# Patient Record
Sex: Male | Born: 1960 | Race: White | Hispanic: No | Marital: Single | Smoking: Light tobacco smoker
Health system: Southern US, Community
[De-identification: ages and names within clinical notes are randomized; demographics above are authoritative.]

## PROBLEM LIST (undated history)

## (undated) DIAGNOSIS — N183 Chronic kidney disease, stage 3 unspecified: Secondary | ICD-10-CM

## (undated) DIAGNOSIS — I639 Cerebral infarction, unspecified: Secondary | ICD-10-CM

## (undated) DIAGNOSIS — E119 Type 2 diabetes mellitus without complications: Secondary | ICD-10-CM

## (undated) DIAGNOSIS — Z720 Tobacco use: Secondary | ICD-10-CM

## (undated) DIAGNOSIS — Z86718 Personal history of other venous thrombosis and embolism: Secondary | ICD-10-CM

## (undated) DIAGNOSIS — I739 Peripheral vascular disease, unspecified: Secondary | ICD-10-CM

## (undated) DIAGNOSIS — Z87442 Personal history of urinary calculi: Secondary | ICD-10-CM

## (undated) DIAGNOSIS — R569 Unspecified convulsions: Secondary | ICD-10-CM

## (undated) DIAGNOSIS — I2510 Atherosclerotic heart disease of native coronary artery without angina pectoris: Secondary | ICD-10-CM

## (undated) DIAGNOSIS — E785 Hyperlipidemia, unspecified: Secondary | ICD-10-CM

## (undated) DIAGNOSIS — I219 Acute myocardial infarction, unspecified: Secondary | ICD-10-CM

## (undated) HISTORY — PX: STENT PLACEMENT VASCULAR (ARMC HX): HXRAD1737

## (undated) HISTORY — PX: FOOT SURGERY: SHX648

## (undated) HISTORY — DX: Peripheral vascular disease, unspecified: I73.9

## (undated) HISTORY — DX: Personal history of other venous thrombosis and embolism: Z86.718

## (undated) HISTORY — PX: TONSILLECTOMY: SUR1361

---

## 2005-08-13 ENCOUNTER — Encounter: Admission: RE | Admit: 2005-08-13 | Discharge: 2005-11-11 | Payer: Self-pay | Admitting: Internal Medicine

## 2007-06-26 DIAGNOSIS — I219 Acute myocardial infarction, unspecified: Secondary | ICD-10-CM

## 2007-06-26 HISTORY — DX: Acute myocardial infarction, unspecified: I21.9

## 2008-03-05 HISTORY — PX: CORONARY ANGIOPLASTY: SHX604

## 2012-06-25 HISTORY — PX: PERIPHERAL ARTERIAL STENT GRAFT: SHX2220

## 2017-02-10 ENCOUNTER — Encounter (HOSPITAL_COMMUNITY): Payer: Self-pay | Admitting: Emergency Medicine

## 2017-02-10 ENCOUNTER — Emergency Department (HOSPITAL_COMMUNITY)
Admission: EM | Admit: 2017-02-10 | Discharge: 2017-02-11 | Disposition: A | Discharge status: Home / Self Care | Payer: Managed Care, Other (non HMO) | Attending: Emergency Medicine | Admitting: Emergency Medicine

## 2017-02-10 ENCOUNTER — Emergency Department (HOSPITAL_COMMUNITY): Payer: Managed Care, Other (non HMO)

## 2017-02-10 DIAGNOSIS — M79671 Pain in right foot: Secondary | ICD-10-CM | POA: Diagnosis present

## 2017-02-10 DIAGNOSIS — F17210 Nicotine dependence, cigarettes, uncomplicated: Secondary | ICD-10-CM | POA: Diagnosis not present

## 2017-02-10 DIAGNOSIS — L97519 Non-pressure chronic ulcer of other part of right foot with unspecified severity: Secondary | ICD-10-CM | POA: Diagnosis not present

## 2017-02-10 DIAGNOSIS — G8929 Other chronic pain: Secondary | ICD-10-CM

## 2017-02-10 DIAGNOSIS — E08621 Diabetes mellitus due to underlying condition with foot ulcer: Secondary | ICD-10-CM | POA: Insufficient documentation

## 2017-02-10 HISTORY — DX: Type 2 diabetes mellitus without complications: E11.9

## 2017-02-10 LAB — CBC WITH DIFFERENTIAL/PLATELET
BASOS ABS: 0.1 10*3/uL (ref 0.0–0.1)
BASOS PCT: 1 %
EOS ABS: 0.5 10*3/uL (ref 0.0–0.7)
Eosinophils Relative: 5 %
HCT: 40.8 % (ref 39.0–52.0)
HEMOGLOBIN: 14.5 g/dL (ref 13.0–17.0)
Lymphocytes Relative: 26 %
Lymphs Abs: 2.8 10*3/uL (ref 0.7–4.0)
MCH: 31.3 pg (ref 26.0–34.0)
MCHC: 35.5 g/dL (ref 30.0–36.0)
MCV: 87.9 fL (ref 78.0–100.0)
Monocytes Absolute: 0.9 10*3/uL (ref 0.1–1.0)
Monocytes Relative: 8 %
NEUTROS ABS: 6.3 10*3/uL (ref 1.7–7.7)
NEUTROS PCT: 60 %
Platelets: 308 10*3/uL (ref 150–400)
RBC: 4.64 MIL/uL (ref 4.22–5.81)
RDW: 14.6 % (ref 11.5–15.5)
WBC: 10.6 10*3/uL — AB (ref 4.0–10.5)

## 2017-02-10 LAB — BASIC METABOLIC PANEL
ANION GAP: 8 (ref 5–15)
BUN: 23 mg/dL — ABNORMAL HIGH (ref 6–20)
CALCIUM: 9.1 mg/dL (ref 8.9–10.3)
CO2: 27 mmol/L (ref 22–32)
CREATININE: 1.45 mg/dL — AB (ref 0.61–1.24)
Chloride: 103 mmol/L (ref 101–111)
GFR, EST NON AFRICAN AMERICAN: 53 mL/min — AB (ref >60)
Glucose, Bld: 143 mg/dL — ABNORMAL HIGH (ref 65–99)
Potassium: 3.8 mmol/L (ref 3.5–5.1)
SODIUM: 138 mmol/L (ref 135–145)

## 2017-02-10 NOTE — ED Provider Notes (Signed)
[facility]-EMER[facility]ENCY DEPT Provider Note   CSN: [id number] Arrival date & time: 02/10/17  2023     History   Chief Complaint Chief Complaint  Patient presents with  . Foot Pain    HPI Curtis Bradford is a 56 y.o. male.  The history is provided by the patient and medical records. No language interpreter was used.   Curtis Bradford is a 56 y.o. male  with a PMH of DM who presents to the Emergency Department complaining of wound to his right foot. Patient has an extensive history with this right foot. He moved to [place] from [place] two days ago. He was followed by ID, wound care and orthopedics for the foot in [place]. He has an appointment with ortho on 8/23 here in [place] scheduled. He had his first surgery back in February and was seeing wound care once a week. On August 1st, he had another orthopedic surgery to the foot where he states a piece of bone was removed. The area was healing well. He would see wound care 3x per week and ID once a week to receive ABX through PICC line (piperacillin/tazobactam and daptomycin). Blood cx reports have all been negative. He states ABX were prophylactic to ensure wounds did not get infected. His last ABX through PICC was last week. Unfortunately, patient had to have PICC removed and has not found ID doctor in the area yet. Foot has been persistently swollen for the last 3 weeks. No new redness. Bleeding, but no purulent discharge. No fever or chills.   Past Medical History:  Diagnosis Date  . 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 DVT (deep vein thrombosis)     There are no active problems to display for this patient.   Past Surgical History:  Procedure Laterality Date  . FOOT SURGERY    . STENT PLACEMENT VASCULAR ([facility] HX)     Several in right leg       Home Medications    Prior to Admission medications   Not on File    Family History No family history on file.  Social History Social History  Substance Use  Topics  . Smoking status: Current Some Day Smoker  . Smokeless tobacco: Never Used     Comment: pack or less a week  . Alcohol use Yes     Comment: seldom     Allergies   Patient has no known allergies.   Review of Systems Review of Systems  Musculoskeletal: Positive for arthralgias.  Skin: Positive for wound. Negative for color change.  All other systems reviewed and are negative.    Physical Exam Updated Vital Signs BP (!) 141/77 (BP Location: Left Arm)   Pulse 87   Temp 97.8 F (36.6 C) (Oral)   Resp 20   Ht 6\' 3"  (1.905 m)   Wt 86.2 kg (190 lb)   SpO2 98%   BMI 23.75 kg/m   Physical Exam  Constitutional: He is oriented to person, place, and time. He appears well-developed and well-nourished. No distress.  Nontoxic appearing.  HENT:  Head: Normocephalic and atraumatic.  Neck: Neck supple.  Cardiovascular: Normal rate, regular rhythm and normal heart sounds.   No murmur heard. Pulmonary/Chest: Effort normal and breath sounds normal. No respiratory distress.  Musculoskeletal:  Right foot with full range of motion. He has an ulceration-like sore to the lateral plantar aspect of the foot as well as linear wound to the dorsal aspect of the foot. Mild amount  of associated swelling. No erythema or warmth. No active drainage/bleeding.  Neurological: He is alert and oriented to person, place, and time.  Skin: Skin is warm and dry.  Nursing note and vitals reviewed.    ED Treatments / Results  Labs (all labs ordered are listed, but only abnormal results are displayed) Labs Reviewed  BASIC METABOLIC PANEL - Abnormal; Notable for the following:       Result Value   Glucose, Bld 143 (*)    BUN 23 (*)    Creatinine, Ser 1.45 (*)    GFR calc non Af Amer 53 (*)    All other components within normal limits  CBC WITH DIFFERENTIAL/PLATELET - Abnormal; Notable for the following:    WBC 10.6 (*)    All other components within normal limits    EKG  EKG  Interpretation None       Radiology Dg Foot Complete Right  Result Date: 02/10/2017 CLINICAL DATA:  Persistent open wound. History of multiple foot surgeries, diabetes. EXAM: RIGHT FOOT COMPLETE - 3+ VIEW COMPARISON:  None. FINDINGS: Status post second through fifth metatarsal osteotomies, acute appearing second and third, chronic fourth and fifth. Chronic deformity base of fourth proximal phalanx. No acute fracture deformity. No dislocation. Bipartite first metatarsal tibial sesamoid. No destructive bony lesions. Soft tissue swelling. Tiny ulcer suspected plantar forefoot best seen on oblique view. No subcutaneous gas or radiopaque foreign bodies. Soft tissue swelling. Mild vascular calcifications. IMPRESSION: Status post second through fifth metatarsal osteotomies. Soft tissue swelling with suspected plantar forefoot ulcer. No radiographic findings of osteomyelitis though MRI is more sensitive. Electronically Signed   By: Awilda Metro M.D.   On: 02/10/2017 23:39    Procedures Procedures (including critical care time)  Medications Ordered in ED Medications - No data to display   Initial Impression / Assessment and Plan / ED Course  I have reviewed the triage vital signs and the nursing notes.  Pertinent labs & imaging results that were available during my care of the patient were reviewed by me and considered in my medical decision making (see chart for details).    Curtis Bradford is a 56 y.o. male who presents to ED for chronic right foot pain / wounds. Patient just relocated to [place] from [place] where he was receiving wound care and followed by infectious disease and orthopedics. He has an appointment with orthopedics in 3 days. While he definitely needs to keep appointment with orthopedics and continue wound care, patient is afebrile with no signs of active infection on exam. X-ray with no signs of osteomyelitis. Discussed reasons to return to the emergency department, the  patient is safe for discharge home with close orthopedic follow-up at this time. All questions answered.  Patient discussed with Dr. Lynelle Doctor who agrees with treatment plan.    Final Clinical Impressions(s) / ED Diagnoses   Final diagnoses:  Ulcer of right foot, unspecified ulcer stage (HCC)  Chronic pain in right foot    New Prescriptions New Prescriptions   No medications on file     Kharisma Glasner, Chase Picket, PA-C 02/11/17 4098    Linwood Dibbles, MD 02/11/17 2316

## 2017-02-10 NOTE — ED Notes (Signed)
Pt observed ambulating in hallway with a steady gait.  

## 2017-02-10 NOTE — ED Triage Notes (Signed)
Pt comes in with complaints of a wound on the side/bottom of his right foot. Hx of previous wound that needed operation.  Ortho doctor (during that operation) had noticed a bone protruding into his right foot.  Removed a piece of that bone.  States now the wound just won't quit bleeding.   Relocating here from [place]. Had a PICC line for antibiotic treatment but it was removed due to the move. The surgeon said from his side of things that the cultures were good and did not show any infection.

## 2017-02-11 NOTE — Discharge Instructions (Signed)
It was my pleasure taking care of you today!   Keep your appointment with Dr. Victorino Dike this week.  Return to the emergency department for fevers, new or worsening symptoms, any additional concerns.

## 2017-02-11 NOTE — ED Notes (Signed)
Foot cleansed and Xerofoam/dressing applied to right foot. Pt tolerated well. No active bleeding or drainage noted.

## 2017-02-14 ENCOUNTER — Encounter (HOSPITAL_COMMUNITY): Payer: Self-pay

## 2017-02-14 ENCOUNTER — Inpatient Hospital Stay (HOSPITAL_COMMUNITY)
Admission: EM | Admit: 2017-02-14 | Discharge: 2017-02-26 | DRG: 854 | Disposition: A | Discharge status: Home / Self Care | Payer: Managed Care, Other (non HMO) | Attending: Family Medicine | Admitting: Family Medicine

## 2017-02-14 ENCOUNTER — Emergency Department (HOSPITAL_COMMUNITY): Payer: Managed Care, Other (non HMO)

## 2017-02-14 DIAGNOSIS — I2510 Atherosclerotic heart disease of native coronary artery without angina pectoris: Secondary | ICD-10-CM | POA: Diagnosis present

## 2017-02-14 DIAGNOSIS — E11628 Type 2 diabetes mellitus with other skin complications: Secondary | ICD-10-CM | POA: Diagnosis present

## 2017-02-14 DIAGNOSIS — E1151 Type 2 diabetes mellitus with diabetic peripheral angiopathy without gangrene: Secondary | ICD-10-CM | POA: Diagnosis present

## 2017-02-14 DIAGNOSIS — L089 Local infection of the skin and subcutaneous tissue, unspecified: Secondary | ICD-10-CM | POA: Diagnosis present

## 2017-02-14 DIAGNOSIS — Z794 Long term (current) use of insulin: Secondary | ICD-10-CM

## 2017-02-14 DIAGNOSIS — M86271 Subacute osteomyelitis, right ankle and foot: Secondary | ICD-10-CM | POA: Diagnosis present

## 2017-02-14 DIAGNOSIS — Z79899 Other long term (current) drug therapy: Secondary | ICD-10-CM

## 2017-02-14 DIAGNOSIS — E1122 Type 2 diabetes mellitus with diabetic chronic kidney disease: Secondary | ICD-10-CM | POA: Diagnosis present

## 2017-02-14 DIAGNOSIS — Z792 Long term (current) use of antibiotics: Secondary | ICD-10-CM

## 2017-02-14 DIAGNOSIS — A419 Sepsis, unspecified organism: Principal | ICD-10-CM | POA: Diagnosis present

## 2017-02-14 DIAGNOSIS — Z7982 Long term (current) use of aspirin: Secondary | ICD-10-CM

## 2017-02-14 DIAGNOSIS — E11622 Type 2 diabetes mellitus with other skin ulcer: Secondary | ICD-10-CM | POA: Diagnosis present

## 2017-02-14 DIAGNOSIS — E785 Hyperlipidemia, unspecified: Secondary | ICD-10-CM | POA: Diagnosis present

## 2017-02-14 DIAGNOSIS — Z955 Presence of coronary angioplasty implant and graft: Secondary | ICD-10-CM

## 2017-02-14 DIAGNOSIS — E11621 Type 2 diabetes mellitus with foot ulcer: Secondary | ICD-10-CM | POA: Diagnosis present

## 2017-02-14 DIAGNOSIS — Z95820 Peripheral vascular angioplasty status with implants and grafts: Secondary | ICD-10-CM

## 2017-02-14 DIAGNOSIS — L97509 Non-pressure chronic ulcer of other part of unspecified foot with unspecified severity: Secondary | ICD-10-CM

## 2017-02-14 DIAGNOSIS — Z7902 Long term (current) use of antithrombotics/antiplatelets: Secondary | ICD-10-CM

## 2017-02-14 DIAGNOSIS — N179 Acute kidney failure, unspecified: Secondary | ICD-10-CM | POA: Diagnosis present

## 2017-02-14 DIAGNOSIS — E1169 Type 2 diabetes mellitus with other specified complication: Secondary | ICD-10-CM | POA: Diagnosis present

## 2017-02-14 DIAGNOSIS — G629 Polyneuropathy, unspecified: Secondary | ICD-10-CM

## 2017-02-14 DIAGNOSIS — Z86718 Personal history of other venous thrombosis and embolism: Secondary | ICD-10-CM | POA: Diagnosis not present

## 2017-02-14 DIAGNOSIS — I739 Peripheral vascular disease, unspecified: Secondary | ICD-10-CM | POA: Diagnosis not present

## 2017-02-14 DIAGNOSIS — E1142 Type 2 diabetes mellitus with diabetic polyneuropathy: Secondary | ICD-10-CM | POA: Diagnosis present

## 2017-02-14 DIAGNOSIS — G40909 Epilepsy, unspecified, not intractable, without status epilepticus: Secondary | ICD-10-CM

## 2017-02-14 DIAGNOSIS — R569 Unspecified convulsions: Secondary | ICD-10-CM

## 2017-02-14 DIAGNOSIS — N183 Chronic kidney disease, stage 3 unspecified: Secondary | ICD-10-CM

## 2017-02-14 DIAGNOSIS — G6289 Other specified polyneuropathies: Secondary | ICD-10-CM | POA: Diagnosis not present

## 2017-02-14 DIAGNOSIS — Z720 Tobacco use: Secondary | ICD-10-CM | POA: Diagnosis present

## 2017-02-14 DIAGNOSIS — Z79891 Long term (current) use of opiate analgesic: Secondary | ICD-10-CM | POA: Diagnosis not present

## 2017-02-14 DIAGNOSIS — E119 Type 2 diabetes mellitus without complications: Secondary | ICD-10-CM

## 2017-02-14 DIAGNOSIS — N182 Chronic kidney disease, stage 2 (mild): Secondary | ICD-10-CM | POA: Diagnosis present

## 2017-02-14 DIAGNOSIS — E1129 Type 2 diabetes mellitus with other diabetic kidney complication: Secondary | ICD-10-CM | POA: Diagnosis present

## 2017-02-14 DIAGNOSIS — L97519 Non-pressure chronic ulcer of other part of right foot with unspecified severity: Secondary | ICD-10-CM | POA: Diagnosis present

## 2017-02-14 DIAGNOSIS — F17210 Nicotine dependence, cigarettes, uncomplicated: Secondary | ICD-10-CM | POA: Diagnosis present

## 2017-02-14 DIAGNOSIS — M7989 Other specified soft tissue disorders: Secondary | ICD-10-CM

## 2017-02-14 DIAGNOSIS — M86279 Subacute osteomyelitis, unspecified ankle and foot: Secondary | ICD-10-CM | POA: Diagnosis not present

## 2017-02-14 DIAGNOSIS — Z89429 Acquired absence of other toe(s), unspecified side: Secondary | ICD-10-CM | POA: Diagnosis not present

## 2017-02-14 HISTORY — DX: Atherosclerotic heart disease of native coronary artery without angina pectoris: I25.10

## 2017-02-14 HISTORY — DX: Chronic kidney disease, stage 3 (moderate): N18.3

## 2017-02-14 HISTORY — DX: Unspecified convulsions: R56.9

## 2017-02-14 HISTORY — DX: Tobacco use: Z72.0

## 2017-02-14 HISTORY — DX: Hyperlipidemia, unspecified: E78.5

## 2017-02-14 HISTORY — DX: Chronic kidney disease, stage 3 unspecified: N18.30

## 2017-02-14 LAB — CBC WITH DIFFERENTIAL/PLATELET
BASOS PCT: 0 %
Basophils Absolute: 0.1 10*3/uL (ref 0.0–0.1)
EOS ABS: 0.2 10*3/uL (ref 0.0–0.7)
Eosinophils Relative: 1 %
HEMATOCRIT: 40 % (ref 39.0–52.0)
HEMOGLOBIN: 13.8 g/dL (ref 13.0–17.0)
Lymphocytes Relative: 4 %
Lymphs Abs: 0.5 10*3/uL — ABNORMAL LOW (ref 0.7–4.0)
MCH: 30.2 pg (ref 26.0–34.0)
MCHC: 34.5 g/dL (ref 30.0–36.0)
MCV: 87.5 fL (ref 78.0–100.0)
MONOS PCT: 6 %
Monocytes Absolute: 0.8 10*3/uL (ref 0.1–1.0)
NEUTROS ABS: 12.3 10*3/uL — AB (ref 1.7–7.7)
NEUTROS PCT: 89 %
Platelets: 276 10*3/uL (ref 150–400)
RBC: 4.57 MIL/uL (ref 4.22–5.81)
RDW: 14.6 % (ref 11.5–15.5)
WBC: 13.9 10*3/uL — AB (ref 4.0–10.5)

## 2017-02-14 LAB — URINALYSIS, ROUTINE W REFLEX MICROSCOPIC
BACTERIA UA: NONE SEEN
BILIRUBIN URINE: NEGATIVE
GLUCOSE, UA: 150 mg/dL — AB
KETONES UR: NEGATIVE mg/dL
LEUKOCYTES UA: NEGATIVE
NITRITE: NEGATIVE
PH: 5 (ref 5.0–8.0)
PROTEIN: 100 mg/dL — AB
Specific Gravity, Urine: 1.024 (ref 1.005–1.030)

## 2017-02-14 LAB — COMPREHENSIVE METABOLIC PANEL
ALK PHOS: 72 U/L (ref 38–126)
ALT: 16 U/L — ABNORMAL LOW (ref 17–63)
ANION GAP: 6 (ref 5–15)
AST: 14 U/L — ABNORMAL LOW (ref 15–41)
Albumin: 3.8 g/dL (ref 3.5–5.0)
BUN: 22 mg/dL — ABNORMAL HIGH (ref 6–20)
CALCIUM: 9 mg/dL (ref 8.9–10.3)
CHLORIDE: 103 mmol/L (ref 101–111)
CO2: 25 mmol/L (ref 22–32)
Creatinine, Ser: 1.44 mg/dL — ABNORMAL HIGH (ref 0.61–1.24)
GFR, EST NON AFRICAN AMERICAN: 54 mL/min — AB (ref >60)
Glucose, Bld: 213 mg/dL — ABNORMAL HIGH (ref 65–99)
Potassium: 4.2 mmol/L (ref 3.5–5.1)
SODIUM: 134 mmol/L — AB (ref 135–145)
Total Bilirubin: 0.8 mg/dL (ref 0.3–1.2)
Total Protein: 7.3 g/dL (ref 6.5–8.1)

## 2017-02-14 LAB — SEDIMENTATION RATE: Sed Rate: 31 mm/hr — ABNORMAL HIGH (ref 0–16)

## 2017-02-14 LAB — POCT I-STAT TROPONIN I: Troponin i, poc: 0.01 ng/mL (ref 0.00–0.08)

## 2017-02-14 LAB — GLUCOSE, CAPILLARY: GLUCOSE-CAPILLARY: 134 mg/dL — AB (ref 65–99)

## 2017-02-14 LAB — CG4 I-STAT (LACTIC ACID)
Lactic Acid, Venous: 1.21 mmol/L (ref 0.5–1.9)
Lactic Acid, Venous: 1.38 mmol/L (ref 0.5–1.9)

## 2017-02-14 LAB — C-REACTIVE PROTEIN: CRP: 12.3 mg/dL — AB (ref <1.0)

## 2017-02-14 MED ORDER — ASPIRIN EC 81 MG PO TBEC
81.0000 mg | DELAYED_RELEASE_TABLET | Freq: Every day | ORAL | Status: DC
  Administered 2017-02-15 – 2017-02-25 (×12): 81 mg via ORAL
  Filled 2017-02-14 (×12): qty 1

## 2017-02-14 MED ORDER — METRONIDAZOLE 500 MG PO TABS
500.0000 mg | ORAL_TABLET | Freq: Three times a day (TID) | ORAL | Status: DC
  Administered 2017-02-14: 500 mg via ORAL
  Filled 2017-02-14: qty 1

## 2017-02-14 MED ORDER — ATORVASTATIN CALCIUM 10 MG PO TABS
20.0000 mg | ORAL_TABLET | Freq: Every day | ORAL | Status: DC
  Administered 2017-02-15 – 2017-02-25 (×11): 20 mg via ORAL
  Filled 2017-02-14 (×11): qty 2

## 2017-02-14 MED ORDER — INSULIN GLARGINE 100 UNIT/ML ~~LOC~~ SOLN
25.0000 [IU] | Freq: Every day | SUBCUTANEOUS | Status: DC
  Administered 2017-02-15 – 2017-02-19 (×5): 25 [IU] via SUBCUTANEOUS
  Filled 2017-02-14 (×6): qty 0.25

## 2017-02-14 MED ORDER — HYDROCODONE-ACETAMINOPHEN 5-325 MG PO TABS
1.0000 | ORAL_TABLET | Freq: Three times a day (TID) | ORAL | Status: DC | PRN
  Administered 2017-02-15 – 2017-02-25 (×7): 1 via ORAL
  Filled 2017-02-14 (×8): qty 1

## 2017-02-14 MED ORDER — SILVER SULFADIAZINE 1 % EX CREA
1.0000 "application " | TOPICAL_CREAM | Freq: Every day | CUTANEOUS | Status: DC
  Administered 2017-02-15 – 2017-02-23 (×10): 1 via TOPICAL
  Filled 2017-02-14: qty 50
  Filled 2017-02-14: qty 85

## 2017-02-14 MED ORDER — CLOPIDOGREL BISULFATE 75 MG PO TABS
75.0000 mg | ORAL_TABLET | Freq: Every day | ORAL | Status: DC
  Administered 2017-02-15 – 2017-02-17 (×3): 75 mg via ORAL
  Filled 2017-02-14 (×3): qty 1

## 2017-02-14 MED ORDER — ACETAMINOPHEN 500 MG PO TABS
1000.0000 mg | ORAL_TABLET | Freq: Once | ORAL | Status: AC
  Administered 2017-02-14: 1000 mg via ORAL
  Filled 2017-02-14: qty 2

## 2017-02-14 MED ORDER — SODIUM CHLORIDE 0.9 % IV BOLUS (SEPSIS)
2000.0000 mL | Freq: Once | INTRAVENOUS | Status: AC
  Administered 2017-02-14: 2000 mL via INTRAVENOUS

## 2017-02-14 MED ORDER — ZOLPIDEM TARTRATE 5 MG PO TABS
5.0000 mg | ORAL_TABLET | Freq: Every evening | ORAL | Status: DC | PRN
  Administered 2017-02-16: 5 mg via ORAL
  Filled 2017-02-14: qty 1

## 2017-02-14 MED ORDER — VANCOMYCIN HCL IN DEXTROSE 1-5 GM/200ML-% IV SOLN
1000.0000 mg | Freq: Once | INTRAVENOUS | Status: AC
  Administered 2017-02-14: 1000 mg via INTRAVENOUS
  Filled 2017-02-14: qty 200

## 2017-02-14 MED ORDER — ACETAMINOPHEN 650 MG RE SUPP: 650.0000 mg | Freq: Four times a day (QID) | RECTAL | Status: DC | PRN

## 2017-02-14 MED ORDER — ACETAMINOPHEN 325 MG PO TABS
650.0000 mg | ORAL_TABLET | Freq: Four times a day (QID) | ORAL | Status: DC | PRN
  Administered 2017-02-15 – 2017-02-16 (×2): 650 mg via ORAL
  Filled 2017-02-14 (×2): qty 2

## 2017-02-14 MED ORDER — DEXTROSE 5 % IV SOLN
2.0000 g | INTRAVENOUS | Status: DC
  Administered 2017-02-14: 2 g via INTRAVENOUS
  Filled 2017-02-14: qty 2

## 2017-02-14 MED ORDER — ONDANSETRON HCL 4 MG/2ML IJ SOLN: 4.0000 mg | Freq: Three times a day (TID) | INTRAMUSCULAR | Status: DC | PRN

## 2017-02-14 MED ORDER — NICOTINE 21 MG/24HR TD PT24
21.0000 mg | MEDICATED_PATCH | Freq: Every day | TRANSDERMAL | Status: DC
  Filled 2017-02-14 (×8): qty 1

## 2017-02-14 MED ORDER — FLUCONAZOLE 100 MG PO TABS
100.0000 mg | ORAL_TABLET | Freq: Every day | ORAL | Status: DC
  Administered 2017-02-15 – 2017-02-18 (×4): 100 mg via ORAL
  Filled 2017-02-14 (×4): qty 1

## 2017-02-14 MED ORDER — INSULIN ASPART 100 UNIT/ML ~~LOC~~ SOLN
0.0000 [IU] | Freq: Three times a day (TID) | SUBCUTANEOUS | Status: DC
  Administered 2017-02-15: 2 [IU] via SUBCUTANEOUS
  Administered 2017-02-15: 1 [IU] via SUBCUTANEOUS
  Administered 2017-02-15 – 2017-02-17 (×4): 2 [IU] via SUBCUTANEOUS
  Administered 2017-02-17: 1 [IU] via SUBCUTANEOUS
  Administered 2017-02-18: 2 [IU] via SUBCUTANEOUS

## 2017-02-14 MED ORDER — LEVETIRACETAM 500 MG PO TABS
1000.0000 mg | ORAL_TABLET | Freq: Every day | ORAL | Status: DC
  Administered 2017-02-15 – 2017-02-25 (×12): 1000 mg via ORAL
  Filled 2017-02-14 (×12): qty 2

## 2017-02-14 MED ORDER — HEPARIN SODIUM (PORCINE) 5000 UNIT/ML IJ SOLN
5000.0000 [IU] | Freq: Three times a day (TID) | INTRAMUSCULAR | Status: DC
  Filled 2017-02-14 (×5): qty 1

## 2017-02-14 MED ORDER — SODIUM CHLORIDE 0.9 % IV SOLN
INTRAVENOUS | Status: DC
  Administered 2017-02-15 – 2017-02-16 (×3): via INTRAVENOUS

## 2017-02-14 NOTE — Progress Notes (Signed)
Pharmacy Antibiotic Note  Curtis Bradford is a 56 y.o. male with right foot infection admitted on 02/14/2017 with diabetic foot ulcer with infection.  Pharmacy has been consulted for zosyn and vancomycin dosing.  Plan: Zosyn 3.375g IV q8h (4 hour infusion).  Vancomycin 1 Gm IV q12h  VT=15-20 mg/L Daily Scr F/u cultures and levels  Height: 6\' 3"  (190.5 cm) Weight: 190 lb (86.2 kg) IBW/kg (Calculated) : 84.5  Temp (24hrs), Avg:100.4 F (38 C), Min:99.7 F (37.6 C), Max:101.7 F (38.7 C)   Recent Labs Lab 02/10/17 2325 02/14/17 1627 02/14/17 1643 02/14/17 2206  WBC 10.6* 13.9*  --   --   CREATININE 1.45* 1.44*  --   --   LATICACIDVEN  --   --  1.21 1.38    Estimated Creatinine Clearance: 70.1 mL/min (A) (by C-G formula based on SCr of 1.44 mg/dL (H)).    No Known Allergies  Antimicrobials this admission: 8/23 rocephin and flagyl >> x1 ED 8/23 vancomycin >>  8/24 zosyn >>  Dose adjustments this admission:   Microbiology results:  BCx:   UCx:    Sputum:    MRSA PCR:   Thank you for allowing pharmacy to be a part of this patient's care.  Lorenza Evangelist 02/14/2017 11:59 PM

## 2017-02-14 NOTE — ED Provider Notes (Signed)
[facility] Provider Note   CSN: [id number] Arrival date & time: 02/14/17  1544     History   Chief Complaint Chief Complaint  Patient presents with  . Foot Pain  . Wound Infection    HPI Curtis Bradford is a 56 y.o. male who presents with c/o fR foot infection. He has a Complicated PMH and was Followed by both infectious disease and orthopedics with multiple surgeries and debridements to the right foot for the past 4 months. The patient states he has been out of work and that is why he had to move to [place]. The patient was receiving daptomycin and Zosyn in a PICC line prior to arrival. The patient moved here one week ago. He states that he had to sign an AMA prior to her prior to coming to [place], but that he had to leave work. The patient was seen in the ER 4 days ago because of his foot ulceration. Patient states that in the past few days. His foot has become significantly more red, swollen and tender. He has some pain, however, has minimal feeling in the foot secondary to diabetic neuropathy. Eyes, fevers or chills.  HPI  Past Medical History:  Diagnosis Date  . 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 DVT (deep vein thrombosis) in pregnancy (HCC)     There are no active problems to display for this patient.   Past Surgical History:  Procedure Laterality Date  . FOOT SURGERY    . STENT PLACEMENT VASCULAR ([facility] HX)     Several in right leg       Home Medications    Prior to Admission medications   Medication Sig Start Date End Date Taking? Authorizing Provider  aspirin EC 81 MG tablet Take 81 mg by mouth at bedtime.   Yes [provider]  atorvastatin (LIPITOR) 20 MG tablet Take 20 mg by mouth at bedtime.   Yes [provider]  clopidogrel (PLAVIX) 75 MG tablet Take 75 mg by mouth at bedtime.   Yes [provider]  HYDROcodone-acetaminophen (NORCO/VICODIN) 5-325 MG tablet Take 1 tablet by mouth 3 (three)  times daily as needed for moderate pain.   Yes [provider]  insulin glargine (LANTUS) 100 unit/mL SOPN Inject 37 Units into the skin at bedtime.   Yes [provider]  Insulin Lispro (HUMALOG KWIKPEN [place]) Inject 0-15 Units into the skin 2 (two) times daily. Per sliding scale   Yes [provider]  levETIRAcetam (KEPPRA) 1000 MG tablet Take 1,000 mg by mouth at bedtime.   Yes [provider]  linagliptin (TRADJENTA) 5 MG TABS tablet Take 5 mg by mouth daily.   Yes [provider]  silver sulfADIAZINE (SILVADENE) 1 % cream Apply 1 application topically at bedtime.   Yes [provider]    Family History History reviewed. No pertinent family history.  Social History Social History  Substance Use Topics  . Smoking status: Current Some Day Smoker  . Smokeless tobacco: Never Used     Comment: pack or less a week  . Alcohol use Yes     Comment: seldom     Allergies   Patient has no known allergies.   Review of Systems Review of Systems Ten systems reviewed and are negative for acute change, except as noted in the HPI.    Physical Exam Updated Vital Signs BP (!) 152/93 (BP Location: Left Arm)   Pulse (!) 106   Temp  99.9 F (37.7 C) (Oral)   Resp 18   Ht 6\' 3"  (1.905 m)   Wt 86.2 kg (190 lb)   SpO2 97%   BMI 23.75 kg/m   Physical Exam  Constitutional: He appears well-developed and well-nourished. No distress.  HENT:  Head: Normocephalic and atraumatic.  Eyes: Conjunctivae are normal. No scleral icterus.  Neck: Normal range of motion. Neck supple.  Cardiovascular: Normal rate, regular rhythm and normal heart sounds.   Pulmonary/Chest: Effort normal and breath sounds normal. No respiratory distress.  Abdominal: Soft. There is no tenderness.  Musculoskeletal: He exhibits no edema.  Patient with Swelling, heat, erythema. He has drainage from the Ulcer on his plantar surface and surface with crepitus Macerated fungal  infection is present in between the toes. 2nd toenail missing  Feet:  Right Foot:  Skin Integrity: Positive for ulcer, skin breakdown, erythema and warmth.  Neurological: He is alert.  Skin: Skin is warm and dry. He is not diaphoretic.  Psychiatric: His behavior is normal.  Nursing note and vitals reviewed.            ED Treatments / Results  Labs (all labs ordered are listed, but only abnormal results are displayed) Labs Reviewed  COMPREHENSIVE METABOLIC PANEL - Abnormal; Notable for the following:       Result Value   Sodium 134 (*)    Glucose, Bld 213 (*)    BUN 22 (*)    Creatinine, Ser 1.44 (*)    AST 14 (*)    ALT 16 (*)    GFR calc non Af Amer 54 (*)    All other components within normal limits  CBC WITH DIFFERENTIAL/PLATELET - Abnormal; Notable for the following:    WBC 13.9 (*)    Neutro Abs 12.3 (*)    Lymphs Abs 0.5 (*)    All other components within normal limits  URINALYSIS, ROUTINE W REFLEX MICROSCOPIC - Abnormal; Notable for the following:    Glucose, UA 150 (*)    Hgb urine dipstick MODERATE (*)    Protein, ur 100 (*)    Squamous Epithelial / LPF 0-5 (*)    All other components within normal limits  I-STAT CG4 LACTIC ACID, ED  CG4 I-STAT (LACTIC ACID)  I-STAT CG4 LACTIC ACID, ED    EKG  EKG Interpretation None       Radiology Dg Chest 2 View  Result Date: 02/14/2017 CLINICAL DATA:  Right foot wound. EXAM: CHEST  2 VIEW COMPARISON:  None. FINDINGS: The heart size and mediastinal contours are within normal limits. Both lungs are clear. The visualized skeletal structures are unremarkable. IMPRESSION: No active cardiopulmonary disease. Electronically Signed   By: Gerome Sam III M.D   On: 02/14/2017 17:17    Procedures Procedures (including critical care time)  Medications Ordered in ED Medications - No data to display   Initial Impression / Assessment and Plan / ED Course  I have reviewed the triage vital signs and the nursing  notes.  Pertinent labs & imaging results that were available during my care of the patient were reviewed by me and considered in my medical decision making (see chart for details).     Pt with diabetic foot infection.  ? Osteomyelitis. He will need MRI tomorrow. I have consulted with the Pharmacist, guarding, coverage for his foot wound. Patient will be admitted. He will need a rule out for osteomyelitis with MRIs available in the morning. He is stable throughout his ED course. He did  develop fever. Fever treated with Tylenol, fluids.  Final Clinical Impressions(s) / ED Diagnoses   Final diagnoses:  Diabetes mellitus without complication (HCC)  Hyperlipidemia, unspecified hyperlipidemia type  Tobacco abuse  CKD (chronic kidney disease), stage III    New Prescriptions New Prescriptions   No medications on file     Arthor Captain, PA-C 02/14/17 2325    Mancel Bale, MD 02/15/17 [phone]

## 2017-02-14 NOTE — ED Notes (Signed)
Pt had drawn in triage for labs: Gold Blue Lavender Lt green  Dark green 

## 2017-02-14 NOTE — ED Triage Notes (Signed)
Patient comes in with complaints of pain to right foot wound. Patient has history of diabetes and states "it just wont heal, and now its more swollen than before." Patient reports chills, low grade fever at home, weakness, and pain. Patient concerned for infection. Patient was seen at [facility] earlier this week for the wound.

## 2017-02-14 NOTE — H&P (Addendum)
History and Physical    Curtis Bradford RFF:[id number] DOB: [birth year] DOA: 02/14/2017  Referring MD/NP/PA:   PCP: Patient, No Pcp Per   Patient coming from:  The patient is coming from home.  At baseline, pt is independent for most of ADL.   Chief Complaint: Right foot ulcer with infection, fever, chills  HPI: Curtis Bradford is a 56 y.o. male with medical history significant of diabetes mellitus, hyperlipidemia, tobacco abuse, CK-3, seizure, CAD, stent placement, possible right leg PVD (s/p of 5 stent placement per pt), who presents with right foot also with infection, fever and chills.  Pt states that he has right foot ulcers with infection in the past 4 months. He was ollowed by both infectious disease and orthopedics with multiple surgeries and debridements to the right foot in [facility] [place]. Last surgery was on last Monday. Pt was on IV daptomycin and Zosyn via PICC line. The patient moved here one week ago. He states that he had to sign an AMA prior to coming to [facility] for work. His PICC line was removed. That time, he did not have fever or chills. Patient states that in the past few days. His foot has become significantly more red, swollen and tender. He also developed fever and chills. His pain is constant, 7 out of 10 in severity, sharp, nonradiating.  Patient does not have chest pain, shortness breath, cough. He has nausea, no vomiting, diarrhea, abdominal pain, symptoms of UTI or unilateral weakness. He has generalized weakness. His left lower leg is swollen chronically.  ED Course: pt was found to have WBC 16.9, lactic acid 1.21, 1.38, negative urinalysis, stable renal function, temperature 101.7, tachycardia, oxygen saturation 96% on room air, negative chest x-ray. X-ray of the right foot did not show clear evidence of osteomyelitis. [name] admitted to telemetry bed as inpatient.  Review of Systems:   General: has fevers, chills, no body weight gain, has  fatigue HEENT: no blurry vision, hearing changes or sore throat Respiratory: no dyspnea, coughing, wheezing CV: no chest pain, no palpitations GI: no nausea, vomiting, abdominal pain, diarrhea, constipation GU: no dysuria, burning on urination, increased urinary frequency, hematuria  Ext: right leg edema Neuro: no unilateral weakness, numbness, or tingling, no vision change or hearing loss Skin: has right foot ulcer MSK: No muscle spasm, no deformity, no limitation of range of movement in spin Heme: No easy bruising.  Travel history: No recent long distant travel.  Allergy: No Known Allergies  Past Medical History:  Diagnosis Date  . CAD (coronary artery disease)   . CKD (chronic kidney disease), stage III   . 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 DVT (deep vein thrombosis) in pregnancy ([place])   . HLD (hyperlipidemia)   . Seizure ([place])   . Tobacco abuse     Past Surgical History:  Procedure Laterality Date  . FOOT SURGERY    . STENT PLACEMENT VASCULAR ([place] HX)     Several in right leg    Social History:  reports that he has been smoking.  He has never used smokeless tobacco. He reports that he drinks alcohol. He reports that he does not use drugs.  Family History:  Family History  Problem Relation Age of Onset  . Diabetes Mellitus II Mother   . Diabetes Mellitus II Father      Prior to Admission medications   Medication Sig Start Date End Date Taking? Authorizing Provider  aspirin EC 81 MG  tablet Take 81 mg by mouth at bedtime.   Yes [provider]  atorvastatin (LIPITOR) 20 MG tablet Take 20 mg by mouth at bedtime.   Yes [provider]  clopidogrel (PLAVIX) 75 MG tablet Take 75 mg by mouth at bedtime.   Yes [provider]  HYDROcodone-acetaminophen (NORCO/VICODIN) 5-325 MG tablet Take 1 tablet by mouth 3 (three) times daily as needed for moderate pain.   Yes [provider]  insulin glargine (LANTUS) 100  unit/mL SOPN Inject 37 Units into the skin at bedtime.   Yes [provider]  Insulin Lispro (HUMALOG KWIKPEN [place]) Inject 0-15 Units into the skin 2 (two) times daily. Per sliding scale   Yes [provider]  levETIRAcetam (KEPPRA) 1000 MG tablet Take 1,000 mg by mouth at bedtime.   Yes [provider]  linagliptin (TRADJENTA) 5 MG TABS tablet Take 5 mg by mouth daily.   Yes [provider]  silver sulfADIAZINE (SILVADENE) 1 % cream Apply 1 application topically at bedtime.   Yes [provider]    Physical Exam: Vitals:   02/14/17 1942 02/14/17 2143 02/14/17 2208 02/14/17 2330  BP: (!) 152/93  (!) 148/99 (!) 110/57  Pulse: (!) 106 (!) 110 (!) 122 (!) 103  Resp: 18  16 (!) 23  Temp:  (!) 101.7 F (38.7 C) 100.2 F (37.9 C) 99.7 F (37.6 C)  TempSrc:  Oral Oral Oral  SpO2: 97%  96% 95%  Weight:      Height:       General: Not in acute distress HEENT:       Eyes: PERRL, EOMI, no scleral icterus.       ENT: No discharge from the ears and nose, no pharynx injection, no tonsillar enlargement.        Neck: No JVD, no bruit, no mass felt. Heme: No neck lymph node enlargement. Cardiac: S1/S2, RRR, No murmurs, No gallops or rubs. Respiratory: No rales, wheezing, rhonchi or rubs. GI: Soft, nondistended, nontender, no rebound pain, no organomegaly, BS present. GU: No hematuria Ext: No pitting leg edema bilaterally. 1+DP/PT pulse bilaterally. Musculoskeletal: No joint deformities, No joint redness or warmth, no limitation of ROM in spin. Skin: Has selling, warmth, erythema in right foot, has drainage from ulcer on his right lateral plantar surface. Possible macerated fungal infection is present in between the toes. 2nd toenail missing  Neuro: Alert, oriented X3, cranial nerves II-XII grossly intact, moves all extremities normally. Psych: [name] not psychotic, no suicidal or hemocidal ideation.  Labs on Admission: I have personally reviewed  following labs and imaging studies  CBC:  Recent Labs Lab 02/10/17 2325 02/14/17 1627  WBC 10.6* 13.9*  NEUTROABS 6.3 12.3*  HGB 14.5 13.8  HCT 40.8 40.0  MCV 87.9 87.5  PLT 308 182   Basic Metabolic Panel:  Recent Labs Lab 02/10/17 2325 02/14/17 1627  NA 138 134*  K 3.8 4.2  CL 103 103  CO2 27 25  GLUCOSE 143* 213*  BUN 23* 22*  CREATININE 1.45* 1.44*  CALCIUM 9.1 9.0   GFR: Estimated Creatinine Clearance: 70.1 mL/min (A) (by C-G formula based on SCr of 1.44 mg/dL (H)). Liver Function Tests:  Recent Labs Lab 02/14/17 1627  AST 14*  ALT 16*  ALKPHOS 72  BILITOT 0.8  PROT 7.3  ALBUMIN 3.8   No results for input(s): LIPASE, AMYLASE in the last 168 hours. No results for input(s): AMMONIA in the last 168 hours. Coagulation Profile: No results  for input(s): INR, PROTIME in the last 168 hours. Cardiac Enzymes: No results for input(s): CKTOTAL, CKMB, CKMBINDEX, TROPONINI in the last 168 hours. BNP (last 3 results) No results for input(s): PROBNP in the last 8760 hours. HbA1C: No results for input(s): HGBA1C in the last 72 hours. CBG:  Recent Labs Lab 02/14/17 2151  GLUCAP 134*   Lipid Profile: No results for input(s): CHOL, HDL, LDLCALC, TRIG, CHOLHDL, LDLDIRECT in the last 72 hours. Thyroid Function Tests: No results for input(s): TSH, T4TOTAL, FREET4, T3FREE, THYROIDAB in the last 72 hours. Anemia Panel: No results for input(s): VITAMINB12, FOLATE, FERRITIN, TIBC, IRON, RETICCTPCT in the last 72 hours. Urine analysis:    Component Value Date/Time   COLORURINE YELLOW 02/14/2017 1942   APPEARANCEUR CLEAR 02/14/2017 1942   LABSPEC 1.024 02/14/2017 1942   PHURINE 5.0 02/14/2017 1942   GLUCOSEU 150 (A) 02/14/2017 1942   HGBUR MODERATE (A) 02/14/2017 1942   BILIRUBINUR NEGATIVE 02/14/2017 1942   KETONESUR NEGATIVE 02/14/2017 1942   PROTEINUR 100 (A) 02/14/2017 1942   NITRITE NEGATIVE 02/14/2017 1942   LEUKOCYTESUR NEGATIVE 02/14/2017 1942    Sepsis Labs: '@LABRCNTIP'$ (procalcitonin:4,lacticidven:4) )No results found for this or any previous visit (from the past 240 hour(s)).   Radiological Exams on Admission: Dg Chest 2 View  Result Date: 02/14/2017 CLINICAL DATA:  Right foot wound. EXAM: CHEST  2 VIEW COMPARISON:  None. FINDINGS: The heart size and mediastinal contours are within normal limits. Both lungs are clear. The visualized skeletal structures are unremarkable. IMPRESSION: No active cardiopulmonary disease. Electronically Signed   By: Dorise Bullion III M.D   On: 02/14/2017 17:17   Dg Foot Complete Right  Result Date: 02/14/2017 CLINICAL DATA:  Patient with nonhealing wound to the right foot. Fever. EXAM: RIGHT FOOT COMPLETE - 3+ VIEW COMPARISON:  Foot radiograph 02/10/2017 FINDINGS: Patient status post second through fifth metatarsal ostomies. There is soft tissue swelling and focus of soft tissue gas at the lateral foot near the mid aspect of the fifth metatarsal. There appears be overlying soft tissue ulceration at this location. No evidence for acute fracture or dislocation. No acute appearing osseous abnormality. IMPRESSION: Suggestion of soft tissue ulceration about the lateral aspect of the foot with underlying soft tissue gas, progressed from prior, concerning for worsening infectious process. No frank evidence for osteomyelitis although MRI is more sensitive. Electronically Signed   By: Lovey Newcomer M.D.   On: 02/14/2017 21:19     EKG:  Not done in ED, will get one.   Assessment/Plan Principal Problem:   Diabetic foot infection ([place]) Active Problems:   Type II diabetes mellitus with renal manifestations (HCC)   HLD (hyperlipidemia)   Tobacco abuse   CKD (chronic kidney disease), stage III   Sepsis (HCC)   Type 2 diabetes mellitus with diabetic foot ulcer (HCC)   CAD (coronary artery disease)   Seizure ([place])   Diabetic foot ulcer with infection and sepsis: Patient meets criteria for sepsis with  tachycardia, leukocytosis, fever. Lactic acid is normal. Hemodynamically stable. Patient was on IV daptomycin and Zosyn before, not clear why patient was on daptomycin instead of vancomycin-->is this because of CKD? Patient states that the culture done in previous health center was negative. Now his GFR is 54, should be okay to use vancomycin.  - will admit to tele bed as inpt - Empiric antimicrobial treatment with vancomycin and Zosyn per pharmacy (patient received one dose of Flagyl and Rocephin in ED) - PRN Zofran for nausea, Norco for pain -  Blood cultures x 2  - ESR and CRP - wound care consult - MRI-right foot - will get Procalcitonin and trend lactic acid levels per sepsis protocol. 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 [name] taking Humalog, Lantus and Tradjenta at home -will decrease Lantus dose from 37 to 25 U daily -SSI -Check A1c  HLD: -lipitor  CKD-III: Creatinine was 1.45 on 02/10/17, his creatinine is 1.44, BUN 22 today. Stable. -Follow-up renal function by BMP  Hx of CAD: s/p of stent. No CP -continue aspirin, Plavix, Lipitor  Tobacco abuse: -Did counseling about importance of quitting smoking -Nicotine patch  Seizure ([place]): last seizure was 2 years ago -continue Keppra -Seizure precaution  DVT ppx: SQ Heparin     Code Status: Full code Family Communication: None at bed side.     Disposition Plan:  Anticipate discharge back to previous home environment Consults called:  none Admission status:  Inpatient/tele          Date of Service 02/14/2017    Ivor Costa [facility] Pager [phone]  If 7PM-7AM, please contact night-coverage [email or web address] Password [facility] 02/14/2017, 11:56 PM

## 2017-02-14 NOTE — ED Notes (Signed)
Pt states that he had surgery on RT foot on 01/23/17 and orthopedic had removed some bones on his foot. Pt is a diabetic. Rt foot appears red and warm, pain 8/10 dull, dressing removed to assess and mild brown foul smelling discharge is noted. Pt ankle is swollen. Pt does wound care dressing 2 x daily

## 2017-02-14 NOTE — Progress Notes (Signed)
A consult was received from an ED physician for vancomycin per pharmacy dosing.  The patient's profile has been reviewed for ht/wt/allergies/indication/available labs.   A one time order has been placed for Vancomycin 1 Gm.  Further antibiotics/pharmacy consults should be ordered by admitting physician if indicated.                       Thank you, Lorenza Evangelist 02/14/2017 10:18 PM

## 2017-02-14 NOTE — ED Provider Notes (Signed)
  Face-to-face evaluation   History: He presents for evaluation of right foot pain, with concern for infection.  He recently transitioned to [place], from [place] where he was receiving wound care and IV antibiotics for foot infection.  He states that he originally had removal of distal right metatarsals 3, 4 and 5 because of a poorly healing plantar wound.  He subsequently had a debridement of the area near the fifth metacarpal, about 2 weeks ago.  Since moving to [facility] his been able to work, but has to stand and walk more than usual so the right foot is hurting.  He also reports having fever, last few days.  Physical exam: Alert, calm, cooperative.  Right foot, with postoperative wounds which appear to be healing.  There are also 2 plantar aspect lacerations, all wounds are without active drainage or bleeding.  There is mild erythema of the right dorsal forefoot, without fluctuance.  There is no proximal streaking  Medical screening examination/treatment/procedure(s) were conducted as a shared visit with non-physician practitioner(s) and myself.  I personally evaluated the patient during the encounter   Mancel Bale, MD 02/15/17 [phone]

## 2017-02-15 ENCOUNTER — Inpatient Hospital Stay (HOSPITAL_COMMUNITY): Payer: Managed Care, Other (non HMO)

## 2017-02-15 ENCOUNTER — Encounter (HOSPITAL_COMMUNITY): Payer: Managed Care, Other (non HMO)

## 2017-02-15 DIAGNOSIS — I739 Peripheral vascular disease, unspecified: Secondary | ICD-10-CM

## 2017-02-15 DIAGNOSIS — M7989 Other specified soft tissue disorders: Secondary | ICD-10-CM

## 2017-02-15 LAB — HEMOGLOBIN A1C
Hgb A1c MFr Bld: 7 % — ABNORMAL HIGH (ref 4.8–5.6)
MEAN PLASMA GLUCOSE: 154.2 mg/dL

## 2017-02-15 LAB — COMPREHENSIVE METABOLIC PANEL
ALK PHOS: 59 U/L (ref 38–126)
ALT: 19 U/L (ref 17–63)
AST: 15 U/L (ref 15–41)
Albumin: 3 g/dL — ABNORMAL LOW (ref 3.5–5.0)
Anion gap: 8 (ref 5–15)
BILIRUBIN TOTAL: 0.7 mg/dL (ref 0.3–1.2)
BUN: 18 mg/dL (ref 6–20)
CALCIUM: 7.9 mg/dL — AB (ref 8.9–10.3)
CHLORIDE: 106 mmol/L (ref 101–111)
CO2: 20 mmol/L — ABNORMAL LOW (ref 22–32)
CREATININE: 1.38 mg/dL — AB (ref 0.61–1.24)
GFR calc Af Amer: >60 mL/min (ref >60)
GFR, EST NON AFRICAN AMERICAN: 57 mL/min — AB (ref >60)
Glucose, Bld: 147 mg/dL — ABNORMAL HIGH (ref 65–99)
Potassium: 3.6 mmol/L (ref 3.5–5.1)
Sodium: 134 mmol/L — ABNORMAL LOW (ref 135–145)
TOTAL PROTEIN: 6 g/dL — AB (ref 6.5–8.1)

## 2017-02-15 LAB — RAPID URINE DRUG SCREEN, HOSP PERFORMED
Amphetamines: NOT DETECTED
Barbiturates: NOT DETECTED
Benzodiazepines: NOT DETECTED
Cocaine: NOT DETECTED
OPIATES: POSITIVE — AB
Tetrahydrocannabinol: NOT DETECTED

## 2017-02-15 LAB — GLUCOSE, CAPILLARY
GLUCOSE-CAPILLARY: 145 mg/dL — AB (ref 65–99)
GLUCOSE-CAPILLARY: 162 mg/dL — AB (ref 65–99)
Glucose-Capillary: 162 mg/dL — ABNORMAL HIGH (ref 65–99)
Glucose-Capillary: 163 mg/dL — ABNORMAL HIGH (ref 65–99)

## 2017-02-15 LAB — CBC
HCT: 35.1 % — ABNORMAL LOW (ref 39.0–52.0)
Hemoglobin: 11.9 g/dL — ABNORMAL LOW (ref 13.0–17.0)
MCH: 29.6 pg (ref 26.0–34.0)
MCHC: 33.9 g/dL (ref 30.0–36.0)
MCV: 87.3 fL (ref 78.0–100.0)
PLATELETS: 226 10*3/uL (ref 150–400)
RBC: 4.02 MIL/uL — ABNORMAL LOW (ref 4.22–5.81)
RDW: 14.4 % (ref 11.5–15.5)
WBC: 11.6 10*3/uL — AB (ref 4.0–10.5)

## 2017-02-15 LAB — LACTIC ACID, PLASMA: LACTIC ACID, VENOUS: 0.8 mmol/L (ref 0.5–1.9)

## 2017-02-15 LAB — APTT: aPTT: 38 seconds — ABNORMAL HIGH (ref 24–36)

## 2017-02-15 LAB — PREALBUMIN: PREALBUMIN: 13.8 mg/dL — AB (ref 18–38)

## 2017-02-15 LAB — HIV ANTIBODY (ROUTINE TESTING W REFLEX): HIV SCREEN 4TH GENERATION: NONREACTIVE

## 2017-02-15 LAB — PROTIME-INR
INR: 1.19
Prothrombin Time: 15.2 seconds (ref 11.4–15.2)

## 2017-02-15 LAB — PROCALCITONIN: Procalcitonin: 0.42 ng/mL

## 2017-02-15 MED ORDER — VANCOMYCIN HCL IN DEXTROSE 1-5 GM/200ML-% IV SOLN
1000.0000 mg | Freq: Two times a day (BID) | INTRAVENOUS | Status: DC
  Administered 2017-02-15 – 2017-02-20 (×12): 1000 mg via INTRAVENOUS
  Filled 2017-02-15 (×13): qty 200

## 2017-02-15 MED ORDER — PIPERACILLIN-TAZOBACTAM 3.375 G IVPB
3.3750 g | Freq: Three times a day (TID) | INTRAVENOUS | Status: DC
  Administered 2017-02-15 – 2017-02-24 (×27): 3.375 g via INTRAVENOUS
  Filled 2017-02-15 (×34): qty 50

## 2017-02-15 MED ORDER — SODIUM CHLORIDE 0.9 % IV BOLUS (SEPSIS)
500.0000 mL | Freq: Once | INTRAVENOUS | Status: AC
  Administered 2017-02-15: 500 mL via INTRAVENOUS

## 2017-02-15 NOTE — Consult Note (Addendum)
WOC Nurse wound consult note Reason for Consult: diabetic wound plantar surface of foot Wound type: full thickness Pressure Injury POA: NA Measurement:small 1cm x1cm x 0.1 on ball of foot under 2nd metatarsal, black wound bed with calloused dry peeling perimeter. Infected wound under 5th metatarsal is 1cm x 2cm x 0.1cm with 10% white slough center and 90% dark brown dried exudate. Skin surrounding wound is more edematous than rest of foot, with a yellow area 3cm surrounding wound with a larger erythematous area surrounding that. The skin is intact, just discolored. Patient also has two incisions on dorsal surface of foot, 2cm at base of 2nd metarsal and 1.5cm lateral to 5th metatarsal. Both are unremarkable.  Wound bed:see above Drainage (amount, consistency, odor) see above Periwound:see above Dressing procedure/placement/frequency: Pt new in town but has been seen at a Wound Clinic in [place] where he was recently ordered Silvadene daily.  It has already been ordered by MD. Will place wound orders to go with the medication administration order. Pt to have ABI and MRI today.  If MRI is positive for osteomyelitis would recommend Ortho consult, osteomyelitis is not in the [facility] nurse scope of practice. Patient already has a nutritional consult. We will not follow, but will remain available to this patient, to nursing, and the medical and/or surgical teams.  Please re-consult if we need to assist further.   Barnett Hatter, RN-C, WTA-C, [facility] Wound Treatment Associate

## 2017-02-15 NOTE — ED Notes (Signed)
Called floor to give report. They state that they will call me back.

## 2017-02-15 NOTE — Progress Notes (Addendum)
Initial Nutrition Assessment  DOCUMENTATION CODES:   Not applicable  INTERVENTION:   Glucerna Shake po TID, each supplement provides 220 kcal and 10 grams of protein  Provide "Carbohydrate Counting for People with Diabetes" handout from the Academy of Nutrition and Dietetics.  NUTRITION DIAGNOSIS:   Increased nutrient needs related to wound healing as evidenced by estimated needs.  GOAL:   Patient will meet greater than or equal to 90% of their needs  MONITOR:   PO intake, Supplement acceptance, Labs, Skin  REASON FOR ASSESSMENT:   Consult Wound healing  ASSESSMENT:   Pt with PMH of HLD, tobacco abuse, uncontrolled DM, CKD III DVT and CAD. Pt reports having right foot ulcers for 4 months PTA with multiple debridements in the past. Presents this admission with infected diabetic foot ulcer with sepsis.   Pt awaiting MRI results for possible osteomyelitis. Ortho to be consulted if positive. Pt reports having great appetite prior to admission and eager to eat this admission. Pt currently NPO for MRI.  Weight noted to be stable with no reported unintentional wt loss. Will provide supplementation for increased protein needs. Discussed the importance of protein for wound healing. MD to order A1c. Will monitor for results. Pt has prior education on eating for diabetes. RD provided "Carbohydrate Counting for People with Diabetes" handout from the Academy of Nutrition and Dietetics.  Nutrition-Focused physical exam completed. Findings are no fat depletion, no muscle depletion, and no edema.   Medications reviewed and include: SSI, IV abx, NS @ 125 ml/hr Labs reviewed: Na 134 (L) CO2 20 (L)  CBG 147 Creatinine 1.38 (H) Albumin 3.0 (L) PAB 13.8 (L)  Diet Order:  Diet Carb Modified Fluid consistency: Thin; Room service appropriate? Yes  Skin:   (Open wound right foot)  Last BM:  02/14/17  Height:   Ht Readings from Last 1 Encounters:  02/15/17 6\' 3"  (1.905 m)    Weight:   Wt  Readings from Last 1 Encounters:  02/15/17 205 lb 14.6 oz (93.4 kg)    Ideal Body Weight:  89.1 kg  BMI:  Body mass index is 25.74 kg/m.  Estimated Nutritional Needs:   Kcal:  2225-2425 (25-27 kcal/kg IBW)  Protein:  115-125 grams (1.3-1.4 g/kg IBW)  Fluid:  >2.2 L/day  EDUCATION NEEDS:   No education needs identified at this time  [name] RD, LDN Clinical Nutrition Pager # - [phone]

## 2017-02-15 NOTE — Progress Notes (Signed)
Medical record requests sent to two facilities in [place] as requested by MD.

## 2017-02-15 NOTE — Progress Notes (Signed)
Received social work consult to help patient access medications, csw unable to help with meds. Please refer to [facility] CSW signing off.   Vivi Barrack, Theresia Majors, MSW Clinical Social Worker 5E and Psychiatric Service Line [phone] 02/15/2017  8:15 AM

## 2017-02-15 NOTE — Progress Notes (Signed)
VASCULAR LAB PRELIMINARY  ARTERIAL  ABI completed:    RIGHT    LEFT    PRESSURE WAVEFORM  PRESSURE WAVEFORM  BRACHIAL 156 Tripahsic BRACHIAL 143 Triphasic  DP 136 Triphasic DP 105 Biphasic  PT 143 Triphasic PT 130 Biphasic  GREAT TOE 95 NA GREAT TOE 106 NA    RIGHT LEFT  ABI / TBI 0.92 / 0.61 0.83 / 0.69   ABIs and Doppler waveforms indicate a mild reduction in arterial flow bilaterally at rest. TBIs are abnormal bilaterally  Yasha Tibbett, RVS 02/15/2017, 4:09 PM

## 2017-02-15 NOTE — Progress Notes (Addendum)
PROGRESS NOTE    Curtis Bradford  ZOX:[id number] DOB: 08-02-1960 DOA: 02/14/2017 PCP: Patient, No Pcp Per  Brief Narrative: Curtis Bradford is a 56 year old male with history of diabetes on insulin, ongoing tobacco abuse, CAD status post PCI/stent, right leg peripheral arterial disease status post PCI and stenting, chronic right foot wound for over 4-6 months now, has been having ongoing care for this at the [facility] in [place], presented to our emergency room 8/23 night with increased pain, fevers and chills. Patient has had a complicated medical history with regards to his right foot, diagnosed with peripheral arterial disease and got a stent to an artery in his right leg in 2016. In February 2018 is when he started having issues with wounds in his right foot during which an angiogram that revealed stenosis of prior stent, subsequently underwent PCI and 3 new stents were placed in his right leg in February 2018 at [facility] in [place]. After this was also treated with a few weeks of antibiotics via a PICC line under the care of an infectious disease doctor. From May 2018 onwards he was being followed weekly at a wound care center in [facility] in [place], [place] and was getting local wound care, however was noted to have exposed bone subsequently saw an orthopedic surgeon and he underwent incision and debridement as well as some bone resection on January 23 2017. After this had debridement again on Aug 13 and during this time was on few weeks of IV Zosyn and Daptomycin via PICC line, then decided to move to [facility] and PICC was removed by his treating MDs on 8/14.  Assessment & Plan:   Complicated Diabetic foot wound with infection -previously treated for osteomyelitis with long courses of IV antibiotics, and status post debridement 2 -I have requested records from [facility] in [place], and information regarding revascularization and stenting  [facility]. -Continue broad-spectrum IV antibiotics vancomycin/Zosyn and IV fluids -Follow-up blood cultures  -MRI of the right foot  -ABI of right foot  -Orthopedics consult requested per Dr.Xu    Type II diabetes mellitus with renal manifestations (HCC) -Continue Lantus, sliding scale, follow-up hemoglobin A1c     Ongoing tobacco abuse -Counseled   PAD right leg -Status post PCI and stenting 3 in 07/2016 and previously in 2016 -Continue aspirin, Plavix, statin -Follow-up ABI Have requested records   History of CAD -Remote PCI and stent -Continue aspirin, Plavix, statin  History of seizures -Last more than 2 years ago -Continue Keppra    CKD3 -Creatinine at 1.38 today which is probably close to his baseline, monitor with vancomycin use -Continue IV fluids today  DVT prophylaxis: subcutaneous heparin Code Status:  full code  Family Communication: currently staying in a hotel  Disposition Plan:  pending workup/improvement   Consultants:   Orthopedics Dr. Roda Shutters    Procedures:  Antimicrobials:   8/23: Uvaldo Bristle    Subjective: -Continues to have pain, photophobia or after starting systemic antibiotics   Objective: Vitals:   02/15/17 0140 02/15/17 0630 02/15/17 0842 02/15/17 0843  BP: (!) 143/75 (!) 142/75  119/69  Pulse: (!) 107 (!) 102  87  Resp: (!) 24 16  (!) 21  Temp: 99.5 F (37.5 C) (!) 102.2 F (39 C) (!) 100.4 F (38 C)   TempSrc: Oral Oral    SpO2: 100% 96%  97%  Weight: 93.4 kg (205 lb 14.6 oz)     Height: 6\' 3"  (1.905 m)  Intake/Output Summary (Last 24 hours) at 02/15/17 1241 Last data filed at 02/15/17 0600  Gross per 24 hour  Intake           758.33 ml  Output                0 ml  Net           758.33 ml   Filed Weights   02/14/17 1623 02/15/17 0140  Weight: 86.2 kg (190 lb) 93.4 kg (205 lb 14.6 oz)    Examination:  General exam: Appears calm and comfortable  Respiratory system: Clear to auscultation.  Respiratory effort normal. Cardiovascular system: S1 & S2 heard, RRR. No JVD, murmurs Gastrointestinal system: Abdomen is nondistended, soft and nontender. Normal bowel sounds heard. Central nervous system: Alert and oriented. No focal neurological deficits. Extremities: Swollen and this formed right foot, with 2 ulcers, one ulcer under second metatarsal bone and one at the base of the fifth metatarsal with some slough in the center and dried exudate around it. Dorsal surface of the foot with 2 incisions which appear unremarkable Skinas above Psychiatry: Judgement and insight appear normal. Mood & affect appropriate.     Data Reviewed:   CBC:  Recent Labs Lab 02/10/17 2325 02/14/17 1627 02/15/17 0610  WBC 10.6* 13.9* 11.6*  NEUTROABS 6.3 12.3*  --   HGB 14.5 13.8 11.9*  HCT 40.8 40.0 35.1*  MCV 87.9 87.5 87.3  PLT 308 276 226   Basic Metabolic Panel:  Recent Labs Lab 02/10/17 2325 02/14/17 1627 02/15/17 0610  NA 138 134* 134*  K 3.8 4.2 3.6  CL 103 103 106  CO2 27 25 20*  GLUCOSE 143* 213* 147*  BUN 23* 22* 18  CREATININE 1.45* 1.44* 1.38*  CALCIUM 9.1 9.0 7.9*   GFR: Estimated Creatinine Clearance: 73.1 mL/min (A) (by C-G formula based on SCr of 1.38 mg/dL (H)). Liver Function Tests:  Recent Labs Lab 02/14/17 1627 02/15/17 0610  AST 14* 15  ALT 16* 19  ALKPHOS 72 59  BILITOT 0.8 0.7  PROT 7.3 6.0*  ALBUMIN 3.8 3.0*   No results for input(s): LIPASE, AMYLASE in the last 168 hours. No results for input(s): AMMONIA in the last 168 hours. Coagulation Profile:  Recent Labs Lab 02/15/17 0043  INR 1.19   Cardiac Enzymes: No results for input(s): CKTOTAL, CKMB, CKMBINDEX, TROPONINI in the last 168 hours. BNP (last 3 results) No results for input(s): PROBNP in the last 8760 hours. HbA1C: No results for input(s): HGBA1C in the last 72 hours. CBG:  Recent Labs Lab 02/14/17 2151 02/15/17 0736 02/15/17 1153  GLUCAP 134* 145* 162*   Lipid  Profile: No results for input(s): CHOL, HDL, LDLCALC, TRIG, CHOLHDL, LDLDIRECT in the last 72 hours. Thyroid Function Tests: No results for input(s): TSH, T4TOTAL, FREET4, T3FREE, THYROIDAB in the last 72 hours. Anemia Panel: No results for input(s): VITAMINB12, FOLATE, FERRITIN, TIBC, IRON, RETICCTPCT in the last 72 hours. Urine analysis:    Component Value Date/Time   COLORURINE YELLOW 02/14/2017 1942   APPEARANCEUR CLEAR 02/14/2017 1942   LABSPEC 1.024 02/14/2017 1942   PHURINE 5.0 02/14/2017 1942   GLUCOSEU 150 (A) 02/14/2017 1942   HGBUR MODERATE (A) 02/14/2017 1942   BILIRUBINUR NEGATIVE 02/14/2017 1942   KETONESUR NEGATIVE 02/14/2017 1942   PROTEINUR 100 (A) 02/14/2017 1942   NITRITE NEGATIVE 02/14/2017 1942   LEUKOCYTESUR NEGATIVE 02/14/2017 1942   Sepsis Labs: @LABRCNTIP (procalcitonin:4,lacticidven:4)  )No results found for this or any previous visit (from the  past 240 hour(s)).       Radiology Studies: Dg Chest 2 View  Result Date: 02/14/2017 CLINICAL DATA:  Right foot wound. EXAM: CHEST  2 VIEW COMPARISON:  None. FINDINGS: The heart size and mediastinal contours are within normal limits. Both lungs are clear. The visualized skeletal structures are unremarkable. IMPRESSION: No active cardiopulmonary disease. Electronically Signed   By: Gerome Sam III M.D   On: 02/14/2017 17:17   Dg Foot Complete Right  Result Date: 02/14/2017 CLINICAL DATA:  Patient with nonhealing wound to the right foot. Fever. EXAM: RIGHT FOOT COMPLETE - 3+ VIEW COMPARISON:  Foot radiograph 02/10/2017 FINDINGS: Patient status post second through fifth metatarsal ostomies. There is soft tissue swelling and focus of soft tissue gas at the lateral foot near the mid aspect of the fifth metatarsal. There appears be overlying soft tissue ulceration at this location. No evidence for acute fracture or dislocation. No acute appearing osseous abnormality. IMPRESSION: Suggestion of soft tissue ulceration  about the lateral aspect of the foot with underlying soft tissue gas, progressed from prior, concerning for worsening infectious process. No frank evidence for osteomyelitis although MRI is more sensitive. Electronically Signed   By: Annia Belt M.D.   On: 02/14/2017 21:19        Scheduled Meds: . aspirin EC  81 mg Oral QHS  . atorvastatin  20 mg Oral QHS  . clopidogrel  75 mg Oral QHS  . fluconazole  100 mg Oral Daily  . heparin  5,000 Units Subcutaneous Q8H  . insulin aspart  0-9 Units Subcutaneous TID WC  . insulin glargine  25 Units Subcutaneous QHS  . levETIRAcetam  1,000 mg Oral QHS  . nicotine  21 mg Transdermal Daily  . silver sulfADIAZINE  1 application Topical QHS   Continuous Infusions: . sodium chloride 75 mL/hr at 02/15/17 1154  . piperacillin-tazobactam (ZOSYN)  IV Stopped (02/15/17 0949)  . vancomycin Stopped (02/15/17 1228)     LOS: 1 day    Time spent:    Zannie Cove, MD [facility] Pager [phone]  If 7PM-7AM, please contact night-coverage [email or web address] Password [facility] 02/15/2017, 12:41 PM

## 2017-02-15 NOTE — Progress Notes (Signed)
Inpatient Diabetes Program Recommendations  AACE/ADA: New Consensus Statement on Inpatient Glycemic Control (2015)  Target Ranges:  Prepandial:   less than 140 mg/dL      Peak postprandial:   less than 180 mg/dL (1-2 hours)      Critically ill patients:  140 - 180 mg/dL   Lab Results  Component Value Date   GLUCAP 162 (H) 02/15/2017    Review of Glycemic Control  Diabetes history: DM2 Outpatient Diabetes medications: Lantus 37 units QHS, Humalog 0-15 units bid, tradjenta 5 mg QD Current orders for Inpatient glycemic control: Novolog 0-9 units tidwc, Lantus 25 units QHS,  No HgbA1C  Blood sugars trending well today.  Inpatient Diabetes Program Recommendations:   HgbA1C to assess glycemic control prior to admission.  Imperative to maintain tight glycemic control for wound healing.  Will follow.  Thank you. Ailene Ards, RD, LDN, CDE Inpatient Diabetes Coordinator [phone]

## 2017-02-15 NOTE — Care Management Note (Signed)
Case Management Note  Patient Details  Name: Curtis Bradford MRN: [id number] Date of Birth: 1960-12-07  Subjective/Objective:                   56 y.o. male with medical history significant of diabetes mellitus, hyperlipidemia, tobacco abuse, CK-3, seizure, CAD, stent placement, possible right leg PVD (s/p of 5 stent placement per pt), who presents with right foot also with infection, fever and chills.  Action/Plan: Date:  February 15, 2017 Chart reviewed for concurrent status and case management needs. Will continue to follow patient progress. Discharge Planning: following for needs Expected discharge date: [id number] Marcelle Smiling, BSN, [place], [place]   [phone]  Expected Discharge Date:   (unknown)               Expected Discharge Plan:  Home/Self Care  In-House Referral:     Discharge planning Services  CM Consult  Post Acute Care Choice:    Choice offered to:     DME Arranged:    DME Agency:     HH Arranged:    HH Agency:     Status of Service:  In process, will continue to follow  If discussed at Long Length of Stay Meetings, dates discussed:    Additional Comments:  Golda Acre, RN 02/15/2017, 9:07 AM

## 2017-02-16 DIAGNOSIS — E11628 Type 2 diabetes mellitus with other skin complications: Secondary | ICD-10-CM

## 2017-02-16 DIAGNOSIS — I739 Peripheral vascular disease, unspecified: Secondary | ICD-10-CM | POA: Diagnosis present

## 2017-02-16 DIAGNOSIS — G629 Polyneuropathy, unspecified: Secondary | ICD-10-CM

## 2017-02-16 DIAGNOSIS — F17210 Nicotine dependence, cigarettes, uncomplicated: Secondary | ICD-10-CM

## 2017-02-16 DIAGNOSIS — E1142 Type 2 diabetes mellitus with diabetic polyneuropathy: Secondary | ICD-10-CM

## 2017-02-16 LAB — GLUCOSE, CAPILLARY
GLUCOSE-CAPILLARY: 100 mg/dL — AB (ref 65–99)
GLUCOSE-CAPILLARY: 173 mg/dL — AB (ref 65–99)
GLUCOSE-CAPILLARY: 166 mg/dL — AB (ref 65–99)
Glucose-Capillary: 168 mg/dL — ABNORMAL HIGH (ref 65–99)

## 2017-02-16 LAB — CBC
HCT: 32.7 % — ABNORMAL LOW (ref 39.0–52.0)
HEMOGLOBIN: 11.1 g/dL — AB (ref 13.0–17.0)
MCH: 30.2 pg (ref 26.0–34.0)
MCHC: 33.9 g/dL (ref 30.0–36.0)
MCV: 89.1 fL (ref 78.0–100.0)
Platelets: 208 10*3/uL (ref 150–400)
RBC: 3.67 MIL/uL — AB (ref 4.22–5.81)
RDW: 14.6 % (ref 11.5–15.5)
WBC: 9.9 10*3/uL (ref 4.0–10.5)

## 2017-02-16 LAB — CREATININE, SERUM
Creatinine, Ser: 1.56 mg/dL — ABNORMAL HIGH (ref 0.61–1.24)
GFR calc Af Amer: 56 mL/min — ABNORMAL LOW (ref >60)
GFR calc non Af Amer: 49 mL/min — ABNORMAL LOW (ref >60)

## 2017-02-16 LAB — VANCOMYCIN, TROUGH: Vancomycin Tr: 17 ug/mL (ref 15–20)

## 2017-02-16 NOTE — Consult Note (Signed)
[facility]    Date of Admission:  02/14/2017           Day 3 vancomycin        Day 3 piperacillin tazobactam       Reason for Consult: Persistent to diabetic foot infection    Referring Provider: Dr. Zannie Cove  Assessment: He has osteomyelitis and soft tissue infection that has flared up within 1 week of stopping IV antibiotics following his recent metatarsal resections. Obviously, it would be helpful to have all of his medical records, especially his recent operative report, microbiology report and pathology report. I will continue empiric vancomycin and piperacillin tazobactam for now. I will have my partner, Dr. Judyann Munson, follow-up on Monday, 02/18/2017.   Plan: 1. Continue current antibiotics   Principal Problem:   Diabetic foot infection (HCC) Active Problems:   Sepsis (HCC)   Type II diabetes mellitus with renal manifestations (HCC)   HLD (hyperlipidemia)   Tobacco abuse   CKD (chronic kidney disease), stage III   Type 2 diabetes mellitus with diabetic foot ulcer (HCC)   CAD (coronary artery disease)   Seizure (HCC)   Peripheral neuropathy   . aspirin EC  81 mg Oral QHS  . atorvastatin  20 mg Oral QHS  . clopidogrel  75 mg Oral QHS  . fluconazole  100 mg Oral Daily  . heparin  5,000 Units Subcutaneous Q8H  . insulin aspart  0-9 Units Subcutaneous TID WC  . insulin glargine  25 Units Subcutaneous QHS  . levETIRAcetam  1,000 mg Oral QHS  . nicotine  21 mg Transdermal Daily  . 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 Several years ago he developed osteomyelitis of his right fourth toe. Had a long course of IV antibiotics and underwent resection of the fourth metatarsal head. This past February he began to develop a plantar ulcer on his right  foot under the second metatarsal head. He started seeing Dr. Adine Madura in [place] who was a wound and infectious disease specialist. He has been on multiple courses of antibiotics. He was undergoing serial debridements of the wound each week followed by casting of his foot. He then developed diffuse swelling and redness of his right second toe in late July. It appears that he was on doxycycline and amoxicillin clavulanate for a period of time. He was then referred to orthopedic surgery and on 01/23/2017 underwent resection of the right second, third and fifth metatarsal heads. Postoperatively he received 2 weeks of IV daptomycin and piperacillin tazobactam. He was told that there was no evidence of infection on cultures. Last week he moved here for a new position with [employer]. He began having fever and chills followed by diffuse swelling of his right foot leading to admission 2 days ago. An MRI showed cortical irregularity throughout the second toe and bone marrow edema and severe soft tissue swelling around the metatarsal resection sites but it was unclear if this is postoperative change or soft tissue infection and osteomyelitis. He was started on IV vancomycin and piperacillin tazobactam. He states that his foot is more swollen and red today. He has been trying to cut down on his cigarettes.   Review of Systems: Review of Systems  Constitutional: Positive for chills, fever and malaise/fatigue. Negative for  diaphoresis and weight loss.  Respiratory: Negative for cough, sputum production and shortness of breath.   Cardiovascular: Negative for chest pain.  Gastrointestinal: Negative for abdominal pain, diarrhea, nausea and vomiting.  Musculoskeletal: Positive for joint pain.  Skin: Negative for rash.  Neurological: Positive for sensory change. Negative for dizziness.    Past Medical History:  Diagnosis Date  . CAD (coronary artery disease)   . CKD (chronic kidney disease), stage III   . Diabetes  mellitus without complication (HCC)    Type II  . DVT (deep vein thrombosis) in pregnancy (HCC)   . HLD (hyperlipidemia)   . Seizure (HCC)   . Tobacco abuse     Social History  Substance Use Topics  . Smoking status: Current Some Day Smoker  . Smokeless tobacco: Never Used     Comment: pack or less a week  . Alcohol use Yes     Comment: seldom    Family History  Problem Relation Age of Onset  . Diabetes Mellitus II Mother   . Diabetes Mellitus II Father    No Known Allergies  OBJECTIVE: Blood pressure 139/74, pulse 71, temperature 98.6 F (37 C), temperature source Oral, resp. rate 20, height 6\' 3"  (1.905 m), weight 205 lb 14.6 oz (93.4 kg), SpO2 97 %.  Physical Exam  Constitutional: He is oriented to person, place, and time.  He is very pleasant and talkative.  HENT:  Mouth/Throat: No oropharyngeal exudate.  Cardiovascular: Normal rate and regular rhythm.   No murmur heard. Pulmonary/Chest: Effort normal and breath sounds normal.  Abdominal: Soft. There is no tenderness.  Musculoskeletal:  He has diffuse swelling and edema of his right foot and lower leg. The swelling in his toes is most noticeable in the right second toe. He has plantar ulcers under his right second metatarsal and fifth metatarsal. There is bright red erythema laterally on the right foot.  Neurological: He is alert and oriented to person, place, and time.  Skin: No rash noted.  Psychiatric: Mood and affect normal.    Lab Results Lab Results  Component Value Date   WBC 9.9 02/16/2017   HGB 11.1 (L) 02/16/2017   HCT 32.7 (L) 02/16/2017   MCV 89.1 02/16/2017   PLT 208 02/16/2017    Lab Results  Component Value Date   CREATININE 1.56 (H) 02/16/2017   BUN 18 02/15/2017   NA 134 (L) 02/15/2017   K 3.6 02/15/2017   CL 106 02/15/2017   CO2 20 (L) 02/15/2017    Lab Results  Component Value Date   ALT 19 02/15/2017   AST 15 02/15/2017   ALKPHOS 59 02/15/2017   BILITOT 0.7 02/15/2017   Sed  Rate (mm/hr)  Date Value  02/14/2017 31 (H)   CRP (mg/dL)  Date Value  03/50/0938 12.3 (H)     Microbiology: Recent Results (from the past 240 hour(s))  Blood Culture (routine x 2)     Status: None (Preliminary result)   Collection Time: 02/14/17  9:57 PM  Result Value Ref Range Status   Specimen Description BLOOD BLOOD RIGHT FOREARM  Final   Special Requests   Final    BOTTLES DRAWN AEROBIC AND ANAEROBIC Blood Culture adequate volume   Culture NO GROWTH 1 DAY  Final   Report Status PENDING  Incomplete  Blood Culture (routine x 2)     Status: None (Preliminary result)   Collection Time: 02/14/17  9:57 PM  Result Value Ref Range Status   Specimen Description BLOOD  LEFT ANTECUBITAL  Final   Special Requests   Final    BOTTLES DRAWN AEROBIC AND ANAEROBIC Blood Culture adequate volume   Culture NO GROWTH 1 DAY  Final   Report Status PENDING  Incomplete    Cliffton Asters, MD [facility] for Infectious Disease [facility] [phone] pager   [phone] cell 02/16/2017, 12:48 PM

## 2017-02-16 NOTE — Consult Note (Signed)
ORTHOPAEDIC CONSULTATION  REQUESTING PHYSICIAN: Zannie Cove, MD  Chief Complaint: right DFU, cellulitis  HPI: Curtis Bradford is a 56 y.o. male who presents with right DFU and cellulitis.  He's had multiple surgeries by orthopedist in [place].  He's had 2nd-5th MT head resection earlier this month.  He's now living in [place].  He presented to [facility] long ER with increasing pain, fevers, chills.  His wounds are 4-6 months old.  He's had PVD and stenting.  He's a diabetic on insulin and smoker.  Past Medical History:  Diagnosis Date  . CAD (coronary artery disease)   . CKD (chronic kidney disease), stage III   . 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 DVT (deep vein thrombosis) in pregnancy (HCC)   . HLD (hyperlipidemia)   . Seizure (HCC)   . Tobacco abuse    Past Surgical History:  Procedure Laterality Date  . FOOT SURGERY    . STENT PLACEMENT VASCULAR ([facility] HX)     Several in right leg   Social History   Social History  . Marital status: Unknown    Spouse name: N/A  . Number of children: N/A  . Years of education: N/A   Social History Main Topics  . Smoking status: Current Some Day Smoker  . Smokeless tobacco: Never Used     Comment: pack or less a week  . Alcohol use Yes     Comment: seldom  . Drug use: No  . Sexual activity: Not Asked   Other Topics Concern  . None   Social History Narrative  . None   Family History  Problem Relation Age of Onset  . Diabetes Mellitus II Mother   . Diabetes Mellitus II Father    - negative except otherwise stated in the family history section No Known Allergies Prior to Admission medications   Medication Sig Start Date End Date Taking? Authorizing Provider  aspirin EC 81 MG tablet Take 81 mg by mouth at bedtime.   Yes [provider]  atorvastatin (LIPITOR) 20 MG tablet Take 20 mg by mouth at bedtime.   Yes [provider]  clopidogrel (PLAVIX) 75 MG tablet Take 75 mg by mouth at  bedtime.   Yes [provider]  HYDROcodone-acetaminophen (NORCO/VICODIN) 5-325 MG tablet Take 1 tablet by mouth 3 (three) times daily as needed for moderate pain.   Yes [provider]  insulin glargine (LANTUS) 100 unit/mL SOPN Inject 37 Units into the skin at bedtime.   Yes [provider]  Insulin Lispro (HUMALOG KWIKPEN ) Inject 0-15 Units into the skin 2 (two) times daily. Per sliding scale   Yes [provider]  levETIRAcetam (KEPPRA) 1000 MG tablet Take 1,000 mg by mouth at bedtime.   Yes [provider]  linagliptin (TRADJENTA) 5 MG TABS tablet Take 5 mg by mouth daily.   Yes [provider]  silver sulfADIAZINE (SILVADENE) 1 % cream Apply 1 application topically at bedtime.   Yes [provider]   Dg Chest 2 View  Result Date: 02/14/2017 CLINICAL DATA:  Right foot wound. EXAM: CHEST  2 VIEW COMPARISON:  None. FINDINGS: The heart size and mediastinal contours are within normal limits. Both lungs are clear. The visualized skeletal structures are unremarkable. IMPRESSION: No active cardiopulmonary disease. Electronically Signed   By: Gerome Sam III M.D   On: 02/14/2017 17:17   Mr Foot Right Wo Contrast  Result Date: 02/15/2017 CLINICAL DATA:  Diabetic foot  ulcer. EXAM: MRI OF THE RIGHT FOREFOOT WITHOUT CONTRAST TECHNIQUE: Multiplanar, multisequence MR imaging of the right forefoot was performed. No intravenous contrast was administered. COMPARISON:  None. FINDINGS: Bones/Joint/Cartilage Prior transmetatarsal amputation of the second, third, fourth and fifth metatarsal heads. Severe soft tissue swelling at the metatarsal resection site with multiple low signal foci within the resection site concerning for air. Bone marrow edema within the second, third and fifth metatarsals distally. Cortical irregularity an abnormal bone marrow throughout the second proximal phalanx with surrounding soft tissue prominence most concerning for  osteomyelitis. Mild osteoarthritis of the first MTP joint. Bipartite medial hallux sesamoid. Normal alignment. No joint effusion. Ligaments Collateral ligaments are intact. Muscles and Tendons Mild T2 hyperintense signal throughout the plantar musculature likely neurogenic. Soft tissue No soft tissue mass.  No drainable fluid collection or hematoma. IMPRESSION: 1. Prior transmetatarsal amputation of the second, third, fourth and fifth metatarsal heads. Severe soft tissue swelling at the metatarsal resection site with multiple low signal foci within the resection site concerning for air and bone marrow edema within the second, third and fifth metatarsals distally. These may reflect postsurgical changes versus persistent cellulitis and osteomyelitis. 2. Cortical irregularity an abnormal bone marrow throughout the second proximal phalanx with surrounding soft tissue prominence most concerning for osteomyelitis. Electronically Signed   By: Elige Ko   On: 02/15/2017 14:24   Dg Foot Complete Right  Result Date: 02/14/2017 CLINICAL DATA:  Patient with nonhealing wound to the right foot. Fever. EXAM: RIGHT FOOT COMPLETE - 3+ VIEW COMPARISON:  Foot radiograph 02/10/2017 FINDINGS: Patient status post second through fifth metatarsal ostomies. There is soft tissue swelling and focus of soft tissue gas at the lateral foot near the mid aspect of the fifth metatarsal. There appears be overlying soft tissue ulceration at this location. No evidence for acute fracture or dislocation. No acute appearing osseous abnormality. IMPRESSION: Suggestion of soft tissue ulceration about the lateral aspect of the foot with underlying soft tissue gas, progressed from prior, concerning for worsening infectious process. No frank evidence for osteomyelitis although MRI is more sensitive. Electronically Signed   By: Annia Belt M.D.   On: 02/14/2017 21:19   - pertinent xrays, CT, MRI studies were reviewed and independently  interpreted  Positive ROS: All other systems have been reviewed and were otherwise negative with the exception of those mentioned in the HPI and as above.  Physical Exam: General: Alert, no acute distress Cardiovascular: No pedal edema Respiratory: No cyanosis, no use of accessory musculature GI: No organomegaly, abdomen is soft and non-tender Skin: No lesions in the area of chief complaint Neurologic: Sensation intact distally Psychiatric: [name] competent for consent with normal mood and affect Lymphatic: No axillary or cervical lymphadenopathy  MUSCULOSKELETAL:  - weak DP and PT pulse - sensation is decreased - cellulitis on dorsal forefoot - dry plantar ulcers  Assessment: Right DFU  Plan: - MRI shows osteo of 2nd toe and gas in soft tissues - xrays show prior 2nd-5th MT head resection - continue IV abx - NWB, elevation - will have Dr. Lajoyce Corners evaluate on Monday  Thank you for the consult and the opportunity to see Curtis Bradford. Glee Arvin, MD [facility] [phone] 12:11 PM

## 2017-02-16 NOTE — Progress Notes (Signed)
Rx Brief note:  Vancomycin  See 8/25 note by Azzie Glatter for details.  Assesement: 2047 VT=17 mg/L on 1 Gm IV q12h at goal. Scr slight increase  Plan: Continue Vanomcyin 1 Gm IV q12h F/u scr/cultures/additional levels   Thanks Lorenza Evangelist 02/16/2017 9:51 PM

## 2017-02-16 NOTE — Progress Notes (Addendum)
PROGRESS NOTE    Curtis Bradford  YQI:[id number] DOB: 06-09-1961 DOA: 02/14/2017 PCP: Patient, No Pcp Per  Brief Narrative: Curtis Bradford is a 56 year old male with history of diabetes on insulin, ongoing tobacco abuse, CAD status post PCI/stent, right leg peripheral arterial disease status post PCI and stenting, chronic right foot wound for over 4-6 months now, has been having ongoing care for this at the [facility] in [place], presented to our emergency room 8/23 night with increased pain, fevers and chills. Patient has had a complicated medical history with regards to his right foot, diagnosed with peripheral arterial disease and got a stent to an artery in his right leg in 2016. In February 2018 is when he started having issues with wounds in his right foot during which an angiogram that revealed stenosis of prior stent, subsequently underwent PCI and 3 new stents were placed in his right leg in February 2018 at [facility] in [place]. After this was also treated with a few weeks of antibiotics via a PICC line under the care of an infectious disease doctor. From May 2018 onwards he was being followed weekly at a wound care center in [facility] in [place], [place] and was getting local wound care, however was noted to have exposed bone subsequently saw an orthopedic surgeon and he underwent excision of the second,  third and fifth metatarsal heads and extensor tenotomies to the second third and fifth toes as well as excisional debridement of plantar ulcer under the second metatarsal head on 01/23/17 by Dr. Kirk Ruths. After this had debridement again on Aug 13 and during this time was on few weeks of IV Zosyn and Daptomycin via PICC line, then decided to move to [facility] and PICC was removed by his treating MDs on 8/14.  Assessment & Plan:   Complex Diabetic foot wound with Osteomyelitis -previously treated for osteomyelitis with long courses of IV antibiotics,    -status post excision of the second second third and fifth metatarsal heads and extensor tenotomies to the second third and fifth toes as well as excisional debridement of plantar ulcer on 01/23/17 -I have requested records from [facility] in [place], and information regarding revascularization and stenting [facility], still awaiting some records, we called the health center again this am, Op note from 8/1 obtained this am -according to patient Bone culture in August was negtaive -Continue broad-spectrum IV antibiotics vancomycin/Zosyn -Blood cultures -NGTD -MRI of the right foot concerning for abnormal signal at site of previous surgical site, and osteomyelitis -ABI of right foot with mild decreased flow, s/p Angioplasty and 3 stents to artery in R leg in 07/2016 -Orthopedics consulted-d/w  Dr.Xu -ID consulted per Dr.Campbell    Type II diabetes mellitus with renal manifestations (HCC) -Continue Lantus, sliding scale, follow-up hemoglobin A1c  -stable    Ongoing tobacco abuse -Counseled   PAD right leg -Status post PCI and stenting 3 in 07/2016 and previously in 2016 -Continue aspirin, Plavix, statin -ABIs and Doppler waveforms indicate a mild reduction in arterial flow bilaterally Have requested records   History of CAD -Remote PCI and stent -Continue aspirin, Plavix, statin  History of seizures -Last more than 2 years ago -Continue Keppra    CKD3 -Creatinine at 1.38 today which is probably close to his baseline, monitor with vancomycin use -Continue IV fluids today  DVT prophylaxis: subcutaneous heparin Code Status:  full code  Family Communication: currently staying in a hotel  Disposition Plan:  pending workup/improvement   Consultants:   Orthopedics Dr. Roda Shutters    Procedures:  Antimicrobials:   8/23: Vanc/Zosyn    Subjective: -feels ok, some pain in R foot  Objective: Vitals:   02/15/17 0843 02/15/17 1717 02/15/17 1957 02/16/17  0531  BP: 119/69  127/66 139/74  Pulse: 87  87 71  Resp: (!) 21  (!) 21 20  Temp:  99.4 F (37.4 C) 99.8 F (37.7 C) 98.6 F (37 C)  TempSrc:  Oral Oral Oral  SpO2: 97%  97% 97%  Weight:      Height:        Intake/Output Summary (Last 24 hours) at 02/16/17 1028 Last data filed at 02/16/17 0957  Gross per 24 hour  Intake             2535 ml  Output                0 ml  Net             2535 ml   Filed Weights   02/14/17 1623 02/15/17 0140  Weight: 86.2 kg (190 lb) 93.4 kg (205 lb 14.6 oz)    Examination:  General exam: AAOx3, no distress Respiratory system: CTAB Cardiovascular system: S1 & S2 heard, RRR. No JVD, murmurs Gastrointestinal system: Abdomen is nondistended, soft and nontender. Normal bowel sounds heard. Central nervous system: Alert and oriented. No focal neurological deficits. Extremities: Swollen and this formed right foot, with 2 ulcers, one ulcer under second metatarsal bone and one at the base of the fifth metatarsal with some slough in the center and dried exudate around it. Dorsal surface of the foot with 2 incisions which appear unremarkable Skin as above Psychiatry: appropriate    Data Reviewed:   CBC:  Recent Labs Lab 02/10/17 2325 02/14/17 1627 02/15/17 0610 02/16/17 0534  WBC 10.6* 13.9* 11.6* 9.9  NEUTROABS 6.3 12.3*  --   --   HGB 14.5 13.8 11.9* 11.1*  HCT 40.8 40.0 35.1* 32.7*  MCV 87.9 87.5 87.3 89.1  PLT 308 276 226 208   Basic Metabolic Panel:  Recent Labs Lab 02/10/17 2325 02/14/17 1627 02/15/17 0610 02/16/17 0535  NA 138 134* 134*  --   K 3.8 4.2 3.6  --   CL 103 103 106  --   CO2 27 25 20*  --   GLUCOSE 143* 213* 147*  --   BUN 23* 22* 18  --   CREATININE 1.45* 1.44* 1.38* 1.56*  CALCIUM 9.1 9.0 7.9*  --    GFR: Estimated Creatinine Clearance: 64.7 mL/min (A) (by C-G formula based on SCr of 1.56 mg/dL (H)). Liver Function Tests:  Recent Labs Lab 02/14/17 1627 02/15/17 0610  AST 14* 15  ALT 16* 19    ALKPHOS 72 59  BILITOT 0.8 0.7  PROT 7.3 6.0*  ALBUMIN 3.8 3.0*   No results for input(s): LIPASE, AMYLASE in the last 168 hours. No results for input(s): AMMONIA in the last 168 hours. Coagulation Profile:  Recent Labs Lab 02/15/17 0043  INR 1.19   Cardiac Enzymes: No results for input(s): CKTOTAL, CKMB, CKMBINDEX, TROPONINI in the last 168 hours. BNP (last 3 results) No results for input(s): PROBNP in the last 8760 hours. HbA1C:  Recent Labs  02/15/17 0606  HGBA1C 7.0*   CBG:  Recent Labs Lab 02/15/17 0736 02/15/17 1153 02/15/17 1714 02/15/17 2200 02/16/17 0728  GLUCAP 145* 162* 162* 163* 100*   Lipid Profile: No results for input(s): CHOL, HDL,  LDLCALC, TRIG, CHOLHDL, LDLDIRECT in the last 72 hours. Thyroid Function Tests: No results for input(s): TSH, T4TOTAL, FREET4, T3FREE, THYROIDAB in the last 72 hours. Anemia Panel: No results for input(s): VITAMINB12, FOLATE, FERRITIN, TIBC, IRON, RETICCTPCT in the last 72 hours. Urine analysis:    Component Value Date/Time   COLORURINE YELLOW 02/14/2017 1942   APPEARANCEUR CLEAR 02/14/2017 1942   LABSPEC 1.024 02/14/2017 1942   PHURINE 5.0 02/14/2017 1942   GLUCOSEU 150 (A) 02/14/2017 1942   HGBUR MODERATE (A) 02/14/2017 1942   BILIRUBINUR NEGATIVE 02/14/2017 1942   KETONESUR NEGATIVE 02/14/2017 1942   PROTEINUR 100 (A) 02/14/2017 1942   NITRITE NEGATIVE 02/14/2017 1942   LEUKOCYTESUR NEGATIVE 02/14/2017 1942   Sepsis Labs: @LABRCNTIP (procalcitonin:4,lacticidven:4)  ) Recent Results (from the past 240 hour(s))  Blood Culture (routine x 2)     Status: None (Preliminary result)   Collection Time: 02/14/17  9:57 PM  Result Value Ref Range Status   Specimen Description BLOOD BLOOD RIGHT FOREARM  Final   Special Requests   Final    BOTTLES DRAWN AEROBIC AND ANAEROBIC Blood Culture adequate volume   Culture NO GROWTH 1 DAY  Final   Report Status PENDING  Incomplete  Blood Culture (routine x 2)     Status:  None (Preliminary result)   Collection Time: 02/14/17  9:57 PM  Result Value Ref Range Status   Specimen Description BLOOD LEFT ANTECUBITAL  Final   Special Requests   Final    BOTTLES DRAWN AEROBIC AND ANAEROBIC Blood Culture adequate volume   Culture NO GROWTH 1 DAY  Final   Report Status PENDING  Incomplete         Radiology Studies: Dg Chest 2 View  Result Date: 02/14/2017 CLINICAL DATA:  Right foot wound. EXAM: CHEST  2 VIEW COMPARISON:  None. FINDINGS: The heart size and mediastinal contours are within normal limits. Both lungs are clear. The visualized skeletal structures are unremarkable. IMPRESSION: No active cardiopulmonary disease. Electronically Signed   By: Gerome Sam III M.D   On: 02/14/2017 17:17   Mr Foot Right Wo Contrast  Result Date: 02/15/2017 CLINICAL DATA:  Diabetic foot ulcer. EXAM: MRI OF THE RIGHT FOREFOOT WITHOUT CONTRAST TECHNIQUE: Multiplanar, multisequence MR imaging of the right forefoot was performed. No intravenous contrast was administered. COMPARISON:  None. FINDINGS: Bones/Joint/Cartilage Prior transmetatarsal amputation of the second, third, fourth and fifth metatarsal heads. Severe soft tissue swelling at the metatarsal resection site with multiple low signal foci within the resection site concerning for air. Bone marrow edema within the second, third and fifth metatarsals distally. Cortical irregularity an abnormal bone marrow throughout the second proximal phalanx with surrounding soft tissue prominence most concerning for osteomyelitis. Mild osteoarthritis of the first MTP joint. Bipartite medial hallux sesamoid. Normal alignment. No joint effusion. Ligaments Collateral ligaments are intact. Muscles and Tendons Mild T2 hyperintense signal throughout the plantar musculature likely neurogenic. Soft tissue No soft tissue mass.  No drainable fluid collection or hematoma. IMPRESSION: 1. Prior transmetatarsal amputation of the second, third, fourth and  fifth metatarsal heads. Severe soft tissue swelling at the metatarsal resection site with multiple low signal foci within the resection site concerning for air and bone marrow edema within the second, third and fifth metatarsals distally. These may reflect postsurgical changes versus persistent cellulitis and osteomyelitis. 2. Cortical irregularity an abnormal bone marrow throughout the second proximal phalanx with surrounding soft tissue prominence most concerning for osteomyelitis. Electronically Signed   By: Elige Ko   On:  02/15/2017 14:24   Dg Foot Complete Right  Result Date: 02/14/2017 CLINICAL DATA:  Patient with nonhealing wound to the right foot. Fever. EXAM: RIGHT FOOT COMPLETE - 3+ VIEW COMPARISON:  Foot radiograph 02/10/2017 FINDINGS: Patient status post second through fifth metatarsal ostomies. There is soft tissue swelling and focus of soft tissue gas at the lateral foot near the mid aspect of the fifth metatarsal. There appears be overlying soft tissue ulceration at this location. No evidence for acute fracture or dislocation. No acute appearing osseous abnormality. IMPRESSION: Suggestion of soft tissue ulceration about the lateral aspect of the foot with underlying soft tissue gas, progressed from prior, concerning for worsening infectious process. No frank evidence for osteomyelitis although MRI is more sensitive. Electronically Signed   By: Annia Belt M.D.   On: 02/14/2017 21:19        Scheduled Meds: . aspirin EC  81 mg Oral QHS  . atorvastatin  20 mg Oral QHS  . clopidogrel  75 mg Oral QHS  . fluconazole  100 mg Oral Daily  . heparin  5,000 Units Subcutaneous Q8H  . insulin aspart  0-9 Units Subcutaneous TID WC  . insulin glargine  25 Units Subcutaneous QHS  . levETIRAcetam  1,000 mg Oral QHS  . nicotine  21 mg Transdermal Daily  . silver sulfADIAZINE  1 application Topical QHS   Continuous Infusions: . sodium chloride 75 mL/hr at 02/16/17 0400  .  piperacillin-tazobactam (ZOSYN)  IV Stopped (02/16/17 0942)  . vancomycin 1,000 mg (02/16/17 1016)     LOS: 2 days    Time spent:    Zannie Cove, MD [facility] Pager [phone]  If 7PM-7AM, please contact night-coverage [email or web address] Password [facility] 02/16/2017, 10:28 AM

## 2017-02-16 NOTE — Progress Notes (Signed)
Pharmacy Antibiotic Note  Curtis Bradford is a 56 y.o. male with right foot infection admitted on 02/14/2017 with diabetic foot ulcer with infection.  Pharmacy has been consulted for zosyn and vancomycin dosing.  Plan: 1) Continue vancomycin 1g q12 2) Continue current Zosyn dosing 3) Rise in SCr this AM - check vanc trough prior to 10pm dose tonight which will be prior to 5th total dose  Height: 6\' 3"  (190.5 cm) Weight: 205 lb 14.6 oz (93.4 kg) IBW/kg (Calculated) : 84.5  Temp (24hrs), Avg:99.3 F (37.4 C), Min:98.6 F (37 C), Max:99.8 F (37.7 C)   Recent Labs Lab 02/10/17 2325 02/14/17 1627 02/14/17 1643 02/14/17 2206 02/15/17 0043 02/15/17 0610 02/16/17 0535  WBC 10.6* 13.9*  --   --   --  11.6*  --   CREATININE 1.45* 1.44*  --   --   --  1.38* 1.56*  LATICACIDVEN  --   --  1.21 1.38 0.8  --   --     Estimated Creatinine Clearance: 64.7 mL/min (A) (by C-G formula based on SCr of 1.56 mg/dL (H)).    No Known Allergies   Thank you for allowing pharmacy to be a part of this patient's care.   Hessie Knows, PharmD, BCPS Pager [phone] 02/16/2017 9:33 AM l

## 2017-02-17 DIAGNOSIS — E11628 Type 2 diabetes mellitus with other skin complications: Secondary | ICD-10-CM

## 2017-02-17 DIAGNOSIS — L089 Local infection of the skin and subcutaneous tissue, unspecified: Secondary | ICD-10-CM

## 2017-02-17 DIAGNOSIS — M86271 Subacute osteomyelitis, right ankle and foot: Secondary | ICD-10-CM

## 2017-02-17 LAB — BASIC METABOLIC PANEL
ANION GAP: 6 (ref 5–15)
BUN: 19 mg/dL (ref 6–20)
CHLORIDE: 105 mmol/L (ref 101–111)
CO2: 25 mmol/L (ref 22–32)
Calcium: 8.5 mg/dL — ABNORMAL LOW (ref 8.9–10.3)
Creatinine, Ser: 1.45 mg/dL — ABNORMAL HIGH (ref 0.61–1.24)
GFR calc non Af Amer: 53 mL/min — ABNORMAL LOW (ref >60)
Glucose, Bld: 128 mg/dL — ABNORMAL HIGH (ref 65–99)
Potassium: 3.7 mmol/L (ref 3.5–5.1)
Sodium: 136 mmol/L (ref 135–145)

## 2017-02-17 LAB — CBC
HEMATOCRIT: 33.4 % — AB (ref 39.0–52.0)
HEMOGLOBIN: 11.5 g/dL — AB (ref 13.0–17.0)
MCH: 29.9 pg (ref 26.0–34.0)
MCHC: 34.4 g/dL (ref 30.0–36.0)
MCV: 87 fL (ref 78.0–100.0)
Platelets: 227 10*3/uL (ref 150–400)
RBC: 3.84 MIL/uL — ABNORMAL LOW (ref 4.22–5.81)
RDW: 14 % (ref 11.5–15.5)
WBC: 7.8 10*3/uL (ref 4.0–10.5)

## 2017-02-17 LAB — GLUCOSE, CAPILLARY
GLUCOSE-CAPILLARY: 118 mg/dL — AB (ref 65–99)
GLUCOSE-CAPILLARY: 219 mg/dL — AB (ref 65–99)
Glucose-Capillary: 183 mg/dL — ABNORMAL HIGH (ref 65–99)
Glucose-Capillary: 142 mg/dL — ABNORMAL HIGH (ref 65–99)

## 2017-02-17 NOTE — Consult Note (Signed)
ORTHOPAEDIC CONSULTATION  REQUESTING PHYSICIAN: Zannie Cove, MD  Chief Complaint: Chronic osteomyelitis and infection right foot  HPI: Curtis Bradford is a 56 y.o. male who presents with right foot infection. Patient states he was initially treated in [place]. He states he underwent revision of stent placement 2 open circulation to the right lower extremity. Patient states that subsequent to this he has had several surgeries with resection of metatarsal heads 2,3,4 and 5. Patient states he was then placed in a total contact cast and the wounds and ulcers seem to worsen. Patient states that he had a persistent ulcer beneath the fifth metatarsal and he states that in the office further resection of the fifth metatarsal was performed. Patient states he could not get in to see a doctor in the office and he came to the emergency room for medical treatment.  Past Medical History:  Diagnosis Date  . CAD (coronary artery disease)   . CKD (chronic kidney disease), stage III   . 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 DVT (deep vein thrombosis) in pregnancy (HCC)   . HLD (hyperlipidemia)   . Seizure (HCC)   . Tobacco abuse    Past Surgical History:  Procedure Laterality Date  . FOOT SURGERY    . STENT PLACEMENT VASCULAR ([facility] HX)     Several in right leg   Social History   Social History  . Marital status: Unknown    Spouse name: N/A  . Number of children: N/A  . Years of education: N/A   Social History Main Topics  . Smoking status: Current Some Day Smoker  . Smokeless tobacco: Never Used     Comment: pack or less a week  . Alcohol use Yes     Comment: seldom  . Drug use: No  . Sexual activity: Not Asked   Other Topics Concern  . None   Social History Narrative  . None   Family History  Problem Relation Age of Onset  . Diabetes Mellitus II Mother   . Diabetes Mellitus II Father    - negative except otherwise stated in the family history  section No Known Allergies Prior to Admission medications   Medication Sig Start Date End Date Taking? Authorizing Provider  aspirin EC 81 MG tablet Take 81 mg by mouth at bedtime.   Yes [provider]  atorvastatin (LIPITOR) 20 MG tablet Take 20 mg by mouth at bedtime.   Yes [provider]  clopidogrel (PLAVIX) 75 MG tablet Take 75 mg by mouth at bedtime.   Yes [provider]  HYDROcodone-acetaminophen (NORCO/VICODIN) 5-325 MG tablet Take 1 tablet by mouth 3 (three) times daily as needed for moderate pain.   Yes [provider]  insulin glargine (LANTUS) 100 unit/mL SOPN Inject 37 Units into the skin at bedtime.   Yes [provider]  Insulin Lispro (HUMALOG KWIKPEN [place]) Inject 0-15 Units into the skin 2 (two) times daily. Per sliding scale   Yes [provider]  levETIRAcetam (KEPPRA) 1000 MG tablet Take 1,000 mg by mouth at bedtime.   Yes [provider]  linagliptin (TRADJENTA) 5 MG TABS tablet Take 5 mg by mouth daily.   Yes [provider]  silver sulfADIAZINE (SILVADENE) 1 % cream Apply 1 application topically at bedtime.   Yes [provider]   No results found. - pertinent xrays, CT, MRI studies were reviewed and independently interpreted  Positive ROS: All other systems have  been reviewed and were otherwise negative with the exception of those mentioned in the HPI and as above.  Physical Exam: General: Alert, no acute distress Psychiatric: [name] competent for consent with normal mood and affect Lymphatic: No axillary or cervical lymphadenopathy Cardiovascular: No pedal edema Respiratory: No cyanosis, no use of accessory musculature GI: No organomegaly, abdomen is soft and non-tender  Skin: Examination patient has cellulitis over the forefoot with swelling of the second toe and a Wagner grade 3 ulcer beneath the fifth metatarsal.   Neurologic: Patient does not have protective sensation  bilateral lower extremities.   MUSCULOSKELETAL:  Examination patient has a good dorsalis pedis pulse. The ulcer beneath the fifth metatarsal probes to bone. He has sausage digit swelling of the second toe consistent with chronic osteomyelitis. There is cellulitis across the forefoot and the 2 dorsal surgical incisions are indurated. Review of the MRI scan shows gas in the soft tissue of the forefoot shows the destructive changes of the fifth metatarsal and shows chronic osteomyelitis of the base of the second toe.  Assessment: Assessment: Diabetic insensate neuropathy with history of tobacco use with peripheral vascular disease status post stent placement with previous resections of metatarsal heads 2 , 3,  4 and 5 with persistent osteomyelitis of the second toe and fifth metatarsal.  Plan: [name] reluctant to proceed with a transmetatarsal amputation which in my opinion is his only surgical option for foot salvage. [name] concerned that this will affect his golf swing. Discussed that we could continue with his IV antibiotics over the next 4 days and if he is not showing any improvement that his only option for foot salvage would be a transmetatarsal amputation. Plan to reevaluate on Thursday. Discussed that if we did proceed with surgery this would be on Friday at [facility].  Thank you for the consult and the opportunity to see [name], MD [facility] [phone] 8:19 PM

## 2017-02-17 NOTE — Progress Notes (Signed)
PROGRESS NOTE    Curtis Bradford  ZOX:[id number] DOB: 02-16-1961 DOA: 02/14/2017 PCP: Patient, No Pcp Per  Brief Narrative: Curtis Bradford is a 56 year old male with history of diabetes on insulin, ongoing tobacco abuse, CAD status post PCI/stent, right leg peripheral arterial disease status post PCI and stenting, chronic right foot wound for over 4-6 months now, has been having ongoing care for this at the [facility] in [place], presented to our emergency room 8/23 night with increased pain, fevers and chills. Patient has had a complicated medical history with regards to his right foot, diagnosed with peripheral arterial disease and got a stent to an artery in his right leg in 2016. In February 2018 is when he started having issues with wounds in his right foot during which an angiogram that revealed stenosis of prior stent, subsequently underwent PCI and 3 new stents were placed in his right leg in February 2018 at [facility] in [place]. After this was also treated with a few weeks of antibiotics via a PICC line under the care of an infectious disease doctor. From May 2018 onwards he was being followed weekly at a wound care center in [facility] in [place], [place] and was getting local wound care, however was noted to have exposed bone subsequently saw an orthopedic surgeon and he underwent excision of the second,  third and fifth metatarsal heads and extensor tenotomies to the second third and fifth toes as well as excisional debridement of plantar ulcer under the second metatarsal head on 01/23/17 by Dr. Kirk Ruths. After this had debridement again on Aug 13 and during this time was on few weeks of IV Zosyn and Daptomycin via PICC line, then decided to move to [facility] and PICC was removed by his treating MDs on 8/14.  Assessment & Plan:   Complex Diabetic foot wound with Osteomyelitis -previously treated for osteomyelitis with long courses of IV antibiotics,    -status post excision of the second second third and fifth metatarsal heads and extensor tenotomies to the second third and fifth toes as well as excisional debridement of plantar ulcer on 01/23/17 -I have requested records from [facility] in [place], and information regarding revascularization and stenting [facility], received some recs, awaiting rest -one note indicates Surgical Cx 8/1 with prelim + for Staph aureus and previous tissue and Bone Cx from 7/24 with prevotelia bivia and staph caprae -I will call wound center tomorrow and attempt to contact ID/Wound MD Dr.Luu -Continue broad-spectrum IV antibiotics vancomycin/Zosyn -Blood cultures -NGTD -MRI of the right foot concerning for abnormal signal at site of previous surgical site, and osteomyelitis -ABI of right foot with mild decreased flow, s/p Angioplasty and 3 stents to artery in R leg in 07/2016 -Orthopedics consult per Dr.Xu appreciated, he thinks he may need TMT, await input from Dr.Duda on Monday -ID consult per Dr.Campbell greatly appreciated    Type II diabetes mellitus with renal manifestations (HCC) -Continue Lantus, sliding scale, hemoglobin A1c is 7.0 -stable    Ongoing tobacco abuse -Counseled   PAD right leg -Status post PCI and stenting 3 in 07/2016 and previously in 2016 -Continue aspirin, Plavix, statin -ABIs and Doppler waveforms indicate a mild reduction in arterial flow bilaterally Have requested records   History of CAD -Remote PCI and stent -Continue aspirin, Plavix, statin  History of seizures -Last more than 2 years ago -Continue Keppra    CKD3 -Creatinine at 1.3-1.4 which is probably close to his  baseline, monitor with vancomycin use -stable, stop IVF  DVT prophylaxis: subcutaneous heparin Code Status:  full code  Family Communication: currently staying in a hotel  Disposition Plan:  pending workup/improvement   Consultants:   Orthopedics Dr.  Roda Shutters    Procedures:  Antimicrobials:   8/23: Vanc/Zosyn    Subjective: -feels better, ambulating some, no dyspnea, pain improving  Objective: Vitals:   02/16/17 0531 02/16/17 1340 02/16/17 2101 02/17/17 0514  BP: 139/74 (!) 151/77 (!) 153/76 118/64  Pulse: 71 79 76 70  Resp: 20 18 18 18   Temp: 98.6 F (37 C) 98.4 F (36.9 C) 98.7 F (37.1 C) 98.2 F (36.8 C)  TempSrc: Oral Oral Oral Oral  SpO2: 97% 100% 99% 98%  Weight:  88.1 kg (194 lb 3.6 oz)    Height:        Intake/Output Summary (Last 24 hours) at 02/17/17 1201 Last data filed at 02/17/17 1153  Gross per 24 hour  Intake          2691.25 ml  Output                0 ml  Net          2691.25 ml   Filed Weights   02/14/17 1623 02/15/17 0140 02/16/17 1340  Weight: 86.2 kg (190 lb) 93.4 kg (205 lb 14.6 oz) 88.1 kg (194 lb 3.6 oz)    Examination:  Gen: Awake, Alert, Oriented X 3, no distress HEENT: PERRLA, Neck supple, no JVD Lungs: Good air movement bilaterally, CTAB CVS: RRR,No Gallops,Rubs or new Murmurs Abd: soft, Non tender, non distended, BS present Extremities: : Swollen and deformed right foot, redness and swelling on the dorsum with 2 ulcers, one ulcer under second metatarsal bone and one at the base of the fifth metatarsal with some slough in the center and dried exudate around it. Dorsal surface of the foot with 2 incisions which appear unremarkable Skin as above    Data Reviewed:   CBC:  Recent Labs Lab 02/10/17 2325 02/14/17 1627 02/15/17 0610 02/16/17 0534 02/17/17 0554  WBC 10.6* 13.9* 11.6* 9.9 7.8  NEUTROABS 6.3 12.3*  --   --   --   HGB 14.5 13.8 11.9* 11.1* 11.5*  HCT 40.8 40.0 35.1* 32.7* 33.4*  MCV 87.9 87.5 87.3 89.1 87.0  PLT 308 276 226 208 227   Basic Metabolic Panel:  Recent Labs Lab 02/10/17 2325 02/14/17 1627 02/15/17 0610 02/16/17 0535 02/17/17 0554  NA 138 134* 134*  --  136  K 3.8 4.2 3.6  --  3.7  CL 103 103 106  --  105  CO2 27 25 20*  --  25  GLUCOSE  143* 213* 147*  --  128*  BUN 23* 22* 18  --  19  CREATININE 1.45* 1.44* 1.38* 1.56* 1.45*  CALCIUM 9.1 9.0 7.9*  --  8.5*   GFR: Estimated Creatinine Clearance: 69.6 mL/min (A) (by C-G formula based on SCr of 1.45 mg/dL (H)). Liver Function Tests:  Recent Labs Lab 02/14/17 1627 02/15/17 0610  AST 14* 15  ALT 16* 19  ALKPHOS 72 59  BILITOT 0.8 0.7  PROT 7.3 6.0*  ALBUMIN 3.8 3.0*   No results for input(s): LIPASE, AMYLASE in the last 168 hours. No results for input(s): AMMONIA in the last 168 hours. Coagulation Profile:  Recent Labs Lab 02/15/17 0043  INR 1.19   Cardiac Enzymes: No results for input(s): CKTOTAL, CKMB, CKMBINDEX, TROPONINI in the last 168 hours.  BNP (last 3 results) No results for input(s): PROBNP in the last 8760 hours. HbA1C:  Recent Labs  02/15/17 0606  HGBA1C 7.0*   CBG:  Recent Labs Lab 02/16/17 1121 02/16/17 1704 02/16/17 2059 02/17/17 0727 02/17/17 1123  GLUCAP 173* 166* 168* 118* 183*   Lipid Profile: No results for input(s): CHOL, HDL, LDLCALC, TRIG, CHOLHDL, LDLDIRECT in the last 72 hours. Thyroid Function Tests: No results for input(s): TSH, T4TOTAL, FREET4, T3FREE, THYROIDAB in the last 72 hours. Anemia Panel: No results for input(s): VITAMINB12, FOLATE, FERRITIN, TIBC, IRON, RETICCTPCT in the last 72 hours. Urine analysis:    Component Value Date/Time   COLORURINE YELLOW 02/14/2017 1942   APPEARANCEUR CLEAR 02/14/2017 1942   LABSPEC 1.024 02/14/2017 1942   PHURINE 5.0 02/14/2017 1942   GLUCOSEU 150 (A) 02/14/2017 1942   HGBUR MODERATE (A) 02/14/2017 1942   BILIRUBINUR NEGATIVE 02/14/2017 1942   KETONESUR NEGATIVE 02/14/2017 1942   PROTEINUR 100 (A) 02/14/2017 1942   NITRITE NEGATIVE 02/14/2017 1942   LEUKOCYTESUR NEGATIVE 02/14/2017 1942   Sepsis Labs: @LABRCNTIP (procalcitonin:4,lacticidven:4)  ) Recent Results (from the past 240 hour(s))  Blood Culture (routine x 2)     Status: None (Preliminary result)    Collection Time: 02/14/17  9:57 PM  Result Value Ref Range Status   Specimen Description BLOOD BLOOD RIGHT FOREARM  Final   Special Requests   Final    BOTTLES DRAWN AEROBIC AND ANAEROBIC Blood Culture adequate volume   Culture   Final    NO GROWTH 2 DAYS Performed at [facility], [address], [place], [place] [address]    Report Status PENDING  Incomplete  Blood Culture (routine x 2)     Status: None (Preliminary result)   Collection Time: 02/14/17  9:57 PM  Result Value Ref Range Status   Specimen Description BLOOD LEFT ANTECUBITAL  Final   Special Requests   Final    BOTTLES DRAWN AEROBIC AND ANAEROBIC Blood Culture adequate volume   Culture   Final    NO GROWTH 2 DAYS Performed at [facility], [address] [place], [place] [address]    Report Status PENDING  Incomplete         Radiology Studies: Mr Foot Right Wo Contrast  Result Date: 02/15/2017 CLINICAL DATA:  Diabetic foot ulcer. EXAM: MRI OF THE RIGHT FOREFOOT WITHOUT CONTRAST TECHNIQUE: Multiplanar, multisequence MR imaging of the right forefoot was performed. No intravenous contrast was administered. COMPARISON:  None. FINDINGS: Bones/Joint/Cartilage Prior transmetatarsal amputation of the second, third, fourth and fifth metatarsal heads. Severe soft tissue swelling at the metatarsal resection site with multiple low signal foci within the resection site concerning for air. Bone marrow edema within the second, third and fifth metatarsals distally. Cortical irregularity an abnormal bone marrow throughout the second proximal phalanx with surrounding soft tissue prominence most concerning for osteomyelitis. Mild osteoarthritis of the first MTP joint. Bipartite medial hallux sesamoid. Normal alignment. No joint effusion. Ligaments Collateral ligaments are intact. Muscles and Tendons Mild T2 hyperintense signal throughout the plantar musculature likely neurogenic. Soft tissue No soft tissue mass.  No drainable  fluid collection or hematoma. IMPRESSION: 1. Prior transmetatarsal amputation of the second, third, fourth and fifth metatarsal heads. Severe soft tissue swelling at the metatarsal resection site with multiple low signal foci within the resection site concerning for air and bone marrow edema within the second, third and fifth metatarsals distally. These may reflect postsurgical changes versus persistent cellulitis and osteomyelitis. 2. Cortical irregularity  an abnormal bone marrow throughout the second proximal phalanx with surrounding soft tissue prominence most concerning for osteomyelitis. Electronically Signed   By: Elige Ko   On: 02/15/2017 14:24        Scheduled Meds: . aspirin EC  81 mg Oral QHS  . atorvastatin  20 mg Oral QHS  . clopidogrel  75 mg Oral QHS  . fluconazole  100 mg Oral Daily  . heparin  5,000 Units Subcutaneous Q8H  . insulin aspart  0-9 Units Subcutaneous TID WC  . insulin glargine  25 Units Subcutaneous QHS  . levETIRAcetam  1,000 mg Oral QHS  . nicotine  21 mg Transdermal Daily  . silver sulfADIAZINE  1 application Topical QHS   Continuous Infusions: . sodium chloride 75 mL/hr at 02/16/17 0400  . piperacillin-tazobactam (ZOSYN)  IV Stopped (02/17/17 1017)  . vancomycin 1,000 mg (02/17/17 1006)     LOS: 3 days    Time spent:    Zannie Cove, MD [facility] Pager [phone]  If 7PM-7AM, please contact night-coverage [email or web address] Password [email or web address] 02/17/2017, 12:01 PM

## 2017-02-18 LAB — CREATININE, SERUM
Creatinine, Ser: 1.37 mg/dL — ABNORMAL HIGH (ref 0.61–1.24)
GFR calc non Af Amer: 57 mL/min — ABNORMAL LOW (ref >60)

## 2017-02-18 LAB — GLUCOSE, CAPILLARY
GLUCOSE-CAPILLARY: 170 mg/dL — AB (ref 65–99)
GLUCOSE-CAPILLARY: 192 mg/dL — AB (ref 65–99)
GLUCOSE-CAPILLARY: 245 mg/dL — AB (ref 65–99)
Glucose-Capillary: 189 mg/dL — ABNORMAL HIGH (ref 65–99)

## 2017-02-18 MED ORDER — INSULIN ASPART 100 UNIT/ML ~~LOC~~ SOLN
0.0000 [IU] | Freq: Three times a day (TID) | SUBCUTANEOUS | Status: DC
  Administered 2017-02-18 – 2017-02-19 (×4): 3 [IU] via SUBCUTANEOUS
  Administered 2017-02-19: 5 [IU] via SUBCUTANEOUS
  Administered 2017-02-20: 3 [IU] via SUBCUTANEOUS
  Administered 2017-02-20: 5 [IU] via SUBCUTANEOUS
  Administered 2017-02-20: 2 [IU] via SUBCUTANEOUS
  Administered 2017-02-21: 5 [IU] via SUBCUTANEOUS
  Administered 2017-02-21: 3 [IU] via SUBCUTANEOUS
  Administered 2017-02-21 – 2017-02-22 (×3): 2 [IU] via SUBCUTANEOUS
  Administered 2017-02-22: 3 [IU] via SUBCUTANEOUS
  Administered 2017-02-23 – 2017-02-26 (×8): 2 [IU] via SUBCUTANEOUS

## 2017-02-18 NOTE — Progress Notes (Signed)
[facility]    Date of Admission:  02/14/2017   Total days of antibiotics 5        Day 5 vanco/piptazo/fluc   ID: Curtis Bradford is a 56 y.o. male with hx of DM, PAD, DFU with multiple debridement to right foot s/p I X D on 8/1 and 8/13 and treated with 2 wk of dapto plus piptazo - he has persistent osteo of 2nd toe and 5th MTH Principal Problem:   Diabetic foot infection (HCC) Active Problems:   Type II diabetes mellitus with renal manifestations (HCC)   HLD (hyperlipidemia)   Tobacco abuse   CKD (chronic kidney disease), stage III   Sepsis (HCC)   Type 2 diabetes mellitus with diabetic foot ulcer (HCC)   CAD (coronary artery disease)   Seizure (HCC)   Peripheral neuropathy   Peripheral artery disease (HCC)   Subacute osteomyelitis, right ankle and foot (HCC)    Subjective: afebrile  Medications:  . aspirin EC  81 mg Oral QHS  . atorvastatin  20 mg Oral QHS  . heparin  5,000 Units Subcutaneous Q8H  . insulin aspart  0-15 Units Subcutaneous TID WC  . insulin glargine  25 Units Subcutaneous QHS  . levETIRAcetam  1,000 mg Oral QHS  . nicotine  21 mg Transdermal Daily  . silver sulfADIAZINE  1 application Topical QHS    Objective: Vital signs in last 24 hours: Temp:  [98.1 F (36.7 C)-98.3 F (36.8 C)] 98.1 F (36.7 C) (08/27 0603) Pulse Rate:  [64-74] 74 (08/27 0603) Resp:  [18] 18 (08/27 0603) BP: (128-150)/(64-67) 128/64 (08/27 0603) SpO2:  [95 %-99 %] 95 % (08/27 0603) Physical Exam  Constitutional: He is oriented to person, place, and time. He appears well-developed and well-nourished. No distress.  HENT:  Mouth/Throat: Oropharynx is clear and moist. No oropharyngeal exudate.  Cardiovascular: Normal rate, regular rhythm and normal heart sounds. Exam reveals no gallop and no friction rub.  No murmur heard.  Pulmonary/Chest: Effort normal and breath sounds normal. No respiratory distress. He has no wheezes.  Abdominal: Soft. Bowel  sounds are normal. He exhibits no distension. There is no tenderness.  Ext: left foot, cooler than right foot - right foot swelling and warmth to touch Skin: 2 ulcers to dorsum of right foot Psychiatric: He has a normal mood and affect. His behavior is normal.    Lab Results  Recent Labs  02/16/17 0534  02/17/17 0554 02/18/17 0514  WBC 9.9  --  7.8  --   HGB 11.1*  --  11.5*  --   HCT 32.7*  --  33.4*  --   NA  --   --  136  --   K  --   --  3.7  --   CL  --   --  105  --   CO2  --   --  25  --   BUN  --   --  19  --   CREATININE  --   < > 1.45* 1.37*  < > = values in this interval not displayed. Lab Results  Component Value Date   ESRSEDRATE 31 (H) 02/14/2017   Lab Results  Component Value Date   CRP 12.3 (H) 02/14/2017    Microbiology: 8/23 blood cx ngtd Studies/Results: No results found.   Assessment/Plan: Right foot dfu/osteo = agree with dr duda's assessment that Curtis Bradford amputation would be curative since he has failed to heal after multiple  debridements with IV abtx courses. For now bridge with current abtx  Drue Second [facility] Cell: [phone] Pager: [phone]  02/18/2017, 1:45 PM

## 2017-02-18 NOTE — Progress Notes (Signed)
PROGRESS NOTE    Curtis Bradford  ZOX:[id number] DOB: 19-Jul-1960 DOA: 02/14/2017 PCP: Patient, No Pcp Per  Brief Narrative: Curtis Bradford is a 56 year old male with history of diabetes on insulin, ongoing tobacco abuse, CAD status post PCI/stent, right leg peripheral arterial disease status post PCI and stenting, chronic right foot wound for over 4-6 months now, has been having ongoing care for this at the [facility] in [place], presented to our emergency room 8/23 night with increased pain, fevers and chills. Patient has had a complicated medical history with regards to his right foot, diagnosed with peripheral arterial disease and got a stent to an artery in his right leg in 2016. In February 2018 is when he started having issues with wounds in his right foot during which an angiogram revealed stenosis of prior stent, subsequently underwent PCI -3 new stents were placed in his right leg in February 2018 at [facility] in [place]. After this was also treated with a few weeks of antibiotics via a PICC line under the care of an infectious disease doctor. From May 2018 onwards he was being followed weekly at a wound care center in [facility] in [place], [place] and was getting local wound care, however was noted to have exposed bone subsequently saw an orthopedic surgeon and he underwent excision of the second,  third and fifth metatarsal heads and extensor tenotomies to the second third and fifth toes as well as excisional debridement of plantar ulcer under the second metatarsal head on 01/23/17 by Dr. Kirk Ruths. After this had debridement again on Aug 13 and from Aug first was started on IV Zosyn and Daptomycin via PICC line, and got this for 2 weeks then decided to move to [facility] and PICC was removed by his treating MDs on 8/14.  Assessment & Plan:   Complex Diabetic foot wound with Osteomyelitis -previously treated for osteomyelitis with long courses of IV  antibiotics,  -status post excision of the second second third and fifth metatarsal heads and extensor tenotomies to the second third and fifth toes as well as excisional debridement of plantar ulcer on 01/23/17 -I have requested records from [facility] in [place], and information regarding revascularization and stenting [facility], received some recs, awaiting CUlture data -one note indicates Surgical Cx 8/1 with prelim + for Staph aureus and previous tissue and Bone Cx from 7/24 with prevotelia bivia and staph caprae -I called Wound Center Dr.Luu's -ID office this am and requested culture data to be faxed over -Continue broad-spectrum IV antibiotics vancomycin/Zosyn -Blood cultures -NGTD -MRI of the right foot concerning for abnormal signal at site of previous surgical site, and osteomyelitis -ABI of right foot with mild decreased flow, s/p Angioplasty and 3 stents to artery in R leg in 07/2016 -Orthopedics consult per Dr.Xu appreciated, seen by Dr.DUda last night, felt that may need TMT-continue Abx in the interim to eval for response -ID consult greatly appreciated    Type II diabetes mellitus with renal manifestations (HCC) -Continue Lantus, sliding scale, hemoglobin A1c is 7.0 -stable, change to mod scale    Ongoing tobacco abuse -Counseled   PAD right leg -Status post PCI and stenting 3 in 07/2016 and previously in 2016 -Continue aspirin, Plavix, statin -ABIs and Doppler waveforms indicate a mild reduction in arterial flow bilaterally Have requested records   History of CAD -Remote PCI and stent -Continue aspirin, Plavix, statin  History of seizures -Last more than 2 years ago -Continue  Keppra    CKD3 -Creatinine at 1.3-1.4 which is probably close to his baseline, monitor with vancomycin use -stable, stop IVF  DVT prophylaxis: subcutaneous heparin Code Status:  full code  Family Communication: currently staying in a hotel  Disposition Plan:   pending workup/improvement   Consultants:   Orthopedics Dr. Roda Shutters    Procedures:  Antimicrobials:   8/23: Vanc/Zosyn    Subjective: -feels ok, wondering about amputation  Objective: Vitals:   02/17/17 0514 02/17/17 1334 02/17/17 2113 02/18/17 0603  BP: 118/64 132/63 (!) 150/67 128/64  Pulse: 70 64 64 74  Resp: 18 18 18 18   Temp: 98.2 F (36.8 C) 98.3 F (36.8 C) 98.3 F (36.8 C) 98.1 F (36.7 C)  TempSrc: Oral Oral Oral Oral  SpO2: 98% 99% 99% 95%  Weight:      Height:        Intake/Output Summary (Last 24 hours) at 02/18/17 1213 Last data filed at 02/18/17 0700  Gross per 24 hour  Intake             1030 ml  Output                0 ml  Net             1030 ml   Filed Weights   02/14/17 1623 02/15/17 0140 02/16/17 1340  Weight: 86.2 kg (190 lb) 93.4 kg (205 lb 14.6 oz) 88.1 kg (194 lb 3.6 oz)    Examination:  Gen: Awake, Alert, Oriented X 3,  HEENT: PERRLA, Neck supple, no JVD Lungs: Good air movement bilaterally, CTAB CVS: RRR,No Gallops,Rubs or new Murmurs Abd: soft, Non tender, non distended, BS present Extremities: : Swollen and deformed right foot, redness and swelling on the dorsum with 2 ulcers, one ulcer under second metatarsal bone and one at the base of the fifth metatarsal with some slough in the center and dried exudate around it. Dorsal surface of the foot with 2 incisions which appear unremarkable Skin: as above    Data Reviewed:   CBC:  Recent Labs Lab 02/14/17 1627 02/15/17 0610 02/16/17 0534 02/17/17 0554  WBC 13.9* 11.6* 9.9 7.8  NEUTROABS 12.3*  --   --   --   HGB 13.8 11.9* 11.1* 11.5*  HCT 40.0 35.1* 32.7* 33.4*  MCV 87.5 87.3 89.1 87.0  PLT 276 226 208 227   Basic Metabolic Panel:  Recent Labs Lab 02/14/17 1627 02/15/17 0610 02/16/17 0535 02/17/17 0554 02/18/17 0514  NA 134* 134*  --  136  --   K 4.2 3.6  --  3.7  --   CL 103 106  --  105  --   CO2 25 20*  --  25  --   GLUCOSE 213* 147*  --  128*  --   BUN  22* 18  --  19  --   CREATININE 1.44* 1.38* 1.56* 1.45* 1.37*  CALCIUM 9.0 7.9*  --  8.5*  --    GFR: Estimated Creatinine Clearance: 73.7 mL/min (A) (by C-G formula based on SCr of 1.37 mg/dL (H)). Liver Function Tests:  Recent Labs Lab 02/14/17 1627 02/15/17 0610  AST 14* 15  ALT 16* 19  ALKPHOS 72 59  BILITOT 0.8 0.7  PROT 7.3 6.0*  ALBUMIN 3.8 3.0*   No results for input(s): LIPASE, AMYLASE in the last 168 hours. No results for input(s): AMMONIA in the last 168 hours. Coagulation Profile:  Recent Labs Lab 02/15/17 0043  INR 1.19  Cardiac Enzymes: No results for input(s): CKTOTAL, CKMB, CKMBINDEX, TROPONINI in the last 168 hours. BNP (last 3 results) No results for input(s): PROBNP in the last 8760 hours. HbA1C: No results for input(s): HGBA1C in the last 72 hours. CBG:  Recent Labs Lab 02/17/17 1123 02/17/17 1656 02/17/17 2136 02/18/17 0730 02/18/17 1147  GLUCAP 183* 142* 219* 170* 189*   Lipid Profile: No results for input(s): CHOL, HDL, LDLCALC, TRIG, CHOLHDL, LDLDIRECT in the last 72 hours. Thyroid Function Tests: No results for input(s): TSH, T4TOTAL, FREET4, T3FREE, THYROIDAB in the last 72 hours. Anemia Panel: No results for input(s): VITAMINB12, FOLATE, FERRITIN, TIBC, IRON, RETICCTPCT in the last 72 hours. Urine analysis:    Component Value Date/Time   COLORURINE YELLOW 02/14/2017 1942   APPEARANCEUR CLEAR 02/14/2017 1942   LABSPEC 1.024 02/14/2017 1942   PHURINE 5.0 02/14/2017 1942   GLUCOSEU 150 (A) 02/14/2017 1942   HGBUR MODERATE (A) 02/14/2017 1942   BILIRUBINUR NEGATIVE 02/14/2017 1942   KETONESUR NEGATIVE 02/14/2017 1942   PROTEINUR 100 (A) 02/14/2017 1942   NITRITE NEGATIVE 02/14/2017 1942   LEUKOCYTESUR NEGATIVE 02/14/2017 1942   Sepsis Labs: @LABRCNTIP (procalcitonin:4,lacticidven:4)  ) Recent Results (from the past 240 hour(s))  Blood Culture (routine x 2)     Status: None (Preliminary result)   Collection Time: 02/14/17   9:57 PM  Result Value Ref Range Status   Specimen Description BLOOD BLOOD RIGHT FOREARM  Final   Special Requests   Final    BOTTLES DRAWN AEROBIC AND ANAEROBIC Blood Culture adequate volume   Culture   Final    NO GROWTH 2 DAYS Performed at [facility], [address] [place], [place] [address]    Report Status PENDING  Incomplete  Blood Culture (routine x 2)     Status: None (Preliminary result)   Collection Time: 02/14/17  9:57 PM  Result Value Ref Range Status   Specimen Description BLOOD LEFT ANTECUBITAL  Final   Special Requests   Final    BOTTLES DRAWN AEROBIC AND ANAEROBIC Blood Culture adequate volume   Culture   Final    NO GROWTH 2 DAYS Performed at [facility], [address] [place], [place] [address]    Report Status PENDING  Incomplete         Radiology Studies: No results found.      Scheduled Meds: . aspirin EC  81 mg Oral QHS  . atorvastatin  20 mg Oral QHS  . heparin  5,000 Units Subcutaneous Q8H  . insulin aspart  0-15 Units Subcutaneous TID WC  . insulin glargine  25 Units Subcutaneous QHS  . levETIRAcetam  1,000 mg Oral QHS  . nicotine  21 mg Transdermal Daily  . silver sulfADIAZINE  1 application Topical QHS   Continuous Infusions: . piperacillin-tazobactam (ZOSYN)  IV Stopped (02/18/17 1031)  . vancomycin Stopped (02/18/17 1043)     LOS: 4 days    Time spent:    Zannie Cove, MD [facility] Pager [phone]  If 7PM-7AM, please contact night-coverage [email or web address] Password [facility] 02/18/2017, 12:13 PM

## 2017-02-18 NOTE — Care Management Note (Signed)
Case Management Note  Patient Details  Name: Yahya Rosenberg MRN: [id number] Date of Birth: 16-Aug-1960  Subjective/Objective:                  [name] reluctant to proceed with a transmetatarsal amputation which in my opinion is his only surgical option for foot salvage. [name] concerned that this will affect his golf swing. Discussed that we could continue with his IV antibiotics over the next 4 days and if he is not showing any improvement that his only option for foot salvage would be a transmetatarsal amputation. Plan to reevaluate on Thursday. Discussed that if we did proceed with surgery this would be on Friday at [facility].  Action/Plan: Date:  February 18, 2017  Chart reviewed for concurrent status and case management needs.  Will continue to follow patient progress.  Discharge Planning: following for needs  Expected discharge date: [id number]  Marcelle Smiling, BSN, [place], [place]   [phone]   Expected Discharge Date:   (unknown)               Expected Discharge Plan:  Home/Self Care  In-House Referral:     Discharge planning Services  CM Consult  Post Acute Care Choice:    Choice offered to:     DME Arranged:    DME Agency:     HH Arranged:    HH Agency:     Status of Service:  In process, will continue to follow  If discussed at Long Length of Stay Meetings, dates discussed:    Additional Comments:  Golda Acre, RN 02/18/2017, 9:23 AM

## 2017-02-19 LAB — CREATININE, SERUM
CREATININE: 1.41 mg/dL — AB (ref 0.61–1.24)
GFR calc Af Amer: >60 mL/min (ref >60)
GFR calc non Af Amer: 55 mL/min — ABNORMAL LOW (ref >60)

## 2017-02-19 LAB — GLUCOSE, CAPILLARY
GLUCOSE-CAPILLARY: 217 mg/dL — AB (ref 65–99)
GLUCOSE-CAPILLARY: 193 mg/dL — AB (ref 65–99)
Glucose-Capillary: 189 mg/dL — ABNORMAL HIGH (ref 65–99)
Glucose-Capillary: 243 mg/dL — ABNORMAL HIGH (ref 65–99)

## 2017-02-19 MED ORDER — SODIUM CHLORIDE 0.9 % IV SOLN
INTRAVENOUS | Status: DC
  Administered 2017-02-22: 22:00:00 via INTRAVENOUS

## 2017-02-19 NOTE — Progress Notes (Signed)
PROGRESS NOTE    Curtis Bradford  WJX:[id number] DOB: 01/06/1961 DOA: 02/14/2017 PCP: Patient, No Pcp Per  Brief Narrative: Curtis Bradford is a 56 year old male with history of diabetes on insulin, ongoing tobacco abuse, CAD status post PCI/stent, right leg peripheral arterial disease status post PCI and stenting, chronic right foot wound for over 4-6 months now, has been having ongoing care for this at the [facility] in [place], presented to our emergency room 8/23 night with increased pain, fevers and chills. Patient has had a complicated medical history with regards to his right foot, diagnosed with peripheral arterial disease and got a stent to an artery in his right leg in 2016. In February 2018 is when he started having issues with wounds in his right foot during which an angiogram revealed stenosis of prior stent, subsequently underwent PCI -3 new stents were placed in his right leg in February 2018 at [facility] in [place]. After this was also treated with a few weeks of antibiotics via a PICC line under the care of an infectious disease doctor. From May 2018 onwards he was being followed weekly at a wound care center in [facility] in [place], [place] and was getting local wound care, however was noted to have exposed bone subsequently saw an orthopedic surgeon and he underwent excision of the second,  third and fifth metatarsal heads and extensor tenotomies to the second third and fifth toes as well as excisional debridement of plantar ulcer under the second metatarsal head on 01/23/17 by Dr. Kirk Ruths. After this had debridement again on Aug 13 and from Aug first was started on IV Zosyn and Daptomycin via PICC line, and got this for 2 weeks then decided to move to [facility] and PICC was removed by his treating MDs on 8/14. Now on Abx, MRI with Osteo, ID and Dr.Duda following  Assessment & Plan:   Complex Diabetic foot wound with Osteomyelitis -previously  treated for osteomyelitis with long courses of IV antibiotics,  -status post excision of the second second third and fifth metatarsal heads and extensor tenotomies to the second third and fifth toes as well as excisional debridement of plantar ulcer on 01/23/17 -I have requested records from [facility] in [place], and information regarding revascularization and stenting [facility], received some records, awaiting CUlture data -I called and spoke to Dr.Luu at the Wound center 8/27 who was his ID MD as well, she reports that he grew MSSA in one wound Cx and Coag negative staph twice  ( Staph caprae and prevotelia bivia) also in wound culture-but she questioned the value of this since he was on Abx during these times -Continue broad-spectrum IV antibiotics vancomycin/Zosyn Day 6 now -Blood cultures -NGTD -MRI of the right foot concerning for abnormal signal at site of previous surgical site, and osteomyelitis -ABI of right foot with mild decreased flow, s/p Angioplasty and 3 stents to artery in R leg in 07/2016 -Orthopedics consult per Dr.Xu appreciated, seen by Dr.DUda 8/26 night, felt that may need TMT-continue Abx in the interim to eval for response, he will re-assess on Thursday-pt more open to considering TMT now -ID consult greatly appreciated    Type II diabetes mellitus with renal manifestations (HCC) -Continue Lantus, sliding scale, hemoglobin A1c is 7.0 -stable, change to mod scale    Ongoing tobacco abuse -Counseled   PAD right leg -Status post PCI and stenting 3 in 07/2016 and previously in 2016 -Continue aspirin, Plavix, statin -  ABIs and Doppler waveforms indicate a mild reduction in arterial flow bilaterally Have requested records   History of CAD -Remote PCI and stent -Continue aspirin, Plavix, statin  History of seizures -Last more than 2 years ago -Continue Keppra    CKD3 -Creatinine at 1.3-1.4 which is probably close to his baseline, monitor  with vancomycin use -stable now off IVF  DVT prophylaxis: subcutaneous heparin Code Status:  full code  Family Communication: currently staying in a hotel  Disposition Plan:  Dr.Duda to eval Thursday for TMT  Consultants:   Orthopedics Dr. Roda Shutters    Procedures:  Antimicrobials:   8/23: Vanc/Zosyn    Subjective: -no complaints, feels better, pain/swelling improving, more open to surgery now  Objective: Vitals:   02/18/17 0603 02/18/17 1449 02/18/17 2132 02/19/17 0509  BP: 128/64 127/85 (!) 154/86 128/79  Pulse: 74 86 73 70  Resp: 18 18 20 16   Temp: 98.1 F (36.7 C) 97.8 F (36.6 C) 98.1 F (36.7 C) 98.4 F (36.9 C)  TempSrc: Oral Oral Oral Oral  SpO2: 95% 96% 99% 96%  Weight:      Height:        Intake/Output Summary (Last 24 hours) at 02/19/17 1317 Last data filed at 02/19/17 1249  Gross per 24 hour  Intake              720 ml  Output                0 ml  Net              720 ml   Filed Weights   02/14/17 1623 02/15/17 0140 02/16/17 1340  Weight: 86.2 kg (190 lb) 93.4 kg (205 lb 14.6 oz) 88.1 kg (194 lb 3.6 oz)    Examination:  Gen: Awake, Alert, Oriented X 3,  HEENT: PERRLA, Neck supple, no JVD Lungs: Good air movement bilaterally, CTAB CVS: RRR,No Gallops,Rubs or new Murmurs Abd: soft, Non tender, non distended, BS present Extremities: : Swollen and deformed right foot, redness and swelling on the dorsum with 2 ulcers-much improved, one ulcer under second metatarsal bone and one at the base of the fifth metatarsal with some slough in the center and dried exudate around it-more dry now. Dorsal surface of the foot with 2 incisions which appear unremarkable, erythema on the dorsum-improving    Data Reviewed:   CBC:  Recent Labs Lab 02/14/17 1627 02/15/17 0610 02/16/17 0534 02/17/17 0554  WBC 13.9* 11.6* 9.9 7.8  NEUTROABS 12.3*  --   --   --   HGB 13.8 11.9* 11.1* 11.5*  HCT 40.0 35.1* 32.7* 33.4*  MCV 87.5 87.3 89.1 87.0  PLT 276 226 208 227     Basic Metabolic Panel:  Recent Labs Lab 02/14/17 1627 02/15/17 0610 02/16/17 0535 02/17/17 0554 02/18/17 0514 02/19/17 0541  NA 134* 134*  --  136  --   --   K 4.2 3.6  --  3.7  --   --   CL 103 106  --  105  --   --   CO2 25 20*  --  25  --   --   GLUCOSE 213* 147*  --  128*  --   --   BUN 22* 18  --  19  --   --   CREATININE 1.44* 1.38* 1.56* 1.45* 1.37* 1.41*  CALCIUM 9.0 7.9*  --  8.5*  --   --    GFR: Estimated Creatinine Clearance: 71.6 mL/min (A) (  by C-G formula based on SCr of 1.41 mg/dL (H)). Liver Function Tests:  Recent Labs Lab 02/14/17 1627 02/15/17 0610  AST 14* 15  ALT 16* 19  ALKPHOS 72 59  BILITOT 0.8 0.7  PROT 7.3 6.0*  ALBUMIN 3.8 3.0*   No results for input(s): LIPASE, AMYLASE in the last 168 hours. No results for input(s): AMMONIA in the last 168 hours. Coagulation Profile:  Recent Labs Lab 02/15/17 0043  INR 1.19   Cardiac Enzymes: No results for input(s): CKTOTAL, CKMB, CKMBINDEX, TROPONINI in the last 168 hours. BNP (last 3 results) No results for input(s): PROBNP in the last 8760 hours. HbA1C: No results for input(s): HGBA1C in the last 72 hours. CBG:  Recent Labs Lab 02/18/17 1147 02/18/17 1655 02/18/17 2135 02/19/17 0729 02/19/17 1128  GLUCAP 189* 192* 245* 217* 193*   Lipid Profile: No results for input(s): CHOL, HDL, LDLCALC, TRIG, CHOLHDL, LDLDIRECT in the last 72 hours. Thyroid Function Tests: No results for input(s): TSH, T4TOTAL, FREET4, T3FREE, THYROIDAB in the last 72 hours. Anemia Panel: No results for input(s): VITAMINB12, FOLATE, FERRITIN, TIBC, IRON, RETICCTPCT in the last 72 hours. Urine analysis:    Component Value Date/Time   COLORURINE YELLOW 02/14/2017 1942   APPEARANCEUR CLEAR 02/14/2017 1942   LABSPEC 1.024 02/14/2017 1942   PHURINE 5.0 02/14/2017 1942   GLUCOSEU 150 (A) 02/14/2017 1942   HGBUR MODERATE (A) 02/14/2017 1942   BILIRUBINUR NEGATIVE 02/14/2017 1942   KETONESUR NEGATIVE 02/14/2017  1942   PROTEINUR 100 (A) 02/14/2017 1942   NITRITE NEGATIVE 02/14/2017 1942   LEUKOCYTESUR NEGATIVE 02/14/2017 1942   Sepsis Labs: @LABRCNTIP (procalcitonin:4,lacticidven:4)  ) Recent Results (from the past 240 hour(s))  Blood Culture (routine x 2)     Status: None (Preliminary result)   Collection Time: 02/14/17  9:57 PM  Result Value Ref Range Status   Specimen Description BLOOD BLOOD RIGHT FOREARM  Final   Special Requests   Final    BOTTLES DRAWN AEROBIC AND ANAEROBIC Blood Culture adequate volume   Culture   Final    NO GROWTH 4 DAYS Performed at [facility], [address] [place], [place] [address]    Report Status PENDING  Incomplete  Blood Culture (routine x 2)     Status: None (Preliminary result)   Collection Time: 02/14/17  9:57 PM  Result Value Ref Range Status   Specimen Description BLOOD LEFT ANTECUBITAL  Final   Special Requests   Final    BOTTLES DRAWN AEROBIC AND ANAEROBIC Blood Culture adequate volume   Culture   Final    NO GROWTH 4 DAYS Performed at [facility], [address] [place], [place] [address]    Report Status PENDING  Incomplete         Radiology Studies: No results found.      Scheduled Meds: . aspirin EC  81 mg Oral QHS  . atorvastatin  20 mg Oral QHS  . heparin  5,000 Units Subcutaneous Q8H  . insulin aspart  0-15 Units Subcutaneous TID WC  . insulin glargine  25 Units Subcutaneous QHS  . levETIRAcetam  1,000 mg Oral QHS  . nicotine  21 mg Transdermal Daily  . silver sulfADIAZINE  1 application Topical QHS   Continuous Infusions: . piperacillin-tazobactam (ZOSYN)  IV Stopped (02/19/17 1034)  . vancomycin Stopped (02/19/17 1021)     LOS: 5 days    Time spent:    Zannie Cove, MD [facility] Pager [phone]  If  7PM-7AM, please contact night-coverage [email or web address] Password [email or web address] 02/19/2017, 1:17 PM

## 2017-02-19 NOTE — Progress Notes (Signed)
[facility]  [facility] Infusion Coordinator will follow patient's course to support IV ABX at DC if ordered.  If patient discharges after hours, please call [phone].   Sedalia Muta 02/19/2017, 7:05 AM

## 2017-02-19 NOTE — Progress Notes (Signed)
Pharmacy Antibiotic Note  Curtis Bradford is a 56 y.o. male with right foot infection admitted on 02/14/2017 with diabetic foot ulcer with infection.  Pharmacy has been consulted for zosyn and vancomycin dosing.  Plan:  Current plan is to continue current abx as below, then Ortho to reassess on 8/30 to decide if surgery necessary  Vancomycin 1 g IV q12h; goal VT=15-20 mg/L  Zosyn 3.375 g IV given every 8 hrs by 4-hr infusion  Daily Scr  Recheck VT 8/31 AM if Ortho opting for medical management only  f/u MD to receive Cx from [facility] in [place]   Height: 6\' 3"  (190.5 cm) Weight: 194 lb 3.6 oz (88.1 kg) IBW/kg (Calculated) : 84.5  Temp (24hrs), Avg:98.1 F (36.7 C), Min:97.8 F (36.6 C), Max:98.4 F (36.9 C)   Recent Labs Lab 02/14/17 1627 02/14/17 1643 02/14/17 2206 02/15/17 0043 02/15/17 0610 02/16/17 0534 02/16/17 0535 02/16/17 2047 02/17/17 0554 02/18/17 0514 02/19/17 0541  WBC 13.9*  --   --   --  11.6* 9.9  --   --  7.8  --   --   CREATININE 1.44*  --   --   --  1.38*  --  1.56*  --  1.45* 1.37* 1.41*  LATICACIDVEN  --  1.21 1.38 0.8  --   --   --   --   --   --   --   VANCOTROUGH  --   --   --   --   --   --   --  17  --   --   --     Estimated Creatinine Clearance: 71.6 mL/min (A) (by C-G formula based on SCr of 1.41 mg/dL (H)).    No Known Allergies  Antimicrobials this admission: 8/23 rocephin/flagyl>> x1 ED 8/24 zosyn >>  8/23 vancomycin >>  Dose adjustments this admission: 8/25 Vanc trough at 2100 = 17 on 1g q12 - rising SCr so went ahead and checked which will be prior to 5th total dose  Microbiology results: 8/23 BCx2>> ngtd 8/24 HIV>> NR   Thank you for allowing pharmacy to be a part of this patient's care.   Bernadene Person, PharmD, BCPS Pager: [phone] 02/19/2017, 10:37 AM

## 2017-02-20 LAB — BASIC METABOLIC PANEL
ANION GAP: 8 (ref 5–15)
BUN: 18 mg/dL (ref 6–20)
CHLORIDE: 101 mmol/L (ref 101–111)
CO2: 23 mmol/L (ref 22–32)
Calcium: 8.7 mg/dL — ABNORMAL LOW (ref 8.9–10.3)
Creatinine, Ser: 1.56 mg/dL — ABNORMAL HIGH (ref 0.61–1.24)
GFR calc Af Amer: 56 mL/min — ABNORMAL LOW (ref >60)
GFR, EST NON AFRICAN AMERICAN: 49 mL/min — AB (ref >60)
Glucose, Bld: 194 mg/dL — ABNORMAL HIGH (ref 65–99)
POTASSIUM: 4 mmol/L (ref 3.5–5.1)
SODIUM: 132 mmol/L — AB (ref 135–145)

## 2017-02-20 LAB — CBC
HCT: 37.6 % — ABNORMAL LOW (ref 39.0–52.0)
HEMOGLOBIN: 12.9 g/dL — AB (ref 13.0–17.0)
MCH: 30.2 pg (ref 26.0–34.0)
MCHC: 34.3 g/dL (ref 30.0–36.0)
MCV: 88.1 fL (ref 78.0–100.0)
PLATELETS: 339 10*3/uL (ref 150–400)
RBC: 4.27 MIL/uL (ref 4.22–5.81)
RDW: 14.4 % (ref 11.5–15.5)
WBC: 11.8 10*3/uL — ABNORMAL HIGH (ref 4.0–10.5)

## 2017-02-20 LAB — CULTURE, BLOOD (ROUTINE X 2)
CULTURE: NO GROWTH
CULTURE: NO GROWTH
SPECIAL REQUESTS: ADEQUATE
SPECIAL REQUESTS: ADEQUATE

## 2017-02-20 LAB — CREATININE, SERUM
Creatinine, Ser: 1.49 mg/dL — ABNORMAL HIGH (ref 0.61–1.24)
GFR calc non Af Amer: 52 mL/min — ABNORMAL LOW (ref >60)
GFR, EST AFRICAN AMERICAN: 60 mL/min — AB (ref >60)

## 2017-02-20 LAB — GLUCOSE, CAPILLARY
GLUCOSE-CAPILLARY: 210 mg/dL — AB (ref 65–99)
GLUCOSE-CAPILLARY: 171 mg/dL — AB (ref 65–99)
Glucose-Capillary: 179 mg/dL — ABNORMAL HIGH (ref 65–99)
Glucose-Capillary: 135 mg/dL — ABNORMAL HIGH (ref 65–99)

## 2017-02-20 MED ORDER — INSULIN ASPART 100 UNIT/ML ~~LOC~~ SOLN
2.0000 [IU] | Freq: Three times a day (TID) | SUBCUTANEOUS | Status: DC
  Administered 2017-02-20 – 2017-02-21 (×5): 2 [IU] via SUBCUTANEOUS

## 2017-02-20 MED ORDER — PREMIER PROTEIN SHAKE
11.0000 [oz_av] | ORAL | Status: DC
  Administered 2017-02-20 – 2017-02-26 (×4): 11 [oz_av] via ORAL
  Filled 2017-02-20 (×9): qty 325.31

## 2017-02-20 MED ORDER — ENOXAPARIN SODIUM 40 MG/0.4ML ~~LOC~~ SOLN
40.0000 mg | SUBCUTANEOUS | Status: DC
  Administered 2017-02-20 – 2017-02-26 (×5): 40 mg via SUBCUTANEOUS
  Filled 2017-02-20 (×7): qty 0.4

## 2017-02-20 MED ORDER — INSULIN GLARGINE 100 UNIT/ML ~~LOC~~ SOLN
28.0000 [IU] | Freq: Every day | SUBCUTANEOUS | Status: DC
  Administered 2017-02-20: 28 [IU] via SUBCUTANEOUS
  Filled 2017-02-20: qty 0.28

## 2017-02-20 NOTE — Progress Notes (Signed)
PROGRESS NOTE    Kasper Mudrick  ZOX:[id number] DOB: February 13, 1961 DOA: 02/14/2017 PCP: Patient, No Pcp Per  Brief Narrative: Mr. Wieck is a 56 year old male with history of diabetes on insulin, ongoing tobacco abuse, CAD status post PCI/stent, right leg peripheral arterial disease status post PCI and stenting, chronic right foot wound for over 4-6 months now, has been having ongoing care for this at the [facility] in [place], presented to our emergency room 8/23 night with increased pain, fevers and chills. Patient has had a complicated medical history with regards to his right foot, diagnosed with peripheral arterial disease and got a stent to an artery in his right leg in 2016. In February 2018 is when he started having issues with wounds in his right foot during which an angiogram revealed stenosis of prior stent, subsequently underwent PCI -3 new stents were placed in his right leg in February 2018 at [facility] in [place]. After this was also treated with a few weeks of antibiotics via a PICC line under the care of an infectious disease doctor. From May 2018 onwards he was being followed weekly at a wound care center in [facility] in [place], [place] and was getting local wound care, however was noted to have exposed bone subsequently saw an orthopedic surgeon and he underwent excision of the second,  third and fifth metatarsal heads and extensor tenotomies to the second third and fifth toes as well as excisional debridement of plantar ulcer under the second metatarsal head on 01/23/17 by Dr. Kirk Ruths. After this had debridement again on Aug 13 and from Aug first was started on IV Zosyn and Daptomycin via PICC line, and got this for 2 weeks then decided to move to [facility] and PICC was removed by his treating MDs on 8/14. Now on Abx, MRI with Osteo, ID and Dr.Duda following  Assessment & Plan:   Complex Diabetic foot wound with Osteomyelitis -previously  treated for osteomyelitis with long courses of IV antibiotics,  -status post excision of the second second third and fifth metatarsal heads and extensor tenotomies to the second third and fifth toes as well as excisional debridement of plantar ulcer on 01/23/17 -I have requested records from [facility] in [place], and information regarding revascularization and stenting [facility], received some records, awaiting CUlture data -I called and spoke to Dr.Luu at the Wound center 8/27 who was his ID MD as well, she reports that he grew MSSA in one wound Cx and Coag negative staph twice  ( Staph caprae and prevotelia bivia) also in wound culture-but she questioned the value of this since he was on Abx during these times -Continue broad-spectrum IV antibiotics vancomycin/Zosyn Day 6 now -Blood cultures -NGTD -MRI of the right foot concerning for abnormal signal at site of previous surgical site, and osteomyelitis -ABI of right foot with mild decreased flow, s/p Angioplasty and 3 stents to artery in R leg in 07/2016 -Orthopedics consult per Dr.Xu appreciated, seen by Dr.DUda 8/26 night, felt that may need TMT-continue Abx in the interim to eval for response, he will re-assess on Thursday- -ID consult greatly appreciated Awaiting re-evaluation by Dr Lajoyce Corners tomorrow. Patient might want to be treated with IV antibiotics.     Type II diabetes mellitus with renal manifestations (HCC) -Continue Lantus, sliding scale, hemoglobin A1c is 7.0 Increase lantus to 28 units. Will add meals coverage,.      Ongoing tobacco abuse -Counseled   PAD right leg -  Status post PCI and stenting 3 in 07/2016 and previously in 2016 -Continue aspirin, statin -ABIs and Doppler waveforms indicate a mild reduction in arterial flow bilaterally Have requested records plavix has been on hold anticipating sx.    History of CAD -Remote PCI and stent -Continue aspirin, , statin  History of seizures -Last  more than 2 years ago -Continue Keppra    CKD3 -Creatinine at 1.3-1.4 which is probably close to his baseline, monitor with vancomycin use -stable now off IVF  DVT prophylaxis: subcutaneous heparin Code Status:  full code  Family Communication: currently staying in a hotel  Disposition Plan:  Dr.Duda to eval Thursday for TMT  Consultants:   Orthopedics Dr. Roda Shutters    Procedures:  Antimicrobials:   8/23: Vanc/Zosyn    Subjective: Report pain and swelling of foot has improved.  He might wants to try IV antibiotics.    Objective: Vitals:   02/19/17 1318 02/19/17 2137 02/20/17 0513 02/20/17 1435  BP: 129/75 135/88 137/77 109/64  Pulse: 81 76 64 64  Resp: 16 20 18 18   Temp: 98.3 F (36.8 C)  98.1 F (36.7 C) 98.4 F (36.9 C)  TempSrc: Oral  Oral Oral  SpO2: 100% 98% 98% 98%  Weight:      Height:        Intake/Output Summary (Last 24 hours) at 02/20/17 1807 Last data filed at 02/20/17 0957  Gross per 24 hour  Intake             1311 ml  Output                0 ml  Net             1311 ml   Filed Weights   02/14/17 1623 02/15/17 0140 02/16/17 1340  Weight: 86.2 kg (190 lb) 93.4 kg (205 lb 14.6 oz) 88.1 kg (194 lb 3.6 oz)    Examination:  Gen: NAD HEENT: No JVD.  Lungs: CTA CVS: S 1, S 2 RRR Abd: Soft, nt, nd Extremities: : Swollen and deformed right foot, redness and swelling on the dorsum with 2 ulcers-much improved, one ulcer under second metatarsal bone and one at the base of the fifth metatarsal with some slough in the center and dried exudate around it-more dry now. Dorsal surface of the foot with 2 incisions which appear unremarkable, erythema on the dorsum-improving  Redness of foot improving.    Data Reviewed:   CBC:  Recent Labs Lab 02/14/17 1627 02/15/17 0610 02/16/17 0534 02/17/17 0554 02/20/17 0517  WBC 13.9* 11.6* 9.9 7.8 11.8*  NEUTROABS 12.3*  --   --   --   --   HGB 13.8 11.9* 11.1* 11.5* 12.9*  HCT 40.0 35.1* 32.7* 33.4* 37.6*    MCV 87.5 87.3 89.1 87.0 88.1  PLT 276 226 208 227 339   Basic Metabolic Panel:  Recent Labs Lab 02/14/17 1627 02/15/17 0610 02/16/17 0535 02/17/17 0554 02/18/17 0514 02/19/17 0541 02/20/17 0517  NA 134* 134*  --  136  --   --  132*  K 4.2 3.6  --  3.7  --   --  4.0  CL 103 106  --  105  --   --  101  CO2 25 20*  --  25  --   --  23  GLUCOSE 213* 147*  --  128*  --   --  194*  BUN 22* 18  --  19  --   --  18  CREATININE 1.44* 1.38* 1.56* 1.45* 1.37* 1.41* 1.56*  1.49*  CALCIUM 9.0 7.9*  --  8.5*  --   --  8.7*   GFR: Estimated Creatinine Clearance: 67.7 mL/min (A) (by C-G formula based on SCr of 1.49 mg/dL (H)). Liver Function Tests:  Recent Labs Lab 02/14/17 1627 02/15/17 0610  AST 14* 15  ALT 16* 19  ALKPHOS 72 59  BILITOT 0.8 0.7  PROT 7.3 6.0*  ALBUMIN 3.8 3.0*   No results for input(s): LIPASE, AMYLASE in the last 168 hours. No results for input(s): AMMONIA in the last 168 hours. Coagulation Profile:  Recent Labs Lab 02/15/17 0043  INR 1.19   Cardiac Enzymes: No results for input(s): CKTOTAL, CKMB, CKMBINDEX, TROPONINI in the last 168 hours. BNP (last 3 results) No results for input(s): PROBNP in the last 8760 hours. HbA1C: No results for input(s): HGBA1C in the last 72 hours. CBG:  Recent Labs Lab 02/19/17 1632 02/19/17 2140 02/20/17 0726 02/20/17 1203 02/20/17 1641  GLUCAP 189* 243* 179* 210* 135*   Lipid Profile: No results for input(s): CHOL, HDL, LDLCALC, TRIG, CHOLHDL, LDLDIRECT in the last 72 hours. Thyroid Function Tests: No results for input(s): TSH, T4TOTAL, FREET4, T3FREE, THYROIDAB in the last 72 hours. Anemia Panel: No results for input(s): VITAMINB12, FOLATE, FERRITIN, TIBC, IRON, RETICCTPCT in the last 72 hours. Urine analysis:    Component Value Date/Time   COLORURINE YELLOW 02/14/2017 1942   APPEARANCEUR CLEAR 02/14/2017 1942   LABSPEC 1.024 02/14/2017 1942   PHURINE 5.0 02/14/2017 1942   GLUCOSEU 150 (A) 02/14/2017  1942   HGBUR MODERATE (A) 02/14/2017 1942   BILIRUBINUR NEGATIVE 02/14/2017 1942   KETONESUR NEGATIVE 02/14/2017 1942   PROTEINUR 100 (A) 02/14/2017 1942   NITRITE NEGATIVE 02/14/2017 1942   LEUKOCYTESUR NEGATIVE 02/14/2017 1942   Sepsis Labs: @LABRCNTIP (procalcitonin:4,lacticidven:4)  ) Recent Results (from the past 240 hour(s))  Blood Culture (routine x 2)     Status: None   Collection Time: 02/14/17  9:57 PM  Result Value Ref Range Status   Specimen Description BLOOD BLOOD RIGHT FOREARM  Final   Special Requests   Final    BOTTLES DRAWN AEROBIC AND ANAEROBIC Blood Culture adequate volume   Culture   Final    NO GROWTH 5 DAYS Performed at [facility], [address], [place], [place] [address]    Report Status 02/20/2017 FINAL  Final  Blood Culture (routine x 2)     Status: None   Collection Time: 02/14/17  9:57 PM  Result Value Ref Range Status   Specimen Description BLOOD LEFT ANTECUBITAL  Final   Special Requests   Final    BOTTLES DRAWN AEROBIC AND ANAEROBIC Blood Culture adequate volume   Culture   Final    NO GROWTH 5 DAYS Performed at [facility], [address] [place], [place] [address]    Report Status 02/20/2017 FINAL  Final         Radiology Studies: No results found.      Scheduled Meds: . aspirin EC  81 mg Oral QHS  . atorvastatin  20 mg Oral QHS  . enoxaparin (LOVENOX) injection  40 mg Subcutaneous Q24H  . insulin aspart  0-15 Units Subcutaneous TID WC  . insulin aspart  2 Units Subcutaneous TID WC  . insulin glargine  28 Units Subcutaneous QHS  . levETIRAcetam  1,000 mg Oral QHS  . nicotine  21 mg Transdermal Daily  . protein supplement shake  11 oz Oral Q24H  . silver sulfADIAZINE  1 application Topical QHS   Continuous Infusions: . sodium chloride 10 mL/hr at 02/19/17 2305  . piperacillin-tazobactam (ZOSYN)  IV 3.375 g (02/20/17 1449)  . vancomycin Stopped (02/20/17 1134)     LOS: 6 days    Time spent:     Caleigh Rabelo, Md.  [facility] Pager [phone]  If 7PM-7AM, please contact night-coverage [email or web address] Password [facility] 02/20/2017, 6:07 PM

## 2017-02-20 NOTE — Progress Notes (Signed)
Nutrition Follow-up  DOCUMENTATION CODES:   Not applicable  INTERVENTION:   Provide Premier Protein Q24, each supplement provides 160 kcal, 5 g carbohydrate, and 30g protein.   NUTRITION DIAGNOSIS:   Increased nutrient needs related to wound healing as evidenced by estimated needs.  Ongoing  GOAL:   Patient will meet greater than or equal to 90% of their needs  Meeting   MONITOR:   PO intake, Supplement acceptance, Labs, Skin  REASON FOR ASSESSMENT:   Consult Wound healing  ASSESSMENT:   Pt with PMH of HLD, tobacco abuse, uncontrolled DM, CKD III DVT and CAD. Pt reports having right foot ulcers for 4 months PTA with multiple debridements in the past. Presents this admission with infected diabetic foot ulcer with sepsis.   ID notes right toe wound has failed to heal after multiple debridements and IV abx. Orthopedics consulted for possible transmetatarsal amputation 8/31. Will monitor for pt's decision 8/30, when ortho speaks with him again. Pt consuming 90% average of last 8 meals. Wts shown to trend up this hospital stay. Will continue to encourage PO intake and supplements post surgery.   Medications reviewed and include: SSI, NS @ 10 ml/hr, IV abx Labs reviewed: Creatinine 1.49 (H) CBG 170-243  Diet Order:  Diet Carb Modified Fluid consistency: Thin; Room service appropriate? Yes  Skin:   (Open wound right foot)  Last BM:  02/16/17  Height:   Ht Readings from Last 1 Encounters:  02/15/17 6\' 3"  (1.905 m)    Weight:   Wt Readings from Last 1 Encounters:  02/16/17 194 lb 3.6 oz (88.1 kg)    Ideal Body Weight:  89.1 kg  BMI:  Body mass index is 24.28 kg/m.  Estimated Nutritional Needs:   Kcal:  2225-2425 (25-27 kcal/kg IBW)  Protein:  115-125 grams (1.3-1.4 g/kg IBW)  Fluid:  >2.2 L/day  EDUCATION NEEDS:   No education needs identified at this time  Vanessa Kickarly Nykira Reddix RD, LDN Clinical Nutrition Pager # - [phone]

## 2017-02-21 DIAGNOSIS — E1151 Type 2 diabetes mellitus with diabetic peripheral angiopathy without gangrene: Secondary | ICD-10-CM

## 2017-02-21 LAB — CBC
HEMATOCRIT: 38.1 % — AB (ref 39.0–52.0)
Hemoglobin: 13.2 g/dL (ref 13.0–17.0)
MCH: 30.3 pg (ref 26.0–34.0)
MCHC: 34.6 g/dL (ref 30.0–36.0)
MCV: 87.4 fL (ref 78.0–100.0)
Platelets: 368 10*3/uL (ref 150–400)
RBC: 4.36 MIL/uL (ref 4.22–5.81)
RDW: 14.2 % (ref 11.5–15.5)
WBC: 13.6 10*3/uL — AB (ref 4.0–10.5)

## 2017-02-21 LAB — BASIC METABOLIC PANEL
ANION GAP: 8 (ref 5–15)
BUN: 23 mg/dL — AB (ref 6–20)
CALCIUM: 9 mg/dL (ref 8.9–10.3)
CO2: 24 mmol/L (ref 22–32)
CREATININE: 1.72 mg/dL — AB (ref 0.61–1.24)
Chloride: 103 mmol/L (ref 101–111)
GFR calc non Af Amer: 43 mL/min — ABNORMAL LOW (ref >60)
GFR, EST AFRICAN AMERICAN: 50 mL/min — AB (ref >60)
GLUCOSE: 172 mg/dL — AB (ref 65–99)
Potassium: 4.2 mmol/L (ref 3.5–5.1)
SODIUM: 135 mmol/L (ref 135–145)

## 2017-02-21 LAB — GLUCOSE, CAPILLARY
GLUCOSE-CAPILLARY: 203 mg/dL — AB (ref 65–99)
Glucose-Capillary: 175 mg/dL — ABNORMAL HIGH (ref 65–99)
Glucose-Capillary: 147 mg/dL — ABNORMAL HIGH (ref 65–99)
Glucose-Capillary: 218 mg/dL — ABNORMAL HIGH (ref 65–99)

## 2017-02-21 LAB — URINALYSIS, ROUTINE W REFLEX MICROSCOPIC
BILIRUBIN URINE: NEGATIVE
GLUCOSE, UA: 50 mg/dL — AB
HGB URINE DIPSTICK: NEGATIVE
KETONES UR: NEGATIVE mg/dL
Leukocytes, UA: NEGATIVE
NITRITE: NEGATIVE
PH: 5 (ref 5.0–8.0)
Protein, ur: NEGATIVE mg/dL
SPECIFIC GRAVITY, URINE: 1.01 (ref 1.005–1.030)

## 2017-02-21 LAB — VANCOMYCIN, TROUGH: VANCOMYCIN TR: 24 ug/mL — AB (ref 15–20)

## 2017-02-21 MED ORDER — INSULIN GLARGINE 100 UNIT/ML ~~LOC~~ SOLN
30.0000 [IU] | Freq: Every day | SUBCUTANEOUS | Status: DC
  Administered 2017-02-21 – 2017-02-25 (×5): 30 [IU] via SUBCUTANEOUS
  Filled 2017-02-21 (×7): qty 0.3

## 2017-02-21 MED ORDER — INSULIN ASPART 100 UNIT/ML ~~LOC~~ SOLN
3.0000 [IU] | Freq: Three times a day (TID) | SUBCUTANEOUS | Status: DC
  Administered 2017-02-22 – 2017-02-24 (×6): 3 [IU] via SUBCUTANEOUS
  Administered 2017-02-25: 2 [IU] via SUBCUTANEOUS
  Administered 2017-02-25 – 2017-02-26 (×4): 3 [IU] via SUBCUTANEOUS

## 2017-02-21 NOTE — Progress Notes (Signed)
INFECTIOUS DISEASE PROGRESS NOTE  ID: Curtis Bradford is a 56 y.o. male with  Principal Problem:   Subacute osteomyelitis, right ankle and foot (HCC) Active Problems:   Type II diabetes mellitus with renal manifestations (HCC)   HLD (hyperlipidemia)   Tobacco abuse   CKD (chronic kidney disease), stage III   Sepsis (HCC)   Type 2 diabetes mellitus with diabetic foot ulcer (HCC)   Diabetic foot infection (HCC)   CAD (coronary artery disease)   Seizure (HCC)   Peripheral neuropathy   Peripheral artery disease (HCC)  Subjective: Pt unsure about surgery, wants partial amputation.   Abtx:  Anti-infectives    Start     Dose/Rate Route Frequency Ordered Stop   02/15/17 1000  fluconazole (DIFLUCAN) tablet 100 mg  Status:  Discontinued     100 mg Oral Daily 02/14/17 2356 02/18/17 1110   02/15/17 1000  vancomycin (VANCOCIN) IVPB 1000 mg/200 mL premix  Status:  Discontinued     1,000 mg 200 mL/hr over 60 Minutes Intravenous Every 12 hours 02/15/17 0008 02/21/17 1141   02/15/17 0600  piperacillin-tazobactam (ZOSYN) IVPB 3.375 g     3.375 g 12.5 mL/hr over 240 Minutes Intravenous Every 8 hours 02/15/17 0008     02/14/17 2230  vancomycin (VANCOCIN) IVPB 1000 mg/200 mL premix     1,000 mg 200 mL/hr over 60 Minutes Intravenous  Once 02/14/17 2215 02/14/17 2341   02/14/17 2215  cefTRIAXone (ROCEPHIN) 2 g in dextrose 5 % 50 mL IVPB  Status:  Discontinued     2 g 100 mL/hr over 30 Minutes Intravenous Every 24 hours 02/14/17 2204 02/14/17 2341   02/14/17 2215  metroNIDAZOLE (FLAGYL) tablet 500 mg  Status:  Discontinued     500 mg Oral Every 8 hours 02/14/17 2204 02/15/17 0143      Medications:  Scheduled: . aspirin EC  81 mg Oral QHS  . atorvastatin  20 mg Oral QHS  . enoxaparin (LOVENOX) injection  40 mg Subcutaneous Q24H  . insulin aspart  0-15 Units Subcutaneous TID WC  . insulin aspart  2 Units Subcutaneous TID WC  . insulin glargine  30 Units Subcutaneous QHS  . levETIRAcetam   1,000 mg Oral QHS  . nicotine  21 mg Transdermal Daily  . protein supplement shake  11 oz Oral Q24H  . silver sulfADIAZINE  1 application Topical QHS    Objective: Vital signs in last 24 hours: Temp:  [97.5 F (36.4 C)-98.2 F (36.8 C)] 97.5 F (36.4 C) (08/30 1452) Pulse Rate:  [75-78] 78 (08/30 1452) Resp:  [18-20] 20 (08/30 1452) BP: (113-119)/(66-81) 113/69 (08/30 1452) SpO2:  [96 %-98 %] 96 % (08/30 1452)   General appearance: alert, cooperative and no distress Extremities: R foot dressed. non-tender.   Lab Results  Recent Labs  02/20/17 0517 02/21/17 0606  WBC 11.8* 13.6*  HGB 12.9* 13.2  HCT 37.6* 38.1*  NA 132* 135  K 4.0 4.2  CL 101 103  CO2 23 24  BUN 18 23*  CREATININE 1.56*  1.49* 1.72*   Liver Panel No results for input(s): PROT, ALBUMIN, AST, ALT, ALKPHOS, BILITOT, BILIDIR, IBILI in the last 72 hours. Sedimentation Rate No results for input(s): ESRSEDRATE in the last 72 hours. C-Reactive Protein No results for input(s): CRP in the last 72 hours.  Microbiology: Recent Results (from the past 240 hour(s))  Blood Culture (routine x 2)     Status: None   Collection Time: 02/14/17  9:57 PM  Result Value Ref Range Status   Specimen Description BLOOD BLOOD RIGHT FOREARM  Final   Special Requests   Final    BOTTLES DRAWN AEROBIC AND ANAEROBIC Blood Culture adequate volume   Culture   Final    NO GROWTH 5 DAYS Performed at [facility], [address], [place], [place] [address]    Report Status 02/20/2017 FINAL  Final  Blood Culture (routine x 2)     Status: None   Collection Time: 02/14/17  9:57 PM  Result Value Ref Range Status   Specimen Description BLOOD LEFT ANTECUBITAL  Final   Special Requests   Final    BOTTLES DRAWN AEROBIC AND ANAEROBIC Blood Culture adequate volume   Culture   Final    NO GROWTH 5 DAYS Performed at [facility], [address] [place], [place] [address]    Report Status 02/20/2017 FINAL  Final     Studies/Results: No results found.   Assessment/Plan: Diabetic Foot Ulcer CKD3  Possible amputation in AM at [facility]. Pt wishes small toe only.  No change in anbx for now.  BCx are negative  Hopefully get Op Cx DM fairly controlled in hospital.   Total days of antibiotics:7 vanco/zosyn/flucon         Curtis Bradford Infectious Diseases (pager) [phone] [email or web address] 02/21/2017, 3:47 PM  LOS: 7 days

## 2017-02-21 NOTE — Progress Notes (Signed)
PROGRESS NOTE    Curtis Bradford  ZOX:[id number] DOB: 13-Aug-1960 DOA: 02/14/2017 PCP: Patient, No Pcp Per  Brief Narrative: Mr. Vanness is a 56 year old male with history of diabetes on insulin, ongoing tobacco abuse, CAD status post PCI/stent, right leg peripheral arterial disease status post PCI and stenting, chronic right foot wound for over 4-6 months now, has been having ongoing care for this at the [facility] in [place], presented to our emergency room 8/23 night with increased pain, fevers and chills. Patient has had a complicated medical history with regards to his right foot, diagnosed with peripheral arterial disease and got a stent to an artery in his right leg in 2016. In February 2018 is when he started having issues with wounds in his right foot during which an angiogram revealed stenosis of prior stent, subsequently underwent PCI -3 new stents were placed in his right leg in February 2018 at [facility] in [place]. After this was also treated with a few weeks of antibiotics via a PICC line under the care of an infectious disease doctor. From May 2018 onwards he was being followed weekly at a wound care center in [facility] in [place], [place] and was getting local wound care, however was noted to have exposed bone subsequently saw an orthopedic surgeon and he underwent excision of the second,  third and fifth metatarsal heads and extensor tenotomies to the second third and fifth toes as well as excisional debridement of plantar ulcer under the second metatarsal head on 01/23/17 by Dr. Kirk Ruths. After this had debridement again on Aug 13 and from Aug first was started on IV Zosyn and Daptomycin via PICC line, and got this for 2 weeks then decided to move to [facility] and PICC was removed by his treating MDs on 8/14. Now on Abx, MRI with Osteo, ID and Dr.Duda following  Assessment & Plan:   Complex Diabetic foot wound with Osteomyelitis -previously  treated for osteomyelitis with long courses of IV antibiotics,  -status post excision of the second second third and fifth metatarsal heads and extensor tenotomies to the second third and fifth toes as well as excisional debridement of plantar ulcer on 01/23/17 -I have requested records from [facility] in [place], and information regarding revascularization and stenting [facility], received some records, awaiting CUlture data -I called and spoke to Dr.Luu at the Wound center 8/27 who was his ID MD as well, she reports that he grew MSSA in one wound Cx and Coag negative staph twice  ( Staph caprae and prevotelia bivia) also in wound culture-but she questioned the value of this since he was on Abx during these times -Continue broad-spectrum IV antibiotics vancomycin/Zosyn Day 6 now -Blood cultures -NGTD -MRI of the right foot concerning for abnormal signal at site of previous surgical site, and osteomyelitis -ABI of right foot with mild decreased flow, s/p Angioplasty and 3 stents to artery in R leg in 07/2016 -Orthopedics consult per Dr.Xu appreciated, seen by Dr.DUda 8/26 night, felt that may need TMT-continue Abx in the interim to eval for response.  Dr Lajoyce Corners discussed options with patient, patient need to decided if he wants to proceed with Sx.  If patient decide on sx he will need to be transfer to [facility].      Type II diabetes mellitus with renal manifestations (HCC) -Continue Lantus, sliding scale, hemoglobin A1c is 7.0 Increase lantus to 30 units. Increase Meal coverage.      Ongoing  tobacco abuse -Counseled   PAD right leg -Status post PCI and stenting 3 in 07/2016 and previously in 2016 -Continue aspirin, statin -ABIs and Doppler waveforms indicate a mild reduction in arterial flow bilaterally Have requested records plavix has been on hold anticipating sx.    History of CAD -Remote PCI and stent -Continue aspirin, , statin  History of seizures -Last  more than 2 years ago -Continue Keppra   AKI;  CKD3 Start IV fluids.  Could be related to vancomycin, level elevated.  Ask ID regarding discontinuation of vancomycin.   DVT prophylaxis: subcutaneous heparin Code Status:  full code  Family Communication: currently staying in a hotel  Disposition Plan:  Dr.Duda to eval Thursday for TMT  Consultants:   Orthopedics Dr. Roda ShuttersXu    Procedures:  Antimicrobials:   8/23: Vanc/Zosyn    Subjective: He is awaiting for ID and ortho to see him, to discusses regarding plan of care.  He relates edema and pain has improved.    Objective: Vitals:   02/20/17 1435 02/20/17 2221 02/21/17 0556 02/21/17 1452  BP: 109/64 119/66 116/81 113/69  Pulse: 64 75 77 78  Resp: 18 18 18 20   Temp: 98.4 F (36.9 C) 98.2 F (36.8 C) 97.9 F (36.6 C) (!) 97.5 F (36.4 C)  TempSrc: Oral Oral Oral Oral  SpO2: 98% 98% 97% 96%  Weight:      Height:        Intake/Output Summary (Last 24 hours) at 02/21/17 1920 Last data filed at 02/21/17 1613  Gross per 24 hour  Intake              954 ml  Output                0 ml  Net              954 ml   Filed Weights   02/14/17 1623 02/15/17 0140 02/16/17 1340  Weight: 86.2 kg (190 lb) 93.4 kg (205 lb 14.6 oz) 88.1 kg (194 lb 3.6 oz)    Examination:  Gen: NAD HEENT; No JVD Lungs: CTA CVS: S 1, S 2 RRR Abd: Soft, nt, nd Extremities: : Swollen and deformed right foot, redness and swelling on the dorsum with 2 ulcers-much improved, one ulcer under second metatarsal bone and one at the base of the fifth metatarsal with some slough in the center and dried exudate around it-more dry now. Dorsal surface of the foot with 2 incisions which appear unremarkable, erythema on the dorsum- same as yesterday.      Data Reviewed:   CBC:  Recent Labs Lab 02/15/17 0610 02/16/17 0534 02/17/17 0554 02/20/17 0517 02/21/17 0606  WBC 11.6* 9.9 7.8 11.8* 13.6*  HGB 11.9* 11.1* 11.5* 12.9* 13.2  HCT 35.1* 32.7*  33.4* 37.6* 38.1*  MCV 87.3 89.1 87.0 88.1 87.4  PLT 226 208 227 339 368   Basic Metabolic Panel:  Recent Labs Lab 02/15/17 0610  02/17/17 0554 02/18/17 0514 02/19/17 0541 02/20/17 0517 02/21/17 0606  NA 134*  --  136  --   --  132* 135  K 3.6  --  3.7  --   --  4.0 4.2  CL 106  --  105  --   --  101 103  CO2 20*  --  25  --   --  23 24  GLUCOSE 147*  --  128*  --   --  194* 172*  BUN 18  --  19  --   --  18 23*  CREATININE 1.38*  < > 1.45* 1.37* 1.41* 1.56*  1.49* 1.72*  CALCIUM 7.9*  --  8.5*  --   --  8.7* 9.0  < > = values in this interval not displayed. GFR: Estimated Creatinine Clearance: 58.7 mL/min (A) (by C-G formula based on SCr of 1.72 mg/dL (H)). Liver Function Tests:  Recent Labs Lab 02/15/17 0610  AST 15  ALT 19  ALKPHOS 59  BILITOT 0.7  PROT 6.0*  ALBUMIN 3.0*   No results for input(s): LIPASE, AMYLASE in the last 168 hours. No results for input(s): AMMONIA in the last 168 hours. Coagulation Profile:  Recent Labs Lab 02/15/17 0043  INR 1.19   Cardiac Enzymes: No results for input(s): CKTOTAL, CKMB, CKMBINDEX, TROPONINI in the last 168 hours. BNP (last 3 results) No results for input(s): PROBNP in the last 8760 hours. HbA1C: No results for input(s): HGBA1C in the last 72 hours. CBG:  Recent Labs Lab 02/20/17 1641 02/20/17 2113 02/21/17 0727 02/21/17 1148 02/21/17 1649  GLUCAP 135* 171* 175* 147* 218*   Lipid Profile: No results for input(s): CHOL, HDL, LDLCALC, TRIG, CHOLHDL, LDLDIRECT in the last 72 hours. Thyroid Function Tests: No results for input(s): TSH, T4TOTAL, FREET4, T3FREE, THYROIDAB in the last 72 hours. Anemia Panel: No results for input(s): VITAMINB12, FOLATE, FERRITIN, TIBC, IRON, RETICCTPCT in the last 72 hours. Urine analysis:    Component Value Date/Time   COLORURINE STRAW (A) 02/21/2017 1019   APPEARANCEUR CLEAR 02/21/2017 1019   LABSPEC 1.010 02/21/2017 1019   PHURINE 5.0 02/21/2017 1019   GLUCOSEU 50 (A)  02/21/2017 1019   HGBUR NEGATIVE 02/21/2017 1019   BILIRUBINUR NEGATIVE 02/21/2017 1019   KETONESUR NEGATIVE 02/21/2017 1019   PROTEINUR NEGATIVE 02/21/2017 1019   NITRITE NEGATIVE 02/21/2017 1019   LEUKOCYTESUR NEGATIVE 02/21/2017 1019   Sepsis Labs: @LABRCNTIP (procalcitonin:4,lacticidven:4)  ) Recent Results (from the past 240 hour(s))  Blood Culture (routine x 2)     Status: None   Collection Time: 02/14/17  9:57 PM  Result Value Ref Range Status   Specimen Description BLOOD BLOOD RIGHT FOREARM  Final   Special Requests   Final    BOTTLES DRAWN AEROBIC AND ANAEROBIC Blood Culture adequate volume   Culture   Final    NO GROWTH 5 DAYS Performed at [facility], [address], [place], [place] [address]    Report Status 02/20/2017 FINAL  Final  Blood Culture (routine x 2)     Status: None   Collection Time: 02/14/17  9:57 PM  Result Value Ref Range Status   Specimen Description BLOOD LEFT ANTECUBITAL  Final   Special Requests   Final    BOTTLES DRAWN AEROBIC AND ANAEROBIC Blood Culture adequate volume   Culture   Final    NO GROWTH 5 DAYS Performed at [facility], [address], [place], [place] [address]    Report Status 02/20/2017 FINAL  Final         Radiology Studies: No results found.      Scheduled Meds: . aspirin EC  81 mg Oral QHS  . atorvastatin  20 mg Oral QHS  . enoxaparin (LOVENOX) injection  40 mg Subcutaneous Q24H  . insulin aspart  0-15 Units Subcutaneous TID WC  . insulin aspart  2 Units Subcutaneous TID WC  . insulin glargine  30 Units Subcutaneous QHS  . levETIRAcetam  1,000 mg Oral QHS  . nicotine  21 mg Transdermal Daily  . protein supplement  shake  11 oz Oral Q24H  . silver sulfADIAZINE  1 application Topical QHS   Continuous Infusions: . sodium chloride 10 mL/hr at 02/19/17 2305  . piperacillin-tazobactam (ZOSYN)  IV Stopped (02/21/17 1706)     LOS: 7 days    Time spent:    Belkys Regalado, Md.  [facility] Pager [phone]  If 7PM-7AM, please contact night-coverage [email or web address] Password [email or web address] 02/21/2017, 7:20 PM

## 2017-02-21 NOTE — Care Management Note (Signed)
Case Management Note  Patient Details  Name: Curtis Bradford MRN: [id number] Date of Birth: [birth year]  Subjective/Objective:                  Poss transfer to [facility] for amputation of 4th and 5th metatarsals tbd [id number]  Action/Plan: Date:  February 21, 2017 Chart reviewed for concurrent status and case management needs. Will continue to follow patient progress. Discharge Planning: following for needs Expected discharge date: [id number] Marcelle SmilingRhonda Davis, BSN, AgesRN3, [place]CCM   [phone]  Expected Discharge Date:   (unknown)               Expected Discharge Plan:  Acute to Acute Transfer  In-House Referral:     Discharge planning Services  CM Consult  Post Acute Care Choice:    Choice offered to:     DME Arranged:    DME Agency:     HH Arranged:    HH Agency:     Status of Service:  In process, will continue to follow  If discussed at Long Length of Stay Meetings, dates discussed:    Additional Comments:  Golda AcreDavis, Rhonda Lynn, RN 02/21/2017, 9:10 AM

## 2017-02-21 NOTE — Progress Notes (Signed)
Pharmacy Antibiotic Note  Curtis DimesJohn Bradford is a 56 y.o. male with right foot infection admitted on 02/14/2017 with diabetic foot ulcer with infection.  Pharmacy has been consulted for zosyn and vancomycin dosing. Current plan was to continue current abx as below until Ortho reassess on 8/30 to decide if surgery necessary.   Today, 02/21/2017:  New AKI this AM  Stat vanc trough supratherapeutic  WBC back up but afebrile  Plan:  Hold vancomycin, can check VT with AM labs tomorrow if continuing on abx  Continue Zosyn 3.375 g IV given every 8 hrs by 4-hr infusion  Daily Scr   Height: 6\' 3"  (190.5 cm) Weight: 194 lb 3.6 oz (88.1 kg) IBW/kg (Calculated) : 84.5  Temp (24hrs), Avg:98.2 F (36.8 C), Min:97.9 F (36.6 C), Max:98.4 F (36.9 C)   Recent Labs Lab 02/14/17 1643 02/14/17 2206 02/15/17 0043 02/15/17 0610 02/16/17 0534  02/16/17 2047 02/17/17 0554 02/18/17 0514 02/19/17 0541 02/20/17 0517 02/21/17 0606 02/21/17 0923  WBC  --   --   --  11.6* 9.9  --   --  7.8  --   --  11.8* 13.6*  --   CREATININE  --   --   --  1.38*  --   < >  --  1.45* 1.37* 1.41* 1.56*  1.49* 1.72*  --   LATICACIDVEN 1.21 1.38 0.8  --   --   --   --   --   --   --   --   --   --   VANCOTROUGH  --   --   --   --   --   --  17  --   --   --   --   --  24*  < > = values in this interval not displayed.  Estimated Creatinine Clearance: 58.7 mL/min (A) (by C-G formula based on SCr of 1.72 mg/dL (H)).    No Known Allergies  Antimicrobials this admission: 8/23 rocephin/flagyl>> x1 ED 8/24 zosyn >>  8/23 vancomycin >> 8/30  Dose adjustments this admission: 8/25 Vanc trough at 2100 = 17 on 1g q12 - rising SCr so went ahead and checked which will be prior to 5th total dose 8/30 VT = 24 after SCr bump; holding  Microbiology results: 8/23 BCx2>> ngtd 8/24 HIV>> NR   Thank you for allowing pharmacy to be a part of this patient's care.   Bernadene Personrew Chantia Amalfitano, PharmD, BCPS Pager: [phone] 02/21/2017,  11:46 AM

## 2017-02-21 NOTE — Progress Notes (Signed)
CRITICAL VALUE ALERT  Critical Value:  Vanc. Troph 24  Date & Time Notied:  02/21/2017 11:07  Provider Notified: Regalado  Orders Received/Actions taken: Md and pharmacist notified.

## 2017-02-21 NOTE — Progress Notes (Signed)
Patient ID: Curtis DimesJohn Stell, male   DOB: 1961-04-19, 56 y.o.   MRN: [id number] [name] seen in follow-up for osteomyelitis of the second toe and fifth metatarsal. Patient states symptomatically he feels better the cellulitis is improving. Patient has a palpable dorsalis pedis pulse. How long discussion regarding long-term IV antibiotics versus fifth ray amputation versus transmetatarsal amputation. Feel that the transmetatarsal amp rotation is his best option that is the only way to remove the infection from the second toe and the fifth metatarsal. Discussed that with amputation of the fifth ray this would only partially remove the infection. Discussed that without surgery and with antibiotics there is a possibility of recurrence infection and possibility of losing his foot. Patient states he understands all questions were encouraged and answered. Patient will contact me before for p.m. today if he wants to schedule surgery for tomorrow. Would plan for a transmetatarsal amputation at [facility]. Patient would need [facility] if he wishes to proceed with surgery.

## 2017-02-22 LAB — CBC
HCT: 37 % — ABNORMAL LOW (ref 39.0–52.0)
HEMOGLOBIN: 13.1 g/dL (ref 13.0–17.0)
MCH: 30.8 pg (ref 26.0–34.0)
MCHC: 35.4 g/dL (ref 30.0–36.0)
MCV: 86.9 fL (ref 78.0–100.0)
Platelets: 360 10*3/uL (ref 150–400)
RBC: 4.26 MIL/uL (ref 4.22–5.81)
RDW: 14.3 % (ref 11.5–15.5)
WBC: 13.4 10*3/uL — ABNORMAL HIGH (ref 4.0–10.5)

## 2017-02-22 LAB — BASIC METABOLIC PANEL
Anion gap: 7 (ref 5–15)
BUN: 22 mg/dL — AB (ref 6–20)
CHLORIDE: 105 mmol/L (ref 101–111)
CO2: 23 mmol/L (ref 22–32)
CREATININE: 1.45 mg/dL — AB (ref 0.61–1.24)
Calcium: 8.8 mg/dL — ABNORMAL LOW (ref 8.9–10.3)
GFR calc Af Amer: >60 mL/min (ref >60)
GFR calc non Af Amer: 53 mL/min — ABNORMAL LOW (ref >60)
Glucose, Bld: 143 mg/dL — ABNORMAL HIGH (ref 65–99)
Potassium: 4.2 mmol/L (ref 3.5–5.1)
SODIUM: 135 mmol/L (ref 135–145)

## 2017-02-22 LAB — GLUCOSE, CAPILLARY
GLUCOSE-CAPILLARY: 148 mg/dL — AB (ref 65–99)
GLUCOSE-CAPILLARY: 281 mg/dL — AB (ref 65–99)
Glucose-Capillary: 134 mg/dL — ABNORMAL HIGH (ref 65–99)
Glucose-Capillary: 172 mg/dL — ABNORMAL HIGH (ref 65–99)
Glucose-Capillary: 175 mg/dL — ABNORMAL HIGH (ref 65–99)

## 2017-02-22 LAB — VANCOMYCIN, TROUGH: Vancomycin Tr: 13 ug/mL — ABNORMAL LOW (ref 15–20)

## 2017-02-22 MED ORDER — VANCOMYCIN HCL IN DEXTROSE 1-5 GM/200ML-% IV SOLN
1000.0000 mg | Freq: Once | INTRAVENOUS | Status: AC
  Administered 2017-02-22: 1000 mg via INTRAVENOUS
  Filled 2017-02-22: qty 200

## 2017-02-22 MED ORDER — VANCOMYCIN HCL IN DEXTROSE 750-5 MG/150ML-% IV SOLN
750.0000 mg | Freq: Two times a day (BID) | INTRAVENOUS | Status: DC
  Administered 2017-02-23: 750 mg via INTRAVENOUS
  Filled 2017-02-22 (×6): qty 150

## 2017-02-22 MED ORDER — CEFAZOLIN SODIUM-DEXTROSE 2-4 GM/100ML-% IV SOLN
2.0000 g | INTRAVENOUS | Status: DC
  Filled 2017-02-22: qty 100

## 2017-02-22 MED ORDER — POVIDONE-IODINE 10 % EX SWAB
2.0000 "application " | Freq: Once | CUTANEOUS | Status: AC
  Administered 2017-02-23: 2 via TOPICAL

## 2017-02-22 MED ORDER — CHLORHEXIDINE GLUCONATE 4 % EX LIQD
60.0000 mL | Freq: Once | CUTANEOUS | Status: AC
  Administered 2017-02-23: 4 via TOPICAL
  Filled 2017-02-22: qty 60

## 2017-02-22 NOTE — Progress Notes (Signed)
Report called to Jasmine DecemberSharon, Charity fundraiserN at [facility]. [name] is being transferred to 5N room 29C. He voices no complaints at this time and does not appear to be in obvious distress. Ambulated to stretcher and was wheeled down for transfer by [facility].

## 2017-02-22 NOTE — Progress Notes (Signed)
Pharmacy Antibiotic Note  Curtis DimesJohn Bradford is a 56 y.o. male with right foot infection admitted on 02/14/2017 with diabetic foot ulcer with infection.  Pharmacy has been consulted for zosyn and vancomycin dosing. Current plan was to continue current abx as below until Ortho reassess on 8/30 to decide if surgery necessary. Still deciding on plans for surgery as of this AM.  Today, 02/22/2017:  Still deciding on plans for surgery as of this AM.  AKI appears to have been transient, SCr back to baseline this AM  AM vanc trough slightly subtherapeutic  WBC slightly elevated and stable  Afebrile  Plan:  Resume vancomycin 1g IV x 1, then 750 mg IV q12 hr. I suspect 1g q12 hr is the appropriate dose at his baseline renal function, so will need to recheck VT once stable if to continue prolonged course.  Continue Zosyn 3.375 g IV given every 8 hrs by 4-hr infusion  Daily Scr   Height: 6\' 3"  (190.5 cm) Weight: 194 lb 3.6 oz (88.1 kg) IBW/kg (Calculated) : 84.5  Temp (24hrs), Avg:97.9 F (36.6 C), Min:97.5 F (36.4 C), Max:98.1 F (36.7 C)   Recent Labs Lab 02/16/17 0534  02/17/17 0554 02/18/17 0514 02/19/17 0541 02/20/17 0517 02/21/17 0606 02/21/17 0923 02/22/17 0523  WBC 9.9  --  7.8  --   --  11.8* 13.6*  --  13.4*  CREATININE  --   < > 1.45* 1.37* 1.41* 1.56*  1.49* 1.72*  --  1.45*  VANCOTROUGH  --   < >  --   --   --   --   --  24* 13*  < > = values in this interval not displayed.  Estimated Creatinine Clearance: 69.6 mL/min (A) (by C-G formula based on SCr of 1.45 mg/dL (H)).    No Known Allergies  Antimicrobials this admission: 8/23 rocephin/flagyl>> x1 ED 8/24 zosyn >>  8/23 vancomycin >> 8/30, resume 8/31  Dose adjustments this admission: 8/25 Vanc trough at 2100 = 17 on 1g q12 - rising SCr so went ahead and checked which will be prior to 5th total dose 8/30 VT = 24 after SCr bump; holding  Microbiology results: 8/23 BCx2>> ngtd 8/24 HIV>> NR   Thank you for  allowing pharmacy to be a part of this patient's care.   Bernadene Personrew Worthington Cruzan, PharmD, BCPS Pager: [phone] 02/22/2017, 7:53 AM

## 2017-02-22 NOTE — Progress Notes (Signed)
INFECTIOUS DISEASE PROGRESS NOTE  ID: Curtis Bradford is a 56 y.o. male with  Principal Problem:   Subacute osteomyelitis, right ankle and foot (HCC) Active Problems:   Type II diabetes mellitus with renal manifestations (HCC)   HLD (hyperlipidemia)   Tobacco abuse   CKD (chronic kidney disease), stage III   Sepsis (HCC)   Type 2 diabetes mellitus with diabetic foot ulcer (HCC)   Diabetic foot infection (HCC)   CAD (coronary artery disease)   Seizure (HCC)   Peripheral neuropathy   Peripheral artery disease (HCC)  Subjective: Without complaints  Abtx:  Anti-infectives    Start     Dose/Rate Route Frequency Ordered Stop   02/22/17 2200  vancomycin (VANCOCIN) IVPB 750 mg/150 ml premix     750 mg 150 mL/hr over 60 Minutes Intravenous Every 12 hours 02/22/17 0745     02/22/17 0800  vancomycin (VANCOCIN) IVPB 1000 mg/200 mL premix     1,000 mg 200 mL/hr over 60 Minutes Intravenous  Once 02/22/17 0709 02/22/17 0901   02/15/17 1000  fluconazole (DIFLUCAN) tablet 100 mg  Status:  Discontinued     100 mg Oral Daily 02/14/17 2356 02/18/17 1110   02/15/17 1000  vancomycin (VANCOCIN) IVPB 1000 mg/200 mL premix  Status:  Discontinued     1,000 mg 200 mL/hr over 60 Minutes Intravenous Every 12 hours 02/15/17 0008 02/21/17 1141   02/15/17 0600  piperacillin-tazobactam (ZOSYN) IVPB 3.375 g     3.375 g 12.5 mL/hr over 240 Minutes Intravenous Every 8 hours 02/15/17 0008     02/14/17 2230  vancomycin (VANCOCIN) IVPB 1000 mg/200 mL premix     1,000 mg 200 mL/hr over 60 Minutes Intravenous  Once 02/14/17 2215 02/14/17 2341   02/14/17 2215  cefTRIAXone (ROCEPHIN) 2 g in dextrose 5 % 50 mL IVPB  Status:  Discontinued     2 g 100 mL/hr over 30 Minutes Intravenous Every 24 hours 02/14/17 2204 02/14/17 2341   02/14/17 2215  metroNIDAZOLE (FLAGYL) tablet 500 mg  Status:  Discontinued     500 mg Oral Every 8 hours 02/14/17 2204 02/15/17 0143      Medications:  Scheduled: . aspirin EC  81 mg  Oral QHS  . atorvastatin  20 mg Oral QHS  . enoxaparin (LOVENOX) injection  40 mg Subcutaneous Q24H  . insulin aspart  0-15 Units Subcutaneous TID WC  . insulin aspart  3 Units Subcutaneous TID WC  . insulin glargine  30 Units Subcutaneous QHS  . levETIRAcetam  1,000 mg Oral QHS  . nicotine  21 mg Transdermal Daily  . protein supplement shake  11 oz Oral Q24H  . silver sulfADIAZINE  1 application Topical QHS    Objective: Vital signs in last 24 hours: Temp:  [97.4 F (36.3 C)-98.1 F (36.7 C)] 97.4 F (36.3 C) (08/31 1407) Pulse Rate:  [72-79] 79 (08/31 1407) Resp:  [18-20] 20 (08/31 1407) BP: (118-139)/(69-80) 126/69 (08/31 1407) SpO2:  [96 %-99 %] 99 % (08/31 1407)   General appearance: alert, cooperative and no distress Incision/Wound: R foot wrapped.   Lab Results  Recent Labs  02/21/17 0606 02/22/17 0523  WBC 13.6* 13.4*  HGB 13.2 13.1  HCT 38.1* 37.0*  NA 135 135  K 4.2 4.2  CL 103 105  CO2 24 23  BUN 23* 22*  CREATININE 1.72* 1.45*   Liver Panel No results for input(s): PROT, ALBUMIN, AST, ALT, ALKPHOS, BILITOT, BILIDIR, IBILI in the last 72 hours. Sedimentation Rate  No results for input(s): ESRSEDRATE in the last 72 hours. C-Reactive Protein No results for input(s): CRP in the last 72 hours.  Microbiology: Recent Results (from the past 240 hour(s))  Blood Culture (routine x 2)     Status: None   Collection Time: 02/14/17  9:57 PM  Result Value Ref Range Status   Specimen Description BLOOD BLOOD RIGHT FOREARM  Final   Special Requests   Final    BOTTLES DRAWN AEROBIC AND ANAEROBIC Blood Culture adequate volume   Culture   Final    NO GROWTH 5 DAYS Performed at [facility], [address] [place], [place] [address]    Report Status 02/20/2017 FINAL  Final  Blood Culture (routine x 2)     Status: None   Collection Time: 02/14/17  9:57 PM  Result Value Ref Range Status   Specimen Description BLOOD LEFT ANTECUBITAL  Final   Special  Requests   Final    BOTTLES DRAWN AEROBIC AND ANAEROBIC Blood Culture adequate volume   Culture   Final    NO GROWTH 5 DAYS Performed at [facility], [address] [place], [place] [address]    Report Status 02/20/2017 FINAL  Final    Studies/Results: No results found.   Assessment/Plan: Diabetic Foot Ulcer CKD3  Amputation in AM at [facility]- hopefully we can get bone Cx.  No change in anbx for now.  BCx are negative  Cr improved DM fairly controlled in hospital (< 203 last 24h).   Dr Luciana Axe is available over weekend.   Total days of antibiotics:8 vanco/zosyn/flucon         Johny Sax Infectious Diseases (pager) [phone] [email or web address] 02/22/2017, 6:13 PM  LOS: 8 days

## 2017-02-22 NOTE — Progress Notes (Signed)
PROGRESS NOTE    Curtis Bradford  ZOX:[id number] DOB: 01-17-1961 DOA: 02/14/2017 PCP: Patient, No Pcp Per  Brief Narrative: Curtis Bradford is a 56 year old male with history of diabetes on insulin, ongoing tobacco abuse, CAD status post PCI/stent, right leg peripheral arterial disease status post PCI and stenting, chronic right foot wound for over 4-6 months now, has been having ongoing care for this at the [facility] in [place], presented to our emergency room 8/23 night with increased pain, fevers and chills. Patient has had a complicated medical history with regards to his right foot, diagnosed with peripheral arterial disease and got a stent to an artery in his right leg in 2016. In February 2018 is when he started having issues with wounds in his right foot during which an angiogram revealed stenosis of prior stent, subsequently underwent PCI -3 new stents were placed in his right leg in February 2018 at [facility] in [place]. After this was also treated with a few weeks of antibiotics via a PICC line under the care of an infectious disease doctor. From May 2018 onwards he was being followed weekly at a wound care center in [facility] in [place], [place] and was getting local wound care, however was noted to have exposed bone subsequently saw an orthopedic surgeon and he underwent excision of the second,  third and fifth metatarsal heads and extensor tenotomies to the second third and fifth toes as well as excisional debridement of plantar ulcer under the second metatarsal head on 01/23/17 by Dr. Kirk Ruths. After this had debridement again on Aug 13 and from Aug first was started on IV Zosyn and Daptomycin via PICC line, and got this for 2 weeks then decided to move to [facility] and PICC was removed by his treating MDs on 8/14. Now on Abx, MRI with Osteo, ID and Dr.Duda following  Assessment & Plan:   Complex Diabetic foot wound with Osteomyelitis -previously  treated for osteomyelitis with long courses of IV antibiotics,  -status post excision of the second second third and fifth metatarsal heads and extensor tenotomies to the second third and fifth toes as well as excisional debridement of plantar ulcer on 01/23/17 -I have requested records from [facility] in [place], and information regarding revascularization and stenting [facility], received some records, awaiting CUlture data -I called and spoke to Dr.Luu at the Wound center 8/27 who was his ID MD as well, she reports that he grew MSSA in one wound Cx and Coag negative staph twice  ( Staph caprae and prevotelia bivia) also in wound culture-but she questioned the value of this since he was on Abx during these times -Continue broad-spectrum IV antibiotics vancomycin/Zosyn Day 8 -Blood cultures -NGTD -MRI of the right foot concerning for abnormal signal at site of previous surgical site, and osteomyelitis -ABI of right foot with mild decreased flow, s/p Angioplasty and 3 stents to artery in R leg in 07/2016 -Dr.  Lajoyce Corners following. He will discussed options with patient. If patient decide to proceed with surgery he will be transfer to [facility].     Type II diabetes mellitus with renal manifestations (HCC) -Continue Lantus, sliding scale, hemoglobin A1c is 7.0 -Continue with  lantus to 30 units. Increased Meal coverage 8-31     Ongoing tobacco abuse -Counseled   PAD right leg -Status post PCI and stenting 3 in 07/2016 and previously in 2016 -Continue aspirin, statin -ABIs and Doppler waveforms indicate a mild reduction  in arterial flow bilaterally Have requested records plavix has been on hold anticipating sx.    History of CAD -Remote PCI and stent -Continue aspirin, , statin  History of seizures -Last more than 2 years ago -Continue Keppra   AKI;  CKD3 Continue with IV fluids.  Could be related to vancomycin, level elevated.  Cr has decreased. Continue  with IV fluids.   DVT prophylaxis: subcutaneous heparin Code Status:  full code  Family Communication: currently staying in a hotel  Disposition Plan:  Dr.Duda to eval Thursday for TMT  Consultants:   Orthopedics Dr. Roda ShuttersXu    Procedures:  Antimicrobials:   8/23: Vanc/Zosyn    Subjective: He is feeling ok, he would not want transmetatarsal amputation, but would like to discussed with Dr.  Lajoyce Cornersuda options.    Objective: Vitals:   02/21/17 1452 02/21/17 2138 02/22/17 0619 02/22/17 1407  BP: 113/69 139/80 118/77 126/69  Pulse: 78 73 72 79  Resp: 20 20 18 20   Temp: (!) 97.5 F (36.4 C) 98.1 F (36.7 C) 98 F (36.7 C) (!) 97.4 F (36.3 C)  TempSrc: Oral Oral Oral Oral  SpO2: 96% 98% 96% 99%  Weight:      Height:        Intake/Output Summary (Last 24 hours) at 02/22/17 1514 Last data filed at 02/22/17 [id number]  Gross per 24 hour  Intake              484 ml  Output                0 ml  Net              484 ml   Filed Weights   02/14/17 1623 02/15/17 0140 02/16/17 1340  Weight: 86.2 kg (190 lb) 93.4 kg (205 lb 14.6 oz) 88.1 kg (194 lb 3.6 oz)    Examination:  Gen: NAD HEENT; No JVD Lungs: CTA CVS: S 1, S 2 RRR Abd: Soft, nt, nd Extremities: : swollen and deformed foot  dorsum with 2 ulcers-  one ulcer under second metatarsal bone and one at the base of the fifth metatarsal with some slough in the center and dried exudate around it-more dry now. Dorsal surface of the foot with 2 incisions which appear unremarkable, erythema same,      Data Reviewed:   CBC:  Recent Labs Lab 02/16/17 0534 02/17/17 0554 02/20/17 0517 02/21/17 0606 02/22/17 0523  WBC 9.9 7.8 11.8* 13.6* 13.4*  HGB 11.1* 11.5* 12.9* 13.2 13.1  HCT 32.7* 33.4* 37.6* 38.1* 37.0*  MCV 89.1 87.0 88.1 87.4 86.9  PLT 208 227 339 368 360   Basic Metabolic Panel:  Recent Labs Lab 02/17/17 0554 02/18/17 0514 02/19/17 0541 02/20/17 0517 02/21/17 0606 02/22/17 0523  NA 136  --   --  132* 135  135  K 3.7  --   --  4.0 4.2 4.2  CL 105  --   --  101 103 105  CO2 25  --   --  23 24 23   GLUCOSE 128*  --   --  194* 172* 143*  BUN 19  --   --  18 23* 22*  CREATININE 1.45* 1.37* 1.41* 1.56*  1.49* 1.72* 1.45*  CALCIUM 8.5*  --   --  8.7* 9.0 8.8*   GFR: Estimated Creatinine Clearance: 69.6 mL/min (A) (by C-G formula based on SCr of 1.45 mg/dL (H)). Liver Function Tests: No results for input(s): AST, ALT, ALKPHOS, BILITOT, PROT, ALBUMIN  in the last 168 hours. No results for input(s): LIPASE, AMYLASE in the last 168 hours. No results for input(s): AMMONIA in the last 168 hours. Coagulation Profile: No results for input(s): INR, PROTIME in the last 168 hours. Cardiac Enzymes: No results for input(s): CKTOTAL, CKMB, CKMBINDEX, TROPONINI in the last 168 hours. BNP (last 3 results) No results for input(s): PROBNP in the last 8760 hours. HbA1C: No results for input(s): HGBA1C in the last 72 hours. CBG:  Recent Labs Lab 02/21/17 1148 02/21/17 1649 02/21/17 2142 02/22/17 0743 02/22/17 1131  GLUCAP 147* 218* 203* 134* 172*   Lipid Profile: No results for input(s): CHOL, HDL, LDLCALC, TRIG, CHOLHDL, LDLDIRECT in the last 72 hours. Thyroid Function Tests: No results for input(s): TSH, T4TOTAL, FREET4, T3FREE, THYROIDAB in the last 72 hours. Anemia Panel: No results for input(s): VITAMINB12, FOLATE, FERRITIN, TIBC, IRON, RETICCTPCT in the last 72 hours. Urine analysis:    Component Value Date/Time   COLORURINE STRAW (A) 02/21/2017 1019   APPEARANCEUR CLEAR 02/21/2017 1019   LABSPEC 1.010 02/21/2017 1019   PHURINE 5.0 02/21/2017 1019   GLUCOSEU 50 (A) 02/21/2017 1019   HGBUR NEGATIVE 02/21/2017 1019   BILIRUBINUR NEGATIVE 02/21/2017 1019   KETONESUR NEGATIVE 02/21/2017 1019   PROTEINUR NEGATIVE 02/21/2017 1019   NITRITE NEGATIVE 02/21/2017 1019   LEUKOCYTESUR NEGATIVE 02/21/2017 1019   Sepsis Labs: @LABRCNTIP (procalcitonin:4,lacticidven:4)  ) Recent Results (from the  past 240 hour(s))  Blood Culture (routine x 2)     Status: None   Collection Time: 02/14/17  9:57 PM  Result Value Ref Range Status   Specimen Description BLOOD BLOOD RIGHT FOREARM  Final   Special Requests   Final    BOTTLES DRAWN AEROBIC AND ANAEROBIC Blood Culture adequate volume   Culture   Final    NO GROWTH 5 DAYS Performed at [facility], [address] [place], [place] [address]    Report Status 02/20/2017 FINAL  Final  Blood Culture (routine x 2)     Status: None   Collection Time: 02/14/17  9:57 PM  Result Value Ref Range Status   Specimen Description BLOOD LEFT ANTECUBITAL  Final   Special Requests   Final    BOTTLES DRAWN AEROBIC AND ANAEROBIC Blood Culture adequate volume   Culture   Final    NO GROWTH 5 DAYS Performed at [facility], [address], [place], [place] [address]    Report Status 02/20/2017 FINAL  Final         Radiology Studies: No results found.      Scheduled Meds: . aspirin EC  81 mg Oral QHS  . atorvastatin  20 mg Oral QHS  . enoxaparin (LOVENOX) injection  40 mg Subcutaneous Q24H  . insulin aspart  0-15 Units Subcutaneous TID WC  . insulin aspart  3 Units Subcutaneous TID WC  . insulin glargine  30 Units Subcutaneous QHS  . levETIRAcetam  1,000 mg Oral QHS  . nicotine  21 mg Transdermal Daily  . protein supplement shake  11 oz Oral Q24H  . silver sulfADIAZINE  1 application Topical QHS   Continuous Infusions: . sodium chloride 10 mL/hr at 02/19/17 2305  . piperacillin-tazobactam (ZOSYN)  IV 3.375 g (02/22/17 1417)  . vancomycin       LOS: 8 days    Time spent:    Demorris Choyce, Md.  [facility] Pager [phone]  If 7PM-7AM, please contact night-coverage [email or web address] Password [facility] 02/22/2017, 3:14 PM

## 2017-02-23 ENCOUNTER — Inpatient Hospital Stay (HOSPITAL_COMMUNITY): Payer: Managed Care, Other (non HMO) | Admitting: Anesthesiology

## 2017-02-23 ENCOUNTER — Encounter (HOSPITAL_COMMUNITY)
Admission: EM | Disposition: A | Discharge status: Home / Self Care | Payer: Self-pay | Source: Home / Self Care | Attending: Internal Medicine

## 2017-02-23 ENCOUNTER — Encounter (HOSPITAL_COMMUNITY): Payer: Self-pay | Admitting: Anesthesiology

## 2017-02-23 HISTORY — PX: AMPUTATION: SHX166

## 2017-02-23 LAB — GLUCOSE, CAPILLARY
GLUCOSE-CAPILLARY: 134 mg/dL — AB (ref 65–99)
GLUCOSE-CAPILLARY: 128 mg/dL — AB (ref 65–99)
Glucose-Capillary: 103 mg/dL — ABNORMAL HIGH (ref 65–99)
Glucose-Capillary: 109 mg/dL — ABNORMAL HIGH (ref 65–99)
Glucose-Capillary: 173 mg/dL — ABNORMAL HIGH (ref 65–99)

## 2017-02-23 LAB — SURGICAL PCR SCREEN
MRSA, PCR: NEGATIVE
Staphylococcus aureus: NEGATIVE

## 2017-02-23 LAB — ABO/RH: ABO/RH(D): O POS

## 2017-02-23 LAB — TYPE AND SCREEN
ABO/RH(D): O POS
ANTIBODY SCREEN: NEGATIVE

## 2017-02-23 SURGERY — AMPUTATION, FOOT, RAY
Anesthesia: Monitor Anesthesia Care | Site: Foot | Laterality: Right

## 2017-02-23 MED ORDER — METOCLOPRAMIDE HCL 5 MG/ML IJ SOLN: 5.0000 mg | Freq: Three times a day (TID) | INTRAMUSCULAR | Status: DC | PRN

## 2017-02-23 MED ORDER — BISACODYL 10 MG RE SUPP: 10.0000 mg | Freq: Every day | RECTAL | Status: DC | PRN

## 2017-02-23 MED ORDER — PROMETHAZINE HCL 25 MG/ML IJ SOLN: 6.2500 mg | INTRAMUSCULAR | Status: DC | PRN

## 2017-02-23 MED ORDER — SODIUM CHLORIDE 0.9 % IV SOLN
INTRAVENOUS | Status: DC
  Administered 2017-02-23: 13:00:00 via INTRAVENOUS

## 2017-02-23 MED ORDER — MIDAZOLAM HCL 2 MG/2ML IJ SOLN
INTRAMUSCULAR | Status: AC
  Filled 2017-02-23: qty 2

## 2017-02-23 MED ORDER — ONDANSETRON HCL 4 MG/2ML IJ SOLN
INTRAMUSCULAR | Status: AC
  Filled 2017-02-23: qty 2

## 2017-02-23 MED ORDER — METHOCARBAMOL 1000 MG/10ML IJ SOLN
500.0000 mg | Freq: Four times a day (QID) | INTRAVENOUS | Status: DC | PRN
  Filled 2017-02-23: qty 5

## 2017-02-23 MED ORDER — ONDANSETRON HCL 4 MG/2ML IJ SOLN
INTRAMUSCULAR | Status: DC | PRN
  Administered 2017-02-23: 4 mg via INTRAVENOUS

## 2017-02-23 MED ORDER — METOCLOPRAMIDE HCL 5 MG PO TABS: 5.0000 mg | ORAL_TABLET | Freq: Three times a day (TID) | ORAL | Status: DC | PRN

## 2017-02-23 MED ORDER — PHENYLEPHRINE 40 MCG/ML (10ML) SYRINGE FOR IV PUSH (FOR BLOOD PRESSURE SUPPORT)
PREFILLED_SYRINGE | INTRAVENOUS | Status: AC
  Filled 2017-02-23: qty 10

## 2017-02-23 MED ORDER — SODIUM CHLORIDE 0.9 % IV SOLN
INTRAVENOUS | Status: AC
  Administered 2017-02-23: 13:00:00 via INTRAVENOUS

## 2017-02-23 MED ORDER — MIDAZOLAM HCL 5 MG/5ML IJ SOLN
INTRAMUSCULAR | Status: DC | PRN
  Administered 2017-02-23: 2 mg via INTRAVENOUS

## 2017-02-23 MED ORDER — FENTANYL CITRATE (PF) 100 MCG/2ML IJ SOLN: 25.0000 ug | INTRAMUSCULAR | Status: DC | PRN

## 2017-02-23 MED ORDER — OXYCODONE HCL 5 MG PO TABS
5.0000 mg | ORAL_TABLET | ORAL | Status: DC | PRN
  Administered 2017-02-26: 10 mg via ORAL
  Filled 2017-02-23: qty 2

## 2017-02-23 MED ORDER — HYDROMORPHONE HCL 1 MG/ML IJ SOLN: 1.0000 mg | INTRAMUSCULAR | Status: DC | PRN

## 2017-02-23 MED ORDER — PHENYLEPHRINE 40 MCG/ML (10ML) SYRINGE FOR IV PUSH (FOR BLOOD PRESSURE SUPPORT)
PREFILLED_SYRINGE | INTRAVENOUS | Status: DC | PRN
  Administered 2017-02-23 (×2): 80 ug via INTRAVENOUS

## 2017-02-23 MED ORDER — ONDANSETRON HCL 4 MG/2ML IJ SOLN: 4.0000 mg | Freq: Four times a day (QID) | INTRAMUSCULAR | Status: DC | PRN

## 2017-02-23 MED ORDER — BUPIVACAINE-EPINEPHRINE (PF) 0.5% -1:200000 IJ SOLN
INTRAMUSCULAR | Status: DC | PRN
  Administered 2017-02-23: 20 mL via PERINEURAL

## 2017-02-23 MED ORDER — ONDANSETRON HCL 4 MG PO TABS: 4.0000 mg | ORAL_TABLET | Freq: Four times a day (QID) | ORAL | Status: DC | PRN

## 2017-02-23 MED ORDER — PROPOFOL 500 MG/50ML IV EMUL
INTRAVENOUS | Status: DC | PRN
  Administered 2017-02-23: 100 ug/kg/min via INTRAVENOUS

## 2017-02-23 MED ORDER — FENTANYL CITRATE (PF) 250 MCG/5ML IJ SOLN
INTRAMUSCULAR | Status: AC
  Filled 2017-02-23: qty 5

## 2017-02-23 MED ORDER — ACETAMINOPHEN 325 MG PO TABS: 650.0000 mg | ORAL_TABLET | Freq: Four times a day (QID) | ORAL | Status: DC | PRN

## 2017-02-23 MED ORDER — DOCUSATE SODIUM 100 MG PO CAPS
100.0000 mg | ORAL_CAPSULE | Freq: Two times a day (BID) | ORAL | Status: DC
  Administered 2017-02-23 – 2017-02-25 (×5): 100 mg via ORAL
  Filled 2017-02-23 (×8): qty 1

## 2017-02-23 MED ORDER — POLYETHYLENE GLYCOL 3350 17 G PO PACK
17.0000 g | PACK | Freq: Every day | ORAL | Status: DC | PRN
Start: 2017-02-23 — End: 2017-02-26

## 2017-02-23 MED ORDER — ACETAMINOPHEN 650 MG RE SUPP: 650.0000 mg | Freq: Four times a day (QID) | RECTAL | Status: DC | PRN

## 2017-02-23 MED ORDER — METHOCARBAMOL 500 MG PO TABS: 500.0000 mg | ORAL_TABLET | Freq: Four times a day (QID) | ORAL | Status: DC | PRN

## 2017-02-23 MED ORDER — 0.9 % SODIUM CHLORIDE (POUR BTL) OPTIME
TOPICAL | Status: DC | PRN
  Administered 2017-02-23: 1000 mL

## 2017-02-23 MED ORDER — MAGNESIUM CITRATE PO SOLN: 1.0000 | Freq: Once | ORAL | Status: DC | PRN

## 2017-02-23 MED ORDER — LACTATED RINGERS IV SOLN
INTRAVENOUS | Status: DC
  Administered 2017-02-23: 10:00:00 via INTRAVENOUS

## 2017-02-23 SURGICAL SUPPLY — 28 items
BLADE SAW SGTL MED 73X18.5 STR (BLADE) ×1 IMPLANT
BLADE SURG 21 STRL SS (BLADE) ×2 IMPLANT
BNDG COHESIVE 4X5 TAN STRL (GAUZE/BANDAGES/DRESSINGS) ×2 IMPLANT
BNDG GAUZE ELAST 4 BULKY (GAUZE/BANDAGES/DRESSINGS) ×2 IMPLANT
COVER SURGICAL LIGHT HANDLE (MISCELLANEOUS) ×3 IMPLANT
DRAPE U-SHAPE 47X51 STRL (DRAPES) ×3 IMPLANT
DRSG ADAPTIC 3X8 NADH LF (GAUZE/BANDAGES/DRESSINGS) ×2 IMPLANT
DRSG PAD ABDOMINAL 8X10 ST (GAUZE/BANDAGES/DRESSINGS) ×2 IMPLANT
DURAPREP 26ML APPLICATOR (WOUND CARE) ×2 IMPLANT
ELECT REM PT RETURN 9FT ADLT (ELECTROSURGICAL) ×2
ELECTRODE REM PT RTRN 9FT ADLT (ELECTROSURGICAL) ×1 IMPLANT
GAUZE SPONGE 4X4 12PLY STRL (GAUZE/BANDAGES/DRESSINGS) ×2 IMPLANT
GLOVE BIOGEL PI IND STRL 9 (GLOVE) ×1 IMPLANT
GLOVE BIOGEL PI INDICATOR 9 (GLOVE) ×1
GLOVE SURG ORTHO 9.0 STRL STRW (GLOVE) ×2 IMPLANT
GOWN STRL REUS W/ TWL XL LVL3 (GOWN DISPOSABLE) ×2 IMPLANT
GOWN STRL REUS W/TWL XL LVL3 (GOWN DISPOSABLE) ×4
KIT BASIN OR (CUSTOM PROCEDURE TRAY) ×2 IMPLANT
KIT ROOM TURNOVER OR (KITS) ×2 IMPLANT
NS IRRIG 1000ML POUR BTL (IV SOLUTION) ×2 IMPLANT
PACK ORTHO EXTREMITY (CUSTOM PROCEDURE TRAY) ×2 IMPLANT
PAD ABD 8X10 STRL (GAUZE/BANDAGES/DRESSINGS) ×1 IMPLANT
PAD ARMBOARD 7.5X6 YLW CONV (MISCELLANEOUS) ×4 IMPLANT
STOCKINETTE IMPERVIOUS LG (DRAPES) IMPLANT
SUT ETHILON 2 0 PSLX (SUTURE) ×2 IMPLANT
TOWEL OR 17X26 10 PK STRL BLUE (TOWEL DISPOSABLE) ×2 IMPLANT
TUBE CONNECTING 12X1/4 (SUCTIONS) ×2 IMPLANT
YANKAUER SUCT BULB TIP NO VENT (SUCTIONS) ×2 IMPLANT

## 2017-02-23 NOTE — H&P (View-Only) (Signed)
Patient ID: Curtis Bradford, male   DOB: 11/16/1960, 55 y.o.   MRN: [id number] [name] seen in follow-up for osteomyelitis of the second toe and fifth metatarsal. Patient states symptomatically he feels better the cellulitis is improving. Patient has a palpable dorsalis pedis pulse. How long discussion regarding long-term IV antibiotics versus fifth ray amputation versus transmetatarsal amputation. Feel that the transmetatarsal amp rotation is his best option that is the only way to remove the infection from the second toe and the fifth metatarsal. Discussed that with amputation of the fifth ray this would only partially remove the infection. Discussed that without surgery and with antibiotics there is a possibility of recurrence infection and possibility of losing his foot. Patient states he understands all questions were encouraged and answered. Patient will contact me before for p.m. today if he wants to schedule surgery for tomorrow. Would plan for a transmetatarsal amputation at [place]. Patient would need to be transferred to [place] if he wishes to proceed with surgery. 

## 2017-02-23 NOTE — Progress Notes (Signed)
Attempted x1 by M. Deeann DowseLumban Rn IV RN.  Pt refuses for any further attempts. States it hurts too bad and he wants a PICC line.  RN notified of refusal.

## 2017-02-23 NOTE — Anesthesia Procedure Notes (Signed)
Anesthesia Regional Block: Popliteal block   Pre-Anesthetic Checklist: ,, timeout performed, Correct Patient, Correct Site, Correct Laterality, Correct Procedure, Correct Position, site marked, Risks and benefits discussed,  Surgical consent,  Pre-op evaluation,  At surgeon's request and post-op pain management  Laterality: Right  Prep: chloraprep       Needles:  Injection technique: Single-shot  Needle Type: Echogenic Needle     Needle Length: 9cm  Needle Gauge: 21     Additional Needles:   Procedures: ultrasound guided,,,,,,,,  Narrative:  Start time: 02/23/2017 10:01 AM End time: 02/23/2017 10:04 AM Injection made incrementally with aspirations every 5 mL.  Performed by: Personally  Anesthesiologist: Leslye PeerBROCK, THOMAS E  Additional Notes: No pain on injection. No increased resistance to injection. Injection made in 5cc increments. Good needle visualization. Patient tolerated the procedure well.

## 2017-02-23 NOTE — Progress Notes (Signed)
Orthopedic Tech Progress Note Patient Details:  Curtis DimesJohn Bradford [birth year] [id number]  Ortho Devices Type of Ortho Device: Postop shoe/boot Ortho Device/Splint Location: rle Ortho Device/Splint Interventions: Application   Annalaya Wile 02/23/2017, 2:16 PM

## 2017-02-23 NOTE — Anesthesia Postprocedure Evaluation (Signed)
Anesthesia Post Note  Patient: Curtis Bradford  Procedure(s) Performed: Procedure(s) (LRB): RIGHT FOOT 5TH RAY AMPUTATION (Right)     Patient location during evaluation: PACU Anesthesia Type: MAC Level of consciousness: awake and alert Pain management: pain level controlled Vital Signs Assessment: post-procedure vital signs reviewed and stable Respiratory status: spontaneous breathing, nonlabored ventilation and respiratory function stable Cardiovascular status: stable and blood pressure returned to baseline Anesthetic complications: no    Last Vitals:  Vitals:   02/23/17 1125 02/23/17 1130  BP: (!) 157/82   Pulse: 61   Resp: 11   Temp:  36.4 C  SpO2: 100%     Last Pain:  Vitals:   02/23/17 0635  TempSrc: Oral  PainSc:                  Audry Pili

## 2017-02-23 NOTE — Consult Note (Signed)
  Patient states that he would like to proceed with a right fifth ray amputation only. Discussed that he still has infection involving the second toe. Patient states he understands and will only consent for a fifth ray amputation. He understands that the second toe infection could worsen he has the potential risk of loss of foot with further infection. Patient states he understands. 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

## 2017-02-23 NOTE — Anesthesia Procedure Notes (Signed)
Procedure Name: MAC Date/Time: 02/23/2017 10:18 AM Performed by: Rush Farmer E Pre-anesthesia Checklist: Patient identified, Emergency Drugs available, Suction available and Patient being monitored Patient Re-evaluated:Patient Re-evaluated prior to induction Oxygen Delivery Method: Simple face mask Induction Type: IV induction Placement Confirmation: positive ETCO2 Dental Injury: Teeth and Oropharynx as per pre-operative assessment

## 2017-02-23 NOTE — Progress Notes (Signed)
PROGRESS NOTE    Curtis Bradford  WUJ:[id number] DOB: 1960-10-11 DOA: 02/14/2017 PCP: Patient, No Pcp Per    Brief Narrative: Curtis Bradford is a 56 year old male with history of diabetes on insulin, ongoing tobacco abuse, CAD status post PCI/stent, right leg peripheral arterial disease status post PCI and stenting, chronic right foot wound for over 4-6 months now, has been having ongoing care for this at the [facility] in [place], presented to our emergency room 8/23 night with increased pain, fevers and chills.  Patient has had a complicated medical history with regards to his right foot, diagnosed with peripheral arterial disease and got a stent to an artery in his right leg in 2016. In February 2018 is when he started having issues with wounds in his right foot during which an angiogram revealed stenosis of prior stent, subsequently underwent PCI -3 new stents were placed in his right leg in February 2018 at [facility] in [place]. After this was also treated with a few weeks of antibiotics via a PICC line under the care of an infectious disease doctor.  From May 2018 onwards he was being followed weekly at a wound care center in [facility] in [place], [place] and was getting local wound care, however was noted to have exposed bone subsequently saw an orthopedic surgeon and he underwent excision of the second,  third and fifth metatarsal heads and extensor tenotomies to the second third and fifth toes as well as excisional debridement of plantar ulcer under the second metatarsal head on 01/23/17 by Dr. Kirk Bradford.  After this had debridement again on Aug 13 and from Aug first was started on IV Zosyn and Daptomycin via PICC line, and got this for 2 weeks then decided to move to [facility] and PICC was removed by his treating MDs on 8/14. Now on Abx, MRI with Osteo, ID and CurtisDuda was consulted, he was transferred from [facility] to [facility] for surgical  intervention. Going to surgery 02/23/2017. In the past he has grown MSSA and coag-negative staph twice along with    Assessment & Plan:   Complex R. Diabetic foot wound with Osteomyelitis  -previously treated for osteomyelitis with long courses of IV antibiotics,  status post excision of the second second third and fifth metatarsal heads and extensor tenotomies to the second third and fifth toes as well as excisional debridement of plantar ulcer on 01/23/17, ID and Ortho on board, most of the previous [facility] in [place], MRI of the right foot suspicious for osteomyelitis, ABI of the right foot shows mild PAD is status post angioplasty and 3 stents placement to the right leg in February 2018. For now Curtis Bradford planning to operate on 02/23/2017. Antibiotics per after Curtis Bradford/ID. In the past he has grown MSSA from the wounds along with Staph caprae and prevotelia biviua.  Total days of antibiotics: 9 vanco/zosyn/flucon   Type II diabetes mellitus with renal manifestations (HCC)  -Continue Lantus, sliding scale, hemoglobin A1c is 7.0 Continue with  lantus to 30 units. Increased Meal coverage 8-31  CBG (last 3)   Recent Labs  02/22/17 2340 02/23/17 0740 02/23/17 1105  GLUCAP 175* 134* 103*     Ongoing tobacco abuse -Counseled   PAD right leg -Status post PCI and stenting 3 in 07/2016 and previously in 2016, -Continue aspirin, statin, ABIs and Doppler waveforms indicate a mild reduction in arterial flow bilaterally, resume Plavix once okay by surgery.  History of CAD -Remote PCI and stent, Continue aspirin, , statin,  resume Plavix once okay by surgery.  History of seizures -Last more than 2 years ago, Continue Keppra   AKI;  CKD3  Continue with IV fluids. Could be related to vancomycin, level elevated. Cr has decreased. Continue with  Gentle IV fluids.     DVT prophylaxis: subcutaneous Lovenox Code Status:  full code  Family Communication: currently staying in  a hotel  Disposition Plan:  CurtisDuda to eval Thursday for TMT  Consultants:   Orthopedics Curtis Bradford    Procedures:  Antimicrobials:   8/23: Vanc/Zosyn    Subjective:  Patient in bed, appears comfortable, denies any headache, no fever, no chest pain or pressure, no shortness of breath , no abdominal pain. No focal weakness.    Objective: Vitals:   02/23/17 1055 02/23/17 1110 02/23/17 1125 02/23/17 1130  BP: 124/78 123/83 (!) 157/82   Pulse: 72 70 61   Resp:  17 11   Temp: (!) 97.2 F (36.2 C)   97.6 F (36.4 C)  TempSrc:      SpO2: 97% 100% 100%   Weight:      Height:        Intake/Output Summary (Last 24 hours) at 02/23/17 1133 Last data filed at 02/23/17 1100  Gross per 24 hour  Intake           2418.5 ml  Output                5 ml  Net           2413.5 ml   Filed Weights   02/14/17 1623 02/15/17 0140 02/16/17 1340  Weight: 86.2 kg (190 lb) 93.4 kg (205 lb 14.6 oz) 88.1 kg (194 lb 3.6 oz)    Examination:  Gen: NAD HEENT; No JVD Lungs: CTA CVS: S 1, S 2 RRR Abd: Soft, nt, nd Extremities: : swollen and deformed R .foot  dorsum with 2 ulcers-  one ulcer under second metatarsal bone and one at the base of the fifth metatarsal with some slough in the center and dried exudate around it-more dry now. Dorsal surface of the foot with 2 incisions which appear unremarkable, mild erythema        Data Reviewed:   CBC:  Recent Labs Lab 02/17/17 0554 02/20/17 0517 02/21/17 0606 02/22/17 0523  WBC 7.8 11.8* 13.6* 13.4*  HGB 11.5* 12.9* 13.2 13.1  HCT 33.4* 37.6* 38.1* 37.0*  MCV 87.0 88.1 87.4 86.9  PLT 227 339 368 360   Basic Metabolic Panel:  Recent Labs Lab 02/17/17 0554 02/18/17 0514 02/19/17 0541 02/20/17 0517 02/21/17 0606 02/22/17 0523  NA 136  --   --  132* 135 135  K 3.7  --   --  4.0 4.2 4.2  CL 105  --   --  101 103 105  CO2 25  --   --  23 24 23   GLUCOSE 128*  --   --  194* 172* 143*  BUN 19  --   --  18 23* 22*  CREATININE  1.45* 1.37* 1.41* 1.56*  1.49* 1.72* 1.45*  CALCIUM 8.5*  --   --  8.7* 9.0 8.8*   GFR: Estimated Creatinine Clearance: 69.6 mL/min (A) (by C-G formula based on SCr of 1.45 mg/dL (H)). Liver Function Tests: No results for input(s): AST, ALT, ALKPHOS, BILITOT, PROT, ALBUMIN in the last 168 hours. No results for input(s): LIPASE, AMYLASE in the last 168 hours.  No results for input(s): AMMONIA in the last 168 hours. Coagulation Profile: No results for input(s): INR, PROTIME in the last 168 hours. Cardiac Enzymes: No results for input(s): CKTOTAL, CKMB, CKMBINDEX, TROPONINI in the last 168 hours. BNP (last 3 results) No results for input(s): PROBNP in the last 8760 hours. HbA1C: No results for input(s): HGBA1C in the last 72 hours. CBG:  Recent Labs Lab 02/22/17 1720 02/22/17 1941 02/22/17 2340 02/23/17 0740 02/23/17 1105  GLUCAP 148* 281* 175* 134* 103*   Lipid Profile: No results for input(s): CHOL, HDL, LDLCALC, TRIG, CHOLHDL, LDLDIRECT in the last 72 hours. Thyroid Function Tests: No results for input(s): TSH, T4TOTAL, FREET4, T3FREE, THYROIDAB in the last 72 hours. Anemia Panel: No results for input(s): VITAMINB12, FOLATE, FERRITIN, TIBC, IRON, RETICCTPCT in the last 72 hours. Urine analysis:    Component Value Date/Time   COLORURINE STRAW (A) 02/21/2017 1019   APPEARANCEUR CLEAR 02/21/2017 1019   LABSPEC 1.010 02/21/2017 1019   PHURINE 5.0 02/21/2017 1019   GLUCOSEU 50 (A) 02/21/2017 1019   HGBUR NEGATIVE 02/21/2017 1019   BILIRUBINUR NEGATIVE 02/21/2017 1019   KETONESUR NEGATIVE 02/21/2017 1019   PROTEINUR NEGATIVE 02/21/2017 1019   NITRITE NEGATIVE 02/21/2017 1019   LEUKOCYTESUR NEGATIVE 02/21/2017 1019   Sepsis Labs: @LABRCNTIP (procalcitonin:4,lacticidven:4)  ) Recent Results (from the past 240 hour(s))  Blood Culture (routine x 2)     Status: None   Collection Time: 02/14/17  9:57 PM  Result Value Ref Range Status   Specimen Description BLOOD BLOOD  RIGHT FOREARM  Final   Special Requests   Final    BOTTLES DRAWN AEROBIC AND ANAEROBIC Blood Culture adequate volume   Culture   Final    NO GROWTH 5 DAYS Performed at [facility], [address] [place], [place] [address]    Report Status 02/20/2017 FINAL  Final  Blood Culture (routine x 2)     Status: None   Collection Time: 02/14/17  9:57 PM  Result Value Ref Range Status   Specimen Description BLOOD LEFT ANTECUBITAL  Final   Special Requests   Final    BOTTLES DRAWN AEROBIC AND ANAEROBIC Blood Culture adequate volume   Culture   Final    NO GROWTH 5 DAYS Performed at [facility], [address] [place], [place] [address]    Report Status 02/20/2017 FINAL  Final  Surgical pcr screen     Status: None   Collection Time: 02/22/17 11:07 PM  Result Value Ref Range Status   MRSA, PCR NEGATIVE NEGATIVE Final   Staphylococcus aureus NEGATIVE NEGATIVE Final    Comment: (NOTE) The Xpert SA Assay (FDA approved for NASAL specimens in patients 55 years of age and older), is one component of a comprehensive surveillance program. It is not intended to diagnose infection nor to guide or monitor treatment.          Radiology Studies: No results found.  Scheduled Meds: . [MAR Hold] aspirin EC  81 mg Oral QHS  . [MAR Hold] atorvastatin  20 mg Oral QHS  . [MAR Hold] enoxaparin (LOVENOX) injection  40 mg Subcutaneous Q24H  . [MAR Hold] insulin aspart  0-15 Units Subcutaneous TID WC  . [MAR Hold] insulin aspart  3 Units Subcutaneous TID WC  . [MAR Hold] insulin glargine  30 Units Subcutaneous QHS  . [MAR Hold] levETIRAcetam  1,000 mg Oral QHS  . [MAR Hold] nicotine  21 mg Transdermal Daily  . [MAR Hold] protein supplement shake  11 oz Oral Q24H  . [MAR Hold] silver sulfADIAZINE  1 application Topical QHS   Continuous Infusions: . sodium chloride 100 mL/hr at 02/22/17 2224  . lactated ringers 10 mL/hr at 02/23/17 0936  . [MAR Hold] piperacillin-tazobactam (ZOSYN)  IV  3.375 g (02/23/17 1610)  . [MAR Hold] vancomycin Stopped (02/23/17 0133)     LOS: 9 days    Time spent:  Signature  Susa Raring M.D on 02/23/2017 at 11:33 AM  Between 7am to 7pm - Pager - [phone] ( page via [email or web address], text pages only, please mention full 10 digit call back number).  After 7pm go to [email or web address] - password [facility]

## 2017-02-23 NOTE — Anesthesia Preprocedure Evaluation (Addendum)
Anesthesia Evaluation  Patient identified by MRN, date of birth, ID band Patient awake    Reviewed: Allergy & Precautions, NPO status , Patient's Chart, lab work & pertinent test results  Airway Mallampati: II  TM Distance: >3 FB Neck ROM: Full    Dental  (+) Dental Advisory Given, Missing   Pulmonary Current Smoker,    Pulmonary exam normal breath sounds clear to auscultation       Cardiovascular + CAD, + Past MI, + Cardiac Stents, + Peripheral Vascular Disease and + DVT  Normal cardiovascular exam Rhythm:Regular Rate:Normal     Neuro/Psych Seizures -,  negative psych ROS   GI/Hepatic negative GI ROS, Neg liver ROS,   Endo/Other  diabetes, Type 2  Renal/GU CRFRenal disease  negative genitourinary   Musculoskeletal negative musculoskeletal ROS (+)   Abdominal   Peds  Hematology negative hematology ROS (+)   Anesthesia Other Findings   Reproductive/Obstetrics                          Anesthesia Physical Anesthesia Plan  ASA: II  Anesthesia Plan: Regional   Post-op Pain Management:  Regional for Post-op pain   Induction:   PONV Risk Score and Plan: 2 and Ondansetron, Dexamethasone, Treatment may vary due to age or medical condition and Midazolam  Airway Management Planned: Natural Airway and Nasal Cannula  Additional Equipment:   Intra-op Plan:   Post-operative Plan: Extubation in OR  Informed Consent: I have reviewed the patients History and Physical, chart, labs and discussed the procedure including the risks, benefits and alternatives for the proposed anesthesia with the patient or authorized representative who has indicated his/her understanding and acceptance.   Dental advisory given  Plan Discussed with: CRNA  Anesthesia Plan Comments:     Anesthesia Quick Evaluation

## 2017-02-23 NOTE — Interval H&P Note (Signed)
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 After consideration of risks, benefits and other options for treatment, the patient has consented to  Procedure(s): RIGHT FOOT 5TH RAY AMPUTATION (Right) as a surgical intervention .  The patient's history has been reviewed, patient examined, no change in status, stable for surgery.  I have reviewed the patient's chart and labs.  Questions were answered to the patient's satisfaction.     Nadara MustardMarcus V Osaze Hubbert

## 2017-02-23 NOTE — Progress Notes (Signed)
Patient has personal belongings in the room, suit case, cell phone, work laptop.  Patient was informed that there is an option to have his items placed in security since he scheduled for surgery this morning and has no one to pick up his personal items.  Patient said he would think about it.  Patient was informed that [facility] is not responsible for his personal items.  Patient stated he understood this information.

## 2017-02-23 NOTE — Transfer of Care (Signed)
Immediate Anesthesia Transfer of Care Note  Patient: Curtis Bradford  Procedure(s) Performed: Procedure(s): RIGHT FOOT 5TH RAY AMPUTATION (Right)  Patient Location: PACU  Anesthesia Type:MAC combined with regional for post-op pain  Level of Consciousness: awake, alert  and oriented  Airway & Oxygen Therapy: Patient Spontanous Breathing and Patient connected to nasal cannula oxygen  Post-op Assessment: Report given to RN, Post -op Vital signs reviewed and stable and Patient moving all extremities  Post vital signs: Reviewed and stable  Last Vitals:  Vitals:   02/22/17 2342 02/23/17 0635  BP: (!) 145/84 124/65  Pulse: 80 73  Resp:    Temp: 36.8 C 36.4 C  SpO2: 99% 97%    Last Pain:  Vitals:   02/23/17 0635  TempSrc: Oral  PainSc:       Patients Stated Pain Goal: 3 (48/34/75 8307)  Complications: No apparent anesthesia complications

## 2017-02-23 NOTE — Op Note (Signed)
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  SURGEON:  Nadara MustardMarcus V Taye Cato, MD  PHYSICIAN ASSISTANT:None ANESTHESIA:   General  PREOPERATIVE INDICATIONS:  Curtis Bradford is a  56 y.o. 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    The risks benefits and alternatives were discussed with the patient preoperatively including but not limited to the risks of infection, bleeding, nerve injury, cardiopulmonary complications, the need for revision surgery, among others, and the patient was willing to proceed.  OPERATIVE IMPLANTS: None  OPERATIVE FINDINGS: Inflammatory necrotic tissue around the little toe MTP joint region no abscess.  OPERATIVE PROCEDURE: Patient was brought the operating room after undergoing a popliteal block. After adequate levels anesthesia were obtained patient's right lower extremity was prepped using DuraPrep draped into a sterile field a timeout was called. A racquet incision was made around the ulcerative tissue the little toe and the fifth metatarsal. The fifth metatarsal was resected at the base with an oscillating saw beveled plantarly. Electrocautery was used for hemostasis. Local tissue rearrangement was used to close a wound 7 x 3 cm. A sterile dressing was applied patient was taken to the PACU in stable condition.

## 2017-02-24 ENCOUNTER — Encounter (HOSPITAL_COMMUNITY): Payer: Self-pay | Admitting: Orthopedic Surgery

## 2017-02-24 DIAGNOSIS — N182 Chronic kidney disease, stage 2 (mild): Secondary | ICD-10-CM

## 2017-02-24 DIAGNOSIS — E1122 Type 2 diabetes mellitus with diabetic chronic kidney disease: Secondary | ICD-10-CM

## 2017-02-24 DIAGNOSIS — M86279 Subacute osteomyelitis, unspecified ankle and foot: Secondary | ICD-10-CM

## 2017-02-24 DIAGNOSIS — Z720 Tobacco use: Secondary | ICD-10-CM

## 2017-02-24 DIAGNOSIS — I739 Peripheral vascular disease, unspecified: Secondary | ICD-10-CM

## 2017-02-24 DIAGNOSIS — G6289 Other specified polyneuropathies: Secondary | ICD-10-CM

## 2017-02-24 DIAGNOSIS — Z89429 Acquired absence of other toe(s), unspecified side: Secondary | ICD-10-CM

## 2017-02-24 DIAGNOSIS — I2510 Atherosclerotic heart disease of native coronary artery without angina pectoris: Secondary | ICD-10-CM

## 2017-02-24 LAB — BASIC METABOLIC PANEL
Anion gap: 7 (ref 5–15)
BUN: 16 mg/dL (ref 6–20)
CO2: 26 mmol/L (ref 22–32)
Calcium: 9 mg/dL (ref 8.9–10.3)
Chloride: 103 mmol/L (ref 101–111)
Creatinine, Ser: 1.5 mg/dL — ABNORMAL HIGH (ref 0.61–1.24)
GFR calc Af Amer: 59 mL/min — ABNORMAL LOW (ref >60)
GFR, EST NON AFRICAN AMERICAN: 51 mL/min — AB (ref >60)
GLUCOSE: 151 mg/dL — AB (ref 65–99)
POTASSIUM: 4.3 mmol/L (ref 3.5–5.1)
Sodium: 136 mmol/L (ref 135–145)

## 2017-02-24 LAB — CBC
HCT: 36.1 % — ABNORMAL LOW (ref 39.0–52.0)
Hemoglobin: 12.2 g/dL — ABNORMAL LOW (ref 13.0–17.0)
MCH: 29.5 pg (ref 26.0–34.0)
MCHC: 33.8 g/dL (ref 30.0–36.0)
MCV: 87.2 fL (ref 78.0–100.0)
PLATELETS: 417 10*3/uL — AB (ref 150–400)
RBC: 4.14 MIL/uL — AB (ref 4.22–5.81)
RDW: 14.3 % (ref 11.5–15.5)
WBC: 12 10*3/uL — ABNORMAL HIGH (ref 4.0–10.5)

## 2017-02-24 LAB — GLUCOSE, CAPILLARY
GLUCOSE-CAPILLARY: 146 mg/dL — AB (ref 65–99)
GLUCOSE-CAPILLARY: 129 mg/dL — AB (ref 65–99)
Glucose-Capillary: 148 mg/dL — ABNORMAL HIGH (ref 65–99)
Glucose-Capillary: 136 mg/dL — ABNORMAL HIGH (ref 65–99)

## 2017-02-24 MED ORDER — SODIUM CHLORIDE 0.9 % IV SOLN
1250.0000 mg | Freq: Once | INTRAVENOUS | Status: AC
  Administered 2017-02-24: 1250 mg via INTRAVENOUS
  Filled 2017-02-24: qty 1250

## 2017-02-24 MED ORDER — CEFTRIAXONE SODIUM 2 G IJ SOLR
2.0000 g | INTRAMUSCULAR | Status: DC
  Administered 2017-02-25 – 2017-02-26 (×2): 2 g via INTRAVENOUS
  Filled 2017-02-24 (×3): qty 2

## 2017-02-24 MED ORDER — PIPERACILLIN-TAZOBACTAM 3.375 G IVPB
3.3750 g | Freq: Three times a day (TID) | INTRAVENOUS | Status: DC
  Filled 2017-02-24: qty 50

## 2017-02-24 MED ORDER — VANCOMYCIN HCL IN DEXTROSE 1-5 GM/200ML-% IV SOLN
1000.0000 mg | Freq: Two times a day (BID) | INTRAVENOUS | Status: DC
  Administered 2017-02-25 – 2017-02-26 (×4): 1000 mg via INTRAVENOUS
  Filled 2017-02-24 (×5): qty 200

## 2017-02-24 NOTE — Progress Notes (Signed)
Pharmacy Antibiotic Note  Curtis Bradford is a 56 y.o. male with diabetic foot wound with osteo.  Pharmacy has been consulted for vancomyci and zosyn dosing. He briefly lost IV access (last vancomycin dose was 9/1 at ~0030). He is also noted s/p R foot 5th ray amputation. Plans are noted for a prolonged course of antibiotics -SCr= 1.5 (down from 1.72)and CrCl ~ 70  Plan: -Vancomycin 1250mg  IV x1 followed by 1000mg  IV q12h -Continue Zosyn -Will follow renal function and clinical progress   Height: 6\' 3"  (190.5 cm) Weight: 194 lb 3.6 oz (88.1 kg) IBW/kg (Calculated) : 84.5  Temp (24hrs), Avg:98.6 F (37 C), Min:98.1 F (36.7 C), Max:99.1 F (37.3 C)   Recent Labs Lab 02/19/17 0541 02/20/17 0517 02/21/17 0606 02/21/17 0923 02/22/17 0523 02/24/17 0424  WBC  --  11.8* 13.6*  --  13.4* 12.0*  CREATININE 1.41* 1.56*  1.49* 1.72*  --  1.45* 1.50*  VANCOTROUGH  --   --   --  24* 13*  --     Estimated Creatinine Clearance: 67.3 mL/min (A) (by C-G formula based on SCr of 1.5 mg/dL (H)).    No Known Allergies  Antimicrobials this admission: 8/23 rocephin/flagyl>> x1 ED 8/24 zosyn >>  8/23 vancomycin >>  Dose adjustments this admission: 8/25 VT = 17 on 1g q12 - rising SCr  8/30 VT = 24 on 1g q12 after SCr bump; held 8/31 VR = 13, resumed 750 IV q12h  Microbiology results: 8/23 BCx2>> negative 8/24 HIV>> NR  Thank you for allowing pharmacy to be a part of this patient's care.  Harland GermanAndrew Diani Jillson, Pharm D 02/24/2017 4:38 PM

## 2017-02-24 NOTE — Progress Notes (Signed)
PROGRESS NOTE  Curtis Bradford  ZOX:[id number] DOB: 1960-07-19 DOA: 02/14/2017 PCP: Patient, No Pcp Per   Brief Narrative: Curtis Bradford is a 56 y.o. male with a history of IDDM, tobacco use, stage II CKD, CAD s/p PCI, PAD s/p stenting and chronic right foot infection who presented to the ED 8/23 for increased pain at the foot associated with fever and chills. Evaluation with MRI showed osteomyelitis of 2nd and 5th metatarsals for which IV antibiotics were started per ID recommendations. Orthopedics, Dr. Lajoyce Corners, recommended transmetatarsal amputation, though the patient only consented to right 5th ray amputation which was performed 9/1.  He had been getting care at [facility] in [place], [place] prior to job-related transfer to [place], [place]. He first noted persistent right foot wound/infection in Feb 2018 for which angiography was performed showing stenosis of stent that was placed 2016, subsequently undergoing repeat stenting at [facility] in [place], [place]. He was then on antibiotics per infectious disease provider through PICC line.  From May 2018 onwards he was being followed weekly at a wound care center in [facility] in [place], [place]. He subsequently underwent amputation of right 2nd - 5th metatarsal heads and excisional debridement of plantar ulcer under the second metatarsal head on 01/23/17 by Dr. Kirk Bradford. Daptomycin and zosyn were started thru PICC. Debridement was repeated 8/13, and PICC was discontinued 8/14 by his PCP due to his moving to [place] (which was against medical advice) with no follow up arranged.   Assessment & Plan: Infected right diabetic foot wound with osteomyelitis: Failing prolonged antibiotics and limited 2nd-5th toe amputations and repeat debridement in early Aug 2018, and now s/p 5th ray amputation 9/1. Still treating 2nd metatarsal osteomyelitis with antibiotics alone as pt declined recommendation for transmetatarsal amputation.  Complicated by PAD as below. In the past he has grown MSSA from the wounds along with Staph caprae and prevotelia biviua. - Continue IV antibiotics, will need prolonged course for presumed new osteomyelitis, so will get PICC placed. Abx per ID. - Monitor for worsening/need for more extensive amputation.   IDT2DM: HbA1c 7%. At inpatient goal.  - Continue lantus 30u (down from 27u home dose) + 3u mealtime + mod SSI qAC. - Holding tradjenta  Tobacco abuse  - Brief cessation counseling provided.  - Nicotine patch   PAD: s/p right leg angioplasty 2016 and stenting 3 in Feb 2018. ABIs demonstrate bilateral reduction in arterial blood flow.  - Continue ASA, statin.  - Holding plavix postop pending orthopedics recommendations   CAD: s/p remote PCI.  - Continue ASA, statin  Seizure disorder: Chronic, stable. No activity >2 yrs.  - Continue keppra  AKI and stage II CKD: Has been followed by nephrologist in the past. Has modest reduction in GFR, improved from admission.  - Monitor SCr closely, especially with vancomycin. - Even with only stage II CKD, after discussions with patient Re: hypothetical eventual need for HD and implications PICC lines have for that, he consents to and prefers PICC placement.   DVT prophylaxis: Lovenox  Code Status: Full Family Communication: None, per pt Disposition Plan: Anticipate DC to extended stay hotel vs. SNF for prolonged abx.   Consultants:   Orthopedics, Dr. Lajoyce Corners  ID  Procedures: Right 5th toe ray amputation by Dr. Lajoyce Corners 9/1.   Antimicrobials:   Vanc/Zosyn 8/23 >>  Subjective: Pain is controlled, had several painful and unsuccessful IV placement attempts last night. No fevers, chills, dyspnea, chest pain.   Objective: BP 121/88 (  BP Location: Left Arm)   Pulse 95   Temp 98.1 F (36.7 C) (Oral)   Resp 16   Ht 6\' 3"  (1.905 m)   Wt 88.1 kg (194 lb 3.6 oz)   SpO2 100%   BMI 24.28 kg/m   Gen: 56yo male in no distress Pulm: Clear and  nonlabored on room air  CV: RRR, no murmur, no JVD GI: Soft, NT, ND, +BS  Neuro: Alert and oriented. No focal deficits. Ext: RLE with bulk dressing c/d/i over foot/ankle. Toes with intact cap refill, intact sensation and motor function. No swelling or proximal erythema.  Data Reviewed:  CBC:  Recent Labs Lab 02/20/17 0517 02/21/17 0606 02/22/17 0523 02/24/17 0424  WBC 11.8* 13.6* 13.4* 12.0*  HGB 12.9* 13.2 13.1 12.2*  HCT 37.6* 38.1* 37.0* 36.1*  MCV 88.1 87.4 86.9 87.2  PLT 339 368 360 417*   Basic Metabolic Panel:  Recent Labs Lab 02/19/17 0541 02/20/17 0517 02/21/17 0606 02/22/17 0523 02/24/17 0424  NA  --  132* 135 135 136  K  --  4.0 4.2 4.2 4.3  CL  --  101 103 105 103  CO2  --  23 24 23 26   GLUCOSE  --  194* 172* 143* 151*  BUN  --  18 23* 22* 16  CREATININE 1.41* 1.56*  1.49* 1.72* 1.45* 1.50*  CALCIUM  --  8.7* 9.0 8.8* 9.0   GFR: Estimated Creatinine Clearance: 67.3 mL/min (A) (by C-G formula based on SCr of 1.5 mg/dL (H)).  CBG:  Recent Labs Lab 02/23/17 1148 02/23/17 1635 02/23/17 2016 02/24/17 0656 02/24/17 1134  GLUCAP 109* 128* 173* 148* 146*   Urine analysis:    Component Value Date/Time   COLORURINE STRAW (A) 02/21/2017 1019   APPEARANCEUR CLEAR 02/21/2017 1019   LABSPEC 1.010 02/21/2017 1019   PHURINE 5.0 02/21/2017 1019   GLUCOSEU 50 (A) 02/21/2017 1019   HGBUR NEGATIVE 02/21/2017 1019   BILIRUBINUR NEGATIVE 02/21/2017 1019   KETONESUR NEGATIVE 02/21/2017 1019   PROTEINUR NEGATIVE 02/21/2017 1019   NITRITE NEGATIVE 02/21/2017 1019   LEUKOCYTESUR NEGATIVE 02/21/2017 1019   Recent Results (from the past 240 hour(s))  Blood Culture (routine x 2)     Status: None   Collection Time: 02/14/17  9:57 PM  Result Value Ref Range Status   Specimen Description BLOOD BLOOD RIGHT FOREARM  Final   Special Requests   Final    BOTTLES DRAWN AEROBIC AND ANAEROBIC Blood Culture adequate volume   Culture   Final    NO GROWTH 5  DAYS Performed at [facility], [address] [place], [place] [address]    Report Status 02/20/2017 FINAL  Final  Blood Culture (routine x 2)     Status: None   Collection Time: 02/14/17  9:57 PM  Result Value Ref Range Status   Specimen Description BLOOD LEFT ANTECUBITAL  Final   Special Requests   Final    BOTTLES DRAWN AEROBIC AND ANAEROBIC Blood Culture adequate volume   Culture   Final    NO GROWTH 5 DAYS Performed at [facility], [address] [place], [place] [address]    Report Status 02/20/2017 FINAL  Final  Surgical pcr screen     Status: None   Collection Time: 02/22/17 11:07 PM  Result Value Ref Range Status   MRSA, PCR NEGATIVE NEGATIVE Final   Staphylococcus aureus NEGATIVE NEGATIVE Final    Comment: (NOTE) The Xpert SA Assay (FDA approved for  NASAL specimens in patients 55 years of age and older), is one component of a comprehensive surveillance program. It is not intended to diagnose infection nor to guide or monitor treatment.       LOS: 10 days   Time spent: 25min  Hazeline Junkeryan Zeda Gangwer, MD Pager [phone]  02/24/2017 at 1:35 PM

## 2017-02-24 NOTE — Evaluation (Signed)
Physical Therapy Evaluation Patient Details Name: Curtis Bradford MRN: [id number] DOB: 1961-01-22 Today's Date: 02/24/2017   History of Present Illness 56 yo male with onset of osteomyelitis on R foot resulting in amputation of R foot 5th ray.  Pt has recently moved to [place] from [place], now in transition.  PMHx:  DM, tobacco abuse, CAD, PCI/stent, RLE PAD, chronic foot wounds, CAD, seizures, AKI, CKD 3   Clinical Impression  Pt is getting up with PT to walk and noted his struggle to keep off anterior portion of R foot with fatigue.  Pt is stating he is comfortable with the effort but did not see him demonstrate awareness of the R foot NWB instructions at all times.  Will continue on with rehab interventions for acute therapy and progress to gait and transfers with lesser devices when appropriate.    Follow Up Recommendations SNF    Equipment Recommendations  Rolling walker with 5" wheels    Recommendations for Other Services       Precautions / Restrictions Precautions Precautions: Fall Required Braces or Orthoses: Other Brace/Splint Other Brace/Splint: R ortho shoe with all OOB activities Restrictions Weight Bearing Restrictions: Yes RLE Weight Bearing: Non weight bearing      Mobility  Bed Mobility Overal bed mobility: Needs Assistance Bed Mobility: Supine to Sit     Supine to sit: Min assist     General bed mobility comments: mainly assisted to lift trunk on bed  Transfers Overall transfer level: Needs assistance Equipment used: Rolling walker (2 wheeled);1 person hand held assist Transfers: Sit to/from Stand Sit to Stand: Min assist;From elevated surface         General transfer comment: reminders about hand placement and safety, NWB on RLE  Ambulation/Gait Ambulation/Gait assistance: Min assist Ambulation Distance (Feet): 30 Feet Assistive device: Rolling walker (2 wheeled);1 person hand held assist Gait Pattern/deviations: Trunk flexed;Antalgic Gait velocity:  reduced Gait velocity interpretation: Below normal speed for age/gender General Gait Details: Pt used walker to swing through on RLE but eventually hasd to sit pt down due to nearly wb on R foot toes  Stairs            Wheelchair Mobility    Modified Rankin (Stroke Patients Only)       Balance Overall balance assessment: Needs assistance Sitting-balance support: Bilateral upper extremity supported;Single extremity supported Sitting balance-Leahy Scale: Good     Standing balance support: Bilateral upper extremity supported;During functional activity Standing balance-Leahy Scale: Poor                               Pertinent Vitals/Pain Pain Assessment: 0-10 Pain Score: 3  Pain Location: R foot at rest Pain Descriptors / Indicators: Operative site guarding;Aching Pain Intervention(s): Limited activity within patient's tolerance;Monitored during session;Premedicated before session;Repositioned;Ice applied    Home Living Family/patient expects to be discharged to:: Unsure Living Arrangements: Alone               Additional Comments: transitional housing    Prior Function Level of Independence: Independent               Hand Dominance        Extremity/Trunk Assessment   Upper Extremity Assessment Upper Extremity Assessment: Overall WFL for tasks assessed    Lower Extremity Assessment Lower Extremity Assessment: Overall WFL for tasks assessed    Cervical / Trunk Assessment Cervical / Trunk Assessment: Normal  Communication   Communication: No  difficulties  Cognition Arousal/Alertness: Awake/alert Behavior During Therapy: WFL for tasks assessed/performed Overall Cognitive Status: Within Functional Limits for tasks assessed                                        General Comments      Exercises     Assessment/Plan    PT Assessment Patient needs continued PT services  PT Problem List Decreased  strength;Decreased range of motion;Decreased activity tolerance;Decreased balance;Decreased mobility;Decreased coordination;Decreased knowledge of use of DME;Decreased safety awareness;Decreased skin integrity;Pain       PT Treatment Interventions DME instruction;Gait training;Functional mobility training;Therapeutic activities;Therapeutic exercise;Balance training;Neuromuscular re-education;Patient/family education    PT Goals (Current goals can be found in the Care Plan section)  Acute Rehab PT Goals Patient Stated Goal: to get home PT Goal Formulation: With patient Time For Goal Achievement: 03/10/17 Potential to Achieve Goals: Good    Frequency Min 3X/week   Barriers to discharge Decreased caregiver support Pt needs to be assisted for mobility to safely walk    Co-evaluation               AM-PAC PT "6 Clicks" Daily Activity  Outcome Measure Difficulty turning over in bed (including adjusting bedclothes, sheets and blankets)?: A Little Difficulty moving from lying on back to sitting on the side of the bed? : Unable Difficulty sitting down on and standing up from a chair with arms (e.g., wheelchair, bedside commode, etc,.)?: Unable Help needed moving to and from a bed to chair (including a wheelchair)?: A Little Help needed walking in hospital room?: A Little Help needed climbing 3-5 steps with a railing? : A Lot 6 Click Score: 13    End of Session Equipment Utilized During Treatment: Gait belt;Oxygen Activity Tolerance: Patient tolerated treatment well;Patient limited by fatigue;Patient limited by pain Patient left: in chair;with call bell/phone within reach;with chair alarm set;with nursing/sitter in room Nurse Communication: Mobility status PT Visit Diagnosis: Unsteadiness on feet (R26.81);Muscle weakness (generalized) (M62.81);Other abnormalities of gait and mobility (R26.89);Pain Pain - Right/Left: Right Pain - part of body: Ankle and joints of foot    Time:  1401-1426 PT Time Calculation (min) (ACUTE ONLY): 25 min   Charges:   PT Evaluation $PT Eval Moderate Complexity: 1 Mod PT Treatments $Gait Training: 8-22 mins   PT G Codes:   PT G-Codes **NOT FOR INPATIENT CLASS** Functional Assessment Tool Used: AM-PAC 6 Clicks Basic Mobility    Ivar Drape 02/24/2017, 10:58 PM   Samul Dada, PT MS Acute Rehab Dept. Number: [facility] [id number] and [facility] [phone]

## 2017-02-24 NOTE — Progress Notes (Signed)
[facility] for Infectious Disease   Reason for visit: Follow up on osteomyelitis  Interval History: s/p amputation of 5th ray; 2nd toe remains with osteomyelitis; no acute events; no associated rash or diarrhea MRI personally reviewed and abnormal bone marrow noted and I would most be concerned for ongoing osteomyelitis.     Physical Exam: Constitutional:  Vitals:   02/24/17 0523 02/24/17 1534  BP: 133/67 121/88  Pulse: 78 95  Resp: 16 16  Temp: 99.1 F (37.3 C) 98.1 F (36.7 C)  SpO2: 97% 100%   patient appears in NAD Respiratory: Normal respiratory effort; CTA B Cardiovascular: RRR GI: soft, nt, nd MS: right wrapped  Review of Systems: Constitutional: negative for fevers and chills Gastrointestinal: negative for diarrhea  Lab Results  Component Value Date   WBC 12.0 (H) 02/24/2017   HGB 12.2 (L) 02/24/2017   HCT 36.1 (L) 02/24/2017   MCV 87.2 02/24/2017   PLT 417 (H) 02/24/2017    Lab Results  Component Value Date   CREATININE 1.50 (H) 02/24/2017   BUN 16 02/24/2017   NA 136 02/24/2017   K 4.3 02/24/2017   CL 103 02/24/2017   CO2 26 02/24/2017    Lab Results  Component Value Date   ALT 19 02/15/2017   AST 15 02/15/2017   ALKPHOS 59 02/15/2017     Microbiology: Recent Results (from the past 240 hour(s))  Blood Culture (routine x 2)     Status: None   Collection Time: 02/14/17  9:57 PM  Result Value Ref Range Status   Specimen Description BLOOD BLOOD RIGHT FOREARM  Final   Special Requests   Final    BOTTLES DRAWN AEROBIC AND ANAEROBIC Blood Culture adequate volume   Culture   Final    NO GROWTH 5 DAYS Performed at [facility], [address] [place], [place] [address]    Report Status 02/20/2017 FINAL  Final  Blood Culture (routine x 2)     Status: None   Collection Time: 02/14/17  9:57 PM  Result Value Ref Range Status   Specimen Description BLOOD LEFT ANTECUBITAL  Final   Special Requests   Final    BOTTLES DRAWN AEROBIC AND  ANAEROBIC Blood Culture adequate volume   Culture   Final    NO GROWTH 5 DAYS Performed at [facility], [place] [address], [place], [place] [address]    Report Status 02/20/2017 FINAL  Final  Surgical pcr screen     Status: None   Collection Time: 02/22/17 11:07 PM  Result Value Ref Range Status   MRSA, PCR NEGATIVE NEGATIVE Final   Staphylococcus aureus NEGATIVE NEGATIVE Final    Comment: (NOTE) The Xpert SA Assay (FDA approved for NASAL specimens in patients 53 years of age and older), is one component of a comprehensive surveillance program. It is not intended to diagnose infection nor to guide or monitor treatment.     Impression/Plan:  1. Osteomyelitis - only 5th ray amputated, other infected toe (2nd) remains.  No cultures sent.  Will do 6 weeks of IV vancomycin and ceftriaxone through October 3rd Ok to pull the picc line at the end of treatment I will arrange follow up in about 4 weeks I will put in OPAT consult Baseline CRP 12.3 ESR 31  2. Tobacco abuse - needs to quit  3. Medication monitoring - stable creat on vancomycin.  Will need continued monitoring.    I will sign off, call with any  new issues or questions. thanks

## 2017-02-25 ENCOUNTER — Encounter (HOSPITAL_COMMUNITY): Payer: Self-pay | Admitting: *Deleted

## 2017-02-25 DIAGNOSIS — G40909 Epilepsy, unspecified, not intractable, without status epilepticus: Secondary | ICD-10-CM

## 2017-02-25 LAB — CREATININE, SERUM
Creatinine, Ser: 1.53 mg/dL — ABNORMAL HIGH (ref 0.61–1.24)
GFR calc non Af Amer: 50 mL/min — ABNORMAL LOW (ref >60)
GFR, EST AFRICAN AMERICAN: 58 mL/min — AB (ref >60)

## 2017-02-25 LAB — GLUCOSE, CAPILLARY
Glucose-Capillary: 127 mg/dL — ABNORMAL HIGH (ref 65–99)
Glucose-Capillary: 118 mg/dL — ABNORMAL HIGH (ref 65–99)
Glucose-Capillary: 148 mg/dL — ABNORMAL HIGH (ref 65–99)
Glucose-Capillary: 159 mg/dL — ABNORMAL HIGH (ref 65–99)

## 2017-02-25 NOTE — Care Management Note (Signed)
Case Management Note  Patient Details  Name: Curtis Bradford MRN: [id number] Date of Birth: Dec 07, 1960  Subjective/Objective:                    Action/Plan:  Patient moving to [place] from [place] . He is a Production designer, theatre/television/filmmanager at [employer] and was transferred. [name] from [place] and has elderly parents in [place]. He also has brothers who are local and a daughter who works at [employer] .   Patient has been on IV ABX at home before. He is interested in possible SNF if insurance will cover. If Insurance will not cover SNF he will discharge to a hotel ( not sure which one yet). Marisue IvanLiz with SW currently talking with patient.   Patient needs PICC line placed before discharge. If patient discharges to hotel will need to know which one. [facility] following. Expected Discharge Date:   (unknown)               Expected Discharge Plan:     In-House Referral:     Discharge planning Services  CM Consult  Post Acute Care Choice:  Home Health Choice offered to:  Patient  DME Arranged:    DME Agency:     HH Arranged:    HH Agency:     Status of Service:  In process, will continue to follow  If discussed at Long Length of Stay Meetings, dates discussed:    Additional Comments:  Kingsley PlanWile, [name] Marie, RN 02/25/2017, 2:53 PM

## 2017-02-25 NOTE — Progress Notes (Signed)
PROGRESS NOTE  Curtis Bradford  MRN:[id number] DOB: 06/17/1961 DOA: 02/14/2017 PCP: Patient, No Pcp Per   Brief Narrative: Mr. Gainey is a 56 y.o. male with a history of IDDM, tobacco use, stage II CKD, CAD s/p PCI, PAD s/p stenting and chronic right foot infection who presented to the ED 8/23 for increased pain at the foot associated with fever and chills. Evaluation with MRI showed osteomyelitis of 2nd and 5th metatarsals for which IV antibiotics were started per ID recommendations. Orthopedics, Dr. Duda, recommended transmetatarsal amputation, though the patient only consented to right 5th ray amputation which was performed 9/1.  He had been getting care at [facility] in [place], [place] prior to job-related transfer to [place], [place]. He first noted persistent right foot wound/infection in Feb 2018 for which angiography was performed showing stenosis of stent that was placed 2016, subsequently undergoing repeat stenting at [facility] in [place], [place]. He was then on antibiotics per infectious disease provider through PICC line.  From May 2018 onwards he was being followed weekly at a wound care center in [facility] in [place], [place]. He subsequently underwent amputation of right 2nd - 5th metatarsal heads and excisional debridement of plantar ulcer under the second metatarsal head on 01/23/17 by Dr. Charles Richardson. Daptomycin and zosyn were started thru PICC. Debridement was repeated 8/13, and PICC was discontinued 8/14 by his PCP due to his moving to [place] (which was against medical advice) with no follow up arranged.   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 Still treating 2nd metatarsal osteomyelitis with antibiotics alone as pt declined recommendation for  transmetatarsal amputation. Complicated by PAD as below. In the past he has grown MSSA from the wounds along with Staph caprae and prevotelia biviua. - Continue IV antibiotics, will need prolonged course for presumed new osteomyelitis, so will get PICC placed. Abx per ID: 6 weeks vanc/ceftriaxone through 03/27/2017. Pull PICC at end of treatment.  - Follow up with ID in 4 weeks - Baseline CRP 12.3, ESR 31. - Monitor for worsening/need for more extensive amputation.  - Weight bearing per orthopedics.  IDT2DM: HbA1c 7%. At inpatient goal.  - Continue lantus 30u (down from 37u home dose) + 3u mealtime + mod SSI qAC. - Holding tradjenta  Tobacco abuse  - Brief cessation counseling provided.  - Nicotine patch   PAD: s/p right leg angioplasty 2016 and stenting 3 in Feb 2018. ABIs demonstrate bilateral reduction in arterial blood flow.  - Continue ASA, statin.  - Holding plavix postop pending orthopedics recommendations   CAD: s/p remote PCI.  - Continue ASA, statin  Seizure disorder: Chronic, stable. No activity >2 yrs.  - Continue keppra  AKI and stage II CKD: Has been followed by nephrologist in the past. Has modest reduction in GFR, improved from admission.  - Monitor SCr closely, especially with vancomycin.  DVT prophylaxis: Lovenox  Code Status: Full Family Communication: None, per pt Disposition Plan: Anticipate DC to extended stay hotel vs. SNF for prolonged IV abx.   Consultants:   Orthopedics, Dr. Duda  ID, Dr. Comer  Procedures: Right 5th toe ray amputation by Dr. Duda 9/1.   Antimicrobials:   Vanc/Zosyn 8/23 >>  Subjective: No complaints. Pain controlled.  Objective: BP 136/74 (BP Location: Right Arm)   Pulse 73     Temp 98 F (36.7 C) (Oral)   Resp 16   Ht 6' 3" (1.905 m)   Wt 88.1 kg (194 lb 3.6 oz)   SpO2 100%   BMI 24.28 kg/m   Gen: 56yo male in no distress Pulm: Clear and nonlabored on room air  CV: RRR, no murmur, no JVD GI: Soft, NT, ND, +BS    Neuro: Alert and oriented. No focal deficits. Ext: RLE with bulk dressing c/d/i over foot/ankle. Toes with intact cap refill, intact sensation and motor function. No swelling or proximal erythema. Left forearm with IV site c/d/i.   Data Reviewed:  CBC:  Recent Labs Lab 02/20/17 0517 02/21/17 0606 02/22/17 0523 02/24/17 0424  WBC 11.8* 13.6* 13.4* 12.0*  HGB 12.9* 13.2 13.1 12.2*  HCT 37.6* 38.1* 37.0* 36.1*  MCV 88.1 87.4 86.9 87.2  PLT 339 368 360 417*   Basic Metabolic Panel:  Recent Labs Lab 02/20/17 0517 02/21/17 0606 02/22/17 0523 02/24/17 0424 02/25/17 0257  NA 132* 135 135 136  --   K 4.0 4.2 4.2 4.3  --   CL 101 103 105 103  --   CO2 23 24 23 26  --   GLUCOSE 194* 172* 143* 151*  --   BUN 18 23* 22* 16  --   CREATININE 1.56*  1.49* 1.72* 1.45* 1.50* 1.53*  CALCIUM 8.7* 9.0 8.8* 9.0  --    GFR: Estimated Creatinine Clearance: 66 mL/min (A) (by C-G formula based on SCr of 1.53 mg/dL (H)).  CBG:  Recent Labs Lab 02/24/17 1134 02/24/17 1625 02/24/17 2139 02/25/17 0701 02/25/17 1137  GLUCAP 146* 129* 136* 127* 118*   Urine analysis:    Component Value Date/Time   COLORURINE STRAW (A) 02/21/2017 1019   APPEARANCEUR CLEAR 02/21/2017 1019   LABSPEC 1.010 02/21/2017 1019   PHURINE 5.0 02/21/2017 1019   GLUCOSEU 50 (A) 02/21/2017 1019   HGBUR NEGATIVE 02/21/2017 1019   BILIRUBINUR NEGATIVE 02/21/2017 1019   KETONESUR NEGATIVE 02/21/2017 1019   PROTEINUR NEGATIVE 02/21/2017 1019   NITRITE NEGATIVE 02/21/2017 1019   LEUKOCYTESUR NEGATIVE 02/21/2017 1019   Recent Results (from the past 240 hour(s))  Surgical pcr screen     Status: None   Collection Time: 02/22/17 11:07 PM  Result Value Ref Range Status   MRSA, PCR NEGATIVE NEGATIVE Final   Staphylococcus aureus NEGATIVE NEGATIVE Final    Comment: (NOTE) The Xpert SA Assay (FDA approved for NASAL specimens in patients 22 years of age and older), is one component of a comprehensive surveillance  program. It is not intended to diagnose infection nor to guide or monitor treatment.       LOS: 11 days   Time spent: 25min  Ryan Grunz, MD Pager [phone]  02/25/2017 at 3:24 PM  

## 2017-02-25 NOTE — Progress Notes (Signed)
Physical Therapy Treatment Patient Details Name: Curtis Bradford MRN: [id number] DOB: Sep 08, 1960 Today's Date: 02/25/2017    History of Present Illness 56 yo male with onset of osteomyelitis on R foot resulting in amputation of R foot 5th ray.  [place] recently moved [place], now in transition.  PMHx:  DM, tobacco abuse, CAD, PCI/stent, RLE PAD, chronic foot wounds, CAD, seizures, AKI, CKD 3     PT Comments    Pt performed increased gait but remains unsafe and non-compliant with weight bearing orders ( R NWB).  Pt may benefit from an offloading shoe to place weight into his heel to improve technique with mobility.  PTA left message for physician and ultimately it will be MD decision if this boot will be safe for patient use.  Pt unable to advance mobility further today as he is at a risk for falls or injury due to non-compliance and poor safety awareness with his RW.     Follow Up Recommendations  SNF     Equipment Recommendations  Rolling walker with 5" wheels    Recommendations for Other Services       Precautions / Restrictions Precautions Precautions: Fall Required Braces or Orthoses: Other Brace/Splint Other Brace/Splint: R ortho shoe with all OOB activities Restrictions Weight Bearing Restrictions: Yes RLE Weight Bearing: Non weight bearing    Mobility  Bed Mobility Overal bed mobility: Modified Independent Bed Mobility: Supine to Sit     Supine to sit: Modified independent (Device/Increase time)     General bed mobility comments: No assist needed.  pt impulsive during transfer and required cues to slow transition to ensure integrity of IV site is maintained.    Transfers Overall transfer level: Needs assistance Equipment used: Rolling walker (2 wheeled) Transfers: Sit to/from Stand Sit to Stand: Supervision         General transfer comment: Pt required cues pre transfers for non weight bearing.  When patient attempted transfer contact of R foot remains on  floor and patient reports he is not putting weight through his RLE.    Ambulation/Gait Ambulation/Gait assistance: Min assist Ambulation Distance (Feet): 80 Feet Assistive device: Rolling walker (2 wheeled) Gait Pattern/deviations: Trunk flexed;Decreased stride length;Step-to pattern;Antalgic     General Gait Details: Pt remains impulsive and required max VCs throughout for technique with hop to pattern.  Pt unable to follow commands to keep RW still when hopping forward.  Pt weight bearing on R foor with dragging pattern leading with LLE.  Pt remains to report, "I'm not putting any weight on it."  Further gait training deferred as patient is non compliant.     Stairs            Wheelchair Mobility    Modified Rankin (Stroke Patients Only)       Balance Overall balance assessment: Needs assistance   Sitting balance-Leahy Scale: Good       Standing balance-Leahy Scale: Poor                              Cognition Arousal/Alertness: Awake/alert Behavior During Therapy: WFL for tasks assessed/performed Overall Cognitive Status: Impaired/Different from baseline Area of Impairment: Safety/judgement                         Safety/Judgement: Decreased awareness of safety;Decreased awareness of deficits     General Comments: Despite education patient unable to comprehend weight bearing or  the seriouosness of keeping RLE NWB.  Pt educated on the risks and benefits of maintaining weight bearing status but remains non compliant.        Exercises      General Comments        Pertinent Vitals/Pain Pain Assessment: 0-10 Pain Score: 1  Pain Location: R foot at rest Pain Descriptors / Indicators: Operative site guarding;Aching Pain Intervention(s): Monitored during session;Repositioned    Home Living                      Prior Function            PT Goals (current goals can now be found in the care plan section) Acute Rehab PT  Goals Patient Stated Goal: to get home Potential to Achieve Goals: Good Progress towards PT goals: Progressing toward goals    Frequency    Min 3X/week      PT Plan Current plan remains appropriate    Co-evaluation              AM-PAC PT "6 Clicks" Daily Activity  Outcome Measure  Difficulty turning over in bed (including adjusting bedclothes, sheets and blankets)?: None Difficulty moving from lying on back to sitting on the side of the bed? : None Difficulty sitting down on and standing up from a chair with arms (e.g., wheelchair, bedside commode, etc,.)?: None Help needed moving to and from a bed to chair (including a wheelchair)?: A Little Help needed walking in hospital room?: A Little Help needed climbing 3-5 steps with a railing? : A Little 6 Click Score: 21    End of Session Equipment Utilized During Treatment: Gait belt;Oxygen Activity Tolerance: Patient tolerated treatment well;Patient limited by fatigue;Patient limited by pain Patient left: with call bell/phone within reach;with nursing/sitter in room;in bed Nurse Communication: Mobility status PT Visit Diagnosis: Unsteadiness on feet (R26.81);Muscle weakness (generalized) (M62.81);Other abnormalities of gait and mobility (R26.89);Pain Pain - Right/Left: Right Pain - part of body: Ankle and joints of foot     Time: 7829-5621 PT Time Calculation (min) (ACUTE ONLY): 10 min  Charges:  $Gait Training: 8-22 mins                    G Codes:       Joycelyn Rua, PTA pager [phone]    Florestine Avers 02/25/2017, 1:33 PM

## 2017-02-25 NOTE — Progress Notes (Signed)
CSW attempted to assess patient for SNF placement earlier today, and patient was in the shower. CSW contacted by MD, who indicated that patient will hopefully be able to DC home with IV antibiotics; patient not interested in SNF placement at this time.   CSW will sign off at this time. Please consult again if additional social work needs arise.  Curtis NicelyElizabeth Tyesha Bradford, [place] Clinical Social Worker [phone]

## 2017-02-25 NOTE — NC FL2 (Signed)
[place] MEDICAID FL2 LEVEL OF CARE SCREENING TOOL     IDENTIFICATION  Patient Name: Curtis Bradford Birthdate: [birth year] Sex: male Admission Date (Current Location): 02/14/2017  [facility] and [place] Number:  Producer, television/film/video and Address:  [facility], [address], [place], [place] [address]      Provider Number: [id number]  Attending Physician Name and Address:  Tyrone Nine, MD  Relative Name and Phone Number:       Current Level of Care: Hospital Recommended Level of Care: Skilled Nursing Facility Prior Approval Number:    Date Approved/Denied:   PASRR Number:    Discharge Plan: SNF    Current Diagnoses: Patient Active Problem List   Diagnosis Date Noted  . Subacute osteomyelitis, right ankle and foot (HCC)   . Peripheral neuropathy 02/16/2017  . Peripheral artery disease (HCC) 02/16/2017  . Sepsis (HCC) 02/14/2017  . 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 Diabetic foot infection (HCC) 02/14/2017  . Type II diabetes mellitus with renal manifestations (HCC)   . HLD (hyperlipidemia)   . Tobacco abuse   . CKD stage 2 due to type 2 diabetes mellitus (HCC)   . CAD (coronary artery disease)   . Seizure disorder (HCC)     Orientation RESPIRATION BLADDER Height & Weight     Self, Situation, Time, Place  Normal Continent Weight: 194 lb 3.6 oz (88.1 kg) Height:  6\' 3"  (190.5 cm)  BEHAVIORAL SYMPTOMS/MOOD NEUROLOGICAL BOWEL NUTRITION STATUS      Continent Diet (carb modified)  AMBULATORY STATUS COMMUNICATION OF NEEDS Skin   Limited Assist Verbally Surgical wounds (located on foot, ACR wrap)                       Personal Care Assistance Level of Assistance  Bathing, Dressing Bathing Assistance: Limited assistance   Dressing Assistance: Limited assistance     Functional Limitations Info             SPECIAL CARE FACTORS FREQUENCY  PT (By licensed PT), OT (By licensed OT)     PT Frequency:  5/wk OT Frequency: 5/wk            Contractures      Additional Factors Info  Code Status, Allergies, Insulin Sliding Scale Code Status Info: FULL Allergies Info: NKA   Insulin Sliding Scale Info: 7/day       Current Medications (02/25/2017):  This is the current hospital active medication list Current Facility-Administered Medications  Medication Dose Route Frequency Provider Last Rate Last Dose  . 0.9 %  sodium chloride infusion   Intravenous Continuous [name], MD 10 mL/hr at 02/24/17 1648    . acetaminophen (TYLENOL) tablet 650 mg  650 mg Oral Q6H PRN Lorretta Harp, MD   650 mg at 02/16/17 1328   Or  . acetaminophen (TYLENOL) suppository 650 mg  650 mg Rectal Q6H PRN Lorretta Harp, MD      . aspirin EC tablet 81 mg  81 mg Oral QHS Lorretta Harp, MD   81 mg at 02/24/17 2119  . atorvastatin (LIPITOR) tablet 20 mg  20 mg Oral QHS Lorretta Harp, MD   20 mg at 02/24/17 2116  . bisacodyl (DULCOLAX) suppository 10 mg  10 mg Rectal Daily PRN [name], MD      . cefTRIAXone (ROCEPHIN) 2 g in dextrose 5 % 50 mL IVPB  2 g Intravenous Q24H Staci Righter  W, MD      . docusate sodium (COLACE) capsule 100 mg  100 mg Oral BID Nadara Mustarduda, Marcus V, MD   100 mg at 02/25/17 [id number]  . enoxaparin (LOVENOX) injection 40 mg  40 mg Subcutaneous Q24H Regalado, Belkys A, MD   40 mg at 02/25/17 0851  . HYDROcodone-acetaminophen (NORCO/VICODIN) 5-325 MG per tablet 1 tablet  1 tablet Oral TID PRN Lorretta HarpNiu, Xilin, MD   1 tablet at 02/25/17 0015  . HYDROmorphone (DILAUDID) injection 1 mg  1 mg Intravenous Q2H PRN Nadara Mustarduda, Marcus V, MD      . insulin aspart (novoLOG) injection 0-15 Units  0-15 Units Subcutaneous TID [facility] Zannie CoveJoseph, Preetha, MD   2 Units at 02/25/17 0747  . insulin aspart (novoLOG) injection 3 Units  3 Units Subcutaneous TID WC Regalado, Belkys A, MD   3 Units at 02/25/17 1215  . insulin glargine (LANTUS) injection 30 Units  30 Units Subcutaneous QHS Regalado, Belkys A, MD   30 Units at 02/24/17 2210  . lactated  ringers infusion   Intravenous Continuous Beryle LatheBrock, Thomas E, MD 10 mL/hr at 02/23/17 [phone]    . levETIRAcetam (KEPPRA) tablet 1,000 mg  1,000 mg Oral QHS Lorretta HarpNiu, Xilin, MD   1,000 mg at 02/24/17 2118  . magnesium citrate solution 1 Bottle  1 Bottle Oral Once PRN Nadara Mustarduda, Marcus V, MD      . methocarbamol (ROBAXIN) tablet 500 mg  500 mg Oral Q6H PRN Nadara Mustarduda, Marcus V, MD       Or  . methocarbamol (ROBAXIN) 500 mg in dextrose 5 % 50 mL IVPB  500 mg Intravenous Q6H PRN Nadara Mustarduda, Marcus V, MD      . metoCLOPramide (REGLAN) tablet 5-10 mg  5-10 mg Oral Q8H PRN Nadara Mustarduda, Marcus V, MD       Or  . metoCLOPramide (REGLAN) injection 5-10 mg  5-10 mg Intravenous Q8H PRN Nadara Mustarduda, Marcus V, MD      . nicotine (NICODERM CQ - dosed in mg/24 hours) patch 21 mg  21 mg Transdermal Daily Lorretta HarpNiu, Xilin, MD      . ondansetron [facility](ZOFRAN) tablet 4 mg  4 mg Oral Q6H PRN Nadara Mustarduda, Marcus V, MD       Or  . ondansetron [facility](ZOFRAN) injection 4 mg  4 mg Intravenous Q6H PRN Nadara Mustarduda, Marcus V, MD      . oxyCODONE (Oxy IR/ROXICODONE) immediate release tablet 5-10 mg  5-10 mg Oral Q3H PRN Nadara Mustarduda, Marcus V, MD      . polyethylene glycol (MIRALAX / GLYCOLAX) packet 17 g  17 g Oral Daily PRN Nadara Mustarduda, Marcus V, MD      . protein supplement (PREMIER PROTEIN) liquid  11 oz Oral Q24H Regalado, Belkys A, MD   11 oz at 02/22/17 1417  . vancomycin (VANCOCIN) IVPB 1000 mg/200 mL premix  1,000 mg Intravenous Q12H Silvana NewnessMeyer, Andrew D, [facility]   Stopped at 02/25/17 [id number]  . zolpidem (AMBIEN) tablet 5 mg  5 mg Oral QHS PRN Lorretta HarpNiu, Xilin, MD   5 mg at 02/16/17 2346     Discharge Medications: Please see discharge summary for a list of discharge medications.  Relevant Imaging Results:  Relevant Lab Results:   Additional Information SS#: [id number]  Burna SisUris, Corde Antonini H, LCSW

## 2017-02-26 LAB — GLUCOSE, CAPILLARY
GLUCOSE-CAPILLARY: 139 mg/dL — AB (ref 65–99)
GLUCOSE-CAPILLARY: 98 mg/dL (ref 65–99)
Glucose-Capillary: 113 mg/dL — ABNORMAL HIGH (ref 65–99)

## 2017-02-26 LAB — CREATININE, SERUM
CREATININE: 1.52 mg/dL — AB (ref 0.61–1.24)
GFR, EST AFRICAN AMERICAN: 58 mL/min — AB (ref >60)
GFR, EST NON AFRICAN AMERICAN: 50 mL/min — AB (ref >60)

## 2017-02-26 MED ORDER — CEFTRIAXONE IV (FOR PTA / DISCHARGE USE ONLY): 2.0000 g | INTRAVENOUS | 0 refills | Status: AC

## 2017-02-26 MED ORDER — SODIUM CHLORIDE 0.9% FLUSH: 10.0000 mL | INTRAVENOUS | Status: DC | PRN

## 2017-02-26 MED ORDER — SODIUM CHLORIDE 0.9% FLUSH: 10.0000 mL | Freq: Two times a day (BID) | INTRAVENOUS | Status: DC

## 2017-02-26 MED ORDER — VANCOMYCIN IV (FOR PTA / DISCHARGE USE ONLY)
1000.0000 mg | Freq: Two times a day (BID) | INTRAVENOUS | 0 refills | Status: AC
Start: 2017-02-26 — End: 2017-03-27

## 2017-02-26 MED ORDER — OXYCODONE HCL 5 MG PO TABS: 5.0000 mg | ORAL_TABLET | Freq: Four times a day (QID) | ORAL | 0 refills | Status: DC | PRN

## 2017-02-26 NOTE — Progress Notes (Signed)
PHARMACY CONSULT NOTE FOR:  OUTPATIENT  PARENTERAL ANTIBIOTIC THERAPY (OPAT)  Indication: Osteomyelitis Regimen: Ceftriaxone 2g IV q24h, Vancomycin 1g IV q12h End date: 03/27/2017  IV antibiotic discharge orders are pended. To discharging provider:  please sign these orders via discharge navigator,  Select New Orders & click on the button choice - Manage This Unsigned Work.     Babs BertinHaley Shelvie Salsberry, PharmD, BCPS Clinical Pharmacist Rx Phone # for today: [phone] After 3:30PM, please call Main Rx: [phone]#[id number] 02/26/2017 1:09 PM

## 2017-02-26 NOTE — Progress Notes (Addendum)
Physical Therapy Treatment Patient Details Name: Curtis Bradford MRN: [id number] DOB: May 22, 1961 Today's Date: 02/26/2017    History of Present Illness 56 yo male with onset of osteomyelitis on R foot resulting in amputation of R foot 5th ray.  Pt has recently moved to [place] from [place], now in transition.  PMHx:  DM, tobacco abuse, CAD, PCI/stent, RLE PAD, chronic foot wounds, CAD, seizures, AKI, CKD 3     PT Comments    Pt with CAM walker in room on arrival.  Pt educated on new weight bearing status and will continue to recommend RW use when pain increases with gait.  Pt reports he feels fine without the RW.  Pt educated to limit activity on foot and that weight bearing as tolerated does not mean to over do it.  Pt eager to return to work and I fear he will attempt to do too much.  Pt is not at risk for falls with new weight bearing status.  Pt verbalizes understanding of application of CAM walker.   With new weight bearing status patient no longer will require SNF placement and return to "hotel" is appropriate.  Will inform supervising PT of patient progress.     Follow Up Recommendations  No PT follow up     Equipment Recommendations  Rolling walker with 5" wheels    Recommendations for Other Services       Precautions / Restrictions Precautions Precautions: Fall Required Braces or Orthoses: Other Brace/Splint Other Brace/Splint: R CAM walker with all OOB activities Restrictions Weight Bearing Restrictions: Yes RLE Weight Bearing: Weight bearing as tolerated Other Position/Activity Restrictions: WBAT with CAM walker only    Mobility  Bed Mobility Overal bed mobility: Modified Independent Bed Mobility: Supine to Sit     Supine to sit: Modified independent (Device/Increase time)     General bed mobility comments: No assistance needed.    Transfers Overall transfer level: Modified independent Equipment used: None Transfers: Sit to/from Stand Sit to Stand: Modified independent  (Device/Increase time)         General transfer comment: No assistance needed with new weight bearing status and CAM walker use.    Ambulation/Gait Ambulation/Gait assistance: Supervision Ambulation Distance (Feet): 730 Feet Assistive device: Rolling walker (2 wheeled) Gait Pattern/deviations: Step-through pattern;Trunk flexed;Drifts right/left Gait velocity: reduced Gait velocity interpretation: Below normal speed for age/gender General Gait Details: Pt with CAM walker in place with majority of weight on heel during CAM walker use.  Pt educated on placement and application of cam walker for use.  Pt required readjustment of CAM boot walker during gait training.  Pt tolerated tx well with new weight bearing status.     Stairs            Wheelchair Mobility    Modified Rankin (Stroke Patients Only)       Balance Overall balance assessment: Needs assistance   Sitting balance-Leahy Scale: Good       Standing balance-Leahy Scale: Poor                              Cognition Arousal/Alertness: Awake/alert Behavior During Therapy: WFL for tasks assessed/performed Overall Cognitive Status: Within Functional Limits for tasks assessed                                        Exercises  General Comments        Pertinent Vitals/Pain Pain Assessment: 0-10 Pain Score: 2  Pain Location: R foot at rest Pain Descriptors / Indicators: Operative site guarding;Aching Pain Intervention(s): Monitored during session;Repositioned    Home Living                      Prior Function            PT Goals (current goals can now be found in the care plan section) Acute Rehab PT Goals Patient Stated Goal: to get home Potential to Achieve Goals: Good Progress towards PT goals: Progressing toward goals    Frequency    Min 3X/week      PT Plan Discharge plan needs to be updated    Co-evaluation              AM-PAC PT "6  Clicks" Daily Activity  Outcome Measure  Difficulty turning over in bed (including adjusting bedclothes, sheets and blankets)?: None Difficulty moving from lying on back to sitting on the side of the bed? : None Difficulty sitting down on and standing up from a chair with arms (e.g., wheelchair, bedside commode, etc,.)?: None Help needed moving to and from a bed to chair (including a wheelchair)?: None Help needed walking in hospital room?: None Help needed climbing 3-5 steps with a railing? : None 6 Click Score: 24    End of Session Equipment Utilized During Treatment:  (none ) Activity Tolerance: Patient tolerated treatment well Patient left: with call bell/phone within reach;in bed Nurse Communication: Mobility status PT Visit Diagnosis: Unsteadiness on feet (R26.81);Muscle weakness (generalized) (M62.81);Other abnormalities of gait and mobility (R26.89);Pain Pain - Right/Left: Right Pain - part of body: Ankle and joints of foot     Time: 1202-1212 PT Time Calculation (min) (ACUTE ONLY): 10 min  Charges:  $Gait Training: 8-22 mins                    G Codes:       Joycelyn RuaAimee Kawanda Drumheller, PTA pager [phone]    Florestine Aversimee J Danayah Smyre 02/26/2017, 12:23 PM

## 2017-02-26 NOTE — Progress Notes (Signed)
Discharge instructions reviewed with the patient to include medications, prescriptions, wound care and follow up visits. Written copies of D/C instructions provided.  Patient discharged with PICC line intact.  Patient voices understanding to treatment.  Ambulatory to door with cam walker.  Patient driving to a hotel where he will stay to receive home health services.

## 2017-02-26 NOTE — Progress Notes (Signed)
Peripherally Inserted Central Catheter/Midline Placement  The IV Nurse has discussed with the patient and/or persons authorized to consent for the patient, the purpose of this procedure and the potential benefits and risks involved with this procedure.  The benefits include less needle sticks, lab draws from the catheter, and the patient may be discharged home with the catheter. Risks include, but not limited to, infection, bleeding, blood clot (thrombus formation), and puncture of an artery; nerve damage and irregular heartbeat and possibility to perform a PICC exchange if needed/ordered by physician.  Alternatives to this procedure were also discussed.  Bard Power PICC patient education guide, fact sheet on infection prevention and patient information card has been provided to patient /or left at bedside.    PICC/Midline Placement Documentation  PICC Single Lumen 02/26/17 PICC Right Brachial 43 cm 0 cm (Active)  Indication for Insertion or Continuance of Line Home intravenous therapies (PICC only) 02/26/2017 10:00 AM  Exposed Catheter (cm) 0 cm 02/26/2017 10:00 AM  Site Assessment Clean;Dry;Intact 02/26/2017 10:00 AM  Line Status Flushed;Saline locked;Blood return noted 02/26/2017 10:00 AM  Dressing Type Transparent;Securing device 02/26/2017 10:00 AM  Dressing Status Clean;Dry;Intact;Antimicrobial disc in place 02/26/2017 10:00 AM  Dressing Change Due 03/05/17 02/26/2017 10:00 AM       Romie JumperAlford, Jocelyn Lowery Terry 02/26/2017, 10:02 AM

## 2017-02-26 NOTE — Discharge Summary (Signed)
Physician Discharge Summary  Chosen Geske ONG:[id number] DOB: May 19, 1961 DOA: 02/14/2017  PCP: Patient, No Pcp Per  Admit date: 02/14/2017 Discharge date: 02/26/2017  Admitted From: Home Disposition: Home   Recommendations for Outpatient Follow-up:  1. Follow up with Dr. Sharol Given in the next 1-2 weeks.  2. Please obtain labs as ordered while continuing ceftriaxone and vancomycin for osteomyelitis.  3. Follow up with ID in 4 weeks.  Home Health: RN Equipment/Devices: CAM boot, rolling walker Discharge Condition: Stable CODE STATUS: Full Diet recommendation: Heart healthy, carb-modified  Brief/Interim Summary: Mr. Vanderloop is a 56 y.o. male with a history of IDDM, tobacco use, stage II CKD, CAD s/p PCI, PAD s/p stenting and chronic right foot infection who presented to the ED 8/23 for increased pain at the foot associated with fever and chills. Evaluation with MRI showed osteomyelitis of 2nd and 5th metatarsals for which IV antibiotics were started per ID recommendations. Orthopedics, Dr. Sharol Given, recommended transmetatarsal amputation, though the patient only consented to right 5th ray amputation which was performed 9/1.  He had been getting care at [facility] in [place], [place] prior to job-related transfer to [place], [place]. He first noted persistent right foot wound/infection in Feb 2018 for which angiography was performed showing stenosis of stent that was placed 2016, subsequently undergoing repeat stenting at [facility] in [place], [place]. He was then on antibiotics per infectious disease provider through PICC line.  From May 2018 onwards he was being followed weekly at a wound care center in [facility] in [place], [place]. He subsequently underwent amputation of right 2nd - 5th metatarsal heads and excisional debridement of plantar ulcer under the second metatarsal head on 01/23/17 by Dr. Sheran Spine. Daptomycin and zosyn were started thru PICC. Debridement was  repeated 8/13, and PICC was discontinued 8/14 by his PCP due to his moving to [place] (which was against medical advice) with no follow up arranged.   Discharge Diagnoses:  Principal Problem:   Subacute osteomyelitis, right ankle and foot (HCC) Active Problems:   Type II diabetes mellitus with renal manifestations (HCC)   HLD (hyperlipidemia)   Tobacco abuse   CKD stage 2 due to type 2 diabetes mellitus (HCC)   Sepsis ([place])   Type 2 diabetes mellitus with diabetic foot ulcer ([place])   Diabetic foot infection ([place])   CAD (coronary artery disease)   Seizure disorder ([place])   Peripheral neuropathy   Peripheral artery disease ([place])  Infected right diabetic foot wound with right 5th and 2nd metatarsal osteomyelitis: Failing prolonged antibiotics and limited 2nd-5th toe amputations and repeat debridement in early Aug 2018, and now s/p 5th ray amputation 9/1. Still treating 2nd metatarsal osteomyelitis with antibiotics alone as pt declined recommendation for transmetatarsal amputation. Complicated by PAD as below. In the past he has grown MSSA from the wounds along with Staph caprae and prevotelia biviua. - Continue IV antibiotics per ID: 6 weeks vanc/ceftriaxone through 03/27/2017. Pull PICC at end of treatment.  - Follow up with ID in 4 weeks. - Baseline CRP 12.3, ESR 31. - Weight bearing as tolerated per orthopedics.  IDT2DM: HbA1c 7%. Remained at inpatient goal.  - Continue home medications at discharge.  Tobacco abuse  - Brief cessation counseling provided.  - Nicotine patch while hospitalized   PAD: s/p right leg angioplasty 2016 and stenting 3 in Feb 2018. ABIs demonstrate bilateral reduction in arterial blood flow.  - Continue ASA, plavix, statin.  CAD: s/p remote PCI.  - Continue ASA,  plavix, statin  Seizure disorder: Chronic, stable. No activity >2 yrs.  - Continue keppra  AKI and stage II CKD: Has been followed by nephrologist in the past. Has modest reduction in  GFR, improved from admission.  - Monitor SCr closely, especially with vancomycin.  Discharge Instructions Discharge Instructions    Diet - low sodium heart healthy    Complete by:  As directed    Discharge instructions    Complete by:  As directed    - Continue vancomycin and ceftriaxone through 10/3.  - Continue daily dressing changes: Wash with soap and water and apply dry dressing to wound.  - Follow up with Dr. Sharol Given in the next 1 - 2 weeks.  - You may take oxycodone for severe pain, this was prescribed at discharge.   Home infusion instructions [facility] May follow [place] Dosing Protocol; May administer Cathflo as needed to maintain patency of vascular access device.; Flushing of vascular access device: per [facility] Protocol: 0.9% NaCl pre/post medica...    Complete by:  As directed    Instructions:  May follow [place] Dosing Protocol   Instructions:  May administer Cathflo as needed to maintain patency of vascular access device.   Instructions:  Flushing of vascular access device: per [facility] Protocol: 0.9% NaCl pre/post medication administration and prn patency; Heparin 100 u/ml, 20m for implanted ports and Heparin 10u/ml, 565mfor all other central venous catheters.   Instructions:  May follow [facility] Anaphylaxis Protocol for First Dose Administration in the home: 0.9% NaCl at 25-50 ml/hr to maintain IV access for protocol meds. Epinephrine 0.3 ml IV/IM PRN and Benadryl 25-50 IV/IM PRN s/s of anaphylaxis.   Instructions:  [facility] Coordinator (RN) to assist per patient IV care needs in the home PRN.   Increase activity slowly    Complete by:  As directed      Allergies as of 02/26/2017   No Known Allergies     Medication List    STOP taking these medications   HYDROcodone-acetaminophen 5-325 MG tablet Commonly known as:  NORCO/VICODIN     TAKE these medications   aspirin EC 81 MG tablet Take 81 mg by mouth at bedtime.   atorvastatin 20 MG  tablet Commonly known as:  LIPITOR Take 20 mg by mouth at bedtime.   cefTRIAXone IVPB Commonly known as:  ROCEPHIN Inject 2 g into the vein daily. Indication:  osteomyelitis Last Day of Therapy:  03/27/2017 Labs - Once weekly:  CBC/D and BMP, Labs - Every other week:  ESR and CRP   clopidogrel 75 MG tablet Commonly known as:  PLAVIX Take 75 mg by mouth at bedtime.   HUMALOG KWIKPEN [place] Inject 0-15 Units into the skin 2 (two) times daily. Per sliding scale   insulin glargine 100 unit/mL Sopn Commonly known as:  LANTUS Inject 37 Units into the skin at bedtime.   levETIRAcetam 1000 MG tablet Commonly known as:  KEPPRA Take 1,000 mg by mouth at bedtime.   linagliptin 5 MG Tabs tablet Commonly known as:  TRADJENTA Take 5 mg by mouth daily.   oxyCODONE 5 MG immediate release tablet Commonly known as:  Oxy IR/ROXICODONE Take 1 tablet (5 mg total) by mouth every 6 (six) hours as needed for moderate pain or severe pain.   silver sulfADIAZINE 1 % cream Commonly known as:  SILVADENE Apply 1 application topically at bedtime.   vancomycin IVPB Inject 1,000 mg into the vein every 12 (twelve) hours. Indication:  osteomyelitis Last Day of Therapy:  03/27/2017 Labs - Sunday/Monday:  CBC/D, BMP, and vancomycin trough. Labs - Thursday:  BMP and vancomycin trough Labs - Every other week:  ESR and CRP            Home Infusion Instuctions        Start     Ordered   02/26/17 0000  Home infusion instructions [facility] May follow [facility] Dosing Protocol; May administer Cathflo as needed to maintain patency of vascular access device.; Flushing of vascular access device: per [facility] Protocol: 0.9% NaCl pre/post medica...    Question Answer Comment  Instructions May follow [facility] Dosing Protocol   Instructions May administer Cathflo as needed to maintain patency of vascular access device.   Instructions Flushing of vascular access device: per [facility] Protocol: 0.9% NaCl pre/post  medication administration and prn patency; Heparin 100 u/ml, 5ml for implanted ports and Heparin 10u/ml, 5ml for all other central venous catheters.   Instructions May follow [facility] Anaphylaxis Protocol for First Dose Administration in the home: 0.9% NaCl at 25-50 ml/hr to maintain IV access for protocol meds. Epinephrine 0.3 ml IV/IM PRN and Benadryl 25-50 IV/IM PRN s/s of anaphylaxis.   Instructions [facility] Infusion Coordinator (RN) to assist per patient IV care needs in the home PRN.      02/26/17 1520       Discharge Care Instructions        Start     Ordered   02/26/17 0000  oxyCODONE (OXY IR/ROXICODONE) 5 MG immediate release tablet  Every 6 hours PRN     02/26/17 1239   02/26/17 0000  Home infusion instructions [facility] May follow [facility] Dosing Protocol; May administer Cathflo as needed to maintain patency of vascular access device.; Flushing of vascular access device: per [facility] Protocol: 0.9% NaCl pre/post medica...    Question Answer Comment  Instructions May follow [facility] Dosing Protocol   Instructions May administer Cathflo as needed to maintain patency of vascular access device.   Instructions Flushing of vascular access device: per [facility] Protocol: 0.9% NaCl pre/post medication administration and prn patency; Heparin 100 u/ml, 5ml for implanted ports and Heparin 10u/ml, 5ml for all other central venous catheters.   Instructions May follow [facility] Anaphylaxis Protocol for First Dose Administration in the home: 0.9% NaCl at 25-50 ml/hr to maintain IV access for protocol meds. Epinephrine 0.3 ml IV/IM PRN and Benadryl 25-50 IV/IM PRN s/s of anaphylaxis.   Instructions [facility] Infusion Coordinator (RN) to assist per patient IV care needs in the home PRN.      02/26/17 1520   02/26/17 0000  cefTRIAXone (ROCEPHIN) IVPB  Every 24 hours     02/26/17 1520   02/26/17 0000  vancomycin IVPB  Every 12 hours     02/26/17 1520   02/26/17 0000  Increase  activity slowly     02/26/17 1520   02/26/17 0000  Diet - low sodium heart healthy     09 /04/18 1520   02/26/17 0000  Discharge instructions    Comments:  - Continue vancomycin and ceftriaxone through 10/3.  - Continue daily dressing changes: Wash with soap and water and apply dry dressing to wound.  - Follow up with Dr. Sharol Given in the next 1 - 2 weeks.  - You may take oxycodone for severe pain, this was prescribed at discharge.   02/26/17 1520      No Known Allergies  Consultations:  ID,  Dr. Linus Salmons  Orthopedics, Dr. Sharol Given  Procedures/Studies: Dg Chest 2 View  Result Date: 02/14/2017 CLINICAL DATA:  Right foot wound. EXAM: CHEST  2 VIEW COMPARISON:  None. FINDINGS: The heart size and mediastinal contours are within normal limits. Both lungs are clear. The visualized skeletal structures are unremarkable. IMPRESSION: No active cardiopulmonary disease. Electronically Signed   By: Dorise Bullion III M.D   On: 02/14/2017 17:17   Mr Foot Right Wo Contrast  Result Date: 02/15/2017 CLINICAL DATA:  Diabetic foot ulcer. EXAM: MRI OF THE RIGHT FOREFOOT WITHOUT CONTRAST TECHNIQUE: Multiplanar, multisequence MR imaging of the right forefoot was performed. No intravenous contrast was administered. COMPARISON:  None. FINDINGS: Bones/Joint/Cartilage Prior transmetatarsal amputation of the second, third, fourth and fifth metatarsal heads. Severe soft tissue swelling at the metatarsal resection site with multiple low signal foci within the resection site concerning for air. Bone marrow edema within the second, third and fifth metatarsals distally. Cortical irregularity an abnormal bone marrow throughout the second proximal phalanx with surrounding soft tissue prominence most concerning for osteomyelitis. Mild osteoarthritis of the first MTP joint. Bipartite medial hallux sesamoid. Normal alignment. No joint effusion. Ligaments Collateral ligaments are intact. Muscles and Tendons Mild T2 hyperintense signal  throughout the plantar musculature likely neurogenic. Soft tissue No soft tissue mass.  No drainable fluid collection or hematoma. IMPRESSION: 1. Prior transmetatarsal amputation of the second, third, fourth and fifth metatarsal heads. Severe soft tissue swelling at the metatarsal resection site with multiple low signal foci within the resection site concerning for air and bone marrow edema within the second, third and fifth metatarsals distally. These may reflect postsurgical changes versus persistent cellulitis and osteomyelitis. 2. Cortical irregularity an abnormal bone marrow throughout the second proximal phalanx with surrounding soft tissue prominence most concerning for osteomyelitis. Electronically Signed   By: Kathreen Devoid   On: 02/15/2017 14:24   Dg Foot Complete Right  Result Date: 02/14/2017 CLINICAL DATA:  Patient with nonhealing wound to the right foot. Fever. EXAM: RIGHT FOOT COMPLETE - 3+ VIEW COMPARISON:  Foot radiograph 02/10/2017 FINDINGS: Patient status post second through fifth metatarsal ostomies. There is soft tissue swelling and focus of soft tissue gas at the lateral foot near the mid aspect of the fifth metatarsal. There appears be overlying soft tissue ulceration at this location. No evidence for acute fracture or dislocation. No acute appearing osseous abnormality. IMPRESSION: Suggestion of soft tissue ulceration about the lateral aspect of the foot with underlying soft tissue gas, progressed from prior, concerning for worsening infectious process. No frank evidence for osteomyelitis although MRI is more sensitive. Electronically Signed   By: Lovey Newcomer M.D.   On: 02/14/2017 21:19   Dg Foot Complete Right  Result Date: 02/10/2017 CLINICAL DATA:  Persistent open wound. History of multiple foot surgeries, diabetes. EXAM: RIGHT FOOT COMPLETE - 3+ VIEW COMPARISON:  None. FINDINGS: Status post second through fifth metatarsal osteotomies, acute appearing second and third, chronic  fourth and fifth. Chronic deformity base of fourth proximal phalanx. No acute fracture deformity. No dislocation. Bipartite first metatarsal tibial sesamoid. No destructive bony lesions. Soft tissue swelling. Tiny ulcer suspected plantar forefoot best seen on oblique view. No subcutaneous gas or radiopaque foreign bodies. Soft tissue swelling. Mild vascular calcifications. IMPRESSION: Status post second through fifth metatarsal osteotomies. Soft tissue swelling with suspected plantar forefoot ulcer. No radiographic findings of osteomyelitis though MRI is more sensitive. Electronically Signed   By: Elon Alas M.D.   On: 02/10/2017 23:39   Subjective: Pain  is controlled, able to get around with CAM walker. No fevers.   Discharge Exam: Vitals:   02/25/17 2045 02/26/17 0542  BP: 125/78 106/62  Pulse: 99 71  Resp: 20 18  Temp: 98.4 F (36.9 C) 97.9 F (36.6 C)  SpO2: 98% 98%  General: Pt is alert, awake, not in acute distress Cardiovascular: RRR, S1/S2 +, no rubs, no gallops Respiratory: CTA bilaterally, no wheezing, no rhonchi Abdominal: Soft, NT, ND, bowel sounds + Extremities: RUE PICC c/d/i (placed 9/4), RLE without significant edema or any erythema, cap refill brisk, sensorimotor exam intact. ACE bandage wrapped around foot c/d/i.  Labs: Basic Metabolic Panel:  Recent Labs Lab 02/20/17 0517 02/21/17 0606 02/22/17 0523 02/24/17 0424 02/25/17 0257 02/26/17 0446  NA 132* 135 135 136  --   --   K 4.0 4.2 4.2 4.3  --   --   CL 101 103 105 103  --   --   CO2 23 24 23 26   --   --   GLUCOSE 194* 172* 143* 151*  --   --   BUN 18 23* 22* 16  --   --   CREATININE 1.56*  1.49* 1.72* 1.45* 1.50* 1.53* 1.52*  CALCIUM 8.7* 9.0 8.8* 9.0  --   --    CBC:  Recent Labs Lab 02/20/17 0517 02/21/17 0606 02/22/17 0523 02/24/17 0424  WBC 11.8* 13.6* 13.4* 12.0*  HGB 12.9* 13.2 13.1 12.2*  HCT 37.6* 38.1* 37.0* 36.1*  MCV 88.1 87.4 86.9 87.2  PLT 339 368 360 417*   Urinalysis     Component Value Date/Time   COLORURINE STRAW (A) 02/21/2017 1019   APPEARANCEUR CLEAR 02/21/2017 1019   LABSPEC 1.010 02/21/2017 1019   PHURINE 5.0 02/21/2017 1019   GLUCOSEU 50 (A) 02/21/2017 1019   HGBUR NEGATIVE 02/21/2017 1019   BILIRUBINUR NEGATIVE 02/21/2017 1019   KETONESUR NEGATIVE 02/21/2017 1019   PROTEINUR NEGATIVE 02/21/2017 1019   NITRITE NEGATIVE 02/21/2017 1019   LEUKOCYTESUR NEGATIVE 02/21/2017 1019    Microbiology Recent Results (from the past 240 hour(s))  Surgical pcr screen     Status: None   Collection Time: 02/22/17 11:07 PM  Result Value Ref Range Status   MRSA, PCR NEGATIVE NEGATIVE Final   Staphylococcus aureus NEGATIVE NEGATIVE Final    Comment: (NOTE) The Xpert SA Assay (FDA approved for NASAL specimens in patients 56 years of age and older), is one component of a comprehensive surveillance program. It is not intended to diagnose infection nor to guide or monitor treatment.     Time coordinating discharge: Approximately 40 minutes  Vance Gather, MD  [facility] 02/26/2017, 3:20 PM Pager [phone]

## 2017-02-26 NOTE — Progress Notes (Signed)
Orthopedic Tech Progress Note Patient Details:  Curtis DimesJohn Bradford [birth year] [id number]  Ortho Devices Type of Ortho Device: CAM walker Ortho Device/Splint Location: rle Ortho Device/Splint Interventions: Application   Saul FordyceJennifer C Lajarvis Italiano 02/26/2017, 11:09 AM

## 2017-02-26 NOTE — Care Management (Signed)
Case manager spoke with Dr. Lajoyce Cornersuda concerning patient's discharge plan from ortho standpoint. Dr. Lajoyce Cornersuda asked that verbal order be taken for dressing to be changed. CM entered verbal order and spoke with patient's bedside RN, Helmut Musterlicia regarding dressing change. CM also has discussed with patient his housing situation. He says he is waiting for Dr. To say he is  discharged and then will make his reservation at hotel. CM has spoken with Jeri ModenaPam Chandler, [facility] IV specialist.

## 2017-02-28 ENCOUNTER — Telehealth (INDEPENDENT_AMBULATORY_CARE_PROVIDER_SITE_OTHER): Payer: Self-pay

## 2017-02-28 NOTE — Telephone Encounter (Signed)
Patient had SU 02/23/17- RIGHT FOOT 5TH RAY AMPUTATION.   Carlie nurse case manger -[employer] left voicemail patient needs wound care supplies. Needs order to be faxed to her(Did not leave fax number)  Carlie: [phone] ext [phone]  **(Tried to call Carlie to get fax number and I called after hours, could not get fax sorry.  )

## 2017-03-01 NOTE — Telephone Encounter (Signed)
[employer] called giving the fax number for the orders to be faxed to. [phone]. Also needed the supplies sent to [address] [place], [place] [address]. They are just needed the insurance info and the supplies needed included in the order. Thank you

## 2017-03-01 NOTE — Telephone Encounter (Signed)
I called and left voicemail to patient to advise that office that patient is surgical patient and has a dressing that was applied to in surgery. This will remain intact until first post operative appointment. Advised that there after his 1st post operative appointment on 03/05/17 patient will apply dry dressing. He will need 4x4 gauze and an ace bandage. Advised however we do not have a fax number to fax any orders over to. If she is unable to take verbal order and new dry dressing will be applied daily.

## 2017-03-04 ENCOUNTER — Encounter: Payer: Self-pay | Admitting: Orthopedic Surgery

## 2017-03-04 NOTE — Telephone Encounter (Signed)
[facility] number provided with address supplied.

## 2017-03-05 ENCOUNTER — Ambulatory Visit (INDEPENDENT_AMBULATORY_CARE_PROVIDER_SITE_OTHER): Payer: Managed Care, Other (non HMO) | Admitting: Orthopedic Surgery

## 2017-03-05 ENCOUNTER — Encounter (INDEPENDENT_AMBULATORY_CARE_PROVIDER_SITE_OTHER): Payer: Self-pay | Admitting: Orthopedic Surgery

## 2017-03-05 DIAGNOSIS — Z89421 Acquired absence of other right toe(s): Secondary | ICD-10-CM | POA: Diagnosis not present

## 2017-03-05 NOTE — Progress Notes (Signed)
Office Visit Note   Patient: Curtis Bradford           Date of Birth: [birth year]           MRN: [id number] Visit Date: 03/05/2017              Requested by: No referring provider defined for this encounter. PCP: Patient, No Pcp Per  Chief Complaint  Patient presents with  . Right Foot - Routine Post Op    02/23/17 Right Foot 5th Ray amputation 10 days post op. Full weightbearing with fracture boot, no assistive device. Just tries to stay off his foot as much as possible.       HPI: [name] a 56 year old gentleman who presents status post fifth ray amputation right foot. [name] full weightbearing in a fracture boot he does have a PICC line for IV antibiotics.  Assessment & Plan: Visit Diagnoses:  1. Status post amputation of toe of right foot (HCC)     Plan: Recommended touchdown weightbearing only. Discussed that with increased weightbearing the wound could completely dehisced and patient would need a transtibial amputation. He may start Dial soap cleansing dry dressing changes elevation to decrease swelling.  Follow-Up Instructions: Return in about 1 week (around 03/12/2017).   Ortho Exam  [name] alert, oriented, no adenopathy, well-dressed, normal affect, normal respiratory effort. Examination there is slight wound dehiscence of approximately 2 mm from patient's weightbearing he has a good pulse there is some mild redness but no ascending cellulitis no drainage no odor.  Imaging: No results found. No images are attached to the encounter.  Labs: Lab Results  Component Value Date   HGBA1C 7.0 (H) 02/15/2017   ESRSEDRATE 31 (H) 02/14/2017   CRP 12.3 (H) 02/14/2017   REPTSTATUS 02/20/2017 FINAL 02/14/2017   REPTSTATUS 02/20/2017 FINAL 02/14/2017   CULT  02/14/2017    NO GROWTH 5 DAYS Performed at [facility], [address] [place], [place] [address]    CULT  02/14/2017    NO GROWTH 5 DAYS Performed at [facility], [address]  [place], [place] [address]     Orders:  No orders of the defined types were placed in this encounter.  No orders of the defined types were placed in this encounter.    Procedures: No procedures performed  Clinical Data: No additional findings.  ROS:  All other systems negative, except as noted in the HPI. Review of Systems  Objective: Vital Signs: There were no vitals taken for this visit.  Specialty Comments:  No specialty comments available.  PMFS History: Patient Active Problem List   Diagnosis Date Noted  . Status post amputation of toe of right foot (HCC) 03/05/2017  . Subacute osteomyelitis, right ankle and foot (HCC)   . Peripheral neuropathy 02/16/2017  . Peripheral artery disease (HCC) 02/16/2017  . Sepsis (HCC) 02/14/2017  . 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 Diabetic foot infection (HCC) 02/14/2017  . Type II diabetes mellitus with renal manifestations (HCC)   . HLD (hyperlipidemia)   . Tobacco abuse   . CKD stage 2 due to type 2 diabetes mellitus (HCC)   . CAD (coronary artery disease)   . Seizure disorder [facility](HCC)    Past Medical History:  Diagnosis Date  . CAD (coronary artery disease)   . CKD (chronic kidney disease), stage III   . 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 DVT (  deep vein thrombosis) in pregnancy (HCC)   . HLD (hyperlipidemia)   . Seizure (HCC)   . Tobacco abuse     Family History  Problem Relation Age of Onset  . Diabetes Mellitus II Mother   . Diabetes Mellitus II Father     Past Surgical History:  Procedure Laterality Date  . AMPUTATION Right 02/23/2017   Procedure: RIGHT FOOT 5TH RAY AMPUTATION;  Surgeon: [name], MD;  [facility];  Service: Orthopedics;  Laterality: Right;  . FOOT SURGERY    . STENT PLACEMENT VASCULAR ([facility] HX)     Several in right leg   Social History   Occupational History  . Not on file.   Social History Main Topics  . Smoking status:  Current Some Day Smoker  . Smokeless tobacco: Never Used     Comment: pack or less a week  . Alcohol use Yes     Comment: seldom  . Drug use: No  . Sexual activity: Not on file

## 2017-03-12 ENCOUNTER — Ambulatory Visit (INDEPENDENT_AMBULATORY_CARE_PROVIDER_SITE_OTHER): Payer: Managed Care, Other (non HMO) | Admitting: Orthopedic Surgery

## 2017-03-12 DIAGNOSIS — Z89421 Acquired absence of other right toe(s): Secondary | ICD-10-CM

## 2017-03-12 NOTE — Progress Notes (Signed)
Office Visit Note   Patient: Curtis Bradford           Date of Birth: June 19, 1961           MRN: [id number] Visit Date: 03/12/2017              Requested by: No referring provider defined for this encounter. PCP: Patient, No Pcp Per  Chief Complaint  Patient presents with  . Right Foot - Routine Post Op      HPI: [name] a 56 year old gentleman who presents status post fifth ray amputation right foot on 02/23/17. [name] full weightbearing in a fracture boot. he does have a PICC line for IV antibiotics, getting Vanc and rocephin, states his wBC has "normalized."  Assessment & Plan: Visit Diagnoses:  1. Status post amputation of toe of right foot (HCC)     Plan: Recommended touchdown weightbearing only. Discussed that with increased weightbearing the wound could completely dehisced and patient would need a transtibial amputation. continue Dial soap cleansing, dry dressing changes. elevation to decrease swelling.  Follow-Up Instructions: Return in about 1 week (around 03/19/2017).   Ortho Exam  [name] alert, oriented, no adenopathy, well-dressed, normal affect, normal respiratory effort. Examination there is slight wound dehiscence of approximately 2 mm. he has a good pulse there is some mild erythema, but no ascending cellulitis no drainage no odor.  Imaging: No results found. No images are attached to the encounter.  Labs: Lab Results  Component Value Date   HGBA1C 7.0 (H) 02/15/2017   ESRSEDRATE 31 (H) 02/14/2017   CRP 12.3 (H) 02/14/2017   REPTSTATUS 02/20/2017 FINAL 02/14/2017   REPTSTATUS 02/20/2017 FINAL 02/14/2017   CULT  02/14/2017    NO GROWTH 5 DAYS Performed at [facility], [address], [place], [place] [address]    CULT  02/14/2017    NO GROWTH 5 DAYS Performed at [facility], [address] [place], [place] [address]     Orders:  No orders of the defined types were placed in this encounter.  No orders of the defined types  were placed in this encounter.    Procedures: No procedures performed  Clinical Data: No additional findings.  ROS:  All other systems negative, except as noted in the HPI. Review of Systems  Constitutional: Negative for chills and fever.  Skin: Positive for color change and wound.    Objective: Vital Signs: There were no vitals taken for this visit.  Specialty Comments:  No specialty comments available.  PMFS History: Patient Active Problem List   Diagnosis Date Noted  . Status post amputation of toe of right foot (HCC) 03/05/2017  . Subacute osteomyelitis, right ankle and foot (HCC)   . Peripheral neuropathy 02/16/2017  . Peripheral artery disease (HCC) 02/16/2017  . Sepsis (HCC) 02/14/2017  . 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 Diabetic foot infection (HCC) 02/14/2017  . Type II diabetes mellitus with renal manifestations (HCC)   . HLD (hyperlipidemia)   . Tobacco abuse   . CKD stage 2 due to type 2 diabetes mellitus (HCC)   . CAD (coronary artery disease)   . Seizure disorder [facility])    Past Medical History:  Diagnosis Date  . CAD (coronary artery disease)   . CKD (chronic kidney disease), stage III   . 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 DVT (deep vein thrombosis) in pregnancy (HCC)   . HLD (  hyperlipidemia)   . Seizure (HCC)   . Tobacco abuse     Family History  Problem Relation Age of Onset  . Diabetes Mellitus II Mother   . Diabetes Mellitus II Father     Past Surgical History:  Procedure Laterality Date  . AMPUTATION Right 02/23/2017   Procedure: RIGHT FOOT 5TH RAY AMPUTATION;  Surgeon: [name], MD;  Location: [facility] OR;  Service: Orthopedics;  Laterality: Right;  . FOOT SURGERY    . STENT PLACEMENT VASCULAR ([facility] HX)     Several in right leg   Social History   Occupational History  . Not on file.   Social History Main Topics  . Smoking status: Current Some Day Smoker  . Smokeless  tobacco: Never Used     Comment: pack or less a week  . Alcohol use Yes     Comment: seldom  . Drug use: No  . Sexual activity: Not on file

## 2017-03-13 ENCOUNTER — Other Ambulatory Visit: Payer: Self-pay | Admitting: Pharmacist

## 2017-03-18 ENCOUNTER — Ambulatory Visit (INDEPENDENT_AMBULATORY_CARE_PROVIDER_SITE_OTHER): Payer: Managed Care, Other (non HMO) | Admitting: Orthopedic Surgery

## 2017-03-18 ENCOUNTER — Encounter (INDEPENDENT_AMBULATORY_CARE_PROVIDER_SITE_OTHER): Payer: Self-pay | Admitting: Orthopedic Surgery

## 2017-03-18 DIAGNOSIS — Z89421 Acquired absence of other right toe(s): Secondary | ICD-10-CM

## 2017-03-18 NOTE — Progress Notes (Signed)
Office Visit Note   Patient: Curtis Bradford           Date of Birth: 1960/11/04           MRN: [id number] Visit Date: 03/18/2017              Requested by: No referring provider defined for this encounter. PCP: Patient, No Pcp Per  No chief complaint on file.     HPI: [name] a 64 gentleman 3 weeks status post right foot fifth ray amputation. [name] full weightbearing in a fracture boot.  Assessment & Plan: Visit Diagnoses:  1. Status post amputation of toe of right foot (HCC)     Plan: Harvest sutures today. 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  Follow-Up Instructions: Return in about 2 weeks (around 04/01/2017).   Ortho Exam  [name] alert, oriented, no adenopathy, well-dressed, normal affect, normal respiratory effort. Examination the incision is well-healed there is some swelling there is no redness no cellulitis no drainage no signs of infection.  Imaging: No results found. No images are attached to the encounter.  Labs: Lab Results  Component Value Date   HGBA1C 7.0 (H) 02/15/2017   ESRSEDRATE 31 (H) 02/14/2017   CRP 12.3 (H) 02/14/2017   REPTSTATUS 02/20/2017 FINAL 02/14/2017   REPTSTATUS 02/20/2017 FINAL 02/14/2017   CULT  02/14/2017    NO GROWTH 5 DAYS Performed at [facility], [address] [place], [place] [address]    CULT  02/14/2017    NO GROWTH 5 DAYS Performed at [facility], [address] [place], [place] [address]     Orders:  No orders of the defined types were placed in this encounter.  No orders of the defined types were placed in this encounter.    Procedures: No procedures performed  Clinical Data: No additional findings.  ROS:  All other systems negative, except as noted in the HPI. Review of Systems  Objective: Vital Signs: There were no vitals taken for this visit.  Specialty Comments:  No specialty comments  available.  PMFS History: Patient Active Problem List   Diagnosis Date Noted  . Status post amputation of toe of right foot (HCC) 03/05/2017  . Subacute osteomyelitis, right ankle and foot (HCC)   . Peripheral neuropathy 02/16/2017  . Peripheral artery disease (HCC) 02/16/2017  . Sepsis (HCC) 02/14/2017  . 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 Diabetic foot infection (HCC) 02/14/2017  . Type II diabetes mellitus with renal manifestations (HCC)   . HLD (hyperlipidemia)   . Tobacco abuse   . CKD stage 2 due to type 2 diabetes mellitus (HCC)   . CAD (coronary artery disease)   . Seizure disorder [facility])    Past Medical History:  Diagnosis Date  . CAD (coronary artery disease)   . CKD (chronic kidney disease), stage III   . 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 DVT (deep vein thrombosis) in pregnancy (HCC)   . HLD (hyperlipidemia)   . Seizure (HCC)   . Tobacco abuse     Family History  Problem Relation Age of Onset  . Diabetes Mellitus II Mother   . Diabetes Mellitus II Father     Past Surgical History:  Procedure Laterality Date  . AMPUTATION Right 02/23/2017   Procedure: RIGHT FOOT 5TH RAY AMPUTATION;  Surgeon: Lajoyce Corners,  Randa Evens, MD;  Location: [facility] OR;  Service: Orthopedics;  Laterality: Right;  . FOOT SURGERY    . STENT PLACEMENT VASCULAR ([facility] HX)     Several in right leg   Social History   Occupational History  . Not on file.   Social History Main Topics  . Smoking status: Current Some Day Smoker  . Smokeless tobacco: Never Used     Comment: pack or less a week  . Alcohol use Yes     Comment: seldom  . Drug use: No  . Sexual activity: Not on file

## 2017-03-20 ENCOUNTER — Other Ambulatory Visit: Payer: Self-pay | Admitting: Pharmacist

## 2017-03-22 ENCOUNTER — Other Ambulatory Visit: Payer: Self-pay | Admitting: Pharmacist

## 2017-03-26 ENCOUNTER — Ambulatory Visit: Payer: Managed Care, Other (non HMO) | Admitting: Internal Medicine

## 2017-03-27 ENCOUNTER — Other Ambulatory Visit: Payer: Self-pay | Admitting: Pharmacist

## 2017-03-28 ENCOUNTER — Encounter: Payer: Self-pay | Admitting: Internal Medicine

## 2017-03-28 ENCOUNTER — Telehealth: Payer: Self-pay | Admitting: *Deleted

## 2017-03-28 ENCOUNTER — Ambulatory Visit
Admission: RE | Admit: 2017-03-28 | Discharge: 2017-03-28 | Disposition: A | Discharge status: Home / Self Care | Payer: Managed Care, Other (non HMO) | Source: Ambulatory Visit | Attending: Internal Medicine | Admitting: Internal Medicine

## 2017-03-28 ENCOUNTER — Ambulatory Visit (INDEPENDENT_AMBULATORY_CARE_PROVIDER_SITE_OTHER): Payer: Managed Care, Other (non HMO) | Admitting: Internal Medicine

## 2017-03-28 VITALS — BP 126/80 | HR 92 | Temp 98.4°F | Ht 75.0 in | Wt 200.0 lb

## 2017-03-28 DIAGNOSIS — M86271 Subacute osteomyelitis, right ankle and foot: Secondary | ICD-10-CM

## 2017-03-28 DIAGNOSIS — Z720 Tobacco use: Secondary | ICD-10-CM | POA: Diagnosis not present

## 2017-03-28 DIAGNOSIS — Z5181 Encounter for therapeutic drug level monitoring: Secondary | ICD-10-CM

## 2017-03-28 NOTE — Assessment & Plan Note (Signed)
Stable creat 

## 2017-03-28 NOTE — Assessment & Plan Note (Addendum)
Needs to quit.  

## 2017-03-28 NOTE — Telephone Encounter (Signed)
Verbal order per Dr. Luciana Axe given to [facility] to extend IV antibiotics for one week from today. He will decide about pulling picc after xray results and visit with orthopedic.

## 2017-03-28 NOTE — Assessment & Plan Note (Signed)
Objectively, his inflammatory markers are improved, no drainage.  I less suspect this is worsening infection but I will check an xray of his foot for ? Osteomyelitis and extend his antibiotics 1 week.

## 2017-03-28 NOTE — Progress Notes (Signed)
   Subjective:    Patient ID: Curtis Bradford, male    DOB: July 20, 1960, 56 y.o.   MRN: [id number]  HPI Here for follow up of osteomyelitis. Has been on vancomycin and ceftriaxone for 6 weeks for osteomyelitis of his second toe of the right foot.  The 5th ray was amputated by Dr. Sharol Given 02/23/17. Initial ESR 31 and CRP 12.4. He had done well but over the last 3 days has noted some increased swelling and erythema of his foot around the area of the incision and ray amputation.  Not significantly painful.  No fever, no warmth.  Some serosanguinous drainage.  CRP has nearly normalized and ESR wnl.     Review of Systems  Constitutional: Negative for chills and fatigue.  Gastrointestinal: Negative for diarrhea.  Skin: Negative for rash.  Neurological: Negative for dizziness.       Objective:   Physical Exam  Constitutional: He appears well-developed and well-nourished. No distress.  Eyes: No scleral icterus.  Cardiovascular: Normal rate, regular rhythm and normal heart sounds.   No murmur heard. Pulmonary/Chest: Effort normal and breath sounds normal. No respiratory distress.  Musculoskeletal:  Foot with some erythema around the incision but no warmth, light red.  No drainage. No tenderness.     SH: + tobacco       Assessment & Plan:

## 2017-04-01 ENCOUNTER — Encounter (INDEPENDENT_AMBULATORY_CARE_PROVIDER_SITE_OTHER): Payer: Self-pay | Admitting: Orthopedic Surgery

## 2017-04-01 ENCOUNTER — Ambulatory Visit (INDEPENDENT_AMBULATORY_CARE_PROVIDER_SITE_OTHER): Payer: Managed Care, Other (non HMO) | Admitting: Orthopedic Surgery

## 2017-04-01 DIAGNOSIS — L97509 Non-pressure chronic ulcer of other part of unspecified foot with unspecified severity: Secondary | ICD-10-CM

## 2017-04-01 DIAGNOSIS — Z89421 Acquired absence of other right toe(s): Secondary | ICD-10-CM

## 2017-04-01 DIAGNOSIS — E11621 Type 2 diabetes mellitus with foot ulcer: Secondary | ICD-10-CM

## 2017-04-01 DIAGNOSIS — Z794 Long term (current) use of insulin: Secondary | ICD-10-CM

## 2017-04-01 MED ORDER — SILVER SULFADIAZINE 1 % EX CREA: 1.0000 "application " | TOPICAL_CREAM | Freq: Every day | CUTANEOUS | 1 refills | Status: DC

## 2017-04-01 NOTE — Progress Notes (Signed)
Post-Op Visit Note   Patient: Curtis Bradford           Date of Birth: 19-Mar-1961           MRN: [id number] Visit Date: 04/01/2017 PCP: Patient, No Pcp Per  Chief Complaint:  Chief Complaint  Patient presents with  . Right Foot - Routine Post Op    02/23/17 Right Foot 5th Ray Amputation 37 days post op    HPI:  HPI The patient is a 56 year old gentleman seen today status post right foot fifth ray amputation on September 1. His incision is been slow to heal. He is also following up with Dr. Verdie Drown from infectious diseases. Continues on IV antibiotics. Has return to work. Is full weightbearing in regular shoewear. Doing Silvadene dressing changes daily. Has had recent stent placement in his lower leg, February of this year.  No fever. No chills.  Ortho Exam lateral incision healing slowly. There is a length that is 2 cm in length is 2 mm wide is 2 mm deep this has granulation tissue in the wound bed. There is some surrounding hypertrophic callus which was part with a 10 blade knife back to viable tissue. There is some mild erythema no cellulitis surrounding. Scant serous drainage.  Visit Diagnoses:  1. Status post amputation of toe of right foot (HCC)   2. Type 2 diabetes mellitus with foot ulcer, with long-term current use of insulin (HCC)     Plan: Follow-up in office in 2 weeks. We'll have him continue with IV antibiotics per infectious disease recommendations.  Follow-Up Instructions: Return in about 2 weeks (around 04/15/2017).   Imaging: No results found.  Orders:  No orders of the defined types were placed in this encounter.  No orders of the defined types were placed in this encounter.    PMFS History: Patient Active Problem List   Diagnosis Date Noted  . Medication monitoring encounter 03/28/2017  . Status post amputation of toe of right foot (HCC) 03/05/2017  . Subacute osteomyelitis, right ankle and foot (HCC)   . Peripheral neuropathy 02/16/2017  . Peripheral  artery disease (HCC) 02/16/2017  . 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 Diabetic foot infection (HCC) 02/14/2017  . Type II diabetes mellitus with renal manifestations (HCC)   . HLD (hyperlipidemia)   . Tobacco abuse   . CKD stage 2 due to type 2 diabetes mellitus (HCC)   . CAD (coronary artery disease)   . Seizure disorder [facility])    Past Medical History:  Diagnosis Date  . CAD (coronary artery disease)   . CKD (chronic kidney disease), stage III (HCC)   . 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 DVT (deep vein thrombosis) in pregnancy (HCC)   . HLD (hyperlipidemia)   . Seizure (HCC)   . Tobacco abuse     Family History  Problem Relation Age of Onset  . Diabetes Mellitus II Mother   . Diabetes Mellitus II Father     Past Surgical History:  Procedure Laterality Date  . AMPUTATION Right 02/23/2017   Procedure: RIGHT FOOT 5TH RAY AMPUTATION;  Surgeon: [name], MD;  Location: [facility] OR;  Service: Orthopedics;  Laterality: Right;  . FOOT SURGERY    . STENT PLACEMENT VASCULAR ([facility] HX)     Several in right leg   Social History   Occupational History  . Not on file.   Social History Main Topics  . Smoking  status: Current Some Day Smoker    Types: Cigarettes  . Smokeless tobacco: Never Used     Comment: pack or less a week  . Alcohol use Yes     Comment: seldom  . Drug use: No  . Sexual activity: Not on file

## 2017-04-02 ENCOUNTER — Telehealth: Payer: Self-pay | Admitting: *Deleted

## 2017-04-02 NOTE — Telephone Encounter (Signed)
-----   Message from Gardiner Barefoot, MD sent at 04/02/2017  8:47 AM EDT ----- Please let home health and the patient know that the xray was ok and at the end of the extra week this week we can stop the antibiotics and pull the picc line.  thanks

## 2017-04-02 NOTE — Telephone Encounter (Signed)
Share Dr. Ephriam Knuckles order for stopping IV ABX and Pull PIC.   Melissa, [facility] Pharmacist, stated that there is a stop date of 04/03/17 in their system and the patient is leaving for [place] after that time.  RN shared this information with Dr. Luciana Axe and he agreed that 04/03/17 coincided with his order.  Melissa, [facility] Pharmacist, will call [facility] nursing with the order.  RN shared Dr Ephriam Knuckles message with Mr. Sanchez.

## 2017-04-03 ENCOUNTER — Other Ambulatory Visit: Payer: Self-pay | Admitting: Pharmacist

## 2017-04-11 ENCOUNTER — Ambulatory Visit (INDEPENDENT_AMBULATORY_CARE_PROVIDER_SITE_OTHER): Payer: Managed Care, Other (non HMO) | Admitting: Internal Medicine

## 2017-04-11 ENCOUNTER — Encounter: Payer: Self-pay | Admitting: Internal Medicine

## 2017-04-11 DIAGNOSIS — E11628 Type 2 diabetes mellitus with other skin complications: Secondary | ICD-10-CM

## 2017-04-11 DIAGNOSIS — L089 Local infection of the skin and subcutaneous tissue, unspecified: Secondary | ICD-10-CM | POA: Diagnosis not present

## 2017-04-11 DIAGNOSIS — Z720 Tobacco use: Secondary | ICD-10-CM | POA: Diagnosis not present

## 2017-04-11 DIAGNOSIS — M86271 Subacute osteomyelitis, right ankle and foot: Secondary | ICD-10-CM

## 2017-04-11 NOTE — Assessment & Plan Note (Signed)
Resolved.  Now off antibiotics one week and doing well.  No further antibiotic treatment indicated.  rtc prn.

## 2017-04-11 NOTE — Assessment & Plan Note (Signed)
Encouraged to quit. 

## 2017-04-11 NOTE — Assessment & Plan Note (Signed)
He will continue wound care with Dr. Lajoyce Cornersuda.

## 2017-04-11 NOTE — Progress Notes (Signed)
   Subjective:    Patient ID: Curtis Bradford, male    DOB: [birth year], [age] y.o.   MRN: [id number]  HPI Here for follow up of osteomyelitis. Has been on vancomycin and ceftriaxone for 6 weeks for osteomyelitis of his second toe of the right foot.  I extended it last visit for an extra week with some increased swelling which has now improved.  I also checked an xray then and no new concerns found. He is doing well now and cleared by Dr. Lajoyce Cornersuda for walking.  Not significantly painful.  No fever, no warmth.  No serosanguinous drainage.      Review of Systems  Constitutional: Negative for chills and fatigue.  Gastrointestinal: Negative for diarrhea.  Skin: Negative for rash.  Neurological: Negative for dizziness.       Objective:   Physical Exam  Constitutional: He appears well-developed and well-nourished. No distress.  Eyes: No scleral icterus.  Cardiovascular: Normal rate, regular rhythm and normal heart sounds.   No murmur heard. Pulmonary/Chest: Effort normal and breath sounds normal. No respiratory distress.  Musculoskeletal:  Foot with minimal erythema around the incision but no warmth, light red.  Not infectious appearing. No drainage. No tenderness.     SH: + tobacco       Assessment & Plan:

## 2017-04-15 ENCOUNTER — Ambulatory Visit (INDEPENDENT_AMBULATORY_CARE_PROVIDER_SITE_OTHER): Payer: Managed Care, Other (non HMO) | Admitting: Orthopedic Surgery

## 2017-04-17 ENCOUNTER — Encounter (INDEPENDENT_AMBULATORY_CARE_PROVIDER_SITE_OTHER): Payer: Self-pay | Admitting: Orthopedic Surgery

## 2017-04-17 ENCOUNTER — Ambulatory Visit (INDEPENDENT_AMBULATORY_CARE_PROVIDER_SITE_OTHER): Payer: Managed Care, Other (non HMO) | Admitting: Orthopedic Surgery

## 2017-04-17 DIAGNOSIS — Z794 Long term (current) use of insulin: Secondary | ICD-10-CM

## 2017-04-17 DIAGNOSIS — E11621 Type 2 diabetes mellitus with foot ulcer: Secondary | ICD-10-CM

## 2017-04-17 DIAGNOSIS — L97509 Non-pressure chronic ulcer of other part of unspecified foot with unspecified severity: Secondary | ICD-10-CM

## 2017-04-17 DIAGNOSIS — Z89421 Acquired absence of other right toe(s): Secondary | ICD-10-CM

## 2017-04-17 NOTE — Progress Notes (Signed)
Post-Op Visit Note   Patient: Curtis Bradford           Date of Birth: 02-20-1961           MRN: [id number] Visit Date: 04/17/2017 PCP: Patient, No Pcp Per  Chief Complaint:  Chief Complaint  Patient presents with  . Right Foot - Routine Post Op    HPI:  HPI The patient is a 56 year old gentleman seen today status post right foot fifth ray amputation on September 1. His incision is been slow to heal.   He is also following with Dr. Luciana Axe from infectious diseases. His antibiotics have been discontinued.  Has return to work. Is full weightbearing in regular shoewear with custom orthotics. Doing Silvadene dressing changes daily. Has had recent stent placement in his lower leg, February of this year.  No fever. No chills.  Ortho Exam lateral incision healing slowly. There is a length that is 1.5 cm in length is 1 mm wide is 3 mm deep. this has granulation tissue in the wound bed. There is some surrounding hypertrophic callus which was pared with a 10 blade knife back to viable tissue. There is some mild erythema no cellulitis surrounding. Scant serous drainage.  Visit Diagnoses:  1. Status post amputation of toe of right foot (HCC)   2. Type 2 diabetes mellitus with foot ulcer, with long-term current use of insulin (HCC)     Plan: Follow-up in office in 3 weeks. We'll have him continue current daily wound cleansing and silvadene dressing changes. Encouraged him to limit his weight bearing.   Follow-Up Instructions: Return in about 3 weeks (around 05/08/2017).   Imaging: No results found.  Orders:  No orders of the defined types were placed in this encounter.  No orders of the defined types were placed in this encounter.    PMFS History: Patient Active Problem List   Diagnosis Date Noted  . Medication monitoring encounter 03/28/2017  . Status post amputation of toe of right foot (HCC) 03/05/2017  . Subacute osteomyelitis, right ankle and foot (HCC)   . Peripheral  neuropathy 02/16/2017  . Peripheral artery disease (HCC) 02/16/2017  . 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 Diabetic foot infection (HCC) 02/14/2017  . Type II diabetes mellitus with renal manifestations (HCC)   . HLD (hyperlipidemia)   . Tobacco abuse   . CKD stage 2 due to type 2 diabetes mellitus (HCC)   . CAD (coronary artery disease)   . Seizure disorder [facility])    Past Medical History:  Diagnosis Date  . CAD (coronary artery disease)   . CKD (chronic kidney disease), stage III (HCC)   . 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 DVT (deep vein thrombosis) in pregnancy (HCC)   . HLD (hyperlipidemia)   . Seizure (HCC)   . Tobacco abuse     Family History  Problem Relation Age of Onset  . Diabetes Mellitus II Mother   . Diabetes Mellitus II Father     Past Surgical History:  Procedure Laterality Date  . AMPUTATION Right 02/23/2017   Procedure: RIGHT FOOT 5TH RAY AMPUTATION;  Surgeon: [name], MD;  Location: [facility] OR;  Service: Orthopedics;  Laterality: Right;  . FOOT SURGERY    . STENT PLACEMENT VASCULAR ([facility] HX)     Several in right leg   Social History   Occupational History  . Not on file.   Social History Main Topics  .  Smoking status: Current Some Day Smoker    Types: Cigarettes  . Smokeless tobacco: Never Used     Comment: pack or less a week/ Occ  . Alcohol use No     Comment: seldom  . Drug use: No  . Sexual activity: Not on file

## 2017-05-08 ENCOUNTER — Ambulatory Visit (INDEPENDENT_AMBULATORY_CARE_PROVIDER_SITE_OTHER): Payer: Managed Care, Other (non HMO) | Admitting: Orthopedic Surgery

## 2017-10-30 ENCOUNTER — Emergency Department (HOSPITAL_COMMUNITY): Payer: Managed Care, Other (non HMO)

## 2017-10-30 ENCOUNTER — Encounter (HOSPITAL_COMMUNITY): Payer: Self-pay | Admitting: Emergency Medicine

## 2017-10-30 ENCOUNTER — Other Ambulatory Visit: Payer: Self-pay

## 2017-10-30 ENCOUNTER — Observation Stay (HOSPITAL_COMMUNITY)
Admission: EM | Admit: 2017-10-30 | Discharge: 2017-11-01 | Disposition: A | Discharge status: Home / Self Care | Payer: Managed Care, Other (non HMO) | Attending: Internal Medicine | Admitting: Internal Medicine

## 2017-10-30 DIAGNOSIS — Z79899 Other long term (current) drug therapy: Secondary | ICD-10-CM | POA: Diagnosis not present

## 2017-10-30 DIAGNOSIS — Z7902 Long term (current) use of antithrombotics/antiplatelets: Secondary | ICD-10-CM | POA: Diagnosis not present

## 2017-10-30 DIAGNOSIS — I63332 Cerebral infarction due to thrombosis of left posterior cerebral artery: Secondary | ICD-10-CM

## 2017-10-30 DIAGNOSIS — G459 Transient cerebral ischemic attack, unspecified: Secondary | ICD-10-CM | POA: Diagnosis not present

## 2017-10-30 DIAGNOSIS — I739 Peripheral vascular disease, unspecified: Secondary | ICD-10-CM | POA: Insufficient documentation

## 2017-10-30 DIAGNOSIS — I2510 Atherosclerotic heart disease of native coronary artery without angina pectoris: Secondary | ICD-10-CM | POA: Diagnosis present

## 2017-10-30 DIAGNOSIS — Z794 Long term (current) use of insulin: Secondary | ICD-10-CM | POA: Insufficient documentation

## 2017-10-30 DIAGNOSIS — G40909 Epilepsy, unspecified, not intractable, without status epilepticus: Secondary | ICD-10-CM | POA: Diagnosis not present

## 2017-10-30 DIAGNOSIS — M86271 Subacute osteomyelitis, right ankle and foot: Secondary | ICD-10-CM | POA: Diagnosis not present

## 2017-10-30 DIAGNOSIS — E1122 Type 2 diabetes mellitus with diabetic chronic kidney disease: Secondary | ICD-10-CM | POA: Diagnosis not present

## 2017-10-30 DIAGNOSIS — Z7982 Long term (current) use of aspirin: Secondary | ICD-10-CM | POA: Insufficient documentation

## 2017-10-30 DIAGNOSIS — F17210 Nicotine dependence, cigarettes, uncomplicated: Secondary | ICD-10-CM | POA: Diagnosis not present

## 2017-10-30 DIAGNOSIS — R202 Paresthesia of skin: Secondary | ICD-10-CM | POA: Diagnosis present

## 2017-10-30 DIAGNOSIS — N182 Chronic kidney disease, stage 2 (mild): Secondary | ICD-10-CM | POA: Diagnosis not present

## 2017-10-30 DIAGNOSIS — E1129 Type 2 diabetes mellitus with other diabetic kidney complication: Secondary | ICD-10-CM | POA: Diagnosis present

## 2017-10-30 HISTORY — DX: Cerebral infarction, unspecified: I63.9

## 2017-10-30 LAB — RAPID URINE DRUG SCREEN, HOSP PERFORMED
AMPHETAMINES: NOT DETECTED
Barbiturates: NOT DETECTED
Benzodiazepines: NOT DETECTED
Cocaine: NOT DETECTED
Opiates: NOT DETECTED
TETRAHYDROCANNABINOL: NOT DETECTED

## 2017-10-30 LAB — CBC
HEMATOCRIT: 44.3 % (ref 39.0–52.0)
HEMOGLOBIN: 15.1 g/dL (ref 13.0–17.0)
MCH: 31.1 pg (ref 26.0–34.0)
MCHC: 34.1 g/dL (ref 30.0–36.0)
MCV: 91.2 fL (ref 78.0–100.0)
Platelets: 302 10*3/uL (ref 150–400)
RBC: 4.86 MIL/uL (ref 4.22–5.81)
RDW: 13.9 % (ref 11.5–15.5)
WBC: 10.2 10*3/uL (ref 4.0–10.5)

## 2017-10-30 LAB — COMPREHENSIVE METABOLIC PANEL
ALBUMIN: 3.7 g/dL (ref 3.5–5.0)
ALK PHOS: 90 U/L (ref 38–126)
ALT: 25 U/L (ref 17–63)
AST: 19 U/L (ref 15–41)
Anion gap: 8 (ref 5–15)
BUN: 20 mg/dL (ref 6–20)
CALCIUM: 8.9 mg/dL (ref 8.9–10.3)
CO2: 23 mmol/L (ref 22–32)
CREATININE: 1.49 mg/dL — AB (ref 0.61–1.24)
Chloride: 108 mmol/L (ref 101–111)
GFR calc Af Amer: 59 mL/min — ABNORMAL LOW (ref >60)
GFR calc non Af Amer: 51 mL/min — ABNORMAL LOW (ref >60)
GLUCOSE: 156 mg/dL — AB (ref 65–99)
Potassium: 3.9 mmol/L (ref 3.5–5.1)
Sodium: 139 mmol/L (ref 135–145)
Total Bilirubin: 0.4 mg/dL (ref 0.3–1.2)
Total Protein: 6.3 g/dL — ABNORMAL LOW (ref 6.5–8.1)

## 2017-10-30 LAB — PROTIME-INR
INR: 0.93
Prothrombin Time: 12.4 seconds (ref 11.4–15.2)

## 2017-10-30 LAB — DIFFERENTIAL
BASOS ABS: 0 10*3/uL (ref 0.0–0.1)
Basophils Relative: 0 %
Eosinophils Absolute: 0.4 10*3/uL (ref 0.0–0.7)
Eosinophils Relative: 4 %
LYMPHS ABS: 2.6 10*3/uL (ref 0.7–4.0)
Lymphocytes Relative: 25 %
MONO ABS: 0.6 10*3/uL (ref 0.1–1.0)
MONOS PCT: 6 %
Neutro Abs: 6.6 10*3/uL (ref 1.7–7.7)
Neutrophils Relative %: 65 %

## 2017-10-30 LAB — URINALYSIS, ROUTINE W REFLEX MICROSCOPIC
Bacteria, UA: NONE SEEN
Bilirubin Urine: NEGATIVE
GLUCOSE, UA: 150 mg/dL — AB
Ketones, ur: NEGATIVE mg/dL
Leukocytes, UA: NEGATIVE
Nitrite: NEGATIVE
PH: 5 (ref 5.0–8.0)
Protein, ur: 30 mg/dL — AB
Specific Gravity, Urine: 1.019 (ref 1.005–1.030)

## 2017-10-30 LAB — I-STAT CHEM 8, ED
BUN: 22 mg/dL — ABNORMAL HIGH (ref 6–20)
CALCIUM ION: 1.13 mmol/L — AB (ref 1.15–1.40)
CREATININE: 1.4 mg/dL — AB (ref 0.61–1.24)
Chloride: 104 mmol/L (ref 101–111)
Glucose, Bld: 148 mg/dL — ABNORMAL HIGH (ref 65–99)
HCT: 44 % (ref 39.0–52.0)
HEMOGLOBIN: 15 g/dL (ref 13.0–17.0)
POTASSIUM: 3.8 mmol/L (ref 3.5–5.1)
Sodium: 140 mmol/L (ref 135–145)
TCO2: 22 mmol/L (ref 22–32)

## 2017-10-30 LAB — I-STAT TROPONIN, ED: Troponin i, poc: 0.01 ng/mL (ref 0.00–0.08)

## 2017-10-30 LAB — APTT: APTT: 34 s (ref 24–36)

## 2017-10-30 LAB — ETHANOL

## 2017-10-30 MED ORDER — INSULIN ASPART 100 UNIT/ML ~~LOC~~ SOLN
0.0000 [IU] | Freq: Three times a day (TID) | SUBCUTANEOUS | Status: DC
  Administered 2017-10-31 (×2): 2 [IU] via SUBCUTANEOUS
  Administered 2017-10-31: 1 [IU] via SUBCUTANEOUS
  Administered 2017-11-01: 5 [IU] via SUBCUTANEOUS
  Administered 2017-11-01: 1 [IU] via SUBCUTANEOUS
  Filled 2017-10-30 (×2): qty 1

## 2017-10-30 MED ORDER — ACETAMINOPHEN 160 MG/5ML PO SOLN: 650.0000 mg | ORAL | Status: DC | PRN

## 2017-10-30 MED ORDER — ACETAMINOPHEN 650 MG RE SUPP: 650.0000 mg | RECTAL | Status: DC | PRN

## 2017-10-30 MED ORDER — CLOPIDOGREL BISULFATE 75 MG PO TABS
75.0000 mg | ORAL_TABLET | Freq: Every day | ORAL | Status: DC
  Administered 2017-10-31 (×2): 75 mg via ORAL
  Filled 2017-10-30 (×2): qty 1

## 2017-10-30 MED ORDER — ATORVASTATIN CALCIUM 20 MG PO TABS
20.0000 mg | ORAL_TABLET | Freq: Every day | ORAL | Status: DC
Start: 2017-10-31 — End: 2017-10-31
  Administered 2017-10-31: 20 mg via ORAL
  Filled 2017-10-30: qty 1

## 2017-10-30 MED ORDER — ASPIRIN EC 81 MG PO TBEC
81.0000 mg | DELAYED_RELEASE_TABLET | Freq: Every day | ORAL | Status: DC
  Administered 2017-10-31 (×2): 81 mg via ORAL
  Filled 2017-10-30 (×2): qty 1

## 2017-10-30 MED ORDER — LINAGLIPTIN 5 MG PO TABS
5.0000 mg | ORAL_TABLET | Freq: Every day | ORAL | Status: DC
  Administered 2017-10-31 – 2017-11-01 (×2): 5 mg via ORAL
  Filled 2017-10-30 (×2): qty 1

## 2017-10-30 MED ORDER — ACETAMINOPHEN 325 MG PO TABS: 650.0000 mg | ORAL_TABLET | ORAL | Status: DC | PRN

## 2017-10-30 MED ORDER — ENOXAPARIN SODIUM 40 MG/0.4ML ~~LOC~~ SOLN
40.0000 mg | SUBCUTANEOUS | Status: DC
  Administered 2017-10-31 – 2017-11-01 (×2): 40 mg via SUBCUTANEOUS
  Filled 2017-10-30 (×2): qty 0.4

## 2017-10-30 MED ORDER — SODIUM CHLORIDE 0.9 % IV SOLN
INTRAVENOUS | Status: AC
  Administered 2017-10-31: 16:00:00 via INTRAVENOUS

## 2017-10-30 MED ORDER — STROKE: EARLY STAGES OF RECOVERY BOOK
Freq: Once | Status: DC
  Filled 2017-10-30: qty 1

## 2017-10-30 MED ORDER — LEVETIRACETAM 500 MG PO TABS
1000.0000 mg | ORAL_TABLET | Freq: Every day | ORAL | Status: DC
  Administered 2017-10-31 (×2): 1000 mg via ORAL
  Filled 2017-10-30 (×2): qty 2

## 2017-10-30 MED ORDER — INSULIN GLARGINE 100 UNITS/ML SOLOSTAR PEN
37.0000 [IU] | PEN_INJECTOR | Freq: Every day | SUBCUTANEOUS | Status: DC
  Filled 2017-10-30: qty 3

## 2017-10-30 NOTE — ED Provider Notes (Signed)
[facility] EMERGENCY DEPARTMENT Provider Note   CSN: [id number] Arrival date & time: 10/30/17  2110     History   Chief Complaint Chief Complaint  Patient presents with  . Numbness    HPI Curtis Bradford is a 57 y.o. male.  57 year old male with prior history of CAD, CKD, diabetes, and CVA presents with complaint of right arm and right facial numbness and tingling.  Patient reports that his symptoms started 4 days prior.  He first noticed numbness and tingling in the right arm.  Over the last 36 hours his progress of the right face.  He denies any associated weakness.  He denies associated chest pain, shortness of breath, nausea, vomiting, speech change, visual changes, or other acute complaint.  The history is provided by the patient.  Neurologic Problem  The current episode started more than 2 days ago. The problem occurs rarely. The problem has not changed since onset.Pertinent negatives include no chest pain and no abdominal pain. Nothing aggravates the symptoms. Nothing relieves the symptoms. He has tried nothing for the symptoms. The treatment provided no relief.    Past Medical History:  Diagnosis Date  . CAD (coronary artery disease)   . CKD (chronic kidney disease), stage III (HCC)   . 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 DVT (deep vein thrombosis) in pregnancy (HCC)   . HLD (hyperlipidemia)   . Seizure (HCC)   . Tobacco abuse     Patient Active Problem List   Diagnosis Date Noted  . Medication monitoring encounter 03/28/2017  . Status post amputation of toe of right foot (HCC) 03/05/2017  . Subacute osteomyelitis, right ankle and foot (HCC)   . Peripheral neuropathy 02/16/2017  . Peripheral artery disease (HCC) 02/16/2017  . 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 Diabetic foot infection (HCC) 02/14/2017  . Type II diabetes mellitus with renal manifestations (HCC)   . HLD (hyperlipidemia)   .  Tobacco abuse   . CKD stage 2 due to type 2 diabetes mellitus (HCC)   . CAD (coronary artery disease)   . Seizure disorder [facility])     Past Surgical History:  Procedure Laterality Date  . AMPUTATION Right 02/23/2017   Procedure: RIGHT FOOT 5TH RAY AMPUTATION;  Surgeon: [name], MD;  Location: [facility] OR;  Service: Orthopedics;  Laterality: Right;  . FOOT SURGERY    . STENT PLACEMENT VASCULAR ([facility] HX)     Several in right leg        Home Medications    Prior to Admission medications   Medication Sig Start Date End Date Taking? Authorizing Provider  aspirin EC 81 MG tablet Take 81 mg by mouth at bedtime.   Yes [provider]  atorvastatin (LIPITOR) 20 MG tablet Take 20 mg by mouth at bedtime.   Yes [provider]  clopidogrel (PLAVIX) 75 MG tablet Take 75 mg by mouth at bedtime.   Yes [provider]  insulin glargine (LANTUS) 100 unit/mL SOPN Inject 37 Units into the skin at bedtime.   Yes [provider]  Insulin Lispro (HUMALOG KWIKPEN [place]) Inject 0-15 Units into the skin 2 (two) times daily. Per sliding scale   Yes [provider]  levETIRAcetam (KEPPRA) 1000 MG tablet Take 1,000 mg by mouth at bedtime.   Yes [provider]  linagliptin (TRADJENTA) 5 MG TABS tablet Take 5 mg by mouth daily.   Yes [provider]  silver sulfADIAZINE (SILVADENE) 1 % cream Apply 1 application topically daily. 04/01/17  Yes Barnie Del R, NP  oxyCODONE (OXY IR/ROXICODONE) 5 MG immediate release tablet Take 1 tablet (5 mg total) by mouth every 6 (six) hours as needed for moderate pain or severe pain. Patient not taking: Reported on 10/30/2017 02/26/17   Tyrone Nine, MD    Family History Family History  Problem Relation Age of Onset  . Diabetes Mellitus II Mother   . Diabetes Mellitus II Father     Social History Social History   Tobacco Use  . Smoking status: Current Some Day Smoker    Types: Cigarettes  . Smokeless tobacco:  Never Used  . Tobacco comment: pack or less a week/ Occ  Substance Use Topics  . Alcohol use: No    Comment: seldom  . Drug use: No     Allergies   Patient has no known allergies.   Review of Systems Review of Systems  Cardiovascular: Negative for chest pain.  Gastrointestinal: Negative for abdominal pain.  Neurological: Positive for numbness.  All other systems reviewed and are negative.    Physical Exam Updated Vital Signs BP (!) 151/90 (BP Location: Right Arm)   Pulse 87   Temp 98.2 F (36.8 C) (Oral)   Resp 14   Ht  (1.905 m)   Wt 90.7 kg (200 lb)   SpO2 100%   BMI 25.00 kg/m   Physical Exam  Constitutional: He is oriented to person, place, and time. He appears well-developed and well-nourished. No distress.  HENT:  Head: Normocephalic and atraumatic.  Mouth/Throat: Oropharynx is clear and moist.  Eyes: Pupils are equal, round, and reactive to light. Conjunctivae and EOM are normal.  Neck: Normal range of motion. Neck supple.  Cardiovascular: Normal rate, regular rhythm and normal heart sounds.  Pulmonary/Chest: Effort normal and breath sounds normal. No respiratory distress.  Abdominal: Soft. He exhibits no distension. There is no tenderness.  Musculoskeletal: Normal range of motion. He exhibits no edema or deformity.  Neurological: He is alert and oriented to person, place, and time. No cranial nerve deficit. He exhibits normal muscle tone. Coordination normal.  Alert and oriented No facial droop Normal speech 5 out of 5 strength in all 4 extremities  VAN negative   Skin: Skin is warm and dry.  Psychiatric: He has a normal mood and affect.  Nursing note and vitals reviewed.    ED Treatments / Results  Labs (all labs ordered are listed, but only abnormal results are displayed) Labs Reviewed  COMPREHENSIVE METABOLIC PANEL - Abnormal; Notable for the following components:      Result Value   Glucose, Bld 156 (*)    Creatinine, Ser 1.49 (*)     Total Protein 6.3 (*)    GFR calc non Af Amer 51 (*)    GFR calc Af Amer 59 (*)    All other components within normal limits  URINALYSIS, ROUTINE W REFLEX MICROSCOPIC - Abnormal; Notable for the following components:   Glucose, UA 150 (*)    Hgb urine dipstick SMALL (*)    Protein, ur 30 (*)    All other components within normal limits  I-STAT CHEM 8, ED - Abnormal; Notable for the following components:   BUN 22 (*)    Creatinine, Ser 1.40 (*)    Glucose, Bld 148 (*)    Calcium, Ion 1.13 (*)    All other components within normal limits  ETHANOL  PROTIME-INR  APTT  CBC  DIFFERENTIAL  RAPID URINE DRUG SCREEN, HOSP PERFORMED  I-STAT TROPONIN, ED    EKG EKG Interpretation  Date/Time:  Wednesday Oct 30 2017 21:27:38 EDT Ventricular Rate:  89 PR Interval:  166 QRS Duration: 98 QT Interval:  378 QTC Calculation: 459 R Axis:   -29 Text Interpretation:  Normal sinus rhythm Incomplete right bundle branch block Left ventricular hypertrophy Anteroseptal infarct , age undetermined T wave abnormality, consider lateral ischemia Abnormal ECG Confirmed by Kristine Royal [id number]) on 10/30/2017 9:53:48 PM   Radiology Ct Head Wo Contrast  Result Date: 10/30/2017 CLINICAL DATA:  Right-sided face and hand numbness for 3 days. EXAM: CT HEAD WITHOUT CONTRAST TECHNIQUE: Contiguous axial images were obtained from the base of the skull through the vertex without intravenous contrast. COMPARISON:  None. FINDINGS: Brain: No mass lesion, intraparenchymal hemorrhage or extra-axial collection. No evidence of acute cortical infarct. Old left parietal lobe infarct and left thalamus lacunar infarct. Vascular: No hyperdense vessel or unexpected vascular calcification. Skull: Normal visualized skull base, calvarium and extracranial soft tissues. Sinuses/Orbits: No sinus fluid levels or advanced mucosal thickening. No mastoid effusion. Normal orbits. 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 Electronically Signed   By: Deatra Robinson M.D.   On: 10/30/2017 22:17    Procedures Procedures (including critical care time)  Medications Ordered in ED Medications - No data to display   Initial Impression / Assessment and Plan / ED Course  I have reviewed the triage vital signs and the nursing notes.  Pertinent labs & imaging results that were available during my care of the patient were reviewed by me and considered in my medical decision making (see chart for details).     MDM  Screen Complete  [name] presenting for evaluation of reported right arm and right facial paresthesias.  Symptoms started 4 days prior.  Head CT does not show any acute process.  Screening labs obtained in the ED ulcer without significant abnormality.  However, given prior history of CVA and the possibility that the patient's current symptoms are reflective of a new CVA will admit for further work-up.  Hospitalist service is aware of the case and will evaluate for admission.   Final Clinical Impressions(s) / ED Diagnoses   Final diagnoses:  Paresthesia    ED Discharge Orders    None       Wynetta Fines, MD 10/30/17 2356

## 2017-10-30 NOTE — ED Notes (Signed)
This RN attempted IV stick x 1.  

## 2017-10-30 NOTE — ED Notes (Signed)
Patient transported to MRI 

## 2017-10-30 NOTE — ED Triage Notes (Signed)
Patient here with right arm numbness and right facial numbness.  Patient states that it has been 2-3 days, goes and comes back, woke up today that got worse today around 5am, went to be and states that he is having trouble holding things and dropping them.  He has equal hand grips, equal pedal pushes no slurred speech and no facial droop.  [name] a diabetic.

## 2017-10-30 NOTE — H&P (Signed)
History and Physical    Curtis Bradford WUJ:[id number] DOB: 11/13/1960 DOA: 10/30/2017  PCP: Patient, No Pcp Per  Patient coming from: Home.  Chief Complaint: Right facial and right upper extremity numbness.  HPI: Curtis Bradford is a 57 y.o. male with history of CAD status post PCI, peripheral vascular disease, chronic right great toe infection, diabetes mellitus type 2, tobacco abuse, chronic kidney disease presents to the ER because of persistent right upper extremity and right facial numbness.  Patient has been having the symptoms for last 4 days.  Has become more worse tonight with patient stating that he almost felt like he is losing his grip strength on his right hand.  Denies any difficulty speaking swallowing or visual symptoms or any weakness of the left upper or lower extremity or right leg.  ED Course: In the ER patient had good strength of all extremities.  No facial asymmetry.  CT head was done which as per the neurologist showed some subtotal change in the thalamic area and advised to admit for further management of stroke.  Review of Systems: As per HPI, rest all negative.   Past Medical History:  Diagnosis Date  . CAD (coronary artery disease)   . CKD (chronic kidney disease), stage III (HCC)   . 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 DVT (deep vein thrombosis) in pregnancy (HCC)   . HLD (hyperlipidemia)   . Seizure (HCC)   . Tobacco abuse     Past Surgical History:  Procedure Laterality Date  . AMPUTATION Right 02/23/2017   Procedure: RIGHT FOOT 5TH RAY AMPUTATION;  Surgeon: [name], MD;  Location: [facility] OR;  Service: Orthopedics;  Laterality: Right;  . FOOT SURGERY    . STENT PLACEMENT VASCULAR ([facility] HX)     Several in right leg     reports that he has been smoking cigarettes.  He has never used smokeless tobacco. He reports that he does not drink alcohol or use drugs.  No Known Allergies  Family History  Problem Relation Age of Onset  .  Diabetes Mellitus II Mother   . Diabetes Mellitus II Father     Prior to Admission medications   Medication Sig Start Date End Date Taking? Authorizing Provider  aspirin EC 81 MG tablet Take 81 mg by mouth at bedtime.   Yes [provider]  atorvastatin (LIPITOR) 20 MG tablet Take 20 mg by mouth at bedtime.   Yes [provider]  clopidogrel (PLAVIX) 75 MG tablet Take 75 mg by mouth at bedtime.   Yes [provider]  insulin glargine (LANTUS) 100 unit/mL SOPN Inject 37 Units into the skin at bedtime.   Yes [provider]  Insulin Lispro (HUMALOG KWIKPEN [place]) Inject 0-15 Units into the skin 2 (two) times daily. Per sliding scale   Yes [provider]  levETIRAcetam (KEPPRA) 1000 MG tablet Take 1,000 mg by mouth at bedtime.   Yes [provider]  linagliptin (TRADJENTA) 5 MG TABS tablet Take 5 mg by mouth daily.   Yes [provider]  silver sulfADIAZINE (SILVADENE) 1 % cream Apply 1 application topically daily. 04/01/17  Yes Barnie Del R, NP  oxyCODONE (OXY IR/ROXICODONE) 5 MG immediate release tablet Take 1 tablet (5 mg total) by mouth every 6 (six) hours as needed for moderate pain or severe pain. Patient not taking: Reported on 10/30/2017 02/26/17   Tyrone Nine, MD    Physical Exam: Vitals:  10/30/17 2117  BP: (!) 151/90  Pulse: 87  Resp: 14  Temp: 98.2 F (36.8 C)  TempSrc: Oral  SpO2: 100%  Weight: 90.7 kg (200 lb)  Height:  (1.905 m)      Constitutional: Moderately built and nourished. Vitals:   10/30/17 2117  BP: (!) 151/90  Pulse: 87  Resp: 14  Temp: 98.2 F (36.8 C)  TempSrc: Oral  SpO2: 100%  Weight: 90.7 kg (200 lb)  Height:  (1.905 m)   Eyes: Anicteric no pallor. ENMT: No discharge from the ears eyes nose or mouth. Neck: No mass felt.  No neck rigidity.  No JVD appreciated. Respiratory: No rhonchi or crepitations. Cardiovascular: S1-S2 heard no murmurs appreciated. Abdomen: Soft  nontender bowel sounds present. Musculoskeletal: Right foot great toe appears discolored and infected with ulceration no active discharge. Skin: Right foot great toe looks infected with ulceration no discharge. Neurologic: Alert awake oriented to time place and person.  Moves all extremities 5 x 5.  No facial symmetry no pronator drift.  Tongue is midline.  Pupils equal and reactive to light. Psychiatric: Appears normal.  Normal affect.   Labs on Admission: I have personally reviewed following labs and imaging studies  CBC: Recent Labs  Lab 10/30/17 2143 10/30/17 2150  WBC 10.2  --   NEUTROABS 6.6  --   HGB 15.1 15.0  HCT 44.3 44.0  MCV 91.2  --   PLT 302  --    Basic Metabolic Panel: Recent Labs  Lab 10/30/17 2143 10/30/17 2150  NA 139 140  K 3.9 3.8  CL 108 104  CO2 23  --   GLUCOSE 156* 148*  BUN 20 22*  CREATININE 1.49* 1.40*  CALCIUM 8.9  --    GFR: Estimated Creatinine Clearance: 71.3 mL/min (A) (by C-G formula based on SCr of 1.4 mg/dL (H)). Liver Function Tests: Recent Labs  Lab 10/30/17 2143  AST 19  ALT 25  ALKPHOS 90  BILITOT 0.4  PROT 6.3*  ALBUMIN 3.7   No results for input(s): LIPASE, AMYLASE in the last 168 hours. No results for input(s): AMMONIA in the last 168 hours. Coagulation Profile: Recent Labs  Lab 10/30/17 2143  INR 0.93   Cardiac Enzymes: No results for input(s): CKTOTAL, CKMB, CKMBINDEX, TROPONINI in the last 168 hours. BNP (last 3 results) No results for input(s): PROBNP in the last 8760 hours. HbA1C: No results for input(s): HGBA1C in the last 72 hours. CBG: No results for input(s): GLUCAP in the last 168 hours. Lipid Profile: No results for input(s): CHOL, HDL, LDLCALC, TRIG, CHOLHDL, LDLDIRECT in the last 72 hours. Thyroid Function Tests: No results for input(s): TSH, T4TOTAL, FREET4, T3FREE, THYROIDAB in the last 72 hours. Anemia Panel: No results for input(s): VITAMINB12, FOLATE, FERRITIN, TIBC, IRON, RETICCTPCT in  the last 72 hours. Urine analysis:    Component Value Date/Time   COLORURINE YELLOW 10/30/2017 2128   APPEARANCEUR CLEAR 10/30/2017 2128   LABSPEC 1.019 10/30/2017 2128   PHURINE 5.0 10/30/2017 2128   GLUCOSEU 150 (A) 10/30/2017 2128   HGBUR SMALL (A) 10/30/2017 2128   BILIRUBINUR NEGATIVE 10/30/2017 2128   KETONESUR NEGATIVE 10/30/2017 2128   PROTEINUR 30 (A) 10/30/2017 2128   NITRITE NEGATIVE 10/30/2017 2128   LEUKOCYTESUR NEGATIVE 10/30/2017 2128   Sepsis Labs: (procalcitonin:4,lacticidven:4) )No results found for this or any previous visit (from the past 240 hour(s)).   Radiological Exams on Admission: Ct Head Wo Contrast  Result Date: 10/30/2017 CLINICAL DATA:  Right-sided  face and hand numbness for 3 days. EXAM: CT HEAD WITHOUT CONTRAST TECHNIQUE: Contiguous axial images were obtained from the base of the skull through the vertex without intravenous contrast. COMPARISON:  None. FINDINGS: Brain: No mass lesion, intraparenchymal hemorrhage or extra-axial collection. No evidence of acute cortical infarct. Old left parietal lobe infarct and left thalamus lacunar infarct. Vascular: No hyperdense vessel or unexpected vascular calcification. Skull: Normal visualized skull base, calvarium and extracranial soft tissues. Sinuses/Orbits: No sinus fluid levels or advanced mucosal thickening. No mastoid effusion. Normal orbits. 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 Electronically Signed   By: Deatra Robinson M.D.   On: 10/30/2017 22:17    EKG: Independently reviewed.  Normal sinus rhythm with RBBB and changes in the anterior leads.  Denies any chest pain.  Assessment/Plan Principal Problem:   TIA (transient ischemic attack) Active Problems:   Type II diabetes mellitus with renal manifestations (HCC)   CKD stage 2 due to type 2 diabetes mellitus (HCC)   CAD (coronary artery disease)   Seizure disorder (HCC)   Peripheral artery  disease (HCC)   Subacute osteomyelitis, right ankle and foot (HCC)    1. TIA versus stroke -discussed with neurologist.  [name] on aspirin Plavix statins.  MRI brain/MRA brain 2D echo carotid Dopplers have been ordered.  Patient has passed swallow.  Check hemoglobin A1c lipid panel. 2. Diabetes mellitus type 2 on long-acting insulin Lantus along with sliding coverage.  Check hemoglobin A1c. 3. History of seizures on Keppra. 4. Chronic kidney disease stage II follow metabolic panel. 5. Chronic right great toe infection.  Does not have any active discharge patient uses Silvadene cream. 6. History of CAD status post stenting on statins aspirin Plavix which will be continued. 7. Hypertension -allow for permissive hypertension. 8. Peripheral vascular disease with chronic right foot great toe infection.   DVT prophylaxis: Lovenox. Code Status: Full code. Family Communication: Discussed with patient. Disposition Plan: Home. Consults called: Neurology. Admission status: Observation.   Eduard Clos MD [facility] Pager [phone].  If 7PM-7AM, please contact night-coverage [email or web address] Password [facility]  10/30/2017, 11:43 PM

## 2017-10-31 ENCOUNTER — Other Ambulatory Visit: Payer: Self-pay

## 2017-10-31 ENCOUNTER — Encounter (HOSPITAL_COMMUNITY): Payer: Self-pay | Admitting: General Practice

## 2017-10-31 ENCOUNTER — Observation Stay (HOSPITAL_COMMUNITY): Payer: Managed Care, Other (non HMO)

## 2017-10-31 DIAGNOSIS — I2510 Atherosclerotic heart disease of native coronary artery without angina pectoris: Secondary | ICD-10-CM

## 2017-10-31 DIAGNOSIS — G459 Transient cerebral ischemic attack, unspecified: Secondary | ICD-10-CM | POA: Diagnosis not present

## 2017-10-31 DIAGNOSIS — N182 Chronic kidney disease, stage 2 (mild): Secondary | ICD-10-CM

## 2017-10-31 DIAGNOSIS — Z8673 Personal history of transient ischemic attack (TIA), and cerebral infarction without residual deficits: Secondary | ICD-10-CM

## 2017-10-31 DIAGNOSIS — E1122 Type 2 diabetes mellitus with diabetic chronic kidney disease: Secondary | ICD-10-CM

## 2017-10-31 DIAGNOSIS — Z794 Long term (current) use of insulin: Secondary | ICD-10-CM

## 2017-10-31 DIAGNOSIS — G40909 Epilepsy, unspecified, not intractable, without status epilepticus: Secondary | ICD-10-CM

## 2017-10-31 DIAGNOSIS — I63332 Cerebral infarction due to thrombosis of left posterior cerebral artery: Secondary | ICD-10-CM

## 2017-10-31 DIAGNOSIS — I739 Peripheral vascular disease, unspecified: Secondary | ICD-10-CM

## 2017-10-31 LAB — ECHOCARDIOGRAM COMPLETE
HEIGHTINCHES: 75 in
WEIGHTICAEL: 3200 [oz_av]

## 2017-10-31 LAB — CBC
HCT: 40 % (ref 39.0–52.0)
HEMOGLOBIN: 13.8 g/dL (ref 13.0–17.0)
MCH: 31.4 pg (ref 26.0–34.0)
MCHC: 34.5 g/dL (ref 30.0–36.0)
MCV: 91.1 fL (ref 78.0–100.0)
Platelets: 261 10*3/uL (ref 150–400)
RBC: 4.39 MIL/uL (ref 4.22–5.81)
RDW: 13.9 % (ref 11.5–15.5)
WBC: 10.4 10*3/uL (ref 4.0–10.5)

## 2017-10-31 LAB — HEMOGLOBIN A1C
HEMOGLOBIN A1C: 9.9 % — AB (ref 4.8–5.6)
MEAN PLASMA GLUCOSE: 237.43 mg/dL

## 2017-10-31 LAB — TROPONIN I: Troponin I: <0.03 ng/mL (ref <0.03)

## 2017-10-31 LAB — CREATININE, SERUM
CREATININE: 1.36 mg/dL — AB (ref 0.61–1.24)
GFR calc Af Amer: >60 mL/min (ref >60)
GFR calc non Af Amer: 57 mL/min — ABNORMAL LOW (ref >60)

## 2017-10-31 LAB — LIPID PANEL
Cholesterol: 97 mg/dL (ref 0–200)
HDL: 21 mg/dL — AB (ref >40)
LDL Cholesterol: 38 mg/dL (ref 0–99)
Total CHOL/HDL Ratio: 4.6 RATIO
Triglycerides: 189 mg/dL — ABNORMAL HIGH (ref <150)
VLDL: 38 mg/dL (ref 0–40)

## 2017-10-31 LAB — GLUCOSE, CAPILLARY
GLUCOSE-CAPILLARY: 165 mg/dL — AB (ref 65–99)
Glucose-Capillary: 229 mg/dL — ABNORMAL HIGH (ref 65–99)

## 2017-10-31 LAB — CBG MONITORING, ED
GLUCOSE-CAPILLARY: 143 mg/dL — AB (ref 65–99)
Glucose-Capillary: 187 mg/dL — ABNORMAL HIGH (ref 65–99)

## 2017-10-31 MED ORDER — INSULIN GLARGINE 100 UNIT/ML ~~LOC~~ SOLN
37.0000 [IU] | Freq: Every day | SUBCUTANEOUS | Status: DC
  Administered 2017-10-31 – 2017-11-01 (×2): 37 [IU] via SUBCUTANEOUS
  Filled 2017-10-31 (×2): qty 0.37

## 2017-10-31 MED ORDER — INSULIN GLARGINE 100 UNIT/ML ~~LOC~~ SOLN
37.0000 [IU] | Freq: Every day | SUBCUTANEOUS | Status: DC
  Filled 2017-10-31: qty 0.37

## 2017-10-31 MED ORDER — IOPAMIDOL (ISOVUE-370) INJECTION 76%
INTRAVENOUS | Status: AC
  Filled 2017-10-31: qty 50

## 2017-10-31 MED ORDER — ATORVASTATIN CALCIUM 80 MG PO TABS
80.0000 mg | ORAL_TABLET | Freq: Every day | ORAL | Status: DC
  Administered 2017-10-31: 80 mg via ORAL
  Filled 2017-10-31: qty 1

## 2017-10-31 MED ORDER — SILVER SULFADIAZINE 1 % EX CREA
TOPICAL_CREAM | Freq: Every day | CUTANEOUS | Status: DC
  Administered 2017-10-31 – 2017-11-01 (×2): via TOPICAL
  Filled 2017-10-31: qty 85

## 2017-10-31 MED ORDER — IOPAMIDOL (ISOVUE-370) INJECTION 76%
50.0000 mL | Freq: Once | INTRAVENOUS | Status: AC | PRN
  Administered 2017-10-31: 50 mL via INTRAVENOUS

## 2017-10-31 NOTE — Progress Notes (Signed)
  Echocardiogram 2D Echocardiogram has been performed.  Curtis Bradford Curtis Bradford 10/31/2017, 2:14 PM

## 2017-10-31 NOTE — Progress Notes (Addendum)
STROKE TEAM PROGRESS NOTE   SUBJECTIVE (INTERVAL HISTORY) [name] taking a nap in the bed.  Awakens easily.  States right arm numbness and tingling is the same.    Patient reports history of stroke in 2012 with expressive and receptive aphasia as well as vision difficulty.  States it took quite a long while for his speech to return.  He was told etiology of stroke was related to MI w/ stent placement he had in 2009. He lived in [place] at the time.   He also has a history of R leg DVT 5 years ago.  He states he was found to have a "blood clot in his leg with his muscle tightening up around it".  He eventually received 2 stents that were subsequently clogged, unclogged, clogged then an additional 3 stents placed/replaced. PVD and likely poorly controlled diabetes let to R foot wound leading to eventual surgery and amputation of R 5th toe.   Patient states he works third shift 5 days a week.  Due to this schedule, he has been having difficulty taking his excellent on any type of schedule.  Often when he transitions, he will skip insulin.  OBJECTIVE Vitals:   10/31/17 0700 10/31/17 0751 10/31/17 0800 10/31/17 0815  BP: (!) 141/88  (!) 146/78   Pulse: 66  63 61  Resp: 15  15 11   Temp:      TempSrc:      SpO2: 96% 96% 97% 97%  Weight:      Height:        CBC:  Recent Labs  Lab 10/30/17 2143 10/30/17 2150 10/31/17 0045  WBC 10.2  --  10.4  NEUTROABS 6.6  --   --   HGB 15.1 15.0 13.8  HCT 44.3 44.0 40.0  MCV 91.2  --  91.1  PLT 302  --  261    Basic Metabolic Panel:  Recent Labs  Lab 10/30/17 2143 10/30/17 2150 10/31/17 0045  NA 139 140  --   K 3.9 3.8  --   CL 108 104  --   CO2 23  --   --   GLUCOSE 156* 148*  --   BUN 20 22*  --   CREATININE 1.49* 1.40* 1.36*  CALCIUM 8.9  --   --     Lipid Panel:     Component Value Date/Time   CHOL 97 10/31/2017 0356   TRIG 189 (H) 10/31/2017 0356   HDL 21 (L) 10/31/2017 0356   CHOLHDL 4.6 10/31/2017 0356   VLDL 38 10/31/2017  0356   LDLCALC 38 10/31/2017 0356   HgbA1c:  Lab Results  Component Value Date   HGBA1C 9.9 (H) 10/31/2017   Urine Drug Screen:     Component Value Date/Time   LABOPIA NONE DETECTED 10/30/2017 2128   COCAINSCRNUR NONE DETECTED 10/30/2017 2128   LABBENZ NONE DETECTED 10/30/2017 2128   AMPHETMU NONE DETECTED 10/30/2017 2128   THCU NONE DETECTED 10/30/2017 2128   LABBARB NONE DETECTED 10/30/2017 2128    Alcohol Level     Component Value Date/Time   ETH <10 10/30/2017 2143    IMAGING  Ct Head Wo Contrast  Result Date: 10/30/2017 CLINICAL DATA:  Right-sided face and hand numbness for 3 days. EXAM: CT HEAD WITHOUT CONTRAST TECHNIQUE: Contiguous axial images were obtained from the base of the skull through the vertex without intravenous contrast. COMPARISON:  None. FINDINGS: Brain: No mass lesion, intraparenchymal hemorrhage or extra-axial collection. No evidence of acute cortical infarct. Old  left parietal lobe infarct and left thalamus lacunar infarct. Vascular: No hyperdense vessel or unexpected vascular calcification. Skull: Normal visualized skull base, calvarium and extracranial soft tissues. Sinuses/Orbits: No sinus fluid levels or advanced mucosal thickening. No mastoid effusion. Normal orbits. 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 Electronically Signed   By: Deatra Robinson M.D.   On: 10/30/2017 22:17   Ct Angio Neck W Or Wo Contrast  Result Date: 10/31/2017 CLINICAL DATA:  Stroke follow-up EXAM: CT ANGIOGRAPHY NECK TECHNIQUE: Multidetector CT imaging of the neck was performed using the standard protocol during bolus administration of intravenous contrast. Multiplanar CT image reconstructions and MIPs were obtained to evaluate the vascular anatomy. Carotid stenosis measurements (when applicable) are obtained utilizing NASCET criteria, using the distal internal carotid diameter as the denominator. CONTRAST:  50mL ISOVUE-370 IOPAMIDOL  (ISOVUE-370) INJECTION 76% COMPARISON:  Brain MRI and MRA from earlier today atherosclerotic calcification. Two vessel branching. FINDINGS: Aortic arch: Noncalcified plaque along the brachiocephalic without flow limiting stenosis or ulceration. Right carotid system: Mild mixed density plaque at the common carotid bifurcation and ICA bulb without stenosis or ulceration. Left carotid system: Mild atheromatous plaque at the common carotid origin and bifurcation. No stenosis or ulceration. Negative for beading. Vertebral arteries: Proximal subclavian primarily noncalcified atherosclerosis on the left with 40% stenosis. Mild left vertebral artery dominance. The vertebral arteries are smooth and diffusely patent to the dura. Skeleton: Diffuse cervical spine degeneration with mild dextrocurvature. No acute finding. Other neck: Negative Upper chest: Negative IMPRESSION: 1. Atherosclerosis without flow limiting stenosis or ulceration. 2. 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 Electronically Signed   By: Marnee Spring M.D.   On: 10/31/2017 10:25   Mr Brain Wo Contrast  Result Date: 10/31/2017 CLINICAL DATA:  Initial evaluation for right arm and facial numbness with tingling. EXAM: MRI HEAD WITHOUT CONTRAST MRA HEAD WITHOUT CONTRAST TECHNIQUE: Multiplanar, multiecho pulse sequences of the brain and surrounding structures were obtained without intravenous contrast. Angiographic images of the head were obtained using MRA technique without contrast. COMPARISON:  Prior CT from 10/30/2017. FINDINGS: MRI HEAD FINDINGS Brain: Generalized age-related cerebral atrophy. Mild chronic small vessel ischemic change present within the periventricular white matter. Remote posterior left MCA territory infarct with associated encephalomalacia and gliosis within the left parietal region. Associated chronic hemosiderin staining. Scattered remote lacunar infarcts present within the bilateral thalami as well as the right basal ganglia. 9 mm  acute ischemic left thalamic lacunar infarct without associated hemorrhage or mass effect (series 3, image 26). No other evidence for acute or subacute ischemia. Gray-white matter differentiation otherwise maintained. No evidence for acute intracranial hemorrhage. No mass lesion, midline shift or mass effect. No extra-axial fluid collection. No hydrocephalus. Mild ex vacuo dilatation of the lateral ventricles bilaterally related to remote ischemic changes. Pituitary gland suprasellar region normal. Midline structures intact and normal. Vascular: Major intracranial vascular flow voids are maintained. Skull and upper cervical spine: Craniocervical junction normal. Upper cervical spine within normal limits. Bone marrow signal intensity normal. No scalp soft tissue abnormality. Sinuses/Orbits: Unremarkable. Other: None MRA HEAD FINDINGS ANTERIOR CIRCULATION: Internal carotid arteries patent to the termini without flow-limiting stenosis. Origin of the ophthalmic arteries patent. A1 segments, anterior communicating artery, and anterior cerebral arteries widely patent. M1 segments patent without stenosis. No proximal M2 occlusion distal MCA branches well perfused and symmetric. Mild distal small vessel atheromatous change. POSTERIOR CIRCULATION: Vertebral arteries patent to the vertebrobasilar junction without stenosis. Dominant left vertebral artery. Posterior inferior cerebral arteries patent bilaterally.  Basilar widely patent to its distal aspect. Superior cerebral arteries patent bilaterally. Fetal type left PCA. PCAs well perfused to their distal aspects without stenosis. IMPRESSION: MRI HEAD IMPRESSION 1. 9 mm acute ischemic nonhemorrhagic left thalamic lacunar infarct. 2. Remote left posterior MCA territory infarct, with additional remote lacunar infarcts involving the bilateral thalami and right basal ganglia. 3. Atrophy with mild chronic small vessel ischemic disease. MRA HEAD IMPRESSION 1. Negative intracranial  MRA. No large vessel occlusion. No hemodynamically significant or correctable stenosis. 2. Minor atherosclerotic change for age. Electronically Signed   By: Rise Mu M.D.   On: 10/31/2017 02:32   Mr Maxine Glenn Head Wo Contrast  Result Date: 10/31/2017 CLINICAL DATA:  Initial evaluation for right arm and facial numbness with tingling. EXAM: MRI HEAD WITHOUT CONTRAST MRA HEAD WITHOUT CONTRAST TECHNIQUE: Multiplanar, multiecho pulse sequences of the brain and surrounding structures were obtained without intravenous contrast. Angiographic images of the head were obtained using MRA technique without contrast. COMPARISON:  Prior CT from 10/30/2017. FINDINGS: MRI HEAD FINDINGS Brain: Generalized age-related cerebral atrophy. Mild chronic small vessel ischemic change present within the periventricular white matter. Remote posterior left MCA territory infarct with associated encephalomalacia and gliosis within the left parietal region. Associated chronic hemosiderin staining. Scattered remote lacunar infarcts present within the bilateral thalami as well as the right basal ganglia. 9 mm acute ischemic left thalamic lacunar infarct without associated hemorrhage or mass effect (series 3, image 26). No other evidence for acute or subacute ischemia. Gray-white matter differentiation otherwise maintained. No evidence for acute intracranial hemorrhage. No mass lesion, midline shift or mass effect. No extra-axial fluid collection. No hydrocephalus. Mild ex vacuo dilatation of the lateral ventricles bilaterally related to remote ischemic changes. Pituitary gland suprasellar region normal. Midline structures intact and normal. Vascular: Major intracranial vascular flow voids are maintained. Skull and upper cervical spine: Craniocervical junction normal. Upper cervical spine within normal limits. Bone marrow signal intensity normal. No scalp soft tissue abnormality. Sinuses/Orbits: Unremarkable. Other: None MRA HEAD FINDINGS  ANTERIOR CIRCULATION: Internal carotid arteries patent to the termini without flow-limiting stenosis. Origin of the ophthalmic arteries patent. A1 segments, anterior communicating artery, and anterior cerebral arteries widely patent. M1 segments patent without stenosis. No proximal M2 occlusion distal MCA branches well perfused and symmetric. Mild distal small vessel atheromatous change. POSTERIOR CIRCULATION: Vertebral arteries patent to the vertebrobasilar junction without stenosis. Dominant left vertebral artery. Posterior inferior cerebral arteries patent bilaterally. Basilar widely patent to its distal aspect. Superior cerebral arteries patent bilaterally. Fetal type left PCA. PCAs well perfused to their distal aspects without stenosis. IMPRESSION: MRI HEAD IMPRESSION 1. 9 mm acute ischemic nonhemorrhagic left thalamic lacunar infarct. 2. Remote left posterior MCA territory infarct, with additional remote lacunar infarcts involving the bilateral thalami and right basal ganglia. 3. Atrophy with mild chronic small vessel ischemic disease. MRA HEAD IMPRESSION 1. Negative intracranial MRA. No large vessel occlusion. No hemodynamically significant or correctable stenosis. 2. Minor atherosclerotic change for age. Electronically Signed   By: Rise Mu M.D.   On: 10/31/2017 02:32    PHYSICAL EXAM General: Appears well-developed and well-nourished.  Psych: Affect appropriate to situation Eyes: No scleral injection HENT: No OP obstrucion Head: Normocephalic.  Cardiovascular: Normal rate and regular rhythm.  Respiratory: Effort normal and breath sounds normal to anterior ascultation  Neurological Examination Mental Status: Alert, oriented, thought content appropriate.  Speech fluent without evidence of aphasia (can name, repeat). Able to follow 3 step commands without difficulty. Cranial Nerves: II: Visual fields grossly  normal,  III,IV, VI: ptosis not present, extra-ocular motions intact  bilaterally, pupils equal, round, reactive to light and accommodation V,VII: smile symmetric, facial light touch sensation normal bilaterally VIII: hearing normal bilaterally IX,X: uvula rises symmetrically XI: bilateral shoulder shrug XII: midline tongue extension Motor: Right :  Upper extremity   4+/5                                                Left:     Upper extremity   5/5             Lower extremity   5/5                                           Lower extremity   5/5 Tone and bulk:normal tone throughout; no atrophy noted Sensory: decreased pinprick and light touch to R side - face (forehead, nose, chin - does not split at midline), arm/hand/fingers (new) and leg (longstanding, not new)  Cerebellar: normal finger-to-nose, normal rapid alternating movements and normal heel-to-shin test Gait: deferred   ASSESSMENT/PLAN [name] is a 57 y.o. male with history of HTN, CAD, DB, CKD, tobacco use presenting with R arm numbness and tingling x 4 days, out of window for tPA  Stroke:  left thalamic infarct secondary to small vessel disease source  CT head old L parietal and thalamic infarcts  MRI head L thalamic infarct. Old L post MCA, B thalamic and R BG infarcts. Small vessel disease. Atrophy.   MRA head negative. Mild atherosclerosis   CT angio neck atherosclerosis. 40% L subclavian artery stenosis   2D Echo  pending   LDL 38  HgbA1c 9.9  Lovenox 40 mg sq daily for VTE prophylaxis  aspirin 81 mg daily and clopidogrel 75 mg daily prior to admission, now on aspirin 81 mg daily and aspirin 325 mg daily. Continue at d/c  Patient counseled to be compliant with his antithrombotic medications  Ongoing aggressive stroke risk factor management  Therapy recommendations:  OP PT  Disposition:  pending   Hypertension  Stable . Permissive hypertension (OK if < 220/120) but gradually normalize in 5-7 days . Long-term BP goal normotensive  Hyperlipidemia  Home meds:   lipitor 20, increased to 80 mg in hospital by Dr. Toniann Fail  LDL 38, goal < 70  Continue statin at discharge  Diabetes type II  HgbA1c 9.9, goal < 7.0  Uncontrolled  DB education for how to address insulin and db treatment schedule recommended  Other Stroke Risk Factors  Cigarette smoker, advised to stop smoking  ETOH use, advised to drink no more than 2 drink(s) a day  UDS / ETOH level negative   Coronary artery disease s/p stent  Hx stroke - former L parietal stroke seen on imaging. Appears embolic. Etiology of stroke unclear. 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 See Amion for Schedule & Pager information 10/31/2017  1:58 PM   ATTENDING NOTE: I reviewed above note and agree with the assessment and plan. I have made any additions or clarifications directly to the above note. Pt was seen and examined.   Patient 57-year-old male with history of CAD/MI in 2009, CKD, DM, current smoker, PVD with multiple stents, HLD, one-time seizure due to metformin overdose, left MCA stroke in 2012 admitted for right-sided numbness for 4 days.  CT showed left parietal MCA old infarct, left thalamic subacute infarct.  MRI showed acute left thalamic infarct and old left MCA and right thalamus infarct.  MRA unremarkable.  CTA neck with mild atherosclerosis but no significant stenosis.  TTE pending.  LDL 38 and A1c 9.9.  UDS negative.  Patient had stroke in 2012 with sudden onset aphasia.  Admitted to hospital in [place].  Not sure about a stroke work-up over there, however patient stated that he was not told any etiology for the stroke.  It to him 6 months to get  speech back.  He also had MI in 2009, and multiple lower extremity stent for PVD.  In 2016, he had one seizure after doubled metformin dose.  Since discontinuation of metformin, he has no seizure since.  However, he is continued on Keppra since then.  Given his embolic pattern of stroke in 1610, would like to further work-up with TCD bubble study, LE venous Doppler, and hypercoagulable work-up.  Continue aspirin Lipitor and Plavix as well as Keppra as his home medication.  Patient continued to have decreased right arm and face sensation, but otherwise neurologically intact.  Will follow.  Marvel Plan, MD PhD Stroke Neurology 10/31/2017 4:07 PM  I spent  35 minutes in total face-to-face time with the patient, more than 50% of which was spent in counseling and coordination of care, reviewing test results, images and medication, and discussing the diagnosis of acute and chronic infarcts, treatment plan and potential prognosis. This patient's care requiresreview of multiple databases, neurological assessment, discussion with family, other specialists and medical decision making of high complexity. I had long discussion with pt at bedside, updated pt current condition, treatment plan and potential prognosis. Discussed about further work up for his acute and chronic strokes. He expressed understanding and appreciation.         To contact Stroke Continuity provider, please refer to [email or web address]. After hours, contact General Neurology

## 2017-10-31 NOTE — ED Notes (Signed)
Pt reports not taking his lantus until 0800 daily.

## 2017-10-31 NOTE — Evaluation (Addendum)
Physical Therapy Evaluation Patient Details Name: Curtis Bradford MRN: [id number] DOB: January 04, 1961 Today's Date: 10/31/2017   History of Present Illness  Pt is a 57 y/o male admitted secondary to R sided numbness. Imaging revelaed L thalamic lacunar infarct and L posterior MCA territory infarct. 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   Clinical Impression  Pt admitted secondary to problem above with deficits below. Pt presenting with RUE and RLE numbness during evaluation. Pt unsteady during gait requiring min to min guard A. Tended to drift to the R during ambulation as well. Will need practice with cane during next session for increase stability during gait. Pt reports girlfriend will be able to assist as needed upon d/c. Will continue to follow acutely to maximize functional mobility independence and safety.     Follow Up Recommendations Outpatient PT;Supervision for mobility/OOB(neuro outpatient PT )    Equipment Recommendations  Cane    Recommendations for Other Services       Precautions / Restrictions Precautions Precautions: Fall Restrictions Weight Bearing Restrictions: No      Mobility  Bed Mobility Overal bed mobility: Independent                Transfers Overall transfer level: Needs assistance Equipment used: None Transfers: Sit to/from Stand Sit to Stand: Min guard         General transfer comment: Min guard for steadying assist.   Ambulation/Gait Ambulation/Gait assistance: Min guard;Min assist Ambulation Distance (Feet): 100 Feet Assistive device: None Gait Pattern/deviations: Step-through pattern;Decreased stride length;Drifts right/left Gait velocity: Decreased  Gait velocity interpretation: <1.8 ft/sec, indicate of risk for recurrent falls General Gait Details: Slow, slightly unsteady gait requiring min to min guard A for mobility. Tended to drift to the R during ambulation. Asked pt about visual deficits, however,  pt reports no visual deficits.   Stairs            Wheelchair Mobility    Modified Rankin (Stroke Patients Only)       Balance Overall balance assessment: Needs assistance Sitting-balance support: No upper extremity supported;Feet supported Sitting balance-Leahy Scale: Good     Standing balance support: No upper extremity supported;During functional activity Standing balance-Leahy Scale: Fair                               Pertinent Vitals/Pain Pain Assessment: No/denies pain    Home Living Family/patient expects to be discharged to:: Private residence Living Arrangements: Spouse/significant other Available Help at Discharge: Family;Available 24 hours/day Type of Home: Apartment Home Access: Level entry     Home Layout: One level Home Equipment: None      Prior Function Level of Independence: Independent               Hand Dominance        Extremity/Trunk Assessment   Upper Extremity Assessment Upper Extremity Assessment: Defer to OT evaluation(RUE numbness )    Lower Extremity Assessment Lower Extremity Assessment: RLE deficits/detail RLE Deficits / Details: Strength overall WFL, however, pt reports decreased sensation.  RLE Sensation: decreased light touch    Cervical / Trunk Assessment Cervical / Trunk Assessment: Normal  Communication   Communication: No difficulties  Cognition Arousal/Alertness: Awake/alert Behavior During Therapy: WFL for tasks assessed/performed Overall Cognitive Status: Within Functional Limits for tasks assessed  General Comments      Exercises     Assessment/Plan    PT Assessment Patient needs continued PT services  PT Problem List Decreased balance;Decreased mobility;Decreased knowledge of use of DME;Impaired sensation       PT Treatment Interventions DME instruction;Gait training;Functional mobility training;Therapeutic  activities;Therapeutic exercise;Balance training;Patient/family education    PT Goals (Current goals can be found in the Care Plan section)  Acute Rehab PT Goals Patient Stated Goal: to go home  PT Goal Formulation: With patient Time For Goal Achievement: 11/14/17 Potential to Achieve Goals: Good Additional Goals Additional Goal #1: Pt will score >19 on DGI to ensure low fall risk.    Frequency Min 4X/week   Barriers to discharge        Co-evaluation               AM-PAC PT "6 Clicks" Daily Activity  Outcome Measure Difficulty turning over in bed (including adjusting bedclothes, sheets and blankets)?: None Difficulty moving from lying on back to sitting on the side of the bed? : None Difficulty sitting down on and standing up from a chair with arms (e.g., wheelchair, bedside commode, etc,.)?: Unable Help needed moving to and from a bed to chair (including a wheelchair)?: A Little Help needed walking in hospital room?: A Little Help needed climbing 3-5 steps with a railing? : A Little 6 Click Score: 18    End of Session Equipment Utilized During Treatment: Gait belt Activity Tolerance: Patient tolerated treatment well Patient left: in bed;with call bell/phone within reach Nurse Communication: Mobility status PT Visit Diagnosis: Unsteadiness on feet (R26.81);Other symptoms and signs involving the nervous system (R29.898);Other abnormalities of gait and mobility (R26.89)    Time: 1047-1100 PT Time Calculation (min) (ACUTE ONLY): 13 min   Charges:   PT Evaluation $PT Eval Low Complexity: 1 Low     PT G Codes:        Gladys Damme, PT, DPT  Acute Rehabilitation Services  Pager: [phone]   Lehman Prom 10/31/2017, 11:16 AM

## 2017-10-31 NOTE — ED Notes (Signed)
PT at bedside.

## 2017-10-31 NOTE — ED Notes (Signed)
Meal tray given 

## 2017-10-31 NOTE — Progress Notes (Addendum)
Inpatient Diabetes Program Recommendations  AACE/ADA: New Consensus Statement on Inpatient Glycemic Control (2015)  Target Ranges:  Prepandial:   less than 140 mg/dL      Peak postprandial:   less than 180 mg/dL (1-2 hours)      Critically ill patients:  140 - 180 mg/dL   Lab Results  Component Value Date   GLUCAP 143 (H) 10/31/2017   HGBA1C 9.9 (H) 10/31/2017    Review of Glycemic ControlResults for Curtis Bradford, Curtis Bradford (MRN [id number]) as of 10/31/2017 16:16  Ref. Range 10/31/2017 07:35 10/31/2017 11:35  Glucose-Capillary Latest Ref Range: 65 - 99 mg/dL 409 (H) 811 (H)    Diabetes history: Type 2 DM Outpatient Diabetes medications:  Lantus 37 units daily, Tradjenta 5 mg daily, Humalog sliding scale (rarely taking) Current orders for Inpatient glycemic control:  Lantus 37 units daily, Novolog sensitive tid with meals, Tradjenta 5 mg daily  Inpatient Diabetes Program Recommendations:    Spoke with patient regarding home DM control.  He states that his last A1C prior to moving was 6.9%. We discussed that most recent A1C is 9.9%.  He admits that he has not been monitoring his blood sugars due to new schedule/ move.  He usually takes Lantus in the morning after getting off of work.  He states that he is rarely taking Humalog sliding scale.  We discussed ways to improve control including CGM.  Briefly discussed CGM-Freestyle libre with patient.  He is very interested.  Gave him information regarding Freestyle Libre study/packet.  Will follow-up with patient on 5/10.  He does need new PCP and possibly new endocrinologist.  Blood sugars currently ok.  Will follow up on 5/10.  Thanks,  Beryl Meager, RN, BC-ADM Inpatient Diabetes Coordinator Pager [phone] (8a-5p)

## 2017-10-31 NOTE — ED Notes (Signed)
Attempted report 

## 2017-10-31 NOTE — Consult Note (Signed)
Requesting Physician: Dr. Toniann Fail     Chief Complaint: Right side numbness  History obtained from: Patient and Chart     HPI:                                                                                                                                       Curtis Bradford is an 57 y.o. male a past medical history of CVA, poorly controlled diabetes mellitus, coronary artery disease, CKD, hyperlipidemia, seizure and tobacco abuse who presents to the emergency  Department with a four-day history of right-sided numbness and right arm weakness.  He states his symptoms started about 4 days ago with numbness of his right arm. His symptoms were mild and the patient thought that he may have slept on his arm. However the numbness persisted and later started to involve right side of his face and mouth. Describes a sensation as novacaine.  CT Head showed chronic left parietal lobe infarct and a hypodensity in the left thalamus that could represent a subacute/chronic infarct. Patient has not been taking his  Aspirin for the last 2 weeks. He has cut down on smoking.  Date last known well: 4 days ago tPA Given: no, outside window  NIHSS: 1 Baseline MRS 0     Past Medical History:  Diagnosis Date  . CAD (coronary artery disease)   . CKD (chronic kidney disease), stage III (HCC)   . 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 DVT (deep vein thrombosis) in pregnancy (HCC)   . HLD (hyperlipidemia)   . Seizure (HCC)   . Tobacco abuse     Past Surgical History:  Procedure Laterality Date  . AMPUTATION Right 02/23/2017   Procedure: RIGHT FOOT 5TH RAY AMPUTATION;  Surgeon: [name], MD;  Location: [facility] OR;  Service: Orthopedics;  Laterality: Right;  . FOOT SURGERY    . STENT PLACEMENT VASCULAR ([facility] HX)     Several in right leg    Family History  Problem Relation Age of Onset  . Diabetes Mellitus II Mother   . Diabetes Mellitus II Father    Social History:  reports that he  has been smoking cigarettes.  He has never used smokeless tobacco. He reports that he does not drink alcohol or use drugs.  Allergies: No Known Allergies  Medications:  I reviewed home medications   ROS:                                                                                                                                     14 systems reviewed and negative except above    Examination:                                                                                                      General: Appears well-developed and well-nourished.  Psych: Affect appropriate to situation Eyes: No scleral injection HENT: No OP obstrucion Head: Normocephalic.  Cardiovascular: Normal rate and regular rhythm.  Respiratory: Effort normal and breath sounds normal to anterior ascultation GI: Soft.  No distension. There is no tenderness.  Skin: WDI   Neurological Examination Mental Status: Alert, oriented, thought content appropriate.  Speech fluent without evidence of aphasia. Able to follow 3 step commands without difficulty. Cranial Nerves: II: Visual fields grossly normal,  III,IV, VI: ptosis not present, extra-ocular motions intact bilaterally, pupils equal, round, reactive to light and accommodation V,VII: smile symmetric, facial light touch sensation normal bilaterally VIII: hearing normal bilaterally IX,X: uvula rises symmetrically XI: bilateral shoulder shrug XII: midline tongue extension Motor: Right : Upper extremity   4+/5    Left:     Upper extremity   5/5  Lower extremity   5/5     Lower extremity   5/5 Tone and bulk:normal tone throughout; no atrophy noted Sensory: Pinprick and light touch intact throughout, bilaterally Deep Tendon Reflexes: 2+ and symmetric throughout Plantars: Right: downgoing   Left: downgoing Cerebellar: normal finger-to-nose,  normal rapid alternating movements and normal heel-to-shin test Gait: normal gait and station     Lab Results: Basic Metabolic Panel: Recent Labs  Lab 10/30/17 2143 10/30/17 2150  NA 139 140  K 3.9 3.8  CL 108 104  CO2 23  --   GLUCOSE 156* 148*  BUN 20 22*  CREATININE 1.49* 1.40*  CALCIUM 8.9  --     CBC: Recent Labs  Lab 10/30/17 2143 10/30/17 2150  WBC 10.2  --   NEUTROABS 6.6  --   HGB 15.1 15.0  HCT 44.3 44.0  MCV 91.2  --   PLT 302  --     Coagulation Studies: Recent Labs    10/30/17 11-18-2141  LABPROT 12.4  INR 0.93    Imaging: Ct Head Wo Contrast  Result Date: 10/30/2017 CLINICAL DATA:  Right-sided face and hand numbness for 3 days. EXAM:  CT HEAD WITHOUT CONTRAST TECHNIQUE: Contiguous axial images were obtained from the base of the skull through the vertex without intravenous contrast. COMPARISON:  None. FINDINGS: Brain: No mass lesion, intraparenchymal hemorrhage or extra-axial collection. No evidence of acute cortical infarct. Old left parietal lobe infarct and left thalamus lacunar infarct. Vascular: No hyperdense vessel or unexpected vascular calcification. Skull: Normal visualized skull base, calvarium and extracranial soft tissues. Sinuses/Orbits: No sinus fluid levels or advanced mucosal thickening. No mastoid effusion. Normal orbits. 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 Electronically Signed   By: Deatra Robinson M.D.   On: 10/30/2017 22:17   Mr Brain Wo Contrast  Result Date: 10/31/2017 CLINICAL DATA:  Initial evaluation for right arm and facial numbness with tingling. EXAM: MRI HEAD WITHOUT CONTRAST MRA HEAD WITHOUT CONTRAST TECHNIQUE: Multiplanar, multiecho pulse sequences of the brain and surrounding structures were obtained without intravenous contrast. Angiographic images of the head were obtained using MRA technique without contrast. COMPARISON:  Prior CT from 10/30/2017. FINDINGS: MRI HEAD FINDINGS  Brain: Generalized age-related cerebral atrophy. Mild chronic small vessel ischemic change present within the periventricular white matter. Remote posterior left MCA territory infarct with associated encephalomalacia and gliosis within the left parietal region. Associated chronic hemosiderin staining. Scattered remote lacunar infarcts present within the bilateral thalami as well as the right basal ganglia. 9 mm acute ischemic left thalamic lacunar infarct without associated hemorrhage or mass effect (series 3, image 26). No other evidence for acute or subacute ischemia. Gray-white matter differentiation otherwise maintained. No evidence for acute intracranial hemorrhage. No mass lesion, midline shift or mass effect. No extra-axial fluid collection. No hydrocephalus. Mild ex vacuo dilatation of the lateral ventricles bilaterally related to remote ischemic changes. Pituitary gland suprasellar region normal. Midline structures intact and normal. Vascular: Major intracranial vascular flow voids are maintained. Skull and upper cervical spine: Craniocervical junction normal. Upper cervical spine within normal limits. Bone marrow signal intensity normal. No scalp soft tissue abnormality. Sinuses/Orbits: Unremarkable. Other: None MRA HEAD FINDINGS ANTERIOR CIRCULATION: Internal carotid arteries patent to the termini without flow-limiting stenosis. Origin of the ophthalmic arteries patent. A1 segments, anterior communicating artery, and anterior cerebral arteries widely patent. M1 segments patent without stenosis. No proximal M2 occlusion distal MCA branches well perfused and symmetric. Mild distal small vessel atheromatous change. POSTERIOR CIRCULATION: Vertebral arteries patent to the vertebrobasilar junction without stenosis. Dominant left vertebral artery. Posterior inferior cerebral arteries patent bilaterally. Basilar widely patent to its distal aspect. Superior cerebral arteries patent bilaterally. Fetal type left  PCA. PCAs well perfused to their distal aspects without stenosis. IMPRESSION: MRI HEAD IMPRESSION 1. 9 mm acute ischemic nonhemorrhagic left thalamic lacunar infarct. 2. Remote left posterior MCA territory infarct, with additional remote lacunar infarcts involving the bilateral thalami and right basal ganglia. 3. Atrophy with mild chronic small vessel ischemic disease. MRA HEAD IMPRESSION 1. Negative intracranial MRA. No large vessel occlusion. No hemodynamically significant or correctable stenosis. 2. Minor atherosclerotic change for age. Electronically Signed   By: Rise Mu M.D.   On: 10/31/2017 02:32   Mr Maxine Glenn Head Wo Contrast  Result Date: 10/31/2017 CLINICAL DATA:  Initial evaluation for right arm and facial numbness with tingling. EXAM: MRI HEAD WITHOUT CONTRAST MRA HEAD WITHOUT CONTRAST TECHNIQUE: Multiplanar, multiecho pulse sequences of the brain and surrounding structures were obtained without intravenous contrast. Angiographic images of the head were obtained using MRA technique without contrast. COMPARISON:  Prior CT from 10/30/2017. FINDINGS: MRI HEAD FINDINGS Brain: Generalized age-related cerebral atrophy.  Mild chronic small vessel ischemic change present within the periventricular white matter. Remote posterior left MCA territory infarct with associated encephalomalacia and gliosis within the left parietal region. Associated chronic hemosiderin staining. Scattered remote lacunar infarcts present within the bilateral thalami as well as the right basal ganglia. 9 mm acute ischemic left thalamic lacunar infarct without associated hemorrhage or mass effect (series 3, image 26). No other evidence for acute or subacute ischemia. Gray-white matter differentiation otherwise maintained. No evidence for acute intracranial hemorrhage. No mass lesion, midline shift or mass effect. No extra-axial fluid collection. No hydrocephalus. Mild ex vacuo dilatation of the lateral ventricles bilaterally  related to remote ischemic changes. Pituitary gland suprasellar region normal. Midline structures intact and normal. Vascular: Major intracranial vascular flow voids are maintained. Skull and upper cervical spine: Craniocervical junction normal. Upper cervical spine within normal limits. Bone marrow signal intensity normal. No scalp soft tissue abnormality. Sinuses/Orbits: Unremarkable. Other: None MRA HEAD FINDINGS ANTERIOR CIRCULATION: Internal carotid arteries patent to the termini without flow-limiting stenosis. Origin of the ophthalmic arteries patent. A1 segments, anterior communicating artery, and anterior cerebral arteries widely patent. M1 segments patent without stenosis. No proximal M2 occlusion distal MCA branches well perfused and symmetric. Mild distal small vessel atheromatous change. POSTERIOR CIRCULATION: Vertebral arteries patent to the vertebrobasilar junction without stenosis. Dominant left vertebral artery. Posterior inferior cerebral arteries patent bilaterally. Basilar widely patent to its distal aspect. Superior cerebral arteries patent bilaterally. Fetal type left PCA. PCAs well perfused to their distal aspects without stenosis. IMPRESSION: MRI HEAD IMPRESSION 1. 9 mm acute ischemic nonhemorrhagic left thalamic lacunar infarct. 2. Remote left posterior MCA territory infarct, with additional remote lacunar infarcts involving the bilateral thalami and right basal ganglia. 3. Atrophy with mild chronic small vessel ischemic disease. MRA HEAD IMPRESSION 1. Negative intracranial MRA. No large vessel occlusion. No hemodynamically significant or correctable stenosis. 2. Minor atherosclerotic change for age. Electronically Signed   By: Rise Mu M.D.   On: 10/31/2017 02:32     ASSESSMENT AND PLAN  57 y.o. male a past medical history of CVA, poorly controlled diabetes mellitus, coronary artery disease, CKD, hyperlipidemia, seizure who presents with right-sided numbness and mild  weakness. MRI brain shows a left acute/subacute thalamic stroke.  Acute Ischemic Stroke   Risk factors : poorly controlled DM, smoking, HLD, HTN Etiology: small vessel disease  Recommend #MRA Head and neck  #Transthoracic Echo  # Start patient on ASA  daily, consider adding Plavix 75 daily x 3 weeks #Start or continue Atorvastatin 80 mg/other high intensity statin # BP goal: permissive HTN upto 210 systolic, PRNs above 21 # HBAIC and Lipid profile # Telemetry monitoring # Frequent neuro checks # NPO until passes stroke swallow screen  Please page stroke NP  Or  PA  Or MD from 8am -4 pm  as this patient from this time will be  followed by the stroke.   You can look them up on [email or web address]  Password [facility]    Enes Rokosz [facility] Pager Number [phone]

## 2017-10-31 NOTE — ED Notes (Signed)
Patient transported to CT 

## 2017-10-31 NOTE — Progress Notes (Signed)
Patient Demographics:    Curtis Bradford, is a 57 y.o. male, DOB - Nov 17, 1960, ZOX:[id number]  Admit date - 10/30/2017   Admitting Physician Curtis Clos, MD  Outpatient Primary MD for the patient is Curtis Bradford  LOS - 0   Chief Complaint  Patient presents with  . Numbness        Subjective:    Curtis Bradford today has no fevers, no emesis,  No chest pain, no new concerns  Assessment  & Plan :    Principal Problem:   TIA (transient ischemic attack) Active Problems:   Type II diabetes mellitus with renal manifestations (HCC)   CKD stage 2 due to type 2 diabetes mellitus (HCC)   CAD (coronary artery disease)   Seizure disorder (HCC)   Peripheral artery disease (HCC)   Subacute osteomyelitis, right ankle and foot (HCC)  Brief summary:- 57 y.o. male with history of CAD status post PCI, peripheral vascular disease, chronic right great toe infection, diabetes mellitus type 2, tobacco abuse, chronic kidney disease admitted on 10/30/2017 with right upper extremity and right facial numbness found to have an acute stroke.  [facility] neurologist further work-up as inpatient warranted at this time   Plan:-  1)Acute Stroke:  left thalamic infarct secondary to small vessel disease source-prior stroke in 2012 as well, shortly patient continues to smoke, neurology consult appreciated, [facility] neurologist patient may need outpatient loop recorder to rule out A. Fib, further neurologic work-up including echo and CTA of the neck pending [facility] neurologist, neurologist as ordered hypercoagulability work-up, transcranial Doppler with bubble and lower extremity Dopplers have been ordered by neurologist and are pending at the time of this dictation,   2)H/o Sz- c/n Keppra, no evidence of seizures at this time  3)H/o CAD/PVD-status post lower extremity and coronary stent, continue aspirin, Plavix and Lipitor  4)HTN- Allow  some permissive Hypertension due to acute stroke, avoid very aggressive, rapid BP control at this time.   5)DM-hemoglobin A1c is 9.9, compliance with regimen advised, resume insulin regimen, give Tradjenta  6) tobacco abuse-  Smoking cessation counseling for 4 minutes today, consider nicotine patch  7)CKD II-avoid nephrotoxic agents, monitor renal function closely in view of CT angios with contrast   Code Status : Full code  Disposition Plan  : Possible discharge on 11/01/2017 if okay with neurologist  Consults  :  Neurology   DVT Prophylaxis  :  Lovenox   Lab Results  Component Value Date   PLT 261 10/31/2017    Inpatient Medications  Scheduled Meds: .  stroke: mapping our early stages of recovery book   Does not apply Once  . aspirin EC  81 mg Oral QHS  . atorvastatin  80 mg Oral QHS  . clopidogrel  75 mg Oral QHS  . enoxaparin (LOVENOX) injection  40 mg Subcutaneous Q24H  . insulin aspart  0-9 Units Subcutaneous TID WC  . insulin glargine  37 Units Subcutaneous Daily  . levETIRAcetam  1,000 mg Oral QHS  . linagliptin  5 mg Oral Daily  . silver sulfADIAZINE   Topical Daily   Continuous Infusions: . sodium chloride 50 mL/hr at 10/31/17 1545   PRN Meds:.acetaminophen **OR** acetaminophen (TYLENOL) oral liquid 160 mg/5 mL **OR** acetaminophen  Anti-infectives (From admission, onward)   None        Objective:   Vitals:   10/31/17 1345 10/31/17 1400 10/31/17 1653 10/31/17 2000  BP:  (!) 111/94 (!) 158/81 (!) 164/89  Pulse: 72 77 67 67  Resp: Temp:    98.1 F (36.7 C)  TempSrc:    Oral  SpO2: 96% 97% 98% 99%  Weight:      Height:        Wt Readings from Last 3 Encounters:  10/30/17 90.7 kg (200 lb)  04/11/17 89.8 kg (198 lb)  03/28/17 90.7 kg (200 lb)     Intake/Output Summary (Last 24 hours) at 10/31/2017 2035 Last data filed at 10/31/2017 1700 Gross [facility] 24 hour  Intake 300 ml  Output -  Net 300 ml    Physical Exam  Gen:- Awake  Alert,  In no apparent distress  HEENT:- [place].AT, No sclera icterus Neck-Supple Neck,No JVD,.  Lungs-  CTAB , good air movement CV- S1, S2 normal Abd-  +ve B.Sounds, Abd Soft, No tenderness,    Extremity/Skin:- No  edema,   good pulses Psych-affect is appropriate, oriented x3 Neuro-no new focal deficits, no tremors   Data Review:   Micro Results No results found for this or any previous visit (from the past 240 hour(s)).  Radiology Reports Ct Head Wo Contrast  Result Date: 10/30/2017 CLINICAL DATA:  Right-sided face and hand numbness for 3 days. EXAM: CT HEAD WITHOUT CONTRAST TECHNIQUE: Contiguous axial images were obtained from the base of the skull through the vertex without intravenous contrast. COMPARISON:  None. FINDINGS: Brain: No mass lesion, intraparenchymal hemorrhage or extra-axial collection. No evidence of acute cortical infarct. Old left parietal lobe infarct and left thalamus lacunar infarct. Vascular: No hyperdense vessel or unexpected vascular calcification. Skull: Normal visualized skull base, calvarium and extracranial soft tissues. Sinuses/Orbits: No sinus fluid levels or advanced mucosal thickening. No mastoid effusion. Normal orbits. 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 Electronically Signed   By: Deatra Robinson M.D.   On: 10/30/2017 22:17   Ct Angio Neck W Or Wo Contrast  Result Date: 10/31/2017 CLINICAL DATA:  Stroke follow-up EXAM: CT ANGIOGRAPHY NECK TECHNIQUE: Multidetector CT imaging of the neck was performed using the standard protocol during bolus administration of intravenous contrast. Multiplanar CT image reconstructions and MIPs were obtained to evaluate the vascular anatomy. Carotid stenosis measurements (when applicable) are obtained utilizing NASCET criteria, using the distal internal carotid diameter as the denominator. CONTRAST:  50mL ISOVUE-370 IOPAMIDOL (ISOVUE-370) INJECTION 76% COMPARISON:  Brain MRI and  MRA from earlier today atherosclerotic calcification. Two vessel branching. FINDINGS: Aortic arch: Noncalcified plaque along the brachiocephalic without flow limiting stenosis or ulceration. Right carotid system: Mild mixed density plaque at the common carotid bifurcation and ICA bulb without stenosis or ulceration. Left carotid system: Mild atheromatous plaque at the common carotid origin and bifurcation. No stenosis or ulceration. Negative for beading. Vertebral arteries: Proximal subclavian primarily noncalcified atherosclerosis on the left with 40% stenosis. Mild left vertebral artery dominance. The vertebral arteries are smooth and diffusely patent to the dura. Skeleton: Diffuse cervical spine degeneration with mild dextrocurvature. No acute finding. Other neck: Negative Upper chest: Negative IMPRESSION: 1. Atherosclerosis without flow limiting stenosis or ulceration. 2. 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 Electronically Signed   By: Marnee Spring M.D.   On: 10/31/2017 10:25   Mr Brain Wo Contrast  Result Date: 10/31/2017 CLINICAL DATA:  Initial evaluation  for right arm and facial numbness with tingling. EXAM: MRI HEAD WITHOUT CONTRAST MRA HEAD WITHOUT CONTRAST TECHNIQUE: Multiplanar, multiecho pulse sequences of the brain and surrounding structures were obtained without intravenous contrast. Angiographic images of the head were obtained using MRA technique without contrast. COMPARISON:  Prior CT from 10/30/2017. FINDINGS: MRI HEAD FINDINGS Brain: Generalized age-related cerebral atrophy. Mild chronic small vessel ischemic change present within the periventricular white matter. Remote posterior left MCA territory infarct with associated encephalomalacia and gliosis within the left parietal region. Associated chronic hemosiderin staining. Scattered remote lacunar infarcts present within the bilateral thalami as well as the right basal ganglia. 9 mm acute ischemic left thalamic lacunar infarct without  associated hemorrhage or mass effect (series 3, image 26). No other evidence for acute or subacute ischemia. Gray-white matter differentiation otherwise maintained. No evidence for acute intracranial hemorrhage. No mass lesion, midline shift or mass effect. No extra-axial fluid collection. No hydrocephalus. Mild ex vacuo dilatation of the lateral ventricles bilaterally related to remote ischemic changes. Pituitary gland suprasellar region normal. Midline structures intact and normal. Vascular: Major intracranial vascular flow voids are maintained. Skull and upper cervical spine: Craniocervical junction normal. Upper cervical spine within normal limits. Bone marrow signal intensity normal. No scalp soft tissue abnormality. Sinuses/Orbits: Unremarkable. Other: None MRA HEAD FINDINGS ANTERIOR CIRCULATION: Internal carotid arteries patent to the termini without flow-limiting stenosis. Origin of the ophthalmic arteries patent. A1 segments, anterior communicating artery, and anterior cerebral arteries widely patent. M1 segments patent without stenosis. No proximal M2 occlusion distal MCA branches well perfused and symmetric. Mild distal small vessel atheromatous change. POSTERIOR CIRCULATION: Vertebral arteries patent to the vertebrobasilar junction without stenosis. Dominant left vertebral artery. Posterior inferior cerebral arteries patent bilaterally. Basilar widely patent to its distal aspect. Superior cerebral arteries patent bilaterally. Fetal type left PCA. PCAs well perfused to their distal aspects without stenosis. IMPRESSION: MRI HEAD IMPRESSION 1. 9 mm acute ischemic nonhemorrhagic left thalamic lacunar infarct. 2. Remote left posterior MCA territory infarct, with additional remote lacunar infarcts involving the bilateral thalami and right basal ganglia. 3. Atrophy with mild chronic small vessel ischemic disease. MRA HEAD IMPRESSION 1. Negative intracranial MRA. No large vessel occlusion. No hemodynamically  significant or correctable stenosis. 2. Minor atherosclerotic change for age. Electronically Signed   By: Rise Mu M.D.   On: 10/31/2017 02:32   Mr Maxine Glenn Head Wo Contrast  Result Date: 10/31/2017 CLINICAL DATA:  Initial evaluation for right arm and facial numbness with tingling. EXAM: MRI HEAD WITHOUT CONTRAST MRA HEAD WITHOUT CONTRAST TECHNIQUE: Multiplanar, multiecho pulse sequences of the brain and surrounding structures were obtained without intravenous contrast. Angiographic images of the head were obtained using MRA technique without contrast. COMPARISON:  Prior CT from 10/30/2017. FINDINGS: MRI HEAD FINDINGS Brain: Generalized age-related cerebral atrophy. Mild chronic small vessel ischemic change present within the periventricular white matter. Remote posterior left MCA territory infarct with associated encephalomalacia and gliosis within the left parietal region. Associated chronic hemosiderin staining. Scattered remote lacunar infarcts present within the bilateral thalami as well as the right basal ganglia. 9 mm acute ischemic left thalamic lacunar infarct without associated hemorrhage or mass effect (series 3, image 26). No other evidence for acute or subacute ischemia. Gray-white matter differentiation otherwise maintained. No evidence for acute intracranial hemorrhage. No mass lesion, midline shift or mass effect. No extra-axial fluid collection. No hydrocephalus. Mild ex vacuo dilatation of the lateral ventricles bilaterally related to remote ischemic changes. Pituitary gland suprasellar region normal. Midline structures intact and normal.  Vascular: Major intracranial vascular flow voids are maintained. Skull and upper cervical spine: Craniocervical junction normal. Upper cervical spine within normal limits. Bone marrow signal intensity normal. No scalp soft tissue abnormality. Sinuses/Orbits: Unremarkable. Other: None MRA HEAD FINDINGS ANTERIOR CIRCULATION: Internal carotid arteries  patent to the termini without flow-limiting stenosis. Origin of the ophthalmic arteries patent. A1 segments, anterior communicating artery, and anterior cerebral arteries widely patent. M1 segments patent without stenosis. No proximal M2 occlusion distal MCA branches well perfused and symmetric. Mild distal small vessel atheromatous change. POSTERIOR CIRCULATION: Vertebral arteries patent to the vertebrobasilar junction without stenosis. Dominant left vertebral artery. Posterior inferior cerebral arteries patent bilaterally. Basilar widely patent to its distal aspect. Superior cerebral arteries patent bilaterally. Fetal type left PCA. PCAs well perfused to their distal aspects without stenosis. IMPRESSION: MRI HEAD IMPRESSION 1. 9 mm acute ischemic nonhemorrhagic left thalamic lacunar infarct. 2. Remote left posterior MCA territory infarct, with additional remote lacunar infarcts involving the bilateral thalami and right basal ganglia. 3. Atrophy with mild chronic small vessel ischemic disease. MRA HEAD IMPRESSION 1. Negative intracranial MRA. No large vessel occlusion. No hemodynamically significant or correctable stenosis. 2. Minor atherosclerotic change for age. Electronically Signed   By: Rise Mu M.D.   On: 10/31/2017 02:32     CBC Recent Labs  Lab 10/30/17 2143 10/30/17 2150 10/31/17 0045  WBC 10.2  --  10.4  HGB 15.1 15.0 13.8  HCT 44.3 44.0 40.0  PLT 302  --  261  MCV 91.2  --  91.1  MCH 31.1  --  31.4  MCHC 34.1  --  34.5  RDW 13.9  --  13.9  LYMPHSABS 2.6  --   --   MONOABS 0.6  --   --   EOSABS 0.4  --   --   BASOSABS 0.0  --   --     Chemistries  Recent Labs  Lab 10/30/17 2143 10/30/17 2150 10/31/17 0045  NA 139 140  --   K 3.9 3.8  --   CL 108 104  --   CO2 23  --   --   GLUCOSE 156* 148*  --   BUN 20 22*  --   CREATININE 1.49* 1.40* 1.36*  CALCIUM 8.9  --   --   AST 19  --   --   ALT 25  --   --   ALKPHOS 90  --   --   BILITOT 0.4  --   --     ------------------------------------------------------------------------------------------------------------------ Recent Labs    10/31/17 0356  CHOL 97  HDL 21*  LDLCALC 38  TRIG 161*  CHOLHDL 4.6    Lab Results  Component Value Date   HGBA1C 9.9 (H) 10/31/2017  ------------------------------------------------------------------------------------------------------------------ No results for input(s): TSH, T4TOTAL, T3FREE, THYROIDAB in the last 72 hours.  Invalid input(s): FREET3 ------------------------------------------------------------------------------------------------------------------ No results for input(s): VITAMINB12, FOLATE, FERRITIN, TIBC, IRON, RETICCTPCT in the last 72 hours.  Coagulation profile Recent Labs  Lab 10/30/17 2143  INR 0.93    No results for input(s): DDIMER in the last 72 hours.  Cardiac Enzymes Recent Labs  Lab 10/31/17 0834  TROPONINI <0.03   ------------------------------------------------------------------------------------------------------------------ No results found for: BNP  Shon Hale M.D on 10/31/2017 at 8:35 PM  Between 7am to 7pm - Pager - [phone]  After 7pm go to [email or web address] - password [email or web address]  [facility] -  Office  [phone]   Voice Recognition Reubin Milan dictation system was used to create this note, attempts have  been made to correct errors. Please contact the author with questions and/or clarifications.

## 2017-11-01 ENCOUNTER — Observation Stay (HOSPITAL_BASED_OUTPATIENT_CLINIC_OR_DEPARTMENT_OTHER): Payer: Managed Care, Other (non HMO)

## 2017-11-01 ENCOUNTER — Observation Stay (HOSPITAL_COMMUNITY): Payer: Managed Care, Other (non HMO)

## 2017-11-01 DIAGNOSIS — M86271 Subacute osteomyelitis, right ankle and foot: Secondary | ICD-10-CM | POA: Diagnosis not present

## 2017-11-01 DIAGNOSIS — I63332 Cerebral infarction due to thrombosis of left posterior cerebral artery: Secondary | ICD-10-CM | POA: Diagnosis not present

## 2017-11-01 DIAGNOSIS — N182 Chronic kidney disease, stage 2 (mild): Secondary | ICD-10-CM | POA: Diagnosis not present

## 2017-11-01 DIAGNOSIS — I639 Cerebral infarction, unspecified: Secondary | ICD-10-CM | POA: Diagnosis not present

## 2017-11-01 DIAGNOSIS — Z794 Long term (current) use of insulin: Secondary | ICD-10-CM | POA: Diagnosis not present

## 2017-11-01 DIAGNOSIS — R202 Paresthesia of skin: Secondary | ICD-10-CM | POA: Diagnosis not present

## 2017-11-01 DIAGNOSIS — E1122 Type 2 diabetes mellitus with diabetic chronic kidney disease: Secondary | ICD-10-CM | POA: Diagnosis not present

## 2017-11-01 DIAGNOSIS — G459 Transient cerebral ischemic attack, unspecified: Secondary | ICD-10-CM | POA: Diagnosis not present

## 2017-11-01 DIAGNOSIS — I2510 Atherosclerotic heart disease of native coronary artery without angina pectoris: Secondary | ICD-10-CM | POA: Diagnosis not present

## 2017-11-01 DIAGNOSIS — G40909 Epilepsy, unspecified, not intractable, without status epilepticus: Secondary | ICD-10-CM | POA: Diagnosis not present

## 2017-11-01 LAB — BASIC METABOLIC PANEL
Anion gap: 11 (ref 5–15)
BUN: 19 mg/dL (ref 6–20)
CHLORIDE: 100 mmol/L — AB (ref 101–111)
CO2: 26 mmol/L (ref 22–32)
CREATININE: 1.35 mg/dL — AB (ref 0.61–1.24)
Calcium: 9 mg/dL (ref 8.9–10.3)
GFR calc Af Amer: >60 mL/min (ref >60)
GFR calc non Af Amer: 58 mL/min — ABNORMAL LOW (ref >60)
GLUCOSE: 245 mg/dL — AB (ref 65–99)
Potassium: 3.7 mmol/L (ref 3.5–5.1)
SODIUM: 137 mmol/L (ref 135–145)

## 2017-11-01 LAB — ANTITHROMBIN III: ANTITHROMB III FUNC: 115 % (ref 75–120)

## 2017-11-01 LAB — GLUCOSE, CAPILLARY
GLUCOSE-CAPILLARY: 251 mg/dL — AB (ref 65–99)
GLUCOSE-CAPILLARY: 137 mg/dL — AB (ref 65–99)

## 2017-11-01 MED ORDER — ATORVASTATIN CALCIUM 80 MG PO TABS: 80.0000 mg | ORAL_TABLET | Freq: Every day | ORAL | 0 refills | Status: DC

## 2017-11-01 MED ORDER — SODIUM CHLORIDE 0.9 % IV SOLN
INTRAVENOUS | Status: DC
  Administered 2017-11-01: 09:00:00 via INTRAVENOUS

## 2017-11-01 MED ORDER — FREESTYLE LIBRE 14 DAY SENSOR MISC: 1.0000 "application " | Freq: Once | 1 refills | Status: DC

## 2017-11-01 NOTE — Care Management Note (Addendum)
Case Management Note  Patient Details  Name: Burr Soffer MRN: [id number] Date of Birth: 09/13/1960  Subjective/Objective:    Admitted for Transient Ischemic Attack.  No PCP noted.             Action/Plan: Patient with discharge orders home today.  See discharge for the following:  Discussed recommendation for Outpatient Physical therapy, patient offered choice and selected [facility].  Referral has been sent internally for Outpatient Therapy;ncm arranged for provider to call patient with appointment.  Discussed Physical therapy recommendation for DME: cane; patient refused.  NCM in to speak with patient regarding No PCP.  Per patient request NCM provided (2) provider options for PCP and also advised patient can call his insurance if needed for recommendations. At discharge patient has transportation home.  Expected Discharge Date:  11/01/17               Expected Discharge Plan:  OP Rehab Discharge planning Services  CM Consult Post Acute Care Choice:   Outpatient Rehab Choice offered to:  Patient DME Arranged:  Gilmer Mor, Patient refused services DME Agency:   N/A Status of Service:  Completed, signed off  Yancey Flemings, RN 11/01/2017, 3:35 PM

## 2017-11-01 NOTE — Evaluation (Addendum)
Occupational Therapy Evaluation Patient Details Name: Curtis Bradford MRN: [id number] DOB: 03-06-1961 Today's Date: 11/01/2017    History of Present Illness Pt is a 57 y/o male admitted secondary to R sided numbness. Imaging revelaed L thalamic lacunar infarct and L posterior MCA territory infarct. 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    Clinical Impression   This 57 y/o male presents with the above. At baseline pt is independent with ADLs, iADLs, and functional mobility, was working. Pt completing room and hallway functional mobility without AD and overall minguard-supervision; demonstrates LB ADLs with minguard assist. Pt presenting with RUE impairments including RUE numbness, requiring increased time and effort to complete ADL tasks. Reviewed and educated pt regarding fine motor and coordination activities using RUE. Pt will benefit from continued acute OT services and recommend follow up outpatient neuro OT services to progress his RUE function, as well as to maximize his overall safety and independence with ADLs and mobility.     Follow Up Recommendations  Outpatient OT;Supervision - Intermittent(outpatient neuro OT )    Equipment Recommendations  None recommended by OT           Precautions / Restrictions Precautions Precautions: Fall Restrictions Weight Bearing Restrictions: No      Mobility Bed Mobility               General bed mobility comments: sitting EOB upon entering room   Transfers Overall transfer level: Needs assistance Equipment used: None Transfers: Sit to/from Stand Sit to Stand: Supervision         General transfer comment: supervision for safety     Balance Overall balance assessment: Needs assistance Sitting-balance support: No upper extremity supported;Feet supported Sitting balance-Leahy Scale: Good     Standing balance support: No upper extremity supported;During functional activity Standing  balance-Leahy Scale: Fair                             ADL either performed or assessed with clinical judgement   ADL Overall ADL's : Needs assistance/impaired Eating/Feeding: Supervision/ safety;Sitting Eating/Feeding Details (indicate cue type and reason): pt reports increased time/difficulty due to RUE deficits Grooming: Min guard;Standing   Upper Body Bathing: Min guard;Sitting   Lower Body Bathing: Min guard;Sit to/from stand   Upper Body Dressing : Set up;Sitting   Lower Body Dressing: Min guard;Sit to/from stand Lower Body Dressing Details (indicate cue type and reason): pt donning socks and shoes sitting EOB; requires significant time and multiple attempts to tie shoelaces but ultimately is able to do so without assist  Toilet Transfer: Min guard;Ambulation;Regular Social worker and Hygiene: Min guard;Sit to/from stand       Functional mobility during ADLs: Min guard;Supervision/safety General ADL Comments: pt completing room and hallway functional mobility with close minguard for safety; functional deficits mostly appear to be due to RUE limitations      Vision Baseline Vision/History: Wears glasses Wears Glasses: Reading only Patient Visual Report: Other (comment);Blurring of vision(pt reports he has been needing to see his eye doctor ) Additional Comments: vision overall appears WFL during functional tasks, noted to be squinting when reading handout provided however pt did not have readers with him; vision to be further assessed                Pertinent Vitals/Pain Pain Assessment: No/denies pain     Hand Dominance Right   Extremity/Trunk Assessment Upper  Extremity Assessment Upper Extremity Assessment: RUE deficits/detail RUE Deficits / Details: pt with numbness throughout RUE, from digits to shoulder; strength grossly 3+/5 (R weaker than L); and with decreased fine motor and impaired coordination  RUE Sensation:  decreased light touch;decreased proprioception RUE Coordination: decreased fine motor   Lower Extremity Assessment Lower Extremity Assessment: Defer to PT evaluation   Cervical / Trunk Assessment Cervical / Trunk Assessment: Normal   Communication Communication Communication: No difficulties   Cognition Arousal/Alertness: Awake/alert Behavior During Therapy: Agitated;WFL for tasks assessed/performed Overall Cognitive Status: Within Functional Limits for tasks assessed                                 General Comments: pt initially frustrated at start of session, IV beeping and pt attempting to disconnect IV, upon standing pt's telemetry box falling resulting in pt ripping off telemetry lines due to frustration; given time and progression of session pt becoming less agitated and cooperative with therapist    General Comments       Exercises Other Exercises Other Exercises: educated pt on variety of fine motor activities, desensitization, and stereognosis activities for RUE, handout provided and reviewed         Home Living Family/patient expects to be discharged to:: Private residence Living Arrangements: Non-relatives/Friends Available Help at Discharge: Family;Available 24 hours/day Type of Home: Apartment Home Access: Level entry     Home Layout: One level     Bathroom Shower/Tub: Chief Strategy Officer: Standard     Home Equipment: None          Prior Functioning/Environment Level of Independence: Independent        Comments: working for [employer], reports he uses the computer and often walking between buildings throughout each day         OT Problem List: Impaired UE functional use;Impaired sensation;Decreased coordination      OT Treatment/Interventions: Self-care/ADL training;DME and/or AE instruction;Therapeutic activities;Balance training;Therapeutic exercise;Visual/perceptual remediation/compensation;Patient/family  education;Neuromuscular education    OT Goals(Current goals can be found in the care plan section) Acute Rehab OT Goals Patient Stated Goal: to go home  OT Goal Formulation: With patient Time For Goal Achievement: 11/15/17 Potential to Achieve Goals: Good   Min 2X/week                             AM-PAC PT "6 Clicks" Daily Activity     Outcome Measure Help from another person eating meals?: A Little Help from another person taking care of personal grooming?: A Little Help from another person toileting, which includes using toliet, bedpan, or urinal?: None Help from another person bathing (including washing, rinsing, drying)?: A Little Help from another person to put on and taking off regular upper body clothing?: None Help from another person to put on and taking off regular lower body clothing?: A Little 6 Click Score: 20   End of Session Equipment Utilized During Treatment: Gait belt Nurse Communication: Mobility status;Other (comment)(IV beeping )  Activity Tolerance: Patient tolerated treatment well Patient left: with call bell/phone within reach;Other (comment)(sitting EOB )  OT Visit Diagnosis: Other symptoms and signs involving the nervous system (W09.811)                Time: 0925-1000 OT Time Calculation (min): 35 min Charges:  OT General Charges $OT Visit: 1 Visit OT Evaluation $OT Eval Moderate Complexity:  1 Mod OT Treatments $Therapeutic Activity: 8-22 mins G-Codes:     Marcy Siren, OT Pager [phone] 11/01/2017   Curtis Bradford 11/01/2017, 10:19 AM

## 2017-11-01 NOTE — Discharge Summary (Signed)
Physician Discharge Summary  Curtis Bradford ZOX:[id number] DOB: 03-01-1961 DOA: 10/30/2017  PCP: Patient, No Pcp Per  Admit date: 10/30/2017 Discharge date: 11/01/2017  Admitted From: Home  Disposition: Home   Recommendations for Outpatient Follow-up:  1. Follow up with PCP in 1-2 weeks 2. Please obtain BMP/CBC in one week 3. Please follow up on the following pending results: hypercoagulable panel results.  4. Continue to work on risk factors modifications.   Home Health: none  Discharge Condition: stable.  CODE STATUS; full code.  Diet recommendation: carb modified diet    Brief/Interim Summary: Brief summary:- 57 y.o.malewithhistory of CAD status post PCI, peripheral vascular disease, chronic right great toe infection, diabetes mellitus type 2, tobacco abuse, chronic kidney disease admitted on 10/30/2017 with right upper extremity and right facial numbness found to have an acute stroke.  Per neurologist further work-up as inpatient warranted at this time   Plan:-  1)Acute Stroke:leftthalamicinfarct secondary to small vessel disease source-prior stroke in 2012 as well. MRI;9 mm acute ischemic nonhemorrhagic left thalamic lacunar infarct. 2. Remote left posterior MCA territory infarct, with additional remote lacunar infarcts involving the bilateral thalami and right basal ganglia. LDL 38 Triglycerides 1899. He will work on diet.  HB a1c at 9. Diet education provided. Continue with insulin.  Transcranial doppler; no evidence of PFO.  LE doppler; Negative for DVT.  ECHO; Left ventricle: The cavity size was normal. Systolic function was normal. The estimated ejection fraction was in the range of 55% to 60%. Hypokinesis of the apical myocardium. Doppler parameters are consistent with abnormal left ventricular relaxation (grade 1 diastolic dysfunction). Continue with aspirin and plavix.  Continue with lipitor.   2)H/o Sz- c/n Keppra, no evidence of seizures at this  time  3)H/o CAD/PVD-status post lower extremity and coronary stent, continue aspirin, Plavix and Lipitor  4)HTN- Allow some permissive Hypertension due to acute stroke, avoid very aggressive, rapid BP control at this time.   5)DM-hemoglobin A1c is 9.9, compliance with regimen advised, resume insulin regimen, give Tradjenta  6) tobacco abuse-  Smoking cessation counseling for 4 minutes today, consider nicotine patch  7)CKD II-avoid nephrotoxic agents, monitor renal function closely in view of CT angios with contrast     Discharge Diagnoses:  Principal Problem:   TIA (transient ischemic attack) Active Problems:   Type II diabetes mellitus with renal manifestations (HCC)   CKD stage 2 due to type 2 diabetes mellitus (HCC)   CAD (coronary artery disease)   Seizure disorder (HCC)   Peripheral artery disease (HCC)   Subacute osteomyelitis, right ankle and foot (HCC)    Discharge Instructions  Discharge Instructions    Diet - low sodium heart healthy   Complete by:  As directed    Increase activity slowly   Complete by:  As directed      Allergies as of 11/01/2017   No Known Allergies     Medication List    STOP taking these medications   oxyCODONE 5 MG immediate release tablet Commonly known as:  Oxy IR/ROXICODONE     TAKE these medications   aspirin EC 81 MG tablet Take 81 mg by mouth at bedtime.   atorvastatin 80 MG tablet Commonly known as:  LIPITOR Take 1 tablet (80 mg total) by mouth at bedtime. What changed:    medication strength  how much to take   clopidogrel 75 MG tablet Commonly known as:  PLAVIX Take 75 mg by mouth at bedtime.   FREESTYLE LIBRE 14 DAY SENSOR Misc  1 application by Does not apply route once for 1 dose.   HUMALOG KWIKPEN [place] Inject 0-15 Units into the skin 2 (two) times daily. Per sliding scale   insulin glargine 100 unit/mL Sopn Commonly known as:  LANTUS Inject 37 Units into the skin at bedtime.   levETIRAcetam 1000  MG tablet Commonly known as:  KEPPRA Take 1,000 mg by mouth at bedtime.   linagliptin 5 MG Tabs tablet Commonly known as:  TRADJENTA Take 5 mg by mouth daily.   silver sulfADIAZINE 1 % cream Commonly known as:  SILVADENE Apply 1 application topically daily.      Follow-up Information    [facility] Follow up.   Specialty:  Rehabilitation Why:  They will call you to set up appointment Contact information: [address] [id number] [address]c [place] [address] [phone]       [facility] Follow up.   Why:  You can call your insurance company to assist with finding Primary Care Provider or Call this provider and set up appointment to establish Primary Care Provider Contact information: [address] [place], [place] [address]  [phone]       [facility] Follow up.   Specialty:  Pediatrics Why:  You can call your insurance company to assist with finding Primary Care Provider or Call this provider and set up appointment to establish Primary Care Provider Contact information: [address] [place] [address] [phone]          No Known Allergies  Consultations:  Neurology     Procedures/Studies: Ct Head Wo Contrast  Result Date: 10/30/2017 CLINICAL DATA:  Right-sided face and hand numbness for 3 days. EXAM: CT HEAD WITHOUT CONTRAST TECHNIQUE: Contiguous axial images were obtained from the base of the skull through the vertex without intravenous contrast. COMPARISON:  None. FINDINGS: Brain: No mass lesion, intraparenchymal hemorrhage or extra-axial collection. No evidence of acute cortical infarct. Old left parietal lobe infarct and left thalamus lacunar infarct. Vascular: No hyperdense vessel or unexpected vascular calcification. Skull: Normal visualized skull base, calvarium and extracranial soft tissues.  Sinuses/Orbits: No sinus fluid levels or advanced mucosal thickening. No mastoid effusion. Normal orbits. 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 Electronically Signed   By: Deatra Robinson M.D.   On: 10/30/2017 22:17   Ct Angio Neck W Or Wo Contrast  Result Date: 10/31/2017 CLINICAL DATA:  Stroke follow-up EXAM: CT ANGIOGRAPHY NECK TECHNIQUE: Multidetector CT imaging of the neck was performed using the standard protocol during bolus administration of intravenous contrast. Multiplanar CT image reconstructions and MIPs were obtained to evaluate the vascular anatomy. Carotid stenosis measurements (when applicable) are obtained utilizing NASCET criteria, using the distal internal carotid diameter as the denominator. CONTRAST:  50mL ISOVUE-370 IOPAMIDOL (ISOVUE-370) INJECTION 76% COMPARISON:  Brain MRI and MRA from earlier today atherosclerotic calcification. Two vessel branching. FINDINGS: Aortic arch: Noncalcified plaque along the brachiocephalic without flow limiting stenosis or ulceration. Right carotid system: Mild mixed density plaque at the common carotid bifurcation and ICA bulb without stenosis or ulceration. Left carotid system: Mild atheromatous plaque at the common carotid origin and bifurcation. No stenosis or ulceration. Negative for beading. Vertebral arteries: Proximal subclavian primarily noncalcified atherosclerosis on the left with 40% stenosis. Mild left vertebral artery dominance. The vertebral arteries are smooth and diffusely patent to the dura. Skeleton: Diffuse cervical spine degeneration with mild dextrocurvature.  No acute finding. Other neck: Negative Upper chest: Negative IMPRESSION: 1. Atherosclerosis without flow limiting stenosis or ulceration. 2. 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 Electronically Signed   By: Marnee Spring M.D.   On: 10/31/2017 10:25   Mr Brain Wo Contrast  Result Date: 10/31/2017 CLINICAL DATA:  Initial  evaluation for right arm and facial numbness with tingling. EXAM: MRI HEAD WITHOUT CONTRAST MRA HEAD WITHOUT CONTRAST TECHNIQUE: Multiplanar, multiecho pulse sequences of the brain and surrounding structures were obtained without intravenous contrast. Angiographic images of the head were obtained using MRA technique without contrast. COMPARISON:  Prior CT from 10/30/2017. FINDINGS: MRI HEAD FINDINGS Brain: Generalized age-related cerebral atrophy. Mild chronic small vessel ischemic change present within the periventricular white matter. Remote posterior left MCA territory infarct with associated encephalomalacia and gliosis within the left parietal region. Associated chronic hemosiderin staining. Scattered remote lacunar infarcts present within the bilateral thalami as well as the right basal ganglia. 9 mm acute ischemic left thalamic lacunar infarct without associated hemorrhage or mass effect (series 3, image 26). No other evidence for acute or subacute ischemia. Gray-white matter differentiation otherwise maintained. No evidence for acute intracranial hemorrhage. No mass lesion, midline shift or mass effect. No extra-axial fluid collection. No hydrocephalus. Mild ex vacuo dilatation of the lateral ventricles bilaterally related to remote ischemic changes. Pituitary gland suprasellar region normal. Midline structures intact and normal. Vascular: Major intracranial vascular flow voids are maintained. Skull and upper cervical spine: Craniocervical junction normal. Upper cervical spine within normal limits. Bone marrow signal intensity normal. No scalp soft tissue abnormality. Sinuses/Orbits: Unremarkable. Other: None MRA HEAD FINDINGS ANTERIOR CIRCULATION: Internal carotid arteries patent to the termini without flow-limiting stenosis. Origin of the ophthalmic arteries patent. A1 segments, anterior communicating artery, and anterior cerebral arteries widely patent. M1 segments patent without stenosis. No proximal M2  occlusion distal MCA branches well perfused and symmetric. Mild distal small vessel atheromatous change. POSTERIOR CIRCULATION: Vertebral arteries patent to the vertebrobasilar junction without stenosis. Dominant left vertebral artery. Posterior inferior cerebral arteries patent bilaterally. Basilar widely patent to its distal aspect. Superior cerebral arteries patent bilaterally. Fetal type left PCA. PCAs well perfused to their distal aspects without stenosis. IMPRESSION: MRI HEAD IMPRESSION 1. 9 mm acute ischemic nonhemorrhagic left thalamic lacunar infarct. 2. Remote left posterior MCA territory infarct, with additional remote lacunar infarcts involving the bilateral thalami and right basal ganglia. 3. Atrophy with mild chronic small vessel ischemic disease. MRA HEAD IMPRESSION 1. Negative intracranial MRA. No large vessel occlusion. No hemodynamically significant or correctable stenosis. 2. Minor atherosclerotic change for age. Electronically Signed   By: Rise Mu M.D.   On: 10/31/2017 02:32   Mr Maxine Glenn Head Wo Contrast  Result Date: 10/31/2017 CLINICAL DATA:  Initial evaluation for right arm and facial numbness with tingling. EXAM: MRI HEAD WITHOUT CONTRAST MRA HEAD WITHOUT CONTRAST TECHNIQUE: Multiplanar, multiecho pulse sequences of the brain and surrounding structures were obtained without intravenous contrast. Angiographic images of the head were obtained using MRA technique without contrast. COMPARISON:  Prior CT from 10/30/2017. FINDINGS: MRI HEAD FINDINGS Brain: Generalized age-related cerebral atrophy. Mild chronic small vessel ischemic change present within the periventricular white matter. Remote posterior left MCA territory infarct with associated encephalomalacia and gliosis within the left parietal region. Associated chronic hemosiderin staining. Scattered remote lacunar infarcts present within the bilateral thalami as well as the right basal ganglia. 9 mm acute ischemic left thalamic  lacunar infarct without associated hemorrhage or mass effect (series 3, image 26). No other evidence  for acute or subacute ischemia. Gray-white matter differentiation otherwise maintained. No evidence for acute intracranial hemorrhage. No mass lesion, midline shift or mass effect. No extra-axial fluid collection. No hydrocephalus. Mild ex vacuo dilatation of the lateral ventricles bilaterally related to remote ischemic changes. Pituitary gland suprasellar region normal. Midline structures intact and normal. Vascular: Major intracranial vascular flow voids are maintained. Skull and upper cervical spine: Craniocervical junction normal. Upper cervical spine within normal limits. Bone marrow signal intensity normal. No scalp soft tissue abnormality. Sinuses/Orbits: Unremarkable. Other: None MRA HEAD FINDINGS ANTERIOR CIRCULATION: Internal carotid arteries patent to the termini without flow-limiting stenosis. Origin of the ophthalmic arteries patent. A1 segments, anterior communicating artery, and anterior cerebral arteries widely patent. M1 segments patent without stenosis. No proximal M2 occlusion distal MCA branches well perfused and symmetric. Mild distal small vessel atheromatous change. POSTERIOR CIRCULATION: Vertebral arteries patent to the vertebrobasilar junction without stenosis. Dominant left vertebral artery. Posterior inferior cerebral arteries patent bilaterally. Basilar widely patent to its distal aspect. Superior cerebral arteries patent bilaterally. Fetal type left PCA. PCAs well perfused to their distal aspects without stenosis. IMPRESSION: MRI HEAD IMPRESSION 1. 9 mm acute ischemic nonhemorrhagic left thalamic lacunar infarct. 2. Remote left posterior MCA territory infarct, with additional remote lacunar infarcts involving the bilateral thalami and right basal ganglia. 3. Atrophy with mild chronic small vessel ischemic disease. MRA HEAD IMPRESSION 1. Negative intracranial MRA. No large vessel  occlusion. No hemodynamically significant or correctable stenosis. 2. Minor atherosclerotic change for age. Electronically Signed   By: Rise Mu M.D.   On: 10/31/2017 02:32       Subjective: Persistent numbness right arm  Discharge Exam: Vitals:   11/01/17 0506 11/01/17 0834  BP: (!) 149/77 (!) 168/93  Pulse: (!) 57 (!) 59  Resp:  20  Temp:    SpO2:  99%   Vitals:   11/01/17 0405 11/01/17 0504 11/01/17 0506 11/01/17 0834  BP: (!) 182/91 (!) 151/82 (!) 149/77 (!) 168/93  Pulse: (!) 59 64 (!) 57 (!) 59  Resp: 18   20  Temp: 97.7 F (36.5 C)     TempSrc: Oral     SpO2: 97%   99%  Weight:      Height:        General: Pt is alert, awake, not in acute distress Cardiovascular: RRR, S1/S2 +, no rubs, no gallops Respiratory: CTA bilaterally, no wheezing, no rhonchi Abdominal: Soft, NT, ND, bowel sounds + Extremities: no edema, no cyanosis    The results of significant diagnostics from this hospitalization (including imaging, microbiology, ancillary and laboratory) are listed below for reference.     Microbiology: No results found for this or any previous visit (from the past 240 hour(s)).   Labs: BNP (last 3 results) No results for input(s): BNP in the last 8760 hours. Basic Metabolic Panel: Recent Labs  Lab 10/30/17 2143 10/30/17 2150 10/31/17 0045 11/01/17 0452  NA 139 140  --  137  K 3.9 3.8  --  3.7  CL 108 104  --  100*  CO2 23  --   --  26  GLUCOSE 156* 148*  --  245*  BUN 20 22*  --  19  CREATININE 1.49* 1.40* 1.36* 1.35*  CALCIUM 8.9  --   --  9.0   Liver Function Tests: Recent Labs  Lab 10/30/17 2143  AST 19  ALT 25  ALKPHOS 90  BILITOT 0.4  PROT 6.3*  ALBUMIN 3.7   No results for input(s):  LIPASE, AMYLASE in the last 168 hours. No results for input(s): AMMONIA in the last 168 hours. CBC: Recent Labs  Lab 10/30/17 2143 10/30/17 2150 10/31/17 0045  WBC 10.2  --  10.4  NEUTROABS 6.6  --   --   HGB 15.1 15.0 13.8  HCT 44.3  44.0 40.0  MCV 91.2  --  91.1  PLT 302  --  261   Cardiac Enzymes: Recent Labs  Lab 10/31/17 0834  TROPONINI <0.03   BNP: Invalid input(s): POCBNP CBG: Recent Labs  Lab 10/31/17 1135 10/31/17 1649 10/31/17 2253 11/01/17 0603 11/01/17 1133  GLUCAP 143* 165* 229* 251* 137*   D-Dimer No results for input(s): DDIMER in the last 72 hours. Hgb A1c Recent Labs    10/31/17 0356  HGBA1C 9.9*   Lipid Profile Recent Labs    10/31/17 0356  CHOL 97  HDL 21*  LDLCALC 38  TRIG 409*  CHOLHDL 4.6   Thyroid function studies No results for input(s): TSH, T4TOTAL, T3FREE, THYROIDAB in the last 72 hours.  Invalid input(s): FREET3 Anemia work up No results for input(s): VITAMINB12, FOLATE, FERRITIN, TIBC, IRON, RETICCTPCT in the last 72 hours. Urinalysis    Component Value Date/Time   COLORURINE YELLOW 10/30/2017 2128   APPEARANCEUR CLEAR 10/30/2017 2128   LABSPEC 1.019 10/30/2017 2128   PHURINE 5.0 10/30/2017 2128   GLUCOSEU 150 (A) 10/30/2017 2128   HGBUR SMALL (A) 10/30/2017 2128   BILIRUBINUR NEGATIVE 10/30/2017 2128   KETONESUR NEGATIVE 10/30/2017 2128   PROTEINUR 30 (A) 10/30/2017 2128   NITRITE NEGATIVE 10/30/2017 2128   LEUKOCYTESUR NEGATIVE 10/30/2017 2128   Sepsis Labs Invalid input(s): PROCALCITONIN,  WBC,  LACTICIDVEN Microbiology No results found for this or any previous visit (from the past 240 hour(s)).   Time coordinating discharge:35 minutes.   SIGNED:   Alba Cory, MD  [facility] 11/01/2017, 3:03 PM Pager   If 7PM-7AM, please contact night-coverage [email or web address] Password [email or web address]

## 2017-11-01 NOTE — Progress Notes (Signed)
*  PRELIMINARY RESULTS* Vascular Ultrasound Transcranial Doppler with Bubbles has been completed with Dr. Roda Shutters. There is no obvious evidence of high intensity transient signals (HITS), therefore there is no evidence of patent foramen ovale (PFO).  Bilateral lower extremity venous duplex completed. Bilateral lower extremities are negative for deep vein thrombosis. There is no evidence of Baker's cyst bilaterally.  11/01/2017 2:37 PM [name], BS, RVT, RDCS, RDMS

## 2017-11-01 NOTE — Progress Notes (Signed)
SLP Cancellation Note  Patient Details Name: Curtis Bradford MRN: [id number] DOB: [birth year]   Cancelled treatment:       Reason Eval/Treat Not Completed: SLP screened, no needs identified, will sign off   Kateland Leisinger, Riley Nearing 11/01/2017, 11:16 AM

## 2017-11-01 NOTE — Progress Notes (Signed)
Physical Therapy Treatment Patient Details Name: Curtis Bradford MRN: [id number] DOB: 1961-01-21 Today's Date: 11/01/2017    History of Present Illness Pt is a 57 y/o male admitted secondary to R sided numbness. Imaging revelaed L thalamic lacunar infarct and L posterior MCA territory infarct. 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     PT Comments    Patient agreeable to participate in therapy but agitated that he is in the hospital and having tests today. Pt continues to demonstrate balance deficits and decreased insight into deficits. Pt with score of 16/24 on DGI indicative of risk for falls. Current plan remains appropriate.    Follow Up Recommendations  Outpatient PT;Supervision for mobility/OOB(neuro outpatient PT )     Equipment Recommendations  Cane    Recommendations for Other Services       Precautions / Restrictions Precautions Precautions: Fall Restrictions Weight Bearing Restrictions: No    Mobility  Bed Mobility Overal bed mobility: Independent             General bed mobility comments: sitting EOB upon entering room   Transfers Overall transfer level: Needs assistance Equipment used: None Transfers: Sit to/from Stand Sit to Stand: Supervision         General transfer comment: supervision for safety and cues to stand briefly before atttempting gait  Ambulation/Gait Ambulation/Gait assistance: Min guard;Min assist   Assistive device: None Gait Pattern/deviations: Step-through pattern;Decreased stride length;Drifts right/left Gait velocity: Decreased    General Gait Details: pt unsteady with challenges to gait and declined use of AD; R lateral LOB X3    Stairs Stairs: Yes Stairs assistance: Min guard Stair Management: Two rails;Alternating pattern;Forwards Number of Stairs: 2     Wheelchair Mobility    Modified Rankin (Stroke Patients Only)       Balance Overall balance assessment: Needs  assistance Sitting-balance support: No upper extremity supported;Feet supported Sitting balance-Leahy Scale: Good     Standing balance support: No upper extremity supported;During functional activity Standing balance-Leahy Scale: Fair                              Cognition Arousal/Alertness: Awake/alert Behavior During Therapy: WFL for tasks assessed/performed;Impulsive Overall Cognitive Status: Within Functional Limits for tasks assessed                                 General Comments: pt very impulsive and with lack of insight into deficits       Exercises Other Exercises Other Exercises: educated pt on variety of fine motor activities, desensitization, and stereognosis activities for RUE, handout provided and reviewed    General Comments        Pertinent Vitals/Pain Pain Assessment: No/denies pain    Home Living Family/patient expects to be discharged to:: Private residence Living Arrangements: Non-relatives/Friends Available Help at Discharge: Family;Available 24 hours/day Type of Home: Apartment Home Access: Level entry   Home Layout: One level Home Equipment: None      Prior Function Level of Independence: Independent      Comments: working for [employer], reports he uses the computer and often walking between buildings throughout each day    PT Goals (current goals can now be found in the care plan section) Acute Rehab PT Goals Patient Stated Goal: to go home  PT Goal Formulation: With patient Time For Goal Achievement: 11/14/17  Potential to Achieve Goals: Good Progress towards PT goals: Progressing toward goals    Frequency    Min 4X/week      PT Plan Current plan remains appropriate    Co-evaluation              AM-PAC PT "6 Clicks" Daily Activity  Outcome Measure  Difficulty turning over in bed (including adjusting bedclothes, sheets and blankets)?: None Difficulty moving from lying on back to sitting on  the side of the bed? : None Difficulty sitting down on and standing up from a chair with arms (e.g., wheelchair, bedside commode, etc,.)?: Unable Help needed moving to and from a bed to chair (including a wheelchair)?: A Little Help needed walking in hospital room?: A Little Help needed climbing 3-5 steps with a railing? : A Little 6 Click Score: 18    End of Session Equipment Utilized During Treatment: Gait belt Activity Tolerance: Patient tolerated treatment well Patient left: Other (comment);in chair(transport present to take pt in w/c) Nurse Communication: Mobility status PT Visit Diagnosis: Unsteadiness on feet (R26.81);Other symptoms and signs involving the nervous system (R29.898);Other abnormalities of gait and mobility (R26.89)     Time: 1191-4782 PT Time Calculation (min) (ACUTE ONLY): 12 min  Charges:  $Gait Training: 8-22 mins                    G Codes:       Erline Levine, PTA Pager: [phone]     Carolynne Edouard 11/01/2017, 1:35 PM

## 2017-11-01 NOTE — Progress Notes (Signed)
STROKE TEAM PROGRESS NOTE   SUBJECTIVE (INTERVAL HISTORY) [name] seen at vascular lab. No acute event overnight. TCD bubble study no PFO but artifact recording with valsalva maneuver. No DVT.   OBJECTIVE Vitals:   11/01/17 0033 11/01/17 0405 11/01/17 0504 11/01/17 0506  BP: (!) 174/78 (!) 182/91 (!) 151/82 (!) 149/77  Pulse: 63 (!) 59 64 (!) 57  Resp:  18    Temp:  97.7 F (36.5 C)    TempSrc:  Oral    SpO2:  97%    Weight:      Height:        CBC:  Recent Labs  Lab 10/30/17 2143 10/30/17 2150 10/31/17 0045  WBC 10.2  --  10.4  NEUTROABS 6.6  --   --   HGB 15.1 15.0 13.8  HCT 44.3 44.0 40.0  MCV 91.2  --  91.1  PLT 302  --  261    Basic Metabolic Panel:  Recent Labs  Lab 10/30/17 2143 10/30/17 2150 10/31/17 0045  NA 139 140  --   K 3.9 3.8  --   CL 108 104  --   CO2 23  --   --   GLUCOSE 156* 148*  --   BUN 20 22*  --   CREATININE 1.49* 1.40* 1.36*  CALCIUM 8.9  --   --     Lipid Panel:     Component Value Date/Time   CHOL 97 10/31/2017 0356   TRIG 189 (H) 10/31/2017 0356   HDL 21 (L) 10/31/2017 0356   CHOLHDL 4.6 10/31/2017 0356   VLDL 38 10/31/2017 0356   LDLCALC 38 10/31/2017 0356   HgbA1c:  Lab Results  Component Value Date   HGBA1C 9.9 (H) 10/31/2017   Urine Drug Screen:     Component Value Date/Time   LABOPIA NONE DETECTED 10/30/2017 2128   COCAINSCRNUR NONE DETECTED 10/30/2017 2128   LABBENZ NONE DETECTED 10/30/2017 2128   AMPHETMU NONE DETECTED 10/30/2017 2128   THCU NONE DETECTED 10/30/2017 2128   LABBARB NONE DETECTED 10/30/2017 2128    Alcohol Level     Component Value Date/Time   ETH <10 10/30/2017 2143    IMAGING I have personally reviewed the radiological images below and agree with the radiology interpretations.  Ct Head Wo Contrast  Result Date: 10/30/2017 CLINICAL DATA:  Right-sided face and hand numbness for 3 days. EXAM: CT HEAD WITHOUT CONTRAST TECHNIQUE: Contiguous axial images were obtained from the base of  the skull through the vertex without intravenous contrast. COMPARISON:  None. FINDINGS: Brain: No mass lesion, intraparenchymal hemorrhage or extra-axial collection. No evidence of acute cortical infarct. Old left parietal lobe infarct and left thalamus lacunar infarct. Vascular: No hyperdense vessel or unexpected vascular calcification. Skull: Normal visualized skull base, calvarium and extracranial soft tissues. Sinuses/Orbits: No sinus fluid levels or advanced mucosal thickening. No mastoid effusion. Normal orbits. 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 Electronically Signed   By: Deatra Robinson M.D.   On: 10/30/2017 22:17   Ct Angio Neck W Or Wo Contrast  Result Date: 10/31/2017 CLINICAL DATA:  Stroke follow-up EXAM: CT ANGIOGRAPHY NECK TECHNIQUE: Multidetector CT imaging of the neck was performed using the standard protocol during bolus administration of intravenous contrast. Multiplanar CT image reconstructions and MIPs were obtained to evaluate the vascular anatomy. Carotid stenosis measurements (when applicable) are obtained utilizing NASCET criteria, using the distal internal carotid diameter as the denominator. CONTRAST:  50mL ISOVUE-370 IOPAMIDOL (ISOVUE-370) INJECTION 76%  COMPARISON:  Brain MRI and MRA from earlier today atherosclerotic calcification. Two vessel branching. FINDINGS: Aortic arch: Noncalcified plaque along the brachiocephalic without flow limiting stenosis or ulceration. Right carotid system: Mild mixed density plaque at the common carotid bifurcation and ICA bulb without stenosis or ulceration. Left carotid system: Mild atheromatous plaque at the common carotid origin and bifurcation. No stenosis or ulceration. Negative for beading. Vertebral arteries: Proximal subclavian primarily noncalcified atherosclerosis on the left with 40% stenosis. Mild left vertebral artery dominance. The vertebral arteries are smooth and diffusely patent to  the dura. Skeleton: Diffuse cervical spine degeneration with mild dextrocurvature. No acute finding. Other neck: Negative Upper chest: Negative IMPRESSION: 1. Atherosclerosis without flow limiting stenosis or ulceration. 2. 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 Electronically Signed   By: Marnee Spring M.D.   On: 10/31/2017 10:25   Mr Brain Wo Contrast  Result Date: 10/31/2017 CLINICAL DATA:  Initial evaluation for right arm and facial numbness with tingling. EXAM: MRI HEAD WITHOUT CONTRAST MRA HEAD WITHOUT CONTRAST TECHNIQUE: Multiplanar, multiecho pulse sequences of the brain and surrounding structures were obtained without intravenous contrast. Angiographic images of the head were obtained using MRA technique without contrast. COMPARISON:  Prior CT from 10/30/2017. FINDINGS: MRI HEAD FINDINGS Brain: Generalized age-related cerebral atrophy. Mild chronic small vessel ischemic change present within the periventricular white matter. Remote posterior left MCA territory infarct with associated encephalomalacia and gliosis within the left parietal region. Associated chronic hemosiderin staining. Scattered remote lacunar infarcts present within the bilateral thalami as well as the right basal ganglia. 9 mm acute ischemic left thalamic lacunar infarct without associated hemorrhage or mass effect (series 3, image 26). No other evidence for acute or subacute ischemia. Gray-white matter differentiation otherwise maintained. No evidence for acute intracranial hemorrhage. No mass lesion, midline shift or mass effect. No extra-axial fluid collection. No hydrocephalus. Mild ex vacuo dilatation of the lateral ventricles bilaterally related to remote ischemic changes. Pituitary gland suprasellar region normal. Midline structures intact and normal. Vascular: Major intracranial vascular flow voids are maintained. Skull and upper cervical spine: Craniocervical junction normal. Upper cervical spine within normal limits. Bone  marrow signal intensity normal. No scalp soft tissue abnormality. Sinuses/Orbits: Unremarkable. Other: None MRA HEAD FINDINGS ANTERIOR CIRCULATION: Internal carotid arteries patent to the termini without flow-limiting stenosis. Origin of the ophthalmic arteries patent. A1 segments, anterior communicating artery, and anterior cerebral arteries widely patent. M1 segments patent without stenosis. No proximal M2 occlusion distal MCA branches well perfused and symmetric. Mild distal small vessel atheromatous change. POSTERIOR CIRCULATION: Vertebral arteries patent to the vertebrobasilar junction without stenosis. Dominant left vertebral artery. Posterior inferior cerebral arteries patent bilaterally. Basilar widely patent to its distal aspect. Superior cerebral arteries patent bilaterally. Fetal type left PCA. PCAs well perfused to their distal aspects without stenosis. IMPRESSION: MRI HEAD IMPRESSION 1. 9 mm acute ischemic nonhemorrhagic left thalamic lacunar infarct. 2. Remote left posterior MCA territory infarct, with additional remote lacunar infarcts involving the bilateral thalami and right basal ganglia. 3. Atrophy with mild chronic small vessel ischemic disease. MRA HEAD IMPRESSION 1. Negative intracranial MRA. No large vessel occlusion. No hemodynamically significant or correctable stenosis. 2. Minor atherosclerotic change for age. Electronically Signed   By: Rise Mu M.D.   On: 10/31/2017 02:32   Mr Maxine Glenn Head Wo Contrast  Result Date: 10/31/2017 CLINICAL DATA:  Initial evaluation for right arm and facial numbness with tingling. EXAM: MRI HEAD WITHOUT CONTRAST MRA HEAD WITHOUT CONTRAST TECHNIQUE: Multiplanar, multiecho pulse sequences of the brain and  surrounding structures were obtained without intravenous contrast. Angiographic images of the head were obtained using MRA technique without contrast. COMPARISON:  Prior CT from 10/30/2017. FINDINGS: MRI HEAD FINDINGS Brain: Generalized age-related  cerebral atrophy. Mild chronic small vessel ischemic change present within the periventricular white matter. Remote posterior left MCA territory infarct with associated encephalomalacia and gliosis within the left parietal region. Associated chronic hemosiderin staining. Scattered remote lacunar infarcts present within the bilateral thalami as well as the right basal ganglia. 9 mm acute ischemic left thalamic lacunar infarct without associated hemorrhage or mass effect (series 3, image 26). No other evidence for acute or subacute ischemia. Gray-white matter differentiation otherwise maintained. No evidence for acute intracranial hemorrhage. No mass lesion, midline shift or mass effect. No extra-axial fluid collection. No hydrocephalus. Mild ex vacuo dilatation of the lateral ventricles bilaterally related to remote ischemic changes. Pituitary gland suprasellar region normal. Midline structures intact and normal. Vascular: Major intracranial vascular flow voids are maintained. Skull and upper cervical spine: Craniocervical junction normal. Upper cervical spine within normal limits. Bone marrow signal intensity normal. No scalp soft tissue abnormality. Sinuses/Orbits: Unremarkable. Other: None MRA HEAD FINDINGS ANTERIOR CIRCULATION: Internal carotid arteries patent to the termini without flow-limiting stenosis. Origin of the ophthalmic arteries patent. A1 segments, anterior communicating artery, and anterior cerebral arteries widely patent. M1 segments patent without stenosis. No proximal M2 occlusion distal MCA branches well perfused and symmetric. Mild distal small vessel atheromatous change. POSTERIOR CIRCULATION: Vertebral arteries patent to the vertebrobasilar junction without stenosis. Dominant left vertebral artery. Posterior inferior cerebral arteries patent bilaterally. Basilar widely patent to its distal aspect. Superior cerebral arteries patent bilaterally. Fetal type left PCA. PCAs well perfused to their  distal aspects without stenosis. IMPRESSION: MRI HEAD IMPRESSION 1. 9 mm acute ischemic nonhemorrhagic left thalamic lacunar infarct. 2. Remote left posterior MCA territory infarct, with additional remote lacunar infarcts involving the bilateral thalami and right basal ganglia. 3. Atrophy with mild chronic small vessel ischemic disease. MRA HEAD IMPRESSION 1. Negative intracranial MRA. No large vessel occlusion. No hemodynamically significant or correctable stenosis. 2. Minor atherosclerotic change for age. Electronically Signed   By: Rise Mu M.D.   On: 10/31/2017 02:32   TTE - Left ventricle: The cavity size was normal. Systolic function was   normal. The estimated ejection fraction was in the range of 55%   to 60%. Hypokinesis of the apical myocardium. Doppler parameters   are consistent with abnormal left ventricular relaxation (grade 1   diastolic dysfunction). - Aortic valve: Trileaflet; mildly thickened, mildly calcified   leaflets. - Aortic root: The aortic root was mildly dilated. - Mitral valve: Valve area by pressure half-time: 2.44 cm^2.  TCD bubble study No HIT at rest. No HIT with valsalva, however, there was significant artifact with valsalva likely that interference from sounds he made during valsalva maneuver.  LE venous doppler - Bilateral lower extremities are negative for deep vein thrombosis. There is no evidence of Baker's cyst bilaterally.    PHYSICAL EXAM General: Appears well-developed and well-nourished.  Psych: Affect appropriate to situation Eyes: No scleral injection HENT: No OP obstrucion Head: Normocephalic.  Cardiovascular: Normal rate and regular rhythm.  Respiratory: Effort normal and breath sounds normal to anterior ascultation  Neurological Examination Mental Status: Alert, oriented, thought content appropriate.  Speech fluent without evidence of aphasia (can name, repeat). Able to follow 3 step commands without difficulty. Cranial  Nerves: II: Visual fields grossly normal,  III,IV, VI: ptosis not present, extra-ocular motions intact bilaterally, pupils  equal, round, reactive to light and accommodation V,VII: smile symmetric, facial light touch sensation normal bilaterally VIII: hearing normal bilaterally IX,X: uvula rises symmetrically XI: bilateral shoulder shrug XII: midline tongue extension Motor: Right :  Upper extremity   4+/5                                     Left:     Upper extremity   5/5             Lower extremity   5/5                                                   Lower extremity   5/5 Tone and bulk:normal tone throughout; no atrophy noted Sensory: decreased pinprick and light touch to R side - face (forehead, nose, chin - does not split at midline), arm/hand/fingers (new) and leg (longstanding, not new)  Cerebellar: normal finger-to-nose, normal rapid alternating movements and normal heel-to-shin test Gait: deferred   ASSESSMENT/PLAN Patient 57-year-old male with history of CAD/MI in 2009, CKD, DM, current smoker, PVD with multiple stents, HLD, one-time seizure due to metformin overdose, left MCA stroke in 2012 admitted for right-sided numbness for 4 days.  CT showed left parietal MCA old infarct, left thalamic subacute infarct.  MRI showed acute left thalamic infarct and old left MCA and right thalamus infarct.  MRA unremarkable.  CTA neck with mild atherosclerosis but no significant stenosis. EF 55-60%.  LDL 38 and A1c 9.9.  UDS negative.  Patient had stroke in 2012 with sudden onset aphasia.  Admitted to hospital in [place].  Not sure about a stroke work-up over there, however patient stated that he was not told any etiology for the stroke.  It to him 6 months to get speech back.  He also had MI in 2009, and multiple lower extremity stent for PVD.  In 2016, he had one seizure after doubled metformin dose.  Since discontinuation of metformin, he has no seizure since.  However, he is continued on  Keppra since then.  Given his embolic pattern of stroke in 1610, would like to further work-up with TCD bubble study negative for PFO, LE venous Doppler negative for DVT, and hypercoagulable work-up pending.  Continue aspirin Lipitor and Plavix as well as Keppra as his home medication.  Patient continued to have decreased right arm and face sensation, but otherwise neurologically intact.    Neurology will sign off. Please call with questions. Pt will follow up with stroke clinic NP at [facility] in about 4 weeks. Thanks for the consult.  Marvel Plan, MD PhD Stroke Neurology 11/01/2017 3:54 PM    To contact Stroke Continuity provider, please refer to [email or web address]. After hours, contact General Neurology

## 2017-11-01 NOTE — Progress Notes (Signed)
Patient has signed and been given copy of consent for CGM/Freestyle [place] research study. MD also notified.  Education done regarding application and changing CGM sensor (alternate every 14 days on back of arms), 1 hour warm-up, use of glucometer/where to buy strips, how to scan CGM for glucose reading and information for PCP. Patient has been given [employer] reader and first sensor for use. Patient has also been given educational packet regarding use CGM sensor including the 1-800 toll free number for any questions, problems or needs related to the [facility] sensors or reader.  Patient to be given Rx. For sensors with prescriptions/discharge paper work.  Sensor applied by patient to Left Arm at 1600.  Explained that glucose readings will not be available until 12 hours after application. Patient understands that Diabetes coordinator will call them 2 times after discharge (between days 7-12 after d/c from hospital and between days 30-25 after d/c from hospital). Patient verbalizes understanding of use of Freestyle [place] CGM and was told that any issues with blood sugars/diabetes will need to be addressed by PCP.  Diabetes Quality of Life Survey administered to patient.   Thanks,  Beryl Meager, RN, BC-ADM Inpatient Diabetes Coordinator Pager [phone] (8a-5p)

## 2017-11-02 LAB — ANTI-DNA ANTIBODY, DOUBLE-STRANDED

## 2017-11-02 LAB — LUPUS ANTICOAGULANT PANEL
DRVVT: 41.1 s (ref 0.0–47.0)
PTT Lupus Anticoagulant: 45.1 s (ref 0.0–51.9)

## 2017-11-02 LAB — PROTEIN S ACTIVITY: Protein S Activity: 92 % (ref 63–140)

## 2017-11-02 LAB — PROTEIN C ACTIVITY: Protein C Activity: 106 % (ref 73–180)

## 2017-11-02 LAB — PROTEIN S, TOTAL: Protein S Ag, Total: 79 % (ref 60–150)

## 2017-11-04 LAB — HOMOCYSTEINE: HOMOCYSTEINE-NORM: 13.7 umol/L (ref 0.0–15.0)

## 2017-11-04 LAB — ANTINUCLEAR ANTIBODIES, IFA: ANTINUCLEAR ANTIBODIES, IFA: NEGATIVE

## 2017-11-05 LAB — BETA-2-GLYCOPROTEIN I ABS, IGG/M/A
Beta-2 Glyco I IgG: <9 GPI IgG units (ref 0–20)
Beta-2-Glycoprotein I IgA: <9 GPI IgA units (ref 0–25)

## 2017-11-05 LAB — FACTOR 5 LEIDEN

## 2017-11-06 LAB — CARDIOLIPIN ANTIBODIES, IGG, IGM, IGA: Anticardiolipin IgA: <9 APL U/mL (ref 0–11)

## 2017-11-06 LAB — PROTEIN C, TOTAL: Protein C, Total: 94 % (ref 60–150)

## 2017-11-07 LAB — PROTHROMBIN GENE MUTATION

## 2017-11-08 ENCOUNTER — Other Ambulatory Visit: Payer: Self-pay | Admitting: *Deleted

## 2017-11-12 NOTE — Patient Outreach (Signed)
[facility]) Care Management  11/12/2017  Race Latour 1960-12-16 [id number]  Late entry for 11/08/2017: Referral via Red Alert for EMMI-Stroke: Day#3 11/07/2017 Reason: Question/problem with meds-Yes  Telephone call to patient who was advised of reason for call.   Patient states he has free style self glucose monitoring system in place and he does  not have to do finger sticks. States he accidentally dislodged it several days ago but was able to re-insert new one.  States he was taught how to insert when he was hospitalized.   States it is to changed every 2 weeks & now will be one week short on prescription. States he is pricking finger himself this week. Advised patient to check with his primary care provider to see if he could get prescription for device. Also advised patient to check with customer service for his prescription coverage to inquire if there is allowance for extra device in his situation. Patient states he will do the above and also will call hospital personnel that inserted device to get recommendations for his situation. States meanwhile he will check own blood sugars if prescription runs out for automatic glucose monitoring device.    States he has no future concerns and is doing well since recent hospital admission. States he plans to make appointment today for follow up with [facility]. States he also has appointment with vascular specialist.   EMMI alert has been addressed.  Plan:  Case closure.   Colleen Can, RN BSN CCM Care Management Coordinator [facility] Care Management  [phone]

## 2017-11-19 ENCOUNTER — Telehealth: Payer: Self-pay | Admitting: *Deleted

## 2017-11-20 ENCOUNTER — Telehealth: Payer: Self-pay | Admitting: *Deleted

## 2017-12-03 ENCOUNTER — Encounter: Payer: Self-pay | Admitting: Adult Health

## 2017-12-03 ENCOUNTER — Ambulatory Visit (INDEPENDENT_AMBULATORY_CARE_PROVIDER_SITE_OTHER): Payer: Managed Care, Other (non HMO) | Admitting: Adult Health

## 2017-12-03 VITALS — BP 121/82 | HR 89 | Ht 75.0 in | Wt 198.5 lb

## 2017-12-03 DIAGNOSIS — I639 Cerebral infarction, unspecified: Secondary | ICD-10-CM | POA: Diagnosis not present

## 2017-12-03 DIAGNOSIS — E11621 Type 2 diabetes mellitus with foot ulcer: Secondary | ICD-10-CM

## 2017-12-03 DIAGNOSIS — L97509 Non-pressure chronic ulcer of other part of unspecified foot with unspecified severity: Secondary | ICD-10-CM | POA: Diagnosis not present

## 2017-12-03 DIAGNOSIS — E785 Hyperlipidemia, unspecified: Secondary | ICD-10-CM

## 2017-12-03 DIAGNOSIS — Z794 Long term (current) use of insulin: Secondary | ICD-10-CM | POA: Diagnosis not present

## 2017-12-03 DIAGNOSIS — I6381 Other cerebral infarction due to occlusion or stenosis of small artery: Secondary | ICD-10-CM

## 2017-12-03 DIAGNOSIS — I10 Essential (primary) hypertension: Secondary | ICD-10-CM

## 2017-12-03 MED ORDER — LEVETIRACETAM 1000 MG PO TABS: 1000.0000 mg | ORAL_TABLET | Freq: Every day | ORAL | 4 refills | Status: DC

## 2017-12-03 MED ORDER — CLOPIDOGREL BISULFATE 75 MG PO TABS: 75.0000 mg | ORAL_TABLET | Freq: Every day | ORAL | 4 refills | Status: DC

## 2017-12-03 MED ORDER — FREESTYLE LIBRE 14 DAY SENSOR MISC: 1.0000 "application " | Freq: Once | 1 refills | Status: DC

## 2017-12-03 MED ORDER — ATORVASTATIN CALCIUM 80 MG PO TABS: 80.0000 mg | ORAL_TABLET | Freq: Every day | ORAL | 4 refills | Status: DC

## 2017-12-03 NOTE — Progress Notes (Signed)
[facility] [address] [place]. [place] [address] [phone]       OFFICE FOLLOW UP NOTE  [name] Date of Birth:  [birth year] Medical Record Number:  [id number]   Reason for Referral:  hospital stroke follow up  CHIEF COMPLAINT:  Chief Complaint  Patient presents with  . Follow-up    Stroke follow up pt was seen by Dr Roda Shutters in hospital patient is alone    HPI: Curtis Bradford is being seen today for initial visit in the office for left thalamic infarct on 10/30/2017. History obtained from patient and chart review. Reviewed all radiology images and labs personally.  Patient 57 year-old male with history of CAD/MI in 2009, CKD, DM, current smoker, PVD with multiple stents, HLD, one-time seizure due to metformin overdose, left MCA stroke in 2012 who was admitted for right-sided numbness for 4 days.  CT head showed left parietal MCA old infarct, left thalamic subacute infarct.  MRI showed acute left thalamic infarct and old left MCA and right thalamus infarct.  MRA unremarkable.  CTA neck with mild arthrosclerosis but no significant stenosis.  2D echo showed an EF of 55 to 60%.  LDL satisfactory at 38.  A1c elevated at 9.9.  Patient had stroke in 2012 with sudden onset aphasia.  Admitted to hospital in [place].    It was unsure about a stroke work-up over there, however patient stated that he was not told any etiology for the stroke.  It to him 6 months to get speech back.  He also had MI in 2009, and multiple lower extremity stent for PVD. In 2016, patient did have one seizure after accidentally overdosing on metformin and since discontinuation of metformin, he denies seizure activity but has continued on Keppra.  Patient does have a history of right leg DVT 5 years ago and eventually received 2 stents that were subsequently clogged, unclogged, and clogged then additional 3 stents were placed/replaced.  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  Given history of embolic pattern stroke in 2012, it was decided to further work-up with a TCD bubble study which was negative for PFO, lower extremity venous Dopplers were negative for DVT and hypercoagulable work-up negative.  Recommended increasing Lipitor 20 mg to 80 mg for cholesterol control.  Also recommended close outpatient follow-up with PCP regarding elevated A1c level.  Patient was advised to quit smoking as he is an active smoker.  Patient discharged home in stable condition.    [name] being seen today for hospital follow-up.  He continues to have complaints of right-sided numbness along with facial numbness but has been improving.  He states when he first was discharged from the hospital he was unable to pull something out of his pocket as he was unable to feel anything but now he is able to see on object and pull it out.  He continues to take both aspirin and Plavix with mild bruising but no bleeding.  Continues to take Lipitor without side effects of myalgias.  Blood pressure satisfactory 121/82.Patient has returned to all previous activities including work and driving.   He does have recent complaint of ringing sensation in his right ear along with coughing spells that has been occurring for approximately 2 weeks with mild postnasal drip.  He is using Mucinex DM which has been helping some.   Patient moved to the [facility]  area from [place] last October and has not found a PCP at this time.  Patient does have many medications for diabetic control.  Denies new or worsening stroke/TIA symptoms.   ROS:   14 system review of systems performed and negative with exception of ringing in ears, headache, numbness, weakness, sleepiness  PMH:  Past Medical History:  Diagnosis Date  . CAD (coronary artery disease)   . CKD (chronic kidney disease), stage III (HCC)   . Diabetes mellitus without complication (HCC)     Type II  . DVT (deep vein thrombosis) in pregnancy (HCC)   . HLD (hyperlipidemia)   . Seizure (HCC)   . Stroke (HCC)   . Tobacco abuse     PSH:  Past Surgical History:  Procedure Laterality Date  . AMPUTATION Right 02/23/2017   Procedure: RIGHT FOOT 5TH RAY AMPUTATION;  Surgeon: [name], MD;  Location: [facility];  Service: Orthopedics;  Laterality: Right;  . FOOT SURGERY    . STENT PLACEMENT VASCULAR ([facility] HX)     Several in right leg    Social History:  Social History   Socioeconomic History  . Marital status: Unknown    Spouse name: Not on file  . Number of children: Not on file  . Years of education: Not on file  . Highest education level: Not on file  Occupational History  . Not on file  Social Needs  . Financial resource strain: Not on file  . Food insecurity:    Worry: Not on file    Inability: Not on file  . Transportation needs:    Medical: Not on file    Non-medical: Not on file  Tobacco Use  . Smoking status: Current Some Day Smoker    Packs/day: 1.00    Types: Cigarettes  . Smokeless tobacco: Never Used  . Tobacco comment: pack or less a week/ Occ  Substance and Sexual Activity  . Alcohol use: No    Comment: seldom  . Drug use: No  . Sexual activity: Not on file  Lifestyle  . Physical activity:    Days per week: Not on file    Minutes per session: Not on file  . Stress: Not on file  Relationships  . Social connections:    Talks on phone: Not on file    Gets together: Not on file    Attends religious service: Not on file    Active member of club or organization: Not on file    Attends meetings of clubs or organizations: Not on file    Relationship status: Not on file  . Intimate partner violence:    Fear of current or ex partner: Not on file    Emotionally abused: Not on file    Physically abused: Not on file    Forced sexual activity: Not on file  Other Topics Concern  . Not on file  Social History Narrative  . Not on file    Family  History:  Family History  Problem Relation Age of Onset  . Diabetes Mellitus II Mother   . Diabetes Mellitus II Father     Medications:   Current Outpatient Medications on File Prior to Visit  Medication Sig Dispense Refill  . aspirin EC 81 MG tablet Take 81 mg by mouth at bedtime.    . insulin glargine (LANTUS) 100 unit/mL SOPN Inject 37 Units into the skin at bedtime.    . Insulin Lispro (HUMALOG KWIKPEN [place]) Inject 0-15 Units into  the skin 2 (two) times daily. Per sliding scale    . linagliptin (TRADJENTA) 5 MG TABS tablet Take 5 mg by mouth daily.    . silver sulfADIAZINE (SILVADENE) 1 % cream Apply 1 application topically daily. 50 g 1   No current facility-administered medications on file prior to visit.     Allergies:  No Known Allergies   Physical Exam  Vitals:   12/03/17 1505  BP: 121/82  Pulse: 89  Weight: 198 lb 8 oz (90 kg)  Height: 6\' 3"  (1.905 m)   Body mass index is 24.81 kg/m. No exam data present  General: well developed, pleasant middle-aged Caucasian male, well nourished, seated, in no evident distress Head: head normocephalic and atraumatic.   Neck: supple with no carotid or supraclavicular bruits Cardiovascular: regular rate and rhythm, no murmurs Musculoskeletal: no deformity Skin:  no rash/petichiae, mild amount of ecchymosis Vascular:  Normal pulses all extremities  Neurologic Exam Mental Status: Awake and fully alert. Oriented to place and time. Recent and remote memory intact. Attention span, concentration and fund of knowledge appropriate. Mood and affect appropriate.  Cranial Nerves: Fundoscopic exam reveals sharp disc margins. Pupils equal, briskly reactive to light. Extraocular movements full without nystagmus. Visual fields full to confrontation. Hearing intact. Facial sensation intact. Face, tongue, palate moves normally and symmetrically.  Motor: Normal bulk and tone. Normal strength in all tested extremity muscles. Sensory.:  Decreased  sensation bilateral lower extremities distally along with decreased sensation on right upper and lower extremity Coordination: Rapid alternating movements normal in all extremities. Finger-to-nose and heel-to-shin performed accurately bilaterally. Gait and Station: Arises from chair without difficulty. Stance is normal. Gait demonstrates normal stride length and balance . Able to heel, toe and tandem walk without difficulty.  Reflexes: 1+ and symmetric. Toes downgoing.    NIHSS  1 Modified Rankin  1    Diagnostic Data (Labs, Imaging, Testing)  CT HEAD WO CONTRAST 10/30/2017 IMPRESSION: Old left parietal and thalamic infarcts without acute intracranial Abnormality.  MR BRAIN WO CONTRAST MR MRA HEAD WO CONTRAST 10/31/2017 IMPRESSION: MRI HEAD IMPRESSION 1. 9 mm acute ischemic nonhemorrhagic left thalamic lacunar infarct. 2. Remote left posterior MCA territory infarct, with additional remote lacunar infarcts involving the bilateral thalami and right basal ganglia. 3. Atrophy with mild chronic small vessel ischemic disease. MRA HEAD IMPRESSION 1. Negative intracranial MRA. No large vessel occlusion. No hemodynamically significant or correctable stenosis. 2. Minor atherosclerotic change for age.  CT ANGIO NECK W OR WO CONTRAST 10/31/2017 IMPRESSION: 1. Atherosclerosis without flow limiting stenosis or ulceration. 2. 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  ECHOCARDIOGRAM 10/31/17 Study Conclusions - Left ventricle: The cavity size was normal. Systolic function was   normal. The estimated ejection fraction was in the range of 55%   to 60%. Hypokinesis of the apical myocardium. Doppler parameters   are consistent with abnormal left ventricular relaxation (grade 1   diastolic dysfunction). - Aortic valve: Trileaflet; mildly thickened, mildly calcified   leaflets. - Aortic root: The aortic root was mildly dilated. - Mitral valve: Valve area by pressure half-time: 2.44 cm^2.  VAS [place]  LOWER EXTREMITY VENOUS BILAT (DVT) 11/01/17 Final Interpretation: Right: There is no evidence of deep vein thrombosis in the lower extremity. No cystic structure found in the popliteal fossa. Left: There is no evidence of deep vein thrombosis in the lower extremity. No cystic structure found in the popliteal fossa.  VAS [place] TRANSCRANIAL DOPPLER WITH BUBBLES 11/01/17 There is no obvious evidence of high intensity transient  signals (HITS), therefore there is no evidence of patent foramen ovale (PFO). Artifact was seen with valsalva maneuver, possible muscular interference.     ASSESSMENT: Curtis DimesJohn Eggebrecht is a 57 y.o. year old male here with acute left thalamic infarct on 10/30/17 secondary to small vessel disease. Vascular risk factors include CKD, DM, PVD, HLD and HTN.    PLAN: -Continue aspirin 81 mg daily and clopidogrel 75 mg daily  and Lipitor for secondary stroke prevention -Provided patient with list of PCP within [facility] in the [place] area to follow-up for management of HTN, HLD, PVD and DM management -Referral placed for endocrinologist for diabetes management -continue to monitor BP at home -Consider weaning from Keppra at follow-up appointment -Maintain strict control of hypertension with blood pressure goal below 130/90, diabetes with hemoglobin A1c goal below 6.5% and cholesterol with LDL cholesterol (bad cholesterol) goal below 70 mg/dL. I also advised the patient to eat a healthy diet with plenty of whole grains, cereals, fruits and vegetables, exercise regularly and maintain ideal body weight.  Follow up in 3 months or call earlier if needed   Greater than 50% of time during this 25 minute visit was spent on counseling,explanation of diagnosis of left thalamic infarct, reviewing risk factor management of CKD, DM, PVD, HLD and HTN, planning of further management, discussion with patient and family and coordination of care   George HughJessica Ceclia Koker, [facility] LPGNP-BC  [facility] [address] [place], [place] [id number]  Phone [phone] Fax [phone]

## 2017-12-03 NOTE — Patient Instructions (Signed)
Continue aspirin 81 mg daily and clopidogrel 75 mg daily  and lipitor  for secondary stroke prevention  Please find a PCP regarding cholesterol, blood pressure and diabetes management    Placed referral for endocrinologist for diabetes management  Continue to monitor blood pressure at home  Maintain strict control of hypertension with blood pressure goal below 130/90, diabetes with hemoglobin A1c goal below 6.5% and cholesterol with LDL cholesterol (bad cholesterol) goal below 70 mg/dL. I also advised the patient to eat a healthy diet with plenty of whole grains, cereals, fruits and vegetables, exercise regularly and maintain ideal body weight.  Followup in the future with me in 3 months or call earlier if needed        Thank you for [facility]. I hope we have been able to provide you high quality care today.  You may receive a patient satisfaction survey over the next few weeks. We would appreciate your feedback and comments so that we may continue to improve ourselves and the health of our patients.

## 2017-12-04 NOTE — Progress Notes (Signed)
I agree with the above plan 

## 2017-12-20 ENCOUNTER — Telehealth: Payer: Self-pay | Admitting: *Deleted

## 2017-12-27 IMAGING — DX DG FOOT COMPLETE 3+V*R*
3 series · 3 of 3 positions shown · non-contrast
Comparison: MRI 02/15/2017

CLINICAL DATA: Right foot pain and swelling.

EXAM:
RIGHT FOOT COMPLETE - 3+ VIEW

[dg foot complete right (1 of 3)]
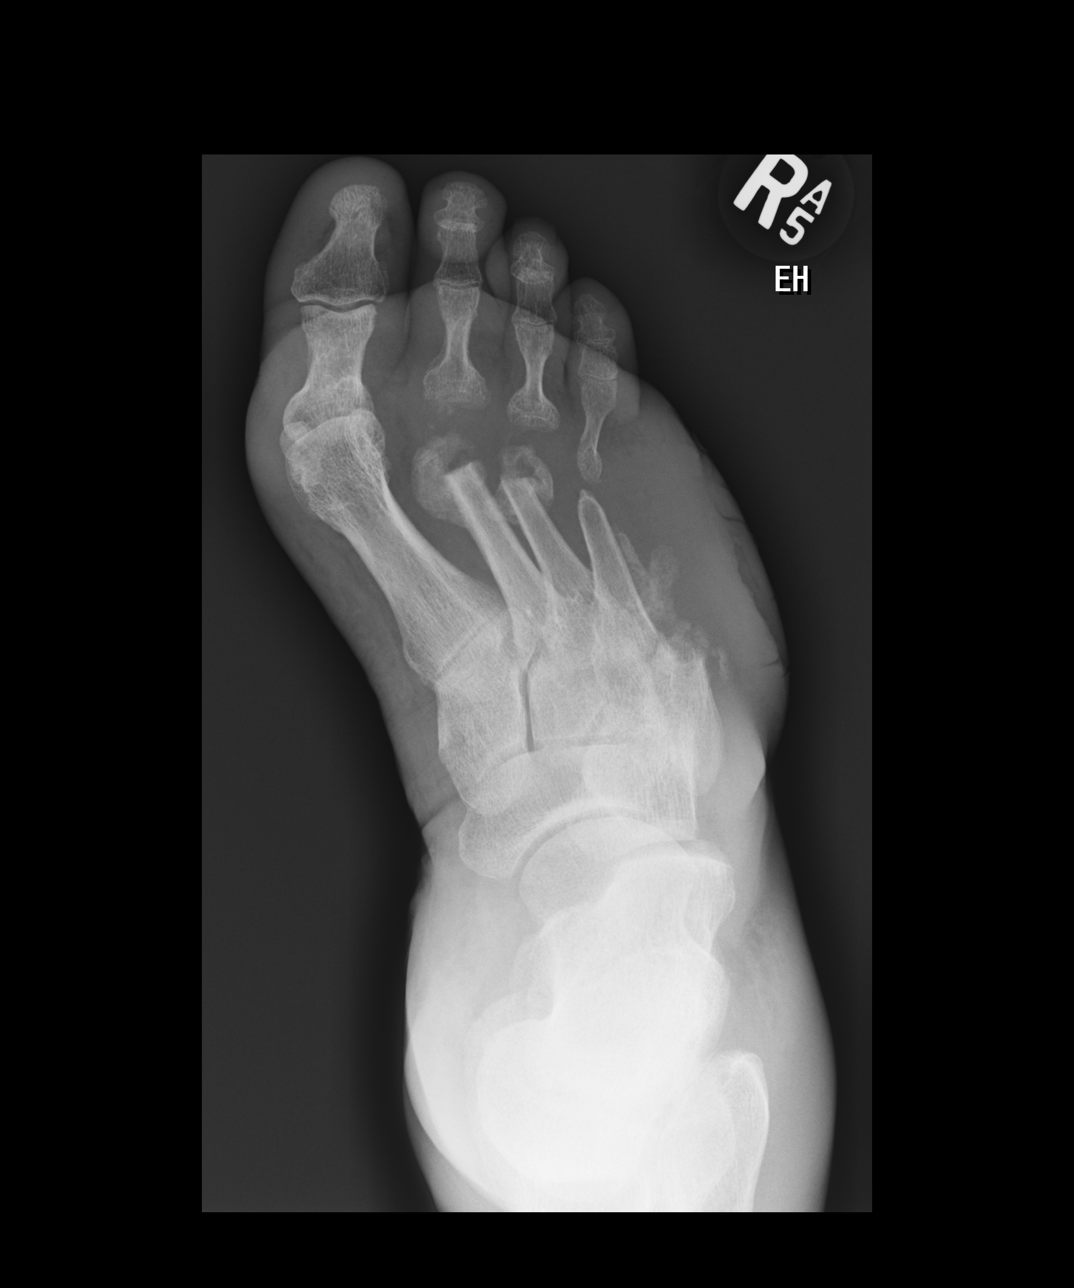

[dg foot complete right (2 of 3)]
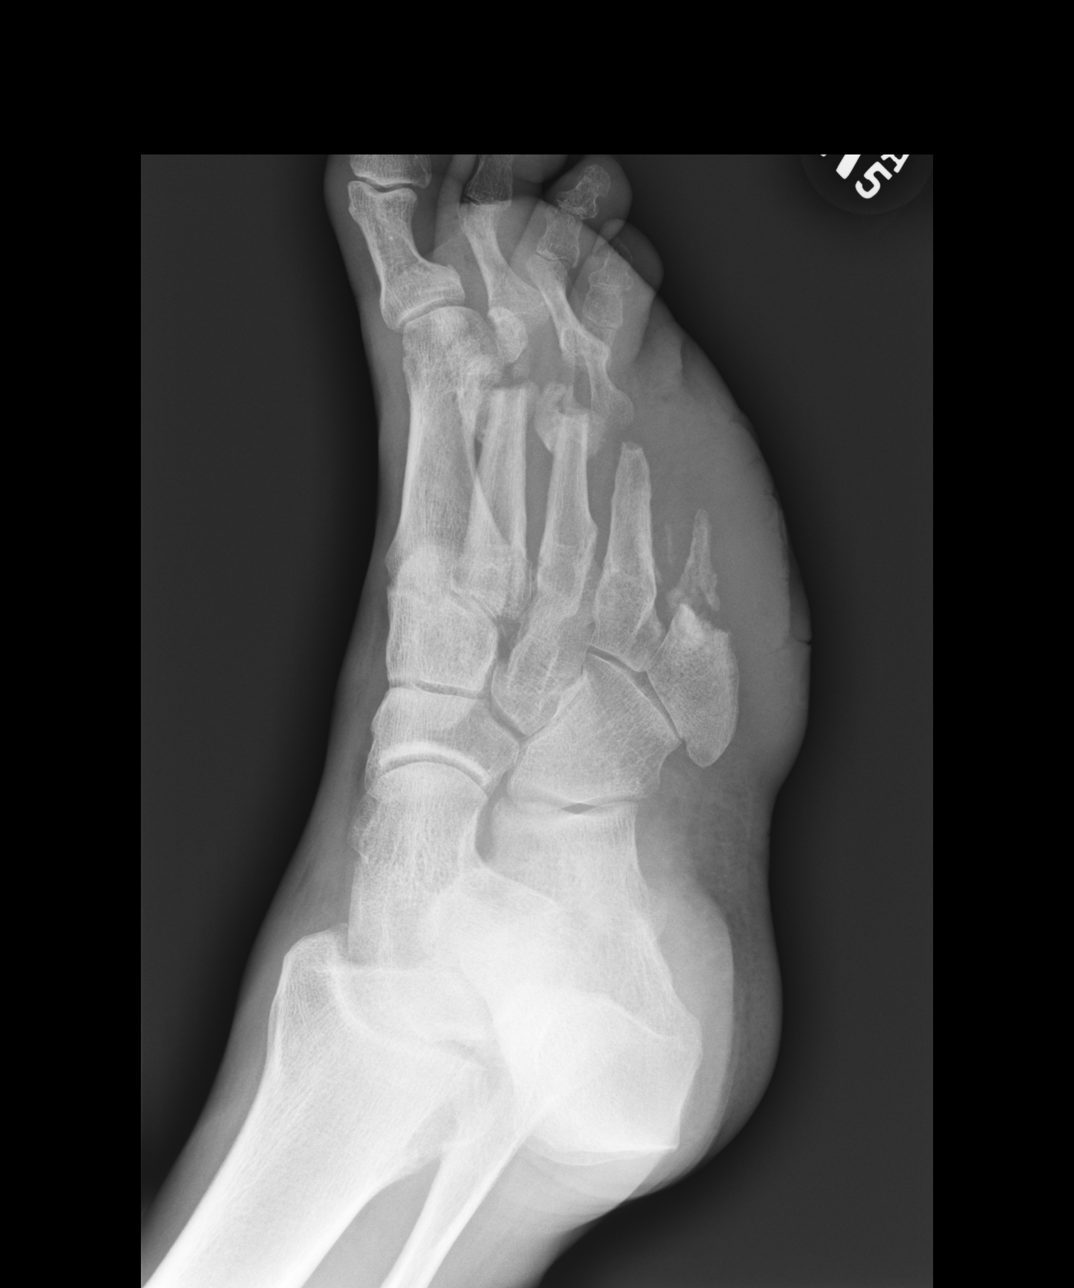

[dg foot complete right (3 of 3)]
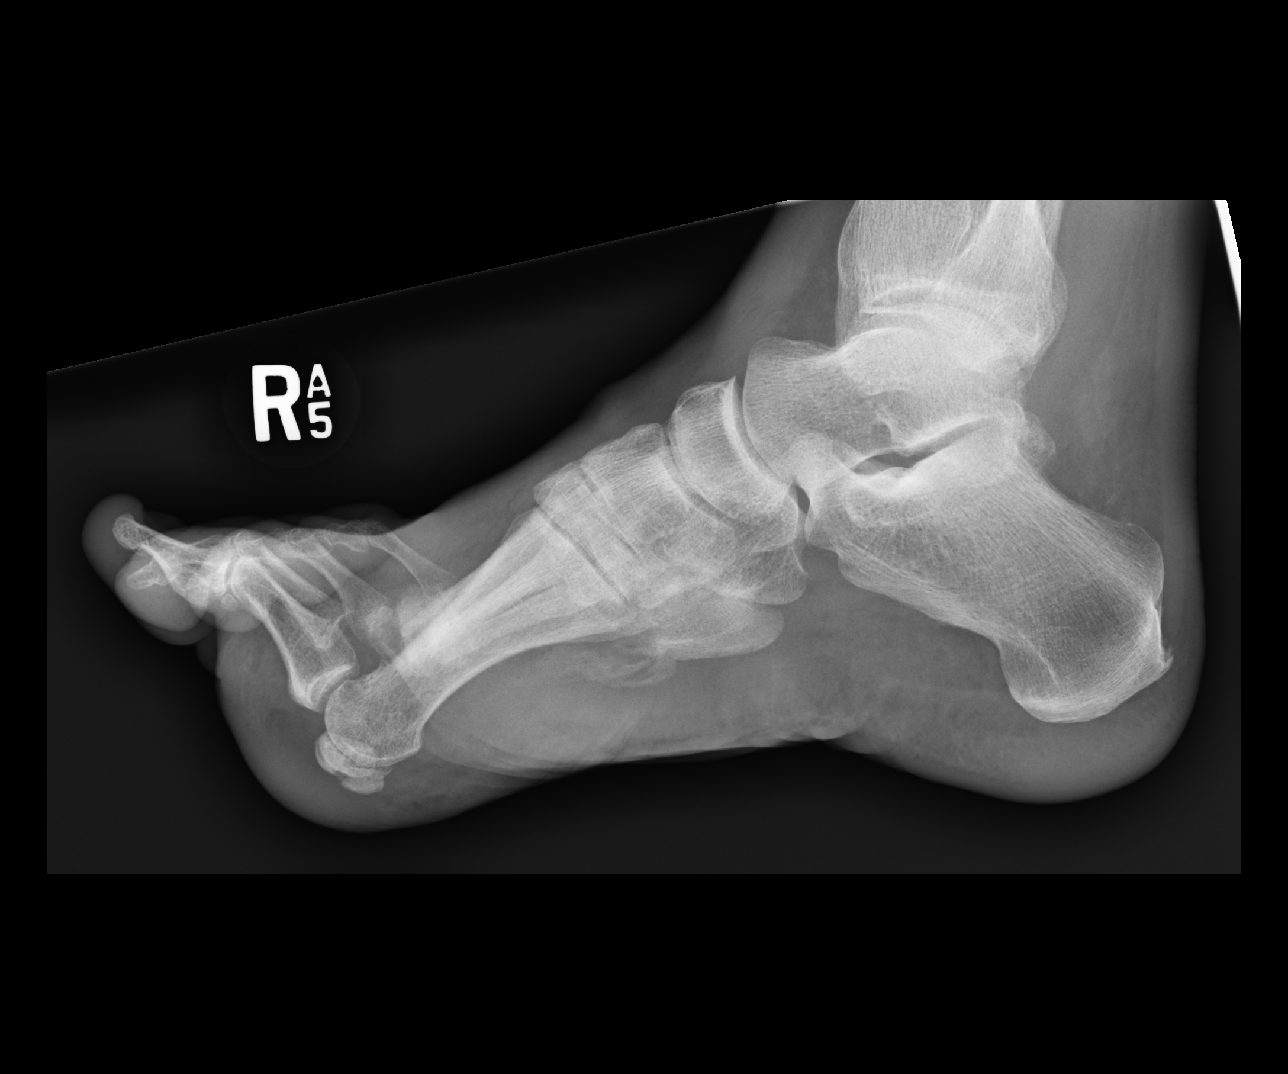

[3 of 3 positions shown; findings below may reference images not displayed]

FINDINGS: Evidence of prior amputations involving the second, third, fourth
and fifth metatarsals. Exuberant heterotopic ossification is noted
at the osteotomy sites of the second, third and fifth metatarsals
and heterotopic ossification along the lateral shaft of the fourth
metatarsal.

Mild periosteal reaction involving the second proximal phalanx.
Slight cortical irregularity. Could not exclude the possibility of
osteomyelitis. MRI may be helpful for further evaluation.

No other significant acute bony findings. No obvious destructive
bony changes to suggest osteomyelitis involving the metatarsals.

Diffuse subcutaneous soft tissue swelling/edema/fluid is noted.

Elpida Tollumi.
IMPRESSION: Postoperative changes involving the metatarsals with heterotopic
bone formation.

Subtle changes involving the second proximal phalanx. Could not
exclude the possibility of osteomyelitis. MRI may be helpful for
further evaluation.

## 2018-01-31 ENCOUNTER — Encounter: Payer: Self-pay | Admitting: Endocrinology

## 2018-01-31 ENCOUNTER — Ambulatory Visit (INDEPENDENT_AMBULATORY_CARE_PROVIDER_SITE_OTHER): Payer: Managed Care, Other (non HMO) | Admitting: Endocrinology

## 2018-01-31 VITALS — BP 158/88 | HR 84 | Ht 75.0 in | Wt 198.5 lb

## 2018-01-31 DIAGNOSIS — E1165 Type 2 diabetes mellitus with hyperglycemia: Secondary | ICD-10-CM | POA: Diagnosis not present

## 2018-01-31 DIAGNOSIS — L97509 Non-pressure chronic ulcer of other part of unspecified foot with unspecified severity: Secondary | ICD-10-CM

## 2018-01-31 DIAGNOSIS — E11621 Type 2 diabetes mellitus with foot ulcer: Secondary | ICD-10-CM

## 2018-01-31 DIAGNOSIS — Z794 Long term (current) use of insulin: Secondary | ICD-10-CM | POA: Diagnosis not present

## 2018-01-31 LAB — COMPREHENSIVE METABOLIC PANEL
ALBUMIN: 4.1 g/dL (ref 3.5–5.2)
ALK PHOS: 98 U/L (ref 39–117)
ALT: 27 U/L (ref 0–53)
AST: 17 U/L (ref 0–37)
BILIRUBIN TOTAL: 0.4 mg/dL (ref 0.2–1.2)
BUN: 21 mg/dL (ref 6–23)
CALCIUM: 9.2 mg/dL (ref 8.4–10.5)
CO2: 25 mEq/L (ref 19–32)
Chloride: 108 mEq/L (ref 96–112)
Creatinine, Ser: 1.8 mg/dL — ABNORMAL HIGH (ref 0.40–1.50)
GFR: 41.74 mL/min — ABNORMAL LOW (ref >60.00)
Glucose, Bld: 123 mg/dL — ABNORMAL HIGH (ref 70–99)
Potassium: 3.9 mEq/L (ref 3.5–5.1)
Sodium: 139 mEq/L (ref 135–145)
TOTAL PROTEIN: 6.8 g/dL (ref 6.0–8.3)

## 2018-01-31 LAB — LIPID PANEL
CHOL/HDL RATIO: 4
Cholesterol: 86 mg/dL (ref 0–200)
HDL: 23.9 mg/dL — AB (ref >39.00)
LDL Cholesterol: 22 mg/dL (ref 0–99)
NonHDL: 62.37
TRIGLYCERIDES: 200 mg/dL — AB (ref 0.0–149.0)
VLDL: 40 mg/dL (ref 0.0–40.0)

## 2018-01-31 LAB — MICROALBUMIN / CREATININE URINE RATIO
Creatinine,U: 112.5 mg/dL
MICROALB/CREAT RATIO: 4.8 mg/g (ref 0.0–30.0)
Microalb, Ur: 5.4 mg/dL — ABNORMAL HIGH (ref 0.0–1.9)

## 2018-01-31 LAB — POCT GLYCOSYLATED HEMOGLOBIN (HGB A1C): Hemoglobin A1C: 6.5 % — AB (ref 4.0–5.6)

## 2018-01-31 MED ORDER — FREESTYLE LIBRE 14 DAY SENSOR MISC: 1.0000 "application " | Freq: Once | 1 refills | Status: DC

## 2018-01-31 NOTE — Progress Notes (Signed)
Patient ID: Curtis Bradford, male   DOB: 1961-06-20, 57 y.o.   MRN: [id number]          Reason for Appointment: Consultation for Type 2 Diabetes  Referring PCP: [name], NP   History of Present Illness:          Date of diagnosis of type 2 diabetes mellitus: Probably 2006        Background history:   Initially treated with metformin He says his metformin was stopped when he had some problems with his kidney function. He was started on insulin in 2016 after failure of other drugs but details are not available. He has been mostly taking Lantus insulin However appears that he has had worsening control within the first part of 2019 after moving to the local area   Recent history:   INSULIN regimen is:  4-6 units Humalog for larger meals   38 units Lantus daily  Non-insulin hypoglycemic drugs the patient is taking are:  Current management, blood sugar patterns and problems identified:  Over the last few months he has been trying to reduce his carbohydrate intake and has been able to keep his sugars better control  With using the freestyle libre sensor he thinks he has modified his diet significantly and eliminating foods and portions that increase his blood sugars  He also has been given Tradjenta along with his insulin  He does take Humalog when he is eating larger meals or with more carbohydrate which is not daily  Currently not checking his blood sugar regularly but also he has not had consistent use of the freestyle libre sensor and is needing a new prescriptions  Generally blood sugars reviewed from his freestyle Libre sensor in the last 2 weeks has been near normal with some sporadic high readings late in the evening after meals; his overall average blood sugar is 119  However since he has had blood sugar testing data only 36% of the time on his freestyle Libre difficult to get a good blood sugar pattern, he says that he sometimes uses his phone and otherwise his  meter to check his sugar  Most of his blood sugar readings appear to be fairly steady throughout the day with mild increase after eating meal up to about 180 at times        Side effects from medications have been: None  Compliance with the medical regimen: Improved Hypoglycemia: None with only one low normal sugar recorded of 65 recently waking up  Glucose monitoring:  done variable times a day         Glucometer:  Freestyle Libre     Blood Glucose readings by time of day and averages from meter download:  Blood sugars are somewhat approximated as he has used the freestyle libre only 36% of the time  PREMEAL Breakfast Lunch Dinner Bedtime  Overall   Glucose range:       Median:  109  114  115  145  119+/-32   POST-MEAL PC Breakfast PC Lunch PC Dinner  Glucose range:    <200  Median:    163   Self-care: The diet that the patient has been following is: tries to limit starchy foods.      Typical meal intake: Breakfast is   usually bacon, eggs and grits  Usually not eating lunch Evening meal is usually low-carbohydrate           Dietician visit, most recent: 2006  Exercise:  Walking mostly at work and probably covering about 4 miles a day during the workweek  Weight history: Previous highest weight was 230  Wt Readings from Last 3 Encounters:  01/31/18 198 lb 8 oz (90 kg)  12/03/17 198 lb 8 oz (90 kg)  10/30/17 200 lb (90.7 kg)    Glycemic control:   Lab Results  Component Value Date   HGBA1C 6.5 (A) 01/31/2018   HGBA1C 9.9 (H) 10/31/2017   HGBA1C 7.0 (H) 02/15/2017   Lab Results  Component Value Date   MICROALBUR 5.4 (H) 01/31/2018   LDLCALC 22 01/31/2018   CREATININE 1.80 (H) 01/31/2018   Lab Results  Component Value Date   MICRALBCREAT 4.8 01/31/2018    No results found for: FRUCTOSAMINE    Allergies as of 01/31/2018   No Known Allergies     Medication List        Accurate as of 01/31/18 11:59 PM. Always use your most recent med list.            aspirin EC 81 MG tablet Take 81 mg by mouth at bedtime.   atorvastatin 80 MG tablet Commonly known as:  LIPITOR Take 1 tablet (80 mg total) by mouth at bedtime.   clopidogrel 75 MG tablet Commonly known as:  PLAVIX Take 1 tablet (75 mg total) by mouth at bedtime.   FREESTYLE LIBRE 14 DAY SENSOR Misc 1 application by Does not apply route once for 1 dose.   HUMALOG KWIKPEN Wiley Inject 0-15 Units into the skin 2 (two) times daily. Typically takes 4-6 units per meal. Per sliding scale   insulin glargine 100 unit/mL Sopn Commonly known as:  LANTUS Inject 37 Units into the skin at bedtime.   levETIRAcetam 1000 MG tablet Commonly known as:  KEPPRA Take 1 tablet (1,000 mg total) by mouth at bedtime.   linagliptin 5 MG Tabs tablet Commonly known as:  TRADJENTA Take 5 mg by mouth daily.   silver sulfADIAZINE 1 % cream Commonly known as:  SILVADENE Apply 1 application topically daily.       Allergies: No Known Allergies  Past Medical History:  Diagnosis Date  . CAD (coronary artery disease)   . CKD (chronic kidney disease), stage III (HCC)   . 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 DVT (deep vein thrombosis) in pregnancy (HCC)   . HLD (hyperlipidemia)   . Peripheral vascular disease (HCC)   . Seizure (HCC)   . Stroke (HCC)   . Tobacco abuse     Past Surgical History:  Procedure Laterality Date  . AMPUTATION Right 02/23/2017   Procedure: RIGHT FOOT 5TH RAY AMPUTATION;  Surgeon: [name], MD;  Location: [facility];  Service: Orthopedics;  Laterality: Right;  . FOOT SURGERY    . STENT PLACEMENT VASCULAR ([facility] HX)     Several in right leg    Family History  Problem Relation Age of Onset  . Diabetes Mellitus II Mother   . Diabetes Mellitus II Father     Social History:  reports that he has been smoking cigarettes. He has been smoking about 1.00 pack per day. He has never used smokeless tobacco. He reports that he does not drink alcohol  or use drugs.   Review of Systems  Constitutional: Negative for weight loss.  HENT: Negative for headaches.   Eyes: Negative for blurred vision.  Respiratory: Negative for shortness of breath.   Cardiovascular: Negative for leg swelling.  Gastrointestinal:  Negative for diarrhea.  Endocrine: Negative for fatigue and polydipsia.  Genitourinary: Negative for nocturia.  Musculoskeletal: Negative for joint pain.  Skin: Negative for rash.       Right great toe has previous history of infection, currently trying to use Silvadene to prevent infection  Neurological: Positive for numbness. Negative for tingling.       Has had mild numbness in his toes especially right     Lipid history: Has been treated with Lipitor 80 mg daily because of his history of CAD    Lab Results  Component Value Date   CHOL 86 01/31/2018   HDL 23.90 (L) 01/31/2018   LDLCALC 22 01/31/2018   TRIG 200.0 (H) 01/31/2018   CHOLHDL 4 01/31/2018           Hypertension: Not treated, blood pressure appears to be variable  BP Readings from Last 3 Encounters:  01/31/18 (!) 158/88  12/03/17 121/82  11/01/17 (!) 150/80    Most recent eye exam was in ?  2016  Most recent foot exam: 01/2018    LABS:  Office Visit on 01/31/2018  Component Date Value Ref Range Status  . Hemoglobin A1C 01/31/2018 6.5* 4.0 - 5.6 % Final  . Microalb, Ur 01/31/2018 5.4* 0.0 - 1.9 mg/dL Final  . Creatinine,U 16/03/9603 112.5  mg/dL Final  . Microalb Creat Ratio 01/31/2018 4.8  0.0 - 30.0 mg/g Final  . Cholesterol 01/31/2018 86  0 - 200 mg/dL Final   ATP III Classification       Desirable:  < 200 mg/dL               Borderline High:  200 - 239 mg/dL          High:  > = 540 mg/dL  . Triglycerides 01/31/2018 200.0* 0.0 - 149.0 mg/dL Final   Normal:  <981 mg/dLBorderline High:  150 - 199 mg/dL  . HDL 01/31/2018 23.90* >39.00 mg/dL Final  . VLDL 19/14/7829 40.0  0.0 - 40.0 mg/dL Final  . LDL Cholesterol 01/31/2018 22  0 - 99 mg/dL  Final  . Total CHOL/HDL Ratio 01/31/2018 4   Final                  Men          Women1/2 Average Risk     3.4          3.3Average Risk          5.0          4.42X Average Risk          9.6          7.13X Average Risk          15.0          11.0                      . NonHDL 01/31/2018 62.37   Final   NOTE:  Non-HDL goal should be 30 mg/dL higher than patient's LDL goal (i.e. LDL goal of < 70 mg/dL, would have non-HDL goal of < 100 mg/dL)  . Sodium 01/31/2018 139  135 - 145 mEq/L Final  . Potassium 01/31/2018 3.9  3.5 - 5.1 mEq/L Final  . Chloride 01/31/2018 108  96 - 112 mEq/L Final  . CO2 01/31/2018 25  19 - 32 mEq/L Final  . Glucose, Bld 01/31/2018 123* 70 - 99 mg/dL Final  . BUN 56/21/3086 21  6 - 23  mg/dL Final  . Creatinine, Ser 01/31/2018 1.80* 0.40 - 1.50 mg/dL Final  . Total Bilirubin 01/31/2018 0.4  0.2 - 1.2 mg/dL Final  . Alkaline Phosphatase 01/31/2018 98  39 - 117 U/L Final  . AST 01/31/2018 17  0 - 37 U/L Final  . ALT 01/31/2018 27  0 - 53 U/L Final  . Total Protein 01/31/2018 6.8  6.0 - 8.3 g/dL Final  . Albumin 16/03/9603 4.1  3.5 - 5.2 g/dL Final  . Calcium 54/02/8118 9.2  8.4 - 10.5 mg/dL Final  . GFR 14/78/2956 41.74* >60.00 mL/min Final    Physical Examination:  BP (!) 158/88   Pulse 84   Ht 6\' 3"  (1.905 m)   Wt 198 lb 8 oz (90 kg)   SpO2 98%   BMI 24.81 kg/m   GENERAL:         Patient has generalized obesity.    HEENT:         Eye exam shows normal external appearance.  Fundus exam shows no retinopathy.  Oral exam shows normal mucosa .  NECK:   There is no lymphadenopathy Thyroid is not enlarged and no nodules felt.  Carotids are normal to palpation and no bruit heard  LUNGS:         Chest is symmetrical. Lungs are clear to auscultation.Marland Kitchen   HEART:         Heart sounds:  S1 and S2 are normal. No murmur or click heard., no S3 or S4.   ABDOMEN:   There is no distention present. Liver and spleen are not palpable. No other mass or tenderness present.     NEUROLOGICAL:   Ankle jerks are absent bilaterally.    Diabetic Foot Exam - Simple   Simple Foot Form Diabetic Foot exam was performed with the following findings:  Yes   Visual Inspection See comments:  Yes Sensation Testing See comments:  Yes Pulse Check Posterior Tibialis and Dorsalis pulse intact bilaterally:  Yes Comments He has marked bony abnormalities of both feet with flexion of the distal toes Amputation of the right fifth toe present Has prominent bony prominence of the lateral foot on the right side Marked callus formation on the right great toe without excessive warmth or redness No ulceration Monofilament sensation absent on the first 3 toes of the right and present on the fourth toe.  Monofilament sensation decreased but present on the left toes             Vibration sense is absent in distal first toes.  MUSCULOSKELETAL:  There is no swelling or deformity of the peripheral joints.     EXTREMITIES:     There is no edema.  SKIN:       No rash or lesions of concern.        ASSESSMENT:  Diabetes type 2, insulin-dependent BMI currently 25  See history of present illness for detailed discussion of current diabetes management, blood sugar patterns and problems identified  His A1c has been behind the last couple years up to 9.9% it is now much better at 6.5 This is primarily from his using his freestyle libre sensor to monitor and modify his diet, reducing portions, carbohydrates and other foods that increase his blood sugars  Recently has not had consistent data on his freestyle libre sensor and difficult to identify any patterns However most of his sugars are close to target and available data indicates average blood sugar of under 120 with only some high readings  after evening meal He does try to take Humalog when he is eating more carbohydrate but probably not enough all times He is physically active and has maintained his weight down Not clear if he is  benefiting from Franceradjenta but he prefers not to change or consider other medications at this time  Complications of diabetes: Severe neuropathy with history of diabetic foot ulcer, apparently this is not active. Has mild decrease in sensation on the left and nearly absent on the right Likely also has Charcot foot from his exam and needs reassessment  Needs assessment of urine microalbumin and is overdue for his eye exam  VASCULAR disease: He has history of coronary artery disease and peripheral vascular disease in the right  Lipid management: His LDL is 38 with Lipitor 80 mg and continue  He does need to have regular care with primary care and other specialists as needed  History of renal impairment of unknown etiology in the past, last creatinine 1.35   PLAN:    Currently no change in his basic management is required  He does need to start checking his blood sugars consistently at all times especially postprandial  No change in Lantus since fasting readings are generally fairly good and near normal  Encouraged him to take as much as 7 or 8 units of Humalog for larger meals or eating more carbohydrate  To bring record on the next visit for review of his Freestyle CoronaLibre and to update his account online to enable in office access  Recommended that he follow-up with his orthopedic doctor for foot care and debridement of callus  Check microalbumin and renal function today  If blood pressures consistently high will need to consider adding losartan  Needs eye exam and will be referred for this  Patient Instructions  Keep sugars < 160 after meals, may take up to 8 units of Humalog at mealtimes or larger meals  Follow-up with Dr. Lajoyce Cornersuda for foot care      Consultation note has been sent to the referring physician  Reather Littlerjay Lainie Daubert 02/02/2018, 5:38 PM   Note: This office note was prepared with Dragon voice recognition system technology. Any transcriptional errors that result from  this process are unintentional.   Addendum: Urine microalbumin normal Creatinine 1.8

## 2018-01-31 NOTE — Patient Instructions (Addendum)
Keep sugars < 160 after meals, may take up to 8 units of Humalog at mealtimes or larger meals  Follow-up with Dr. Lajoyce Cornersuda for foot care

## 2018-02-02 ENCOUNTER — Encounter: Payer: Self-pay | Admitting: Endocrinology

## 2018-02-02 NOTE — Progress Notes (Signed)
Please call to let patient know that the kidney test is worse and he needs to discuss nephrology consultation with his PCP Liver function is okay and urine test normal

## 2018-02-03 ENCOUNTER — Other Ambulatory Visit: Payer: Self-pay

## 2018-02-03 ENCOUNTER — Telehealth: Payer: Self-pay | Admitting: Adult Health

## 2018-02-03 DIAGNOSIS — N289 Disorder of kidney and ureter, unspecified: Secondary | ICD-10-CM

## 2018-02-03 DIAGNOSIS — Z7689 Persons encountering health services in other specified circumstances: Secondary | ICD-10-CM

## 2018-02-03 MED ORDER — INSULIN GLARGINE 100 UNITS/ML SOLOSTAR PEN: 37.0000 [IU] | PEN_INJECTOR | Freq: Every day | SUBCUTANEOUS | 3 refills | Status: DC

## 2018-02-03 MED ORDER — INSULIN LISPRO 100 UNIT/ML (KWIKPEN): 0.0000 [IU] | PEN_INJECTOR | Freq: Two times a day (BID) | SUBCUTANEOUS | 11 refills | Status: DC

## 2018-02-03 NOTE — Telephone Encounter (Signed)
Rn call patient about referral to nephrologist. RN stated Shanda BumpsJessica NP wanted to know if he has establish with a PCP. Rn stated Shanda BumpsJessica NP gave him a list of names in June with the [facility]. PT stated he call 5 to 6 M.PT stated some of those stated they were not accepting new patients. RN stated per Shanda BumpsJessica NP is imperative he finds a PCP because of his diabetes, and recent stroke issues. RN stated Shanda BumpsJessica NP will be notified.

## 2018-02-03 NOTE — Addendum Note (Signed)
Addended by: George HughVANSCHAICK, Ferdinand Revoir on: 02/03/2018 03:41 PM   Modules accepted: Orders

## 2018-02-03 NOTE — Telephone Encounter (Addendum)
MEssage sent to Shanda BumpsJessica NP for referral for PCP in [facility].

## 2018-02-03 NOTE — Telephone Encounter (Signed)
Please call patient to notify him that he needs to obtain PCP within the next 30 days. At previous appointment in June, a list of PCP providers in the [facility] system were provided. If he has not found a PCP at this point, I will place a referral for internal medicine. Recently seen by endocrinologist for DM management which I did refer him to due to recent stroke. Lab work at that appointment showed worsening in renal function and it was recommended to place referral to nephrology. A referral will be placed to nephrology but unfortunately, I am unable to follow this patient long term for chronic conditions as I am seeing him for stroke and will not be with this practice long term. Please notify if a referral to internal medicine needs to be placed. Thank you.

## 2018-02-03 NOTE — Telephone Encounter (Signed)
Referral placed for internal medicine to establish care with PCP

## 2018-03-05 ENCOUNTER — Ambulatory Visit: Payer: Managed Care, Other (non HMO) | Admitting: Adult Health

## 2018-03-20 ENCOUNTER — Encounter (INDEPENDENT_AMBULATORY_CARE_PROVIDER_SITE_OTHER): Payer: Self-pay | Admitting: Physician Assistant

## 2018-03-20 ENCOUNTER — Ambulatory Visit (INDEPENDENT_AMBULATORY_CARE_PROVIDER_SITE_OTHER): Payer: Managed Care, Other (non HMO) | Admitting: Physician Assistant

## 2018-03-20 ENCOUNTER — Ambulatory Visit (INDEPENDENT_AMBULATORY_CARE_PROVIDER_SITE_OTHER): Payer: Managed Care, Other (non HMO)

## 2018-03-20 VITALS — Ht 75.0 in | Wt 195.5 lb

## 2018-03-20 DIAGNOSIS — Z89421 Acquired absence of other right toe(s): Secondary | ICD-10-CM

## 2018-03-20 DIAGNOSIS — L97512 Non-pressure chronic ulcer of other part of right foot with fat layer exposed: Secondary | ICD-10-CM

## 2018-03-20 DIAGNOSIS — Z794 Long term (current) use of insulin: Secondary | ICD-10-CM

## 2018-03-20 DIAGNOSIS — E1122 Type 2 diabetes mellitus with diabetic chronic kidney disease: Secondary | ICD-10-CM

## 2018-03-20 DIAGNOSIS — N182 Chronic kidney disease, stage 2 (mild): Secondary | ICD-10-CM

## 2018-03-20 DIAGNOSIS — L97509 Non-pressure chronic ulcer of other part of unspecified foot with unspecified severity: Secondary | ICD-10-CM

## 2018-03-20 DIAGNOSIS — E11621 Type 2 diabetes mellitus with foot ulcer: Secondary | ICD-10-CM

## 2018-03-21 ENCOUNTER — Encounter (INDEPENDENT_AMBULATORY_CARE_PROVIDER_SITE_OTHER): Payer: Self-pay | Admitting: Physician Assistant

## 2018-03-21 NOTE — Progress Notes (Signed)
Office Visit Note   Patient: Curtis Bradford           Date of Birth: [birth year]           MRN: [id number] Visit Date: 03/20/2018              Requested by: No referring provider defined for this encounter. PCP: Patient, No Pcp Per  Chief Complaint  Patient presents with  . Right Foot - Pain      HPI: [name] a 57 year old male who seen for his right foot.  He previously undergone a right foot fifth ray amputation for osteomyelitis in September of last year.  Since this time he is reports he got hospitalized in May as they thought he might have had a stroke.  They were also concerned that he might have cellulitis of his right foot as he had a no ulcer over his right great toe.  He has been noting some swelling over the big toe and has been applying Silvadene cream to the ulcerated area.  He reports that his right great toenail is come off several times.  Assessment & Plan: Visit Diagnoses:  1. Status post amputation of toe of right foot (HCC)   2. Skin ulcer of right great toe with fat layer exposed (HCC)   3. Type 2 diabetes mellitus with foot ulcer, with long-term current use of insulin (HCC)   4. CKD stage 2 due to type 2 diabetes mellitus (HCC)     Plan: After informed consent the right great toe ulcer was debrided of skin and soft tissue only.  There is no signs of tendon or bone visible.  There are no current signs of cellulitis.  He is going to continue with Silvadene cream to the right great toe ulcer daily after cleansing with Dial soap.  He will follow-up here in 4 weeks.  Follow-Up Instructions: Return in about 4 weeks (around 04/17/2018).   Ortho Exam  [name] alert, oriented, no adenopathy, well-dressed, normal affect, normal respiratory effort. There is a 2 cm diameter ulcer over the right great toe plantar surface.  After informed consent the right great toe ulcer was debrided with a #10 blade knife with removal of skin and soft tissue only.  There was no  visible bone or tendon.  There is no signs of cellulitis.  He has good dorsalis and posterior tibialis pulses.  There is  onychomycotic changes in the nails.  Imaging: Xr Foot 2 Views Right  Result Date: 03/21/2018 Radiographs of the right foot show the patient to be status post right fifth ray amputation.  He is also had previous remote excision of the metatarsal heads of the right foot.  There is no signs of osteomyelitis.  There are degenerative changes.  No images are attached to the encounter.  Labs: Lab Results  Component Value Date   HGBA1C 6.5 (A) 01/31/2018   HGBA1C 9.9 (H) 10/31/2017   HGBA1C 7.0 (H) 02/15/2017   ESRSEDRATE 31 (H) 02/14/2017   CRP 12.3 (H) 02/14/2017   REPTSTATUS 02/20/2017 FINAL 02/14/2017   REPTSTATUS 02/20/2017 FINAL 02/14/2017   CULT  02/14/2017    NO GROWTH 5 DAYS Performed at [facility], [address], [place], [place] [address]    CULT  02/14/2017    NO GROWTH 5 DAYS Performed at [facility], [address], [place], [place] [address]      Lab Results  Component Value Date   ALBUMIN 4.1 01/31/2018  ALBUMIN 3.7 10/30/2017   ALBUMIN 3.0 (L) 02/15/2017   PREALBUMIN 13.8 (L) 02/15/2017    Body mass index is 24.44 kg/m.  Orders:  Orders Placed This Encounter  Procedures  . XR Foot 2 Views Right   No orders of the defined types were placed in this encounter.    Procedures: No procedures performed  Clinical Data: No additional findings.  ROS:  All other systems negative, except as noted in the HPI. Review of Systems  Objective: Vital Signs: Ht 6\' 3"  (1.905 m)   Wt 195 lb 8 oz (88.7 kg)   BMI 24.44 kg/m   Specialty Comments:  No specialty comments available.  PMFS History: Patient Active Problem List   Diagnosis Date Noted  . TIA (transient ischemic attack) 10/30/2017  . Medication monitoring encounter 03/28/2017  . Status post amputation of toe of right foot (HCC) 03/05/2017  . Subacute  osteomyelitis, right ankle and foot (HCC)   . Peripheral neuropathy 02/16/2017  . Peripheral artery disease (HCC) 02/16/2017  . 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 Diabetic foot infection (HCC) 02/14/2017  . Type II diabetes mellitus with renal manifestations (HCC)   . HLD (hyperlipidemia)   . Tobacco abuse   . CKD stage 2 due to type 2 diabetes mellitus (HCC)   . CAD (coronary artery disease)   . Seizure disorder [facility])    Past Medical History:  Diagnosis Date  . CAD (coronary artery disease)   . CKD (chronic kidney disease), stage III (HCC)   . 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 History of DVT (deep vein thrombosis)   . HLD (hyperlipidemia)   . Peripheral vascular disease (HCC)   . Seizure (HCC)   . Stroke (HCC)   . Tobacco abuse     Family History  Problem Relation Age of Onset  . Diabetes Mellitus II Mother   . Diabetes Mellitus II Father     Past Surgical History:  Procedure Laterality Date  . AMPUTATION Right 02/23/2017   Procedure: RIGHT FOOT 5TH RAY AMPUTATION;  Surgeon: [name], MD;  Location: [facility] OR;  Service: Orthopedics;  Laterality: Right;  . FOOT SURGERY    . STENT PLACEMENT VASCULAR ([facility] HX)     Several in right leg   Social History   Occupational History  . Not on file  Tobacco Use  . Smoking status: Current Some Day Smoker    Packs/day: 1.00    Types: Cigarettes  . Smokeless tobacco: Never Used  . Tobacco comment: pack or less a week/ Occ  Substance and Sexual Activity  . Alcohol use: No    Comment: seldom  . Drug use: No  . Sexual activity: Not on file

## 2018-04-03 ENCOUNTER — Ambulatory Visit: Payer: Managed Care, Other (non HMO) | Admitting: Adult Health

## 2018-04-03 ENCOUNTER — Encounter: Payer: Self-pay | Admitting: Adult Health

## 2018-04-03 ENCOUNTER — Telehealth: Payer: Self-pay | Admitting: Endocrinology

## 2018-04-03 ENCOUNTER — Other Ambulatory Visit: Payer: Self-pay

## 2018-04-03 ENCOUNTER — Other Ambulatory Visit: Payer: Self-pay | Admitting: Endocrinology

## 2018-04-03 MED ORDER — FREESTYLE LIBRE 14 DAY SENSOR MISC: 1.0000 | 2 refills | Status: DC

## 2018-04-03 NOTE — Progress Notes (Deleted)
[facility] [address] [place] [address] [phone]       OFFICE FOLLOW UP NOTE  [name] Date of Birth:  Aug 28, 1960 Medical Record Number:  [id number]   Reason for Referral:  hospital stroke follow up  CHIEF COMPLAINT:  No chief complaint on file.   HPI: Curtis Bradford is being seen today for initial visit in the office for left thalamic infarct on 10/30/2017. History obtained from patient and chart review. Reviewed all radiology images and labs personally.  Patient 57 year-old male with history of CAD/MI in 2009, CKD, DM, current smoker, PVD with multiple stents, HLD, one-time seizure due to metformin overdose, left MCA stroke in 2012 who was admitted for right-sided numbness for 4 days.  CT head showed left parietal MCA old infarct, left thalamic subacute infarct.  MRI showed acute left thalamic infarct and old left MCA and right thalamus infarct.  MRA unremarkable.  CTA neck with mild arthrosclerosis but no significant stenosis.  2D echo showed an EF of 55 to 60%.  LDL satisfactory at 38.  A1c elevated at 9.9.  Patient had stroke in 2012 with sudden onset aphasia.  Admitted to hospital in [place].    It was unsure about a stroke work-up over there, however patient stated that he was not told any etiology for the stroke.  It to him 6 months to get speech back.  He also had MI in 2009, and multiple lower extremity stent for PVD. In 2016, patient did have one seizure after accidentally overdosing on metformin and since discontinuation of metformin, he denies seizure activity but has continued on Keppra.  Patient does have a history of right leg DVT 5 years ago and eventually received 2 stents that were subsequently clogged, unclogged, and clogged then additional 3 stents were placed/replaced.  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  Given history of embolic  pattern stroke in 2012, it was decided to further work-up with a TCD bubble study which was negative for PFO, lower extremity venous Dopplers were negative for DVT and hypercoagulable work-up negative.  Recommended increasing Lipitor 20 mg to 80 mg for cholesterol control.  Also recommended close outpatient follow-up with PCP regarding elevated A1c level.  Patient was advised to quit smoking as he is an active smoker.  Patient discharged home in stable condition.    12/03/2017 visit: [name] being seen today for hospital follow-up.  He continues to have complaints of right-sided numbness along with facial numbness but has been improving.  He states when he first was discharged from the hospital he was unable to pull something out of his pocket as he was unable to feel anything but now he is able to see on object and pull it out.  He continues to take both aspirin and Plavix with mild bruising but no bleeding.  Continues to take Lipitor without side effects of myalgias.  Blood pressure satisfactory 121/82.Patient has returned to all previous activities including work and driving.   He does have recent complaint of ringing sensation in his right ear along with coughing spells that has been occurring for approximately 2 weeks with mild postnasal drip.  He is using Mucinex DM which has been helping some.   Patient moved to the [place] area from [place] last October and has not found a PCP at this time.  Patient does have many  medications for diabetic control.  Denies new or worsening stroke/TIA symptoms.  Interval history 04/03/2018:    ROS:   14 system review of systems performed and negative with exception of ringing in ears, headache, numbness, weakness, sleepiness  PMH:  Past Medical History:  Diagnosis Date  . CAD (coronary artery disease)   . CKD (chronic kidney disease), stage III (HCC)   . 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 History of DVT (deep vein  thrombosis)   . HLD (hyperlipidemia)   . Peripheral vascular disease (HCC)   . Seizure (HCC)   . Stroke (HCC)   . Tobacco abuse     PSH:  Past Surgical History:  Procedure Laterality Date  . AMPUTATION Right 02/23/2017   Procedure: RIGHT FOOT 5TH RAY AMPUTATION;  Surgeon: [name], MD;  Location: [facility] OR;  Service: Orthopedics;  Laterality: Right;  . FOOT SURGERY    . STENT PLACEMENT VASCULAR ([facility] HX)     Several in right leg    Social History:  Social History   Socioeconomic History  . Marital status: Unknown    Spouse name: Not on file  . Number of children: Not on file  . Years of education: Not on file  . Highest education level: Not on file  Occupational History  . Not on file  Social Needs  . Financial resource strain: Not on file  . Food insecurity:    Worry: Not on file    Inability: Not on file  . Transportation needs:    Medical: Not on file    Non-medical: Not on file  Tobacco Use  . Smoking status: Current Some Day Smoker    Packs/day: 1.00    Types: Cigarettes  . Smokeless tobacco: Never Used  . Tobacco comment: pack or less a week/ Occ  Substance and Sexual Activity  . Alcohol use: No    Comment: seldom  . Drug use: No  . Sexual activity: Not on file  Lifestyle  . Physical activity:    Days per week: Not on file    Minutes per session: Not on file  . Stress: Not on file  Relationships  . Social connections:    Talks on phone: Not on file    Gets together: Not on file    Attends religious service: Not on file    Active member of club or organization: Not on file    Attends meetings of clubs or organizations: Not on file    Relationship status: Not on file  . Intimate partner violence:    Fear of current or ex partner: Not on file    Emotionally abused: Not on file    Physically abused: Not on file    Forced sexual activity: Not on file  Other Topics Concern  . Not on file  Social History Narrative  . Not on file    Family  History:  Family History  Problem Relation Age of Onset  . Diabetes Mellitus II Mother   . Diabetes Mellitus II Father     Medications:   Current Outpatient Medications on File Prior to Visit  Medication Sig Dispense Refill  . aspirin EC 81 MG tablet Take 81 mg by mouth at bedtime.    Curtis Bradford Kitchen atorvastatin (LIPITOR) 80 MG tablet Take 1 tablet (80 mg total) by mouth at bedtime. 90 tablet 4  . clopidogrel (PLAVIX) 75 MG tablet Take 1 tablet (75 mg total) by mouth at bedtime. 90  tablet 4  . Continuous Blood Gluc Sensor (FREESTYLE LIBRE 14 DAY SENSOR) MISC 1 application by Does not apply route once for 1 dose. 2 each 1  . insulin glargine (LANTUS) 100 unit/mL SOPN Inject 0.37 mLs (37 Units total) into the skin at bedtime. 15 mL 3  . insulin lispro (HUMALOG KWIKPEN) 100 UNIT/ML KiwkPen Inject 0-0.15 mLs (0-15 Units total) into the skin 2 (two) times daily. Typically takes 4-6 units per meal. Per sliding scale 15 mL 11  . levETIRAcetam (KEPPRA) 1000 MG tablet Take 1 tablet (1,000 mg total) by mouth at bedtime. 90 tablet 4  . linagliptin (TRADJENTA) 5 MG TABS tablet Take 5 mg by mouth daily.    . silver sulfADIAZINE (SILVADENE) 1 % cream Apply 1 application topically daily. 50 g 1   No current facility-administered medications on file prior to visit.     Allergies:  No Known Allergies   Physical Exam  There were no vitals filed for this visit. There is no height or weight on file to calculate BMI. No exam data present  General: well developed, pleasant middle-aged Caucasian male, well nourished, seated, in no evident distress Head: head normocephalic and atraumatic.   Neck: supple with no carotid or supraclavicular bruits Cardiovascular: regular rate and rhythm, no murmurs Musculoskeletal: no deformity Skin:  no rash/petichiae, mild amount of ecchymosis Vascular:  Normal pulses all extremities  Neurologic Exam Mental Status: Awake and fully alert. Oriented to place and time. Recent and  remote memory intact. Attention span, concentration and fund of knowledge appropriate. Mood and affect appropriate.  Cranial Nerves: Fundoscopic exam reveals sharp disc margins. Pupils equal, briskly reactive to light. Extraocular movements full without nystagmus. Visual fields full to confrontation. Hearing intact. Facial sensation intact. Face, tongue, palate moves normally and symmetrically.  Motor: Normal bulk and tone. Normal strength in all tested extremity muscles. Sensory.:  Decreased sensation bilateral lower extremities distally along with decreased sensation on right upper and lower extremity Coordination: Rapid alternating movements normal in all extremities. Finger-to-nose and heel-to-shin performed accurately bilaterally. Gait and Station: Arises from chair without difficulty. Stance is normal. Gait demonstrates normal stride length and balance . Able to heel, toe and tandem walk without difficulty.  Reflexes: 1+ and symmetric. Toes downgoing.    NIHSS  1 Modified Rankin  1    Diagnostic Data (Labs, Imaging, Testing)  CT HEAD WO CONTRAST 10/30/2017 IMPRESSION: Old left parietal and thalamic infarcts without acute intracranial Abnormality.  MR BRAIN WO CONTRAST MR MRA HEAD WO CONTRAST 10/31/2017 IMPRESSION: MRI HEAD IMPRESSION 1. 9 mm acute ischemic nonhemorrhagic left thalamic lacunar infarct. 2. Remote left posterior MCA territory infarct, with additional remote lacunar infarcts involving the bilateral thalami and right basal ganglia. 3. Atrophy with mild chronic small vessel ischemic disease. MRA HEAD IMPRESSION 1. Negative intracranial MRA. No large vessel occlusion. No hemodynamically significant or correctable stenosis. 2. Minor atherosclerotic change for age.  CT ANGIO NECK W OR WO CONTRAST 10/31/2017 IMPRESSION: 1. Atherosclerosis without flow limiting stenosis or ulceration. 2. 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  ECHOCARDIOGRAM 10/31/17 Study  Conclusions - Left ventricle: The cavity size was normal. Systolic function was   normal. The estimated ejection fraction was in the range of 55%   to 60%. Hypokinesis of the apical myocardium. Doppler parameters   are consistent with abnormal left ventricular relaxation (grade 1   diastolic dysfunction). - Aortic valve: Trileaflet; mildly thickened, mildly calcified   leaflets. - Aortic root: The aortic root was mildly dilated. -  Mitral valve: Valve area by pressure half-time: 2.44 cm^2.  VAS [place] LOWER EXTREMITY VENOUS BILAT (DVT) 11/01/17 Final Interpretation: Right: There is no evidence of deep vein thrombosis in the lower extremity. No cystic structure found in the popliteal fossa. Left: There is no evidence of deep vein thrombosis in the lower extremity. No cystic structure found in the popliteal fossa.  VAS [place] TRANSCRANIAL DOPPLER WITH BUBBLES 11/01/17 There is no obvious evidence of high intensity transient signals (HITS), therefore there is no evidence of patent foramen ovale (PFO). Artifact was seen with valsalva maneuver, possible muscular interference.     ASSESSMENT: Dimitrius Steedman is a 57 y.o. year old male here with acute left thalamic infarct on 10/30/17 secondary to small vessel disease. Vascular risk factors include CKD, DM, PVD, HLD and HTN.    PLAN: -Continue aspirin 81 mg daily and clopidogrel 75 mg daily  and Lipitor for secondary stroke prevention -Provided patient with list of PCP within [facility] system in the [place] area to follow-up for management of HTN, HLD, PVD and DM management -Referral placed for endocrinologist for diabetes management -continue to monitor BP at home -Consider weaning from Keppra at follow-up appointment -Maintain strict control of hypertension with blood pressure goal below 130/90, diabetes with hemoglobin A1c goal below 6.5% and cholesterol with LDL cholesterol (bad cholesterol) goal below 70 mg/dL. I also advised the patient to eat a healthy  diet with plenty of whole grains, cereals, fruits and vegetables, exercise regularly and maintain ideal body weight.  Follow up in 3 months or call earlier if needed   Greater than 50% of time during this 25 minute visit was spent on counseling,explanation of diagnosis of left thalamic infarct, reviewing risk factor management of CKD, DM, PVD, HLD and HTN, planning of further management, discussion with patient and family and coordination of care   [name], [facility]  [facility] [address] [place], [place] [address]  Phone [phone] Fax [phone]

## 2018-04-03 NOTE — Telephone Encounter (Signed)
This has been sent

## 2018-04-03 NOTE — Telephone Encounter (Signed)
Pt needs a refill on the freestyle libre sensors. Wants to know if he can get a 90 day supply.

## 2018-04-09 NOTE — Progress Notes (Signed)
[facility] [address] [place]. [place] [address] [phone]       OFFICE FOLLOW UP NOTE  [name] Date of Birth:  09/06/1960 Medical Record Number:  [id number]   Reason for visit: Stroke follow up  CHIEF COMPLAINT:  Chief Complaint  Patient presents with  . Follow-up    Stroke Seizure  follow up room in backhallway patient alone    HPI: Curtis Bradford is being seen today in the office for left thalamic infarct on 10/30/2017. History obtained from patient and chart review. Reviewed all radiology images and labs personally.  Patient 57 year-old male with history of CAD/MI in 2009, CKD, DM, current smoker, PVD with multiple stents, HLD, one-time seizure due to metformin overdose, left MCA stroke in 2012 who was admitted for right-sided numbness for 4 days.  CT head showed left parietal MCA old infarct, left thalamic subacute infarct.  MRI showed acute left thalamic infarct and old left MCA and right thalamus infarct.  MRA unremarkable.  CTA neck with mild arthrosclerosis but no significant stenosis.  2D echo showed an EF of 55 to 60%.  LDL satisfactory at 38.  A1c elevated at 9.9.  Patient had stroke in 2012 with sudden onset aphasia.  Admitted to hospital in [place].    It was unsure about a stroke work-up over there, however patient stated that he was not told any etiology for the stroke.  It to him 6 months to get speech back.  He also had MI in 2009, and multiple lower extremity stent for PVD. In 2016, patient did have one seizure after accidentally overdosing on metformin and since discontinuation of metformin, he denies seizure activity but has continued on Keppra.  Patient does have a history of right leg DVT 5 years ago and eventually received 2 stents that were subsequently clogged, unclogged, and clogged then additional 3 stents were placed/replaced.  As a result of PVD and likely poorly controlled diabetes this led to a right foot wound and eventual  amputation of the right fifth toe.  Given history of embolic pattern stroke in 2012, it was decided to further work-up with a TCD bubble study which was negative for PFO, lower extremity venous Dopplers were negative for DVT and hypercoagulable work-up negative.  Recommended increasing Lipitor 20 mg to 80 mg for cholesterol control.  Also recommended close outpatient follow-up with PCP regarding elevated A1c level.  Patient was advised to quit smoking as he is an active smoker.  Patient discharged home in stable condition.    12/03/2017 visit: [name] being seen today for hospital follow-up.  He continues to have complaints of right-sided numbness along with facial numbness but has been improving.  He states when he first was discharged from the hospital he was unable to pull something out of his pocket as he was unable to feel anything but now he is able to see on object and pull it out.  He continues to take both aspirin and Plavix with mild bruising but no bleeding.  Continues to take Lipitor without side effects of myalgias.  Blood pressure satisfactory 121/82.Patient has returned to all previous activities including work and driving.   He does have recent complaint of ringing sensation in his right ear along with coughing spells that has been occurring for approximately 2 weeks with mild postnasal drip.  He is using Mucinex DM which has been helping some.   Patient moved to the [place] area from [place] last October and  has not found a PCP at this time.  Patient does have many medications for diabetic control.  Denies new or worsening stroke/TIA symptoms.  Interval history 04/09/2018: Patient returns today for scheduled follow-up visit.  He has been doing well from a stroke standpoint without residual deficits or recurring of symptoms.  He has continued taking both aspirin and Plavix without side effects of bleeding or bruising.  Continues to take Lipitor without side effects myalgias.  He was  seen by endocrinology who did lab work on 01/31/2018 which showed LDL 22, cholesterol 86, triglycerides 200 and HDL 23.9.  Also A1c obtained which showed 6.5.  He continues to work without complication and continues to do all prior activities.  Blood pressure today 140/70.  Referral was placed for nephrology as well as internal medicine for PCP establishment but has not received a call from either offices.  Patient was provided with list of PCP offices in the [place] area along with phone number to contact nephrology due to continued elevated creatinine level.  Patient denies any recent seizure activity and continues to be compliant with Keppra thousand milligrams nightly.  Patient questioning whether this can start to be tapered off.  No further concerns at this time.  Denies new or worsening stroke/TIA symptoms.   ROS:   14 system review of systems performed and negative with exception of no complaints  PMH:  Past Medical History:  Diagnosis Date  . CAD (coronary artery disease)   . CKD (chronic kidney disease), stage III (HCC)   . 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 History of DVT (deep vein thrombosis)   . HLD (hyperlipidemia)   . Peripheral vascular disease (HCC)   . Seizure (HCC)   . Stroke (HCC)   . Tobacco abuse     PSH:  Past Surgical History:  Procedure Laterality Date  . AMPUTATION Right 02/23/2017   Procedure: RIGHT FOOT 5TH RAY AMPUTATION;  Surgeon: [name], MD;  Location: [facility] OR;  Service: Orthopedics;  Laterality: Right;  . FOOT SURGERY    . STENT PLACEMENT VASCULAR ([facility] HX)     Several in right leg    Social History:  Social History   Socioeconomic History  . Marital status: Unknown    Spouse name: Not on file  . Number of children: Not on file  . Years of education: Not on file  . Highest education level: Not on file  Occupational History  . Not on file  Social Needs  . Financial resource strain: Not on file  . Food insecurity:     Worry: Not on file    Inability: Not on file  . Transportation needs:    Medical: Not on file    Non-medical: Not on file  Tobacco Use  . Smoking status: Current Some Day Smoker    Packs/day: 1.00    Types: Cigarettes  . Smokeless tobacco: Never Used  . Tobacco comment: pack or less a week/ Occ  Substance and Sexual Activity  . Alcohol use: No    Comment: seldom  . Drug use: No  . Sexual activity: Not on file  Lifestyle  . Physical activity:    Days per week: Not on file    Minutes per session: Not on file  . Stress: Not on file  Relationships  . Social connections:    Talks on phone: Not on file    Gets together: Not on file    Attends religious service:  Not on file    Active member of club or organization: Not on file    Attends meetings of clubs or organizations: Not on file    Relationship status: Not on file  . Intimate partner violence:    Fear of current or ex partner: Not on file    Emotionally abused: Not on file    Physically abused: Not on file    Forced sexual activity: Not on file  Other Topics Concern  . Not on file  Social History Narrative  . Not on file    Family History:  Family History  Problem Relation Age of Onset  . Diabetes Mellitus II Mother   . Diabetes Mellitus II Father     Medications:   Current Outpatient Medications on File Prior to Visit  Medication Sig Dispense Refill  . aspirin EC 81 MG tablet Take 81 mg by mouth at bedtime.    . clopidogrel (PLAVIX) 75 MG tablet Take 1 tablet (75 mg total) by mouth at bedtime. 90 tablet 4  . Continuous Blood Gluc Sensor (FREESTYLE LIBRE 14 DAY SENSOR) MISC Place 1 patch onto the skin every 14 (fourteen) days. 6 each 2  . insulin glargine (LANTUS) 100 unit/mL SOPN Inject 0.37 mLs (37 Units total) into the skin at bedtime. 15 mL 3  . insulin lispro (HUMALOG KWIKPEN) 100 UNIT/ML KiwkPen Inject 0-0.15 mLs (0-15 Units total) into the skin 2 (two) times daily. Typically takes 4-6 units per meal.  Per sliding scale 15 mL 11  . linagliptin (TRADJENTA) 5 MG TABS tablet Take 5 mg by mouth daily.    . silver sulfADIAZINE (SILVADENE) 1 % cream Apply 1 application topically daily. 50 g 1   No current facility-administered medications on file prior to visit.     Allergies:  No Known Allergies   Physical Exam  Vitals:   04/10/18 0726  BP: (!) 148/78  Pulse: 88  Weight: 204 lb 9.6 oz (92.8 kg)  Height: 6\' 3"  (1.905 m)   Body mass index is 25.57 kg/m. No exam data present  General: well developed, pleasant middle-aged Caucasian male, well nourished, seated, in no evident distress Head: head normocephalic and atraumatic.   Neck: supple with no carotid or supraclavicular bruits Cardiovascular: regular rate and rhythm, no murmurs Musculoskeletal: no deformity Skin:  no rash/petichiae Vascular:  Normal pulses all extremities  Neurologic Exam Mental Status: Awake and fully alert. Oriented to place and time. Recent and remote memory intact. Attention span, concentration and fund of knowledge appropriate. Mood and affect appropriate.  Cranial Nerves: Fundoscopic exam reveals sharp disc margins. Pupils equal, briskly reactive to light. Extraocular movements full without nystagmus. Visual fields full to confrontation. Hearing intact. Facial sensation intact. Face, tongue, palate moves normally and symmetrically.  Motor: Normal bulk and tone. Normal strength in all tested extremity muscles. Sensory.:  Decreased sensation bilateral lower extremities distally along with decreased sensation on right upper and lower extremity Coordination: Rapid alternating movements normal in all extremities. Finger-to-nose and heel-to-shin performed accurately bilaterally. Gait and Station: Arises from chair without difficulty. Stance is normal. Gait demonstrates normal stride length and balance . Able to heel, toe and tandem walk without difficulty.  Reflexes: 1+ and symmetric. Toes downgoing.       Diagnostic Data (Labs, Imaging, Testing)  CT HEAD WO CONTRAST 10/30/2017 IMPRESSION: Old left parietal and thalamic infarcts without acute intracranial Abnormality.  MR BRAIN WO CONTRAST MR MRA HEAD WO CONTRAST 10/31/2017 IMPRESSION: MRI HEAD IMPRESSION 1. 9 mm acute  ischemic nonhemorrhagic left thalamic lacunar infarct. 2. Remote left posterior MCA territory infarct, with additional remote lacunar infarcts involving the bilateral thalami and right basal ganglia. 3. Atrophy with mild chronic small vessel ischemic disease. MRA HEAD IMPRESSION 1. Negative intracranial MRA. No large vessel occlusion. No hemodynamically significant or correctable stenosis. 2. Minor atherosclerotic change for age.  CT ANGIO NECK W OR WO CONTRAST 10/31/2017 IMPRESSION: 1. Atherosclerosis without flow limiting stenosis or ulceration. 2. 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  ECHOCARDIOGRAM 10/31/17 Study Conclusions - Left ventricle: The cavity size was normal. Systolic function was   normal. The estimated ejection fraction was in the range of 55%   to 60%. Hypokinesis of the apical myocardium. Doppler parameters   are consistent with abnormal left ventricular relaxation (grade 1   diastolic dysfunction). - Aortic valve: Trileaflet; mildly thickened, mildly calcified   leaflets. - Aortic root: The aortic root was mildly dilated. - Mitral valve: Valve area by pressure half-time: 2.44 cm^2.  VAS [place] LOWER EXTREMITY VENOUS BILAT (DVT) 11/01/17 Final Interpretation: Right: There is no evidence of deep vein thrombosis in the lower extremity. No cystic structure found in the popliteal fossa. Left: There is no evidence of deep vein thrombosis in the lower extremity. No cystic structure found in the popliteal fossa.  VAS [place] TRANSCRANIAL DOPPLER WITH BUBBLES 11/01/17 There is no obvious evidence of high intensity transient signals (HITS), therefore there is no evidence of patent foramen ovale (PFO).  Artifact was seen with valsalva maneuver, possible muscular interference.     ASSESSMENT: Curtis Bradford is a 57 y.o. year old male here with acute left thalamic infarct on 10/30/17 secondary to small vessel disease. Vascular risk factors include CKD, DM, PVD, HLD and HTN.  Patient returns today for follow-up visit and overall continues to do well with no residual deficits or recurring of symptoms.   PLAN: -Continue aspirin 81 mg daily and clopidogrel 75 mg daily  and decrease atorvastatin from 80 mg to 20 mg and start Zetia 10 mg for secondary stroke prevention -Decrease Keppra from 1000mg  nightly to 500 mg nightly -Provided patient with list of PCP within [facility] system in the [place] area to follow-up for management of HTN, HLD, PVD and DM management -Nephrologist for number provided to patient to call office to schedule appointment for creatinine monitoring -continue to monitor BP at home -Advised to continue to stay active and maintain a healthy diet -Maintain strict control of hypertension with blood pressure goal below 130/90, diabetes with hemoglobin A1c goal below 6.5% and cholesterol with LDL cholesterol (bad cholesterol) goal below 70 mg/dL. I also advised the patient to eat a healthy diet with plenty of whole grains, cereals, fruits and vegetables, exercise regularly and maintain ideal body weight.  Follow up in 3 months or call earlier if needed   Greater than 50% of time during this 25 minute visit was spent on counseling,explanation of diagnosis of left thalamic infarct, reviewing risk factor management of CKD, DM, PVD, HLD and HTN, planning of further management, discussion with patient and family and coordination of care   [name], [facility]  [facility] [address] [place], [place] [address]  Phone [phone] Fax [phone]

## 2018-04-10 ENCOUNTER — Ambulatory Visit (INDEPENDENT_AMBULATORY_CARE_PROVIDER_SITE_OTHER): Payer: Managed Care, Other (non HMO) | Admitting: Adult Health

## 2018-04-10 ENCOUNTER — Encounter: Payer: Self-pay | Admitting: Adult Health

## 2018-04-10 VITALS — BP 148/78 | HR 88 | Ht 75.0 in | Wt 204.6 lb

## 2018-04-10 DIAGNOSIS — E785 Hyperlipidemia, unspecified: Secondary | ICD-10-CM | POA: Diagnosis not present

## 2018-04-10 DIAGNOSIS — Z794 Long term (current) use of insulin: Secondary | ICD-10-CM

## 2018-04-10 DIAGNOSIS — I63512 Cerebral infarction due to unspecified occlusion or stenosis of left middle cerebral artery: Secondary | ICD-10-CM

## 2018-04-10 DIAGNOSIS — E1122 Type 2 diabetes mellitus with diabetic chronic kidney disease: Secondary | ICD-10-CM | POA: Diagnosis not present

## 2018-04-10 DIAGNOSIS — I10 Essential (primary) hypertension: Secondary | ICD-10-CM

## 2018-04-10 DIAGNOSIS — N182 Chronic kidney disease, stage 2 (mild): Secondary | ICD-10-CM

## 2018-04-10 MED ORDER — ATORVASTATIN CALCIUM 20 MG PO TABS: 20.0000 mg | ORAL_TABLET | Freq: Every day | ORAL | 3 refills | Status: DC

## 2018-04-10 MED ORDER — LEVETIRACETAM ER 500 MG PO TB24: 500.0000 mg | ORAL_TABLET | Freq: Every day | ORAL | 4 refills | Status: DC

## 2018-04-10 MED ORDER — EZETIMIBE 10 MG PO TABS: 10.0000 mg | ORAL_TABLET | Freq: Every day | ORAL | 4 refills | Status: DC

## 2018-04-10 NOTE — Patient Instructions (Signed)
Continue aspirin 81 mg daily and clopidogrel 75 mg daily  and decrease lipitor to 20mg  and start Zetia to help get triglycerides down  for secondary stroke prevention  Decrease keppra from 1000mg  at bedtime to 500mg  at bedtime  Call a primary care provided on the list provided to establish yourself for care  Continue to stay active and maintain a healthy diet  Continue to monitor blood pressure at home  Maintain strict control of hypertension with blood pressure goal below 130/90, diabetes with hemoglobin A1c goal below 6.5% and cholesterol with LDL cholesterol (bad cholesterol) goal below 70 mg/dL. I also advised the patient to eat a healthy diet with plenty of whole grains, cereals, fruits and vegetables, exercise regularly and maintain ideal body weight.  Followup in the future with me in 3 months or call earlier if needed       Thank you for coming to see [place] at [facility]. I hope we have been able to provide you high quality care today.  You may receive a patient satisfaction survey over the next few weeks. We would appreciate your feedback and comments so that we may continue to improve ourselves and the health of our patients.

## 2018-04-10 NOTE — Progress Notes (Signed)
I agree with the above plan 

## 2018-04-17 ENCOUNTER — Ambulatory Visit (INDEPENDENT_AMBULATORY_CARE_PROVIDER_SITE_OTHER): Payer: Managed Care, Other (non HMO) | Admitting: Orthopedic Surgery

## 2018-04-17 ENCOUNTER — Encounter (INDEPENDENT_AMBULATORY_CARE_PROVIDER_SITE_OTHER): Payer: Self-pay | Admitting: Orthopedic Surgery

## 2018-04-17 ENCOUNTER — Ambulatory Visit (INDEPENDENT_AMBULATORY_CARE_PROVIDER_SITE_OTHER): Payer: Managed Care, Other (non HMO)

## 2018-04-17 VITALS — Ht 75.0 in | Wt 204.6 lb

## 2018-04-17 DIAGNOSIS — M869 Osteomyelitis, unspecified: Secondary | ICD-10-CM | POA: Diagnosis not present

## 2018-04-17 DIAGNOSIS — Z89421 Acquired absence of other right toe(s): Secondary | ICD-10-CM

## 2018-04-17 DIAGNOSIS — L97512 Non-pressure chronic ulcer of other part of right foot with fat layer exposed: Secondary | ICD-10-CM

## 2018-04-17 MED ORDER — DOXYCYCLINE HYCLATE 100 MG PO CAPS: 100.0000 mg | ORAL_CAPSULE | Freq: Two times a day (BID) | ORAL | 1 refills | Status: DC

## 2018-04-17 NOTE — Progress Notes (Signed)
Office Visit Note   Patient: Curtis Bradford           Date of Birth: 01/12/1961           MRN: [id number] Visit Date: 04/17/2018              Requested by: No referring provider defined for this encounter. PCP: Patient, No Pcp Per  Chief Complaint  Patient presents with  . Right Foot - Routine Post Op      HPI: [name] a 57 year old gentleman who is 14 months status post right foot fifth ray amputation.  Patient also has significant peripheral vascular disease with diabetes he is on Plavix.  He states he has had 5 stents for the right lower extremity.  Patient complains of increasing ulceration and odor and drainage to the right great toe.  Patient states he is also status post seizure from his diabetes but he states that he is finally getting off his Keppra seizure medication slowly.  Assessment & Plan: Visit Diagnoses:  1. Skin ulcer of right great toe with fat layer exposed (HCC)   2. Status post amputation of toe of right foot (HCC)   3. Osteomyelitis of great toe of right foot (HCC)     Plan: Due to the destructive bony changes and the ulceration with cellulitis discussed with the patient recommendation to proceed with amputation of the right great toe through the MTP joint.  Risk and benefits were discussed including risk of the wound not healing the importance of nonweightbearing postoperatively.  Patient states he would like to proceed with surgery next Wednesday.  We will set this up for outpatient surgery next Wednesday.  Prescription called in for doxycycline to minimize risk of the infection spreading up his foot.  Follow-Up Instructions: Return in about 2 weeks (around 05/01/2018).   Ortho Exam  [name] alert, oriented, no adenopathy, well-dressed, normal affect, normal respiratory effort. Examination patient does have a palpable dorsalis pedis and posterior tibial pulse these are weak.  He has a large amount of nonviable tissue around the right great toe.  After  informed consent a 10 blade knife was used to debride the skin and soft tissue back to bleeding viable tissue this was touched with silver nitrate the ulcer is 2 cm in diameter and 10 mm deep this probes all the way to bone.  Radiographs shows destructive bony changes.  Patient has sausage digit swelling with cellulitis of the great toe.  Imaging: Xr Foot 2 Views Right  Result Date: 04/17/2018 2 view radiographs of the right foot shows destruction of the tuft of the right great toe consistent with osteomyelitis.  [name] status post resection of the metatarsal heads 2 3 and 4 status post fifth ray amputation approximately 14 months ago.  No images are attached to the encounter.  Labs: Lab Results  Component Value Date   HGBA1C 6.5 (A) 01/31/2018   HGBA1C 9.9 (H) 10/31/2017   HGBA1C 7.0 (H) 02/15/2017   ESRSEDRATE 31 (H) 02/14/2017   CRP 12.3 (H) 02/14/2017   REPTSTATUS 02/20/2017 FINAL 02/14/2017   REPTSTATUS 02/20/2017 FINAL 02/14/2017   CULT  02/14/2017    NO GROWTH 5 DAYS Performed at [facility], [address] [place], [place] [address]    CULT  02/14/2017    NO GROWTH 5 DAYS Performed at [facility], [address], [place], [place] [address]      Lab Results  Component Value Date  ALBUMIN 4.1 01/31/2018   ALBUMIN 3.7 10/30/2017   ALBUMIN 3.0 (L) 02/15/2017   PREALBUMIN 13.8 (L) 02/15/2017    Body mass index is 25.57 kg/m.  Orders:  Orders Placed This Encounter  Procedures  . XR Foot 2 Views Right   Meds ordered this encounter  Medications  . doxycycline (VIBRAMYCIN) 100 MG capsule    Sig: Take 1 capsule (100 mg total) by mouth 2 (two) times daily.    Dispense:  28 capsule    Refill:  1     Procedures: No procedures performed  Clinical Data: No additional findings.  ROS:  All other systems negative, except as noted in the HPI. Review of Systems  Objective: Vital Signs: Ht 6\' 3"  (1.905 m)   Wt 204 lb 9.6 oz (92.8 kg)   BMI  25.57 kg/m   Specialty Comments:  No specialty comments available.  PMFS History: Patient Active Problem List   Diagnosis Date Noted  . TIA (transient ischemic attack) 10/30/2017  . Medication monitoring encounter 03/28/2017  . Status post amputation of toe of right foot (HCC) 03/05/2017  . Subacute osteomyelitis, right ankle and foot (HCC)   . Peripheral neuropathy 02/16/2017  . Peripheral artery disease (HCC) 02/16/2017  . 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 Diabetic foot infection (HCC) 02/14/2017  . Type II diabetes mellitus with renal manifestations (HCC)   . HLD (hyperlipidemia)   . Tobacco abuse   . CKD stage 2 due to type 2 diabetes mellitus (HCC)   . CAD (coronary artery disease)   . Seizure disorder [facility])    Past Medical History:  Diagnosis Date  . CAD (coronary artery disease)   . CKD (chronic kidney disease), stage III (HCC)   . 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 History of DVT (deep vein thrombosis)   . HLD (hyperlipidemia)   . Peripheral vascular disease (HCC)   . Seizure (HCC)   . Stroke (HCC)   . Tobacco abuse     Family History  Problem Relation Age of Onset  . Diabetes Mellitus II Mother   . Diabetes Mellitus II Father     Past Surgical History:  Procedure Laterality Date  . AMPUTATION Right 02/23/2017   Procedure: RIGHT FOOT 5TH RAY AMPUTATION;  Surgeon: [name], MD;  Location: [facility] OR;  Service: Orthopedics;  Laterality: Right;  . FOOT SURGERY    . STENT PLACEMENT VASCULAR ([facility] HX)     Several in right leg   Social History   Occupational History  . Not on file  Tobacco Use  . Smoking status: Current Some Day Smoker    Packs/day: 1.00    Types: Cigarettes  . Smokeless tobacco: Never Used  . Tobacco comment: pack or less a week/ Occ  Substance and Sexual Activity  . Alcohol use: No    Comment: seldom  . Drug use: No  . Sexual activity: Not on file

## 2018-04-18 ENCOUNTER — Telehealth (INDEPENDENT_AMBULATORY_CARE_PROVIDER_SITE_OTHER): Payer: Self-pay | Admitting: Orthopedic Surgery

## 2018-04-18 NOTE — Telephone Encounter (Signed)
I called to advise Curtis Bradford of his surgery information for Wed 04/23/2018.  I left a message for him to return my call to discuss.

## 2018-04-21 ENCOUNTER — Other Ambulatory Visit: Payer: Self-pay

## 2018-04-21 ENCOUNTER — Encounter (HOSPITAL_COMMUNITY): Payer: Self-pay | Admitting: *Deleted

## 2018-04-21 ENCOUNTER — Ambulatory Visit (INDEPENDENT_AMBULATORY_CARE_PROVIDER_SITE_OTHER): Payer: Self-pay | Admitting: Physician Assistant

## 2018-04-21 NOTE — Progress Notes (Signed)
Curtis Bradford denies chest pain or shortness of breath.  Patient has Type II diabetes, CBG's run 90- 100.  I instructed patient to check CBG after awaking and every 2 hours until arrival  to the hospital.  I Instructed patient if CBG is less than 70 to drink or 1/2 cup of a clear juice. Recheck CBG in 15 minutes then call pre- op desk at [phone] for further instructions. If scheduled to receive Insulin, do not take Insulin. If CBG > 70 take 1/ dose of Lantus- 18 units, if CBG > 220 also take 1/2 dose of SS Insulin.

## 2018-04-22 NOTE — Progress Notes (Signed)
Anesthesia Chart Review: SAME DAY WORK-UP  Case:  [id number] Date/Time:  04/23/18 1133   Procedure:  Right Great Toe Amputation (Right )   Anesthesia type:  Choice   Pre-op diagnosis:  Osteomyelitis Right Great Toe   Location:  [facility] ROOM 03 / [facility]   Surgeon:  [name], MD      DISCUSSION: [name] a 57 year old male scheduled for the above procedure.  He was seen by Dr. Lajoyce Corners on 1024/19 with right great toe ulcer with fat layer exposed and cellulitis. He was started on doxycycline and scheduled for right great toe amputation.   History includes smoking, DM2, PVD (s/p multiple RLE stents; s/p excision of right 2nd, 3rd, and 5th metatarsal heads 01/23/17 [facility]; s/p right 5th toe ray amputation 04-Mar-2017), RLE DVT ~ 6 years ago, CAD/MI (s/p PCI 03/05/08), CVA (2012 [place]; left thalamic infarct 10/30/17), CKD stage III. In 2016 he had a seizure felt due to metformin overdose. He moved from [place] to [place] in 01/2017.  No specific cardiac details other than MI and coronary stent in 2011. Echo in 10/2017 showed normal LVEF with apical hypokinesis. (Unknown if this is an expected or new finding--he did report history of MI and had anterior Q waves on EKG.) He denied chest pain and SOB per PAT RN telephone Progress note. His last Cr 1.8. He has not seen nephrology yet. No answer at his preferred telephone number. I have reviewed available information with anesthesiologist Adonis Huguenin, MD. Anesthesiologist to evaluate on the day of surgery.   PROVIDERS: Patient, No Pcp Per  [name], MD is neurologist. Last visit with [name], NP on 04/10/18. [name], MD is endocrinologist. Last visit 01/31/18.  He had a referral to nephrologist [name], MD on 02/03/18. [facility] reports they have been attempting to reach Mr. Belinsky to schedule an appointment, but have been unsuccessful thus far.   LABS: He will need updated labs prior to surgery. Currently  most recent labs include:  Lab Results  Component Value Date   WBC 10.4 10/31/2017   HGB 13.8 10/31/2017   HCT 40.0 10/31/2017   PLT 261 10/31/2017   GLUCOSE 123 (H) 01/31/2018   ALT 27 01/31/2018   AST 17 01/31/2018   NA 139 01/31/2018   K 3.9 01/31/2018   CL 108 01/31/2018   CREATININE 1.80 (H) 01/31/2018   BUN 21 01/31/2018   CO2 25 01/31/2018   INR 0.93 10/30/2017   HGBA1C 6.5 (A) 01/31/2018   Creatinine range since 01/2017 has been 1.38-1.80, but most often ~ 1.4 range.   IMAGES: CTA neck 10/31/17: IMPRESSION: 1. Atherosclerosis without flow limiting stenosis or ulceration. 2. 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   EKG: 10/30/17: NSR, incomplete right BBB. Voltage criteria for LVH. Anteroseptal infarct (age undetermined). T wave abnormality, consider lateral ischemia. Borderline LAD. T wave abnormality in high lateral leads is more pronounced when compared to Mar 04, 2017 tracing.    CV: Transcranial Doppler with Bubble Study 11/01/17: There is no obvious evidence of high intensity transient signals (HITS), therefore there is no evidence of patent foramen ovale (PFO). Artifact was seen with valsalva maneuver, possible muscular interference. Negative TCD Bubble study  Echo 10/31/17 (as part of CVA work-up): Study Conclusions - Left ventricle: The cavity size was normal. Systolic function was   normal. The estimated ejection fraction was in the range of 55%   to 60%. Hypokinesis of the apical myocardium. Doppler parameters   are  consistent with abnormal left ventricular relaxation (grade 1   diastolic dysfunction). - Aortic valve: Trileaflet; mildly thickened, mildly calcified   leaflets. - Aortic root: The aortic root was mildly dilated. 42 mm. - Mitral valve: Valve area by pressure half-time: 2.44 cm^2.    Past Medical History:  Diagnosis Date  . CAD (coronary artery disease)   . CKD (chronic kidney disease), stage III (HCC)   . Diabetes mellitus without complication  (HCC)    Type II  . History of DVT (deep vein thrombosis)   . History of kidney stones   . HLD (hyperlipidemia)   . Myocardial infarction (HCC) 2009  . Peripheral vascular disease (HCC)   . Seizure (HCC)    last one 2015  . Stroke [facility]) 2012, 10/2017   2012-speech was effected- has come back.10/2017- numbness of right side no taste right  side. 2012-  . Tobacco abuse     Past Surgical History:  Procedure Laterality Date  . AMPUTATION Right 02/23/2017   Procedure: RIGHT FOOT 5TH RAY AMPUTATION;  Surgeon: [name], MD;  Location: [facility] OR;  Service: Orthopedics;  Laterality: Right;  . CORONARY ANGIOPLASTY  03/05/2008   stent  . FOOT SURGERY    . PERIPHERAL ARTERIAL STENT GRAFT Right    leg 2 stents  . STENT PLACEMENT VASCULAR ([facility] HX)     Several in right leg    MEDICATIONS: No current facility-administered medications for this encounter.    Marland Kitchen aspirin EC 81 MG tablet  . atorvastatin (LIPITOR) 20 MG tablet  . clopidogrel (PLAVIX) 75 MG tablet  . doxycycline (VIBRAMYCIN) 100 MG capsule  . ezetimibe (ZETIA) 10 MG tablet  . ibuprofen (ADVIL,MOTRIN) 200 MG tablet  . insulin glargine (LANTUS) 100 unit/mL SOPN  . insulin lispro (HUMALOG KWIKPEN) 100 UNIT/ML KiwkPen  . levETIRAcetam (KEPPRA XR) 500 MG 24 hr tablet  . linagliptin (TRADJENTA) 5 MG TABS tablet  . Continuous Blood Gluc Sensor (FREESTYLE LIBRE 14 DAY SENSOR) MISC  . silver sulfADIAZINE (SILVADENE) 1 % cream    Velna Ochs [facility] Short Stay Center/Anesthesiology Phone [phone] 04/22/2018 1:18 PM

## 2018-04-22 NOTE — Anesthesia Preprocedure Evaluation (Addendum)
Anesthesia Evaluation  Patient identified by MRN, date of birth, ID band  Reviewed: Allergy & Precautions, NPO status , Patient's Chart, lab work & pertinent test results  History of Anesthesia Complications Negative for: history of anesthetic complications  Airway Mallampati: III  TM Distance: >3 FB Neck ROM: Full    Dental no notable dental hx.    Pulmonary Current Smoker,    Pulmonary exam normal        Cardiovascular + CAD, + Past MI, + Cardiac Stents and + Peripheral Vascular Disease  Normal cardiovascular exam  TTE 10/31/17: Study Conclusions - Left ventricle: The cavity size was normal. Systolic function was   normal. The estimated ejection fraction was in the range of 55%   to 60%. Hypokinesis of the apical myocardium. Doppler parameters   are consistent with abnormal left ventricular relaxation (grade 1   diastolic dysfunction). - Aortic valve: Trileaflet; mildly thickened, mildly calcified   leaflets. - Aortic root: The aortic root was mildly dilated. - Mitral valve: Valve area by pressure half-time: 2.44 cm^2.   Neuro/Psych Seizures -,  TIACVA, Residual Symptoms negative psych ROS   GI/Hepatic negative GI ROS, Neg liver ROS,   Endo/Other  diabetes, Insulin Dependent, Oral Hypoglycemic Agents  Renal/GU Renal InsufficiencyRenal disease  negative genitourinary   Musculoskeletal negative musculoskeletal ROS (+)   Abdominal   Peds  Hematology negative hematology ROS (+)   Anesthesia Other Findings   Reproductive/Obstetrics                          Anesthesia Physical Anesthesia Plan  ASA: III  Anesthesia Plan: MAC   Post-op Pain Management:    Induction: Intravenous  PONV Risk Score and Plan: 0 and Treatment may vary due to age or medical condition and Propofol infusion  Airway Management Planned: Natural Airway, Nasal Cannula and Simple Face Mask  Additional Equipment:  None  Intra-op Plan:   Post-operative Plan:   Informed Consent: I have reviewed the patients History and Physical, chart, labs and discussed the procedure including the risks, benefits and alternatives for the proposed anesthesia with the patient or authorized representative who has indicated his/her understanding and acceptance.     Plan Discussed with:   Anesthesia Plan Comments: (See PAT note written 04/22/2018 by Myra Gianotti, PA-C. )      Anesthesia Quick Evaluation

## 2018-04-23 ENCOUNTER — Ambulatory Visit (HOSPITAL_COMMUNITY): Payer: Managed Care, Other (non HMO) | Admitting: Vascular Surgery

## 2018-04-23 ENCOUNTER — Ambulatory Visit (HOSPITAL_COMMUNITY)
Admission: RE | Admit: 2018-04-23 | Discharge: 2018-04-23 | Disposition: A | Discharge status: Home / Self Care | Payer: Managed Care, Other (non HMO) | Source: Ambulatory Visit | Attending: Orthopedic Surgery | Admitting: Orthopedic Surgery

## 2018-04-23 ENCOUNTER — Encounter (HOSPITAL_COMMUNITY): Payer: Self-pay

## 2018-04-23 ENCOUNTER — Other Ambulatory Visit: Payer: Self-pay

## 2018-04-23 ENCOUNTER — Encounter (HOSPITAL_COMMUNITY)
Admission: RE | Disposition: A | Discharge status: Home / Self Care | Payer: Self-pay | Source: Ambulatory Visit | Attending: Orthopedic Surgery

## 2018-04-23 DIAGNOSIS — E1151 Type 2 diabetes mellitus with diabetic peripheral angiopathy without gangrene: Secondary | ICD-10-CM | POA: Diagnosis not present

## 2018-04-23 DIAGNOSIS — Z7902 Long term (current) use of antithrombotics/antiplatelets: Secondary | ICD-10-CM | POA: Diagnosis not present

## 2018-04-23 DIAGNOSIS — R200 Anesthesia of skin: Secondary | ICD-10-CM | POA: Diagnosis not present

## 2018-04-23 DIAGNOSIS — R569 Unspecified convulsions: Secondary | ICD-10-CM | POA: Insufficient documentation

## 2018-04-23 DIAGNOSIS — E11621 Type 2 diabetes mellitus with foot ulcer: Secondary | ICD-10-CM | POA: Diagnosis not present

## 2018-04-23 DIAGNOSIS — Z7982 Long term (current) use of aspirin: Secondary | ICD-10-CM | POA: Insufficient documentation

## 2018-04-23 DIAGNOSIS — E1122 Type 2 diabetes mellitus with diabetic chronic kidney disease: Secondary | ICD-10-CM | POA: Diagnosis not present

## 2018-04-23 DIAGNOSIS — N183 Chronic kidney disease, stage 3 (moderate): Secondary | ICD-10-CM | POA: Insufficient documentation

## 2018-04-23 DIAGNOSIS — F17210 Nicotine dependence, cigarettes, uncomplicated: Secondary | ICD-10-CM | POA: Diagnosis not present

## 2018-04-23 DIAGNOSIS — Z8673 Personal history of transient ischemic attack (TIA), and cerebral infarction without residual deficits: Secondary | ICD-10-CM | POA: Insufficient documentation

## 2018-04-23 DIAGNOSIS — Z79899 Other long term (current) drug therapy: Secondary | ICD-10-CM | POA: Diagnosis not present

## 2018-04-23 DIAGNOSIS — L97512 Non-pressure chronic ulcer of other part of right foot with fat layer exposed: Secondary | ICD-10-CM | POA: Insufficient documentation

## 2018-04-23 DIAGNOSIS — Z87442 Personal history of urinary calculi: Secondary | ICD-10-CM | POA: Insufficient documentation

## 2018-04-23 DIAGNOSIS — E1169 Type 2 diabetes mellitus with other specified complication: Secondary | ICD-10-CM | POA: Diagnosis present

## 2018-04-23 DIAGNOSIS — I2510 Atherosclerotic heart disease of native coronary artery without angina pectoris: Secondary | ICD-10-CM | POA: Diagnosis not present

## 2018-04-23 DIAGNOSIS — Z794 Long term (current) use of insulin: Secondary | ICD-10-CM | POA: Diagnosis not present

## 2018-04-23 DIAGNOSIS — M86271 Subacute osteomyelitis, right ankle and foot: Secondary | ICD-10-CM | POA: Diagnosis not present

## 2018-04-23 DIAGNOSIS — I252 Old myocardial infarction: Secondary | ICD-10-CM | POA: Diagnosis not present

## 2018-04-23 DIAGNOSIS — M868X7 Other osteomyelitis, ankle and foot: Secondary | ICD-10-CM | POA: Insufficient documentation

## 2018-04-23 DIAGNOSIS — Z86718 Personal history of other venous thrombosis and embolism: Secondary | ICD-10-CM | POA: Diagnosis not present

## 2018-04-23 DIAGNOSIS — E785 Hyperlipidemia, unspecified: Secondary | ICD-10-CM | POA: Diagnosis not present

## 2018-04-23 HISTORY — DX: Personal history of urinary calculi: Z87.442

## 2018-04-23 HISTORY — DX: Acute myocardial infarction, unspecified: I21.9

## 2018-04-23 HISTORY — PX: AMPUTATION: SHX166

## 2018-04-23 LAB — CBC
HEMATOCRIT: 42.6 % (ref 39.0–52.0)
HEMOGLOBIN: 13.8 g/dL (ref 13.0–17.0)
MCH: 30.5 pg (ref 26.0–34.0)
MCHC: 32.4 g/dL (ref 30.0–36.0)
MCV: 94 fL (ref 80.0–100.0)
NRBC: 0 % (ref 0.0–0.2)
Platelets: 296 10*3/uL (ref 150–400)
RBC: 4.53 MIL/uL (ref 4.22–5.81)
RDW: 13.2 % (ref 11.5–15.5)
WBC: 13.9 10*3/uL — AB (ref 4.0–10.5)

## 2018-04-23 LAB — GLUCOSE, CAPILLARY
Glucose-Capillary: 102 mg/dL — ABNORMAL HIGH (ref 70–99)
Glucose-Capillary: 100 mg/dL — ABNORMAL HIGH (ref 70–99)

## 2018-04-23 LAB — BASIC METABOLIC PANEL
ANION GAP: 6 (ref 5–15)
BUN: 23 mg/dL — AB (ref 6–20)
CALCIUM: 8.7 mg/dL — AB (ref 8.9–10.3)
CO2: 23 mmol/L (ref 22–32)
Chloride: 107 mmol/L (ref 98–111)
Creatinine, Ser: 1.57 mg/dL — ABNORMAL HIGH (ref 0.61–1.24)
GFR calc Af Amer: 56 mL/min — ABNORMAL LOW (ref >60)
GFR, EST NON AFRICAN AMERICAN: 48 mL/min — AB (ref >60)
GLUCOSE: 106 mg/dL — AB (ref 70–99)
POTASSIUM: 3.8 mmol/L (ref 3.5–5.1)
Sodium: 136 mmol/L (ref 135–145)

## 2018-04-23 LAB — HEMOGLOBIN A1C
Hgb A1c MFr Bld: 7.4 % — ABNORMAL HIGH (ref 4.8–5.6)
Mean Plasma Glucose: 165.68 mg/dL

## 2018-04-23 SURGERY — AMPUTATION DIGIT
Anesthesia: Monitor Anesthesia Care | Laterality: Right

## 2018-04-23 MED ORDER — CEFAZOLIN SODIUM-DEXTROSE 2-4 GM/100ML-% IV SOLN
INTRAVENOUS | Status: AC
  Filled 2018-04-23: qty 100

## 2018-04-23 MED ORDER — LIDOCAINE HCL (PF) 1 % IJ SOLN
INTRAMUSCULAR | Status: AC
  Filled 2018-04-23: qty 30

## 2018-04-23 MED ORDER — MIDAZOLAM HCL 2 MG/2ML IJ SOLN
INTRAMUSCULAR | Status: AC
  Filled 2018-04-23: qty 2

## 2018-04-23 MED ORDER — PROPOFOL 10 MG/ML IV BOLUS
INTRAVENOUS | Status: DC | PRN
  Administered 2018-04-23 (×4): 50 mg via INTRAVENOUS

## 2018-04-23 MED ORDER — ONDANSETRON HCL 4 MG/2ML IJ SOLN: 4.0000 mg | Freq: Once | INTRAMUSCULAR | Status: DC | PRN

## 2018-04-23 MED ORDER — OXYCODONE HCL 5 MG/5ML PO SOLN: 5.0000 mg | Freq: Once | ORAL | Status: DC | PRN

## 2018-04-23 MED ORDER — ONDANSETRON HCL 4 MG/2ML IJ SOLN
INTRAMUSCULAR | Status: DC | PRN
  Administered 2018-04-23: 4 mg via INTRAVENOUS

## 2018-04-23 MED ORDER — LIDOCAINE 2% (20 MG/ML) 5 ML SYRINGE
INTRAMUSCULAR | Status: AC
  Filled 2018-04-23: qty 5

## 2018-04-23 MED ORDER — HYDROCODONE-ACETAMINOPHEN 5-325 MG PO TABS
1.0000 | ORAL_TABLET | Freq: Once | ORAL | Status: AC
  Administered 2018-04-23: 1 via ORAL

## 2018-04-23 MED ORDER — ONDANSETRON HCL 4 MG/2ML IJ SOLN
INTRAMUSCULAR | Status: AC
  Filled 2018-04-23: qty 2

## 2018-04-23 MED ORDER — 0.9 % SODIUM CHLORIDE (POUR BTL) OPTIME
TOPICAL | Status: DC | PRN
  Administered 2018-04-23: 1000 mL

## 2018-04-23 MED ORDER — LACTATED RINGERS IV SOLN
INTRAVENOUS | Status: DC
  Administered 2018-04-23 (×2): via INTRAVENOUS

## 2018-04-23 MED ORDER — HYDROCODONE-ACETAMINOPHEN 5-325 MG PO TABS: 1.0000 | ORAL_TABLET | ORAL | 0 refills | Status: DC | PRN

## 2018-04-23 MED ORDER — LIDOCAINE HCL 1 % IJ SOLN
INTRAMUSCULAR | Status: DC | PRN
  Administered 2018-04-23: 20 mL

## 2018-04-23 MED ORDER — CEFAZOLIN SODIUM-DEXTROSE 2-4 GM/100ML-% IV SOLN
2.0000 g | INTRAVENOUS | Status: AC
  Administered 2018-04-23: 2 g via INTRAVENOUS

## 2018-04-23 MED ORDER — MIDAZOLAM HCL 5 MG/5ML IJ SOLN
INTRAMUSCULAR | Status: DC | PRN
  Administered 2018-04-23: 2 mg via INTRAVENOUS

## 2018-04-23 MED ORDER — FENTANYL CITRATE (PF) 100 MCG/2ML IJ SOLN: 25.0000 ug | INTRAMUSCULAR | Status: DC | PRN

## 2018-04-23 MED ORDER — CHLORHEXIDINE GLUCONATE 4 % EX LIQD: 60.0000 mL | Freq: Once | CUTANEOUS | Status: DC

## 2018-04-23 MED ORDER — OXYCODONE HCL 5 MG PO TABS: 5.0000 mg | ORAL_TABLET | Freq: Once | ORAL | Status: DC | PRN

## 2018-04-23 MED ORDER — LIDOCAINE 2% (20 MG/ML) 5 ML SYRINGE
INTRAMUSCULAR | Status: DC | PRN
  Administered 2018-04-23: 60 mg via INTRAVENOUS

## 2018-04-23 MED ORDER — HYDROCODONE-ACETAMINOPHEN 5-325 MG PO TABS
ORAL_TABLET | ORAL | Status: AC
  Filled 2018-04-23: qty 1

## 2018-04-23 SURGICAL SUPPLY — 31 items
BLADE SURG 21 STRL SS (BLADE) ×2 IMPLANT
BNDG CMPR 9X4 STRL LF SNTH (GAUZE/BANDAGES/DRESSINGS)
BNDG COHESIVE 4X5 TAN STRL (GAUZE/BANDAGES/DRESSINGS) ×2 IMPLANT
BNDG ESMARK 4X9 LF (GAUZE/BANDAGES/DRESSINGS) IMPLANT
BNDG GAUZE ELAST 4 BULKY (GAUZE/BANDAGES/DRESSINGS) ×2 IMPLANT
COVER SURGICAL LIGHT HANDLE (MISCELLANEOUS) ×4 IMPLANT
COVER WAND RF STERILE (DRAPES) ×2 IMPLANT
DRAPE U-SHAPE 47X51 STRL (DRAPES) ×2 IMPLANT
DRSG ADAPTIC 3X8 NADH LF (GAUZE/BANDAGES/DRESSINGS) ×1 IMPLANT
DRSG PAD ABDOMINAL 8X10 ST (GAUZE/BANDAGES/DRESSINGS) ×2 IMPLANT
DURAPREP 26ML APPLICATOR (WOUND CARE) ×2 IMPLANT
ELECT REM PT RETURN 9FT ADLT (ELECTROSURGICAL) ×2
ELECTRODE REM PT RTRN 9FT ADLT (ELECTROSURGICAL) ×1 IMPLANT
GAUZE SPONGE 4X4 12PLY STRL (GAUZE/BANDAGES/DRESSINGS) IMPLANT
GAUZE SPONGE 4X4 12PLY STRL LF (GAUZE/BANDAGES/DRESSINGS) ×1 IMPLANT
GLOVE BIOGEL PI IND STRL 9 (GLOVE) ×1 IMPLANT
GLOVE BIOGEL PI INDICATOR 9 (GLOVE) ×1
GLOVE SURG ORTHO 9.0 STRL STRW (GLOVE) ×2 IMPLANT
GOWN STRL REUS W/ TWL XL LVL3 (GOWN DISPOSABLE) ×2 IMPLANT
GOWN STRL REUS W/TWL XL LVL3 (GOWN DISPOSABLE) ×4
KIT BASIN OR (CUSTOM PROCEDURE TRAY) ×2 IMPLANT
KIT TURNOVER KIT B (KITS) ×2 IMPLANT
MANIFOLD NEPTUNE II (INSTRUMENTS) ×2 IMPLANT
NEEDLE 22X1 1/2 (OR ONLY) (NEEDLE) IMPLANT
NS IRRIG 1000ML POUR BTL (IV SOLUTION) ×2 IMPLANT
PACK ORTHO EXTREMITY (CUSTOM PROCEDURE TRAY) ×2 IMPLANT
PAD ABD 8X10 STRL (GAUZE/BANDAGES/DRESSINGS) ×1 IMPLANT
PAD ARMBOARD 7.5X6 YLW CONV (MISCELLANEOUS) ×4 IMPLANT
SUT ETHILON 2 0 PSLX (SUTURE) ×2 IMPLANT
SYR CONTROL 10ML LL (SYRINGE) IMPLANT
TOWEL OR 17X26 10 PK STRL BLUE (TOWEL DISPOSABLE) ×2 IMPLANT

## 2018-04-23 NOTE — Op Note (Signed)
04/23/2018  11:01 AM  PATIENT:  Curtis Bradford    PRE-OPERATIVE DIAGNOSIS:  Osteomyelitis Right Great Toe  POST-OPERATIVE DIAGNOSIS:  Same  PROCEDURE:  Right Great Toe Amputation, at the MTP joint.  SURGEON:  Newt Minion, MD  PHYSICIAN ASSISTANT:None ANESTHESIA:   General  PREOPERATIVE INDICATIONS:  [name] is a  57 y.o. male with a diagnosis of Osteomyelitis Right Great Toe who failed conservative measures and elected for surgical management.    The risks benefits and alternatives were discussed with the patient preoperatively including but not limited to the risks of infection, bleeding, nerve injury, cardiopulmonary complications, the need for revision surgery, among others, and the patient was willing to proceed.  OPERATIVE IMPLANTS: None  @ENCIMAGES @  OPERATIVE FINDINGS: No abscess at the amputation site.  OPERATIVE PROCEDURE: Patient was brought the operating room and underwent a MAC anesthetic.  Patient's right lower extremity was then prepped using DuraPrep draped in the sterile field a timeout was called.  20 cc of 1% lidocaine plain was used to provide a digital block.  After adequate levels of anesthesia were obtained patient's great toe was amputated through the MTP joint.  Electrocautery was used for hemostasis the incision was irrigated with normal saline incision was closed using 2-0 nylon and a sterile dressing was applied.   DISCHARGE PLANNING:  Antibiotic duration: Preoperative antibiotics  Weightbearing: Touchdown weightbearing on the right  Pain medication: Prescription for Vicodin  Dressing care/ Wound VAC: Dry dressing  Ambulatory devices: Walker or crutches  Discharge to: Home.  Follow-up: In the office 1 week post operative.

## 2018-04-23 NOTE — H&P (Signed)
Curtis Bradford is an 57 y.o. male.   Chief Complaint: pain and swelling right great toe HPI: [name] a 57 year old gentleman who is 14 months status post right foot fifth ray amputation.  Patient also has significant peripheral vascular disease with diabetes he is on Plavix.  He states he has had 5 stents for the right lower extremity.  Patient complains of increasing ulceration and odor and drainage to the right great toe.  Patient states he is also status post seizure from his diabetes but he states that he is finally getting off his Keppra seizure medication slowly.   Past Medical History:  Diagnosis Date  . CAD (coronary artery disease)   . CKD (chronic kidney disease), stage III (HCC)   . 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 History of DVT (deep vein thrombosis)   . History of kidney stones   . HLD (hyperlipidemia)   . Myocardial infarction (HCC) 2009  . Peripheral vascular disease (HCC)   . Seizure (HCC)    last one 2015  . Stroke [facility]) 2012, 10/2017   2012-speech was effected- has come back.10/2017- numbness of right side no taste right  side. 2012-  . Tobacco abuse     Past Surgical History:  Procedure Laterality Date  . AMPUTATION Right 02/23/2017   Procedure: RIGHT FOOT 5TH RAY AMPUTATION;  Surgeon: [name], MD;  Location[facility] OR;  Service: Orthopedics;  Laterality: Right;  . CORONARY ANGIOPLASTY  03/05/2008   stent  . FOOT SURGERY    . PERIPHERAL ARTERIAL STENT GRAFT Right    leg 2 stents  . STENT PLACEMENT VASCULAR ([facility] HX)     Several in right leg    Family History  Problem Relation Age of Onset  . Diabetes Mellitus II Mother   . Diabetes Mellitus II Father    Social History:  reports that he has been smoking cigarettes. He has smoked for the past 15.00 years. He has never used smokeless tobacco. He reports that he does not drink alcohol or use drugs.  Allergies: No Known Allergies  No medications prior to admission.    No results  found for this or any previous visit (from the past 48 hour(s)). No results found.  Review of Systems  All other systems reviewed and are negative.   There were no vitals taken for this visit. Physical Exam  [name] alert, oriented, no adenopathy, well-dressed, normal affect, normal respiratory effort. Examination patient does have a palpable dorsalis pedis and posterior tibial pulse these are weak.  He has a large amount of nonviable tissue around the right great toe.  After informed consent a 10 blade knife was used to debride the skin and soft tissue back to bleeding viable tissue this was touched with silver nitrate the ulcer is 2 cm in diameter and 10 mm deep this probes all the way to bone.  Radiographs shows destructive bony changes.  Patient has sausage digit swelling with cellulitis of the great toe. Assessment/Plan 1. Skin ulcer of right great toe with fat layer exposed (HCC)   2. Status post amputation of toe of right foot (HCC)   3. Osteomyelitis of great toe of right foot (HCC)     Plan: Due to the destructive bony changes and the ulceration with cellulitis discussed with the patient recommendation to proceed with amputation of the right great toe through the MTP joint.  Risk and benefits were discussed including risk of the wound not  healing the importance of nonweightbearing postoperatively.   [name], MD 04/23/2018, 6:45 AM

## 2018-04-23 NOTE — Anesthesia Procedure Notes (Signed)
Procedure Name: MAC Date/Time: 04/23/2018 10:40 AM Performed by: Renato Shin, CRNA Pre-anesthesia Checklist: Patient identified, Emergency Drugs available, Suction available and Patient being monitored Patient Re-evaluated:Patient Re-evaluated prior to induction Oxygen Delivery Method: Nasal cannula Preoxygenation: Pre-oxygenation with 100% oxygen Induction Type: IV induction Placement Confirmation: positive ETCO2,  CO2 detector and breath sounds checked- equal and bilateral Dental Injury: Teeth and Oropharynx as per pre-operative assessment

## 2018-04-23 NOTE — Progress Notes (Signed)
Orthopedic Tech Progress Note Patient Details:  Curtis Bradford [birth year] [id number]  Ortho Devices Type of Ortho Device: Postop shoe/boot Ortho Device/Splint Location: rle Ortho Device/Splint Interventions: Application   Post Interventions Patient Tolerated: Well Instructions Provided: Care of device Viewed order from doctor's order list  Nikki Dom 04/23/2018, 11:36 AM

## 2018-04-23 NOTE — Transfer of Care (Signed)
Immediate Anesthesia Transfer of Care Note  Patient: Curtis Bradford  Procedure(s) Performed: Right Great Toe Amputation (Right )  Patient Location: PACU  Anesthesia Type:MAC  Level of Consciousness: awake, drowsy and patient cooperative  Airway & Oxygen Therapy: Patient Spontanous Breathing and Patient connected to nasal cannula oxygen  Post-op Assessment: Report given to RN and Post -op Vital signs reviewed and stable  Post vital signs: Reviewed and stable  Last Vitals:  Vitals Value Taken Time  BP 100/64 04/23/2018 11:04 AM  Temp    Pulse 77 04/23/2018 11:04 AM  Resp 19 04/23/2018 11:04 AM  SpO2 97 % 04/23/2018 11:04 AM  Vitals shown include unvalidated device data.  Last Pain:  Vitals:   04/23/18 1000  TempSrc:   PainSc: 3       Patients Stated Pain Goal: 0 (81/15/72 6203)  Complications: No apparent anesthesia complications

## 2018-04-24 ENCOUNTER — Encounter (HOSPITAL_COMMUNITY): Payer: Self-pay | Admitting: Orthopedic Surgery

## 2018-04-24 NOTE — Anesthesia Postprocedure Evaluation (Signed)
Anesthesia Post Note  Patient: Curtis Bradford  Procedure(s) Performed: Right Great Toe Amputation (Right )     Patient location during evaluation: PACU Anesthesia Type: MAC Level of consciousness: awake and alert Pain management: pain level controlled Vital Signs Assessment: post-procedure vital signs reviewed and stable Respiratory status: spontaneous breathing, nonlabored ventilation and respiratory function stable Cardiovascular status: blood pressure returned to baseline and stable Postop Assessment: no apparent nausea or vomiting Anesthetic complications: no    Last Vitals:  Vitals:   04/23/18 1307 04/23/18 1345  BP: (!) 152/83 (!) 117/55  Pulse: 75 75  Resp: 18 17  Temp:    SpO2: 97% 97%    Last Pain:  Vitals:   04/23/18 1345  TempSrc:   PainSc: 0-No pain                 Lidia Collum

## 2018-04-28 ENCOUNTER — Ambulatory Visit (INDEPENDENT_AMBULATORY_CARE_PROVIDER_SITE_OTHER): Payer: Managed Care, Other (non HMO) | Admitting: Orthopedic Surgery

## 2018-04-28 ENCOUNTER — Encounter (INDEPENDENT_AMBULATORY_CARE_PROVIDER_SITE_OTHER): Payer: Self-pay | Admitting: Orthopedic Surgery

## 2018-04-28 VITALS — Ht 74.0 in | Wt 198.0 lb

## 2018-04-28 DIAGNOSIS — Z89421 Acquired absence of other right toe(s): Secondary | ICD-10-CM

## 2018-04-28 NOTE — Progress Notes (Signed)
Office Visit Note   Patient: Curtis Bradford           Date of Birth: Jul 25, 1960           MRN: [id number] Visit Date: 04/28/2018              Requested by: No referring provider defined for this encounter. PCP: Patient, No Pcp Per  Chief Complaint  Patient presents with  . Right Foot - Routine Post Op    04/23/18 right GT amp      HPI: Patient presents status post right great toe and second toe amputation.  Patient states he has some swelling some slight maceration he states he feels fine he has been full weightbearing in a postoperative shoe.  Assessment & Plan: Visit Diagnoses:  1. Status post amputation of toe of right foot (HCC)     Plan: Patient will begin Dial soap cleansing 4 x 4 gauze remove the Ace wrap postoperative shoe discussed the importance of minimizing weightbearing on the right foot harvest the sutures at follow-up.  Follow-Up Instructions: Return in about 2 weeks (around 05/12/2018).   Ortho Exam  [name] alert, oriented, no adenopathy, well-dressed, normal affect, normal respiratory effort. Examination the incision is well approximated the lateral aspect of the incision has a small amount of clear serous drainage the wound is gaped open 1 x 5 mm.  There is no cellulitis no signs of infection.  Imaging: No results found. No images are attached to the encounter.  Labs: Lab Results  Component Value Date   HGBA1C 7.4 (H) 04/23/2018   HGBA1C 6.5 (A) 01/31/2018   HGBA1C 9.9 (H) 10/31/2017   ESRSEDRATE 31 (H) 02/14/2017   CRP 12.3 (H) 02/14/2017   REPTSTATUS 02/20/2017 FINAL 02/14/2017   REPTSTATUS 02/20/2017 FINAL 02/14/2017   CULT  02/14/2017    NO GROWTH 5 DAYS Performed at [facility], [address], [place], [place] [address]    CULT  02/14/2017    NO GROWTH 5 DAYS Performed at [facility], [address], [place], [place] [address]      Lab Results  Component Value Date   ALBUMIN 4.1 01/31/2018   ALBUMIN 3.7  10/30/2017   ALBUMIN 3.0 (L) 02/15/2017   PREALBUMIN 13.8 (L) 02/15/2017    Body mass index is 25.42 kg/m.  Orders:  No orders of the defined types were placed in this encounter.  No orders of the defined types were placed in this encounter.    Procedures: No procedures performed  Clinical Data: No additional findings.  ROS:  All other systems negative, except as noted in the HPI. Review of Systems  Objective: Vital Signs: Ht 6\' 2"  (1.88 m)   Wt 198 lb (89.8 kg)   BMI 25.42 kg/m   Specialty Comments:  No specialty comments available.  PMFS History: Patient Active Problem List   Diagnosis Date Noted  . TIA (transient ischemic attack) 10/30/2017  . Medication monitoring encounter 03/28/2017  . Status post amputation of toe of right foot (HCC) 03/05/2017  . Subacute osteomyelitis, right ankle and foot (HCC)   . Peripheral neuropathy 02/16/2017  . Peripheral artery disease (HCC) 02/16/2017  . 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 Diabetic foot infection (HCC) 02/14/2017  . Type II diabetes mellitus with renal manifestations (HCC)   . HLD (hyperlipidemia)   . Tobacco abuse   . CKD stage 2 due to type 2 diabetes mellitus (HCC)   .  CAD (coronary artery disease)   . Seizure disorder [facility])    Past Medical History:  Diagnosis Date  . CAD (coronary artery disease)   . CKD (chronic kidney disease), stage III (HCC)   . 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 History of DVT (deep vein thrombosis)   . History of kidney stones   . HLD (hyperlipidemia)   . Myocardial infarction (HCC) 2009  . Peripheral vascular disease (HCC)   . Seizure (HCC)    last one 2015  . Stroke [facility]) 2012, 10/2017   2012-speech was effected- has come back.10/2017- numbness of right side no taste right  side. 2012-  . Tobacco abuse     Family History  Problem Relation Age of Onset  . Diabetes Mellitus II Mother   . Diabetes Mellitus II  Father     Past Surgical History:  Procedure Laterality Date  . AMPUTATION Right 02/23/2017   Procedure: RIGHT FOOT 5TH RAY AMPUTATION;  Surgeon: [name], MD;  Location: [facility] OR;  Service: Orthopedics;  Laterality: Right;  . AMPUTATION Right 04/23/2018   Procedure: Right Great Toe Amputation;  Surgeon: [name], MD;  Location: [facility] OR;  Service: Orthopedics;  Laterality: Right;  . CORONARY ANGIOPLASTY  03/05/2008   stent  . FOOT SURGERY    . PERIPHERAL ARTERIAL STENT GRAFT Right    leg 2 stents  . STENT PLACEMENT VASCULAR ([facility] HX)     Several in right leg   Social History   Occupational History  . Not on file  Tobacco Use  . Smoking status: Light Tobacco Smoker    Years: 15.00    Types: Cigarettes  . Smokeless tobacco: Never Used  . Tobacco comment: less than a pack a week  Substance and Sexual Activity  . Alcohol use: No    Comment: seldom  . Drug use: No  . Sexual activity: Not on file

## 2018-05-05 ENCOUNTER — Telehealth: Payer: Self-pay | Admitting: Endocrinology

## 2018-05-05 ENCOUNTER — Other Ambulatory Visit: Payer: Managed Care, Other (non HMO)

## 2018-05-05 NOTE — Telephone Encounter (Signed)
Per [facility] "Caller is supposed to have lab work done today around 1 p.m., but he has to work and will not be there. He had surgery on 04-23-18 and they did blood work the. Does it need to be repeated?"

## 2018-05-06 NOTE — Telephone Encounter (Signed)
Patient notified of note below. 

## 2018-05-06 NOTE — Telephone Encounter (Signed)
Please advise 

## 2018-05-06 NOTE — Telephone Encounter (Signed)
We do not need to repeat lab work

## 2018-05-07 ENCOUNTER — Encounter: Payer: Self-pay | Admitting: Endocrinology

## 2018-05-07 ENCOUNTER — Ambulatory Visit (INDEPENDENT_AMBULATORY_CARE_PROVIDER_SITE_OTHER): Payer: Managed Care, Other (non HMO) | Admitting: Endocrinology

## 2018-05-07 VITALS — BP 142/80 | HR 83 | Ht 74.0 in | Wt 205.0 lb

## 2018-05-07 DIAGNOSIS — Z794 Long term (current) use of insulin: Secondary | ICD-10-CM | POA: Diagnosis not present

## 2018-05-07 DIAGNOSIS — E1165 Type 2 diabetes mellitus with hyperglycemia: Secondary | ICD-10-CM

## 2018-05-07 MED ORDER — LINAGLIPTIN 5 MG PO TABS: 5.0000 mg | ORAL_TABLET | Freq: Every day | ORAL | 3 refills | Status: DC

## 2018-05-07 NOTE — Progress Notes (Signed)
Patient ID: Curtis Bradford, male   DOB: [birth year], 57 y.o.   MRN: [id number]          Reason for Appointment: Follow-up for Type 2 Diabetes  Referring provider: Venancio Poisson, NP   History of Present Illness:          Date of diagnosis of type 2 diabetes mellitus: Probably 2006        Background history:   Initially treated with metformin He says his metformin was stopped when he had some problems with his kidney function. He was started on insulin in 2016 after failure of other drugs but details are not available. He has been mostly taking Lantus insulin However appears that he has had worsening control within the first part of 2019 after moving to the local area   Recent history:   INSULIN regimen is:  4-6 units Humalog for larger meals   38 units Lantus daily  His A1c has gone up to 7.4 from 6.5  Non-insulin hypoglycemic drugs the patient is taking are: Was on Tradjenta  Current management, blood sugar patterns and problems identified:  Unable to review his blood sugar readings at home because of his not bringing his freestyle Libre reader or the active smart phone associated with this  He says he ran out of Tradjenta about a month ago and did not ask for a refill  With this his blood sugars tend to be higher after meals up to 160 but not consistently  He is still trying to keep his carbohydrate content in diet  low but appears to have gained weight  He says that sometimes he will have a blood sugar around 70 without symptoms and then he may not take Lantus for a day or 2 until his blood sugar is back up around 100  Not doing any formal exercise except being active at work        Side effects from medications have been: None  Compliance with the medical regimen: Improved   Glucose monitoring:  done variable times a day         Glucometer:  Freestyle Libre     Blood Glucose readings not available   Self-care: The diet that the patient has been following is:  tries to limit starchy foods.      Typical meal intake: Breakfast is   usually bacon, eggs and grits  Usually not eating lunch Evening meal is usually low-carbohydrate           Dietician visit, most recent: 2006               Exercise:  Walking mostly at work, less recently because of foot infection  Weight history: Previous highest weight was 230  Wt Readings from Last 3 Encounters:  05/07/18 205 lb (93 kg)  04/28/18 198 lb (89.8 kg)  04/23/18 198 lb (89.8 kg)    Glycemic control:   Lab Results  Component Value Date   HGBA1C 7.4 (H) 04/23/2018   HGBA1C 6.5 (A) 01/31/2018   HGBA1C 9.9 (H) 10/31/2017   Lab Results  Component Value Date   MICROALBUR 5.4 (H) 01/31/2018   LDLCALC 22 01/31/2018   CREATININE 1.57 (H) 04/23/2018   Lab Results  Component Value Date   MICRALBCREAT 4.8 01/31/2018    No results found for: FRUCTOSAMINE    Allergies as of 05/07/2018   No Known Allergies     Medication List        Accurate as of 05/07/18  1:57  PM. Always use your most recent med list.          aspirin EC 81 MG tablet Take 81 mg by mouth daily.   atorvastatin 20 MG tablet Commonly known as:  LIPITOR Take 1 tablet (20 mg total) by mouth daily.   clopidogrel 75 MG tablet Commonly known as:  PLAVIX Take 1 tablet (75 mg total) by mouth at bedtime.   doxycycline 100 MG capsule Commonly known as:  VIBRAMYCIN Take 1 capsule (100 mg total) by mouth 2 (two) times daily.   ezetimibe 10 MG tablet Commonly known as:  ZETIA Take 1 tablet (10 mg total) by mouth daily.   FREESTYLE LIBRE 14 DAY SENSOR Misc Place 1 patch onto the skin every 14 (fourteen) days.   HYDROcodone-acetaminophen 5-325 MG tablet Commonly known as:  NORCO/VICODIN Take 1-2 tablets by mouth every 4 (four) hours as needed for moderate pain.   ibuprofen 200 MG tablet Commonly known as:  ADVIL,MOTRIN Take 400 mg by mouth daily as needed for headache.   insulin glargine 100 unit/mL Sopn Commonly  known as:  LANTUS Inject 0.37 mLs (37 Units total) into the skin at bedtime.   insulin lispro 100 UNIT/ML KiwkPen Commonly known as:  HUMALOG Inject 0-0.15 mLs (0-15 Units total) into the skin 2 (two) times daily. Typically takes 4-6 units per meal. Per sliding scale   levETIRAcetam 500 MG 24 hr tablet Commonly known as:  KEPPRA XR Take 1 tablet (500 mg total) by mouth daily.   linagliptin 5 MG Tabs tablet Commonly known as:  TRADJENTA Take 5 mg by mouth daily.   silver sulfADIAZINE 1 % cream Commonly known as:  SILVADENE Apply 1 application topically daily.       Allergies: No Known Allergies  Past Medical History:  Diagnosis Date  . CAD (coronary artery disease)   . CKD (chronic kidney disease), stage III ([place])   . Diabetes mellitus without complication ([place])    Type II  . History of DVT (deep vein thrombosis)   . History of kidney stones   . HLD (hyperlipidemia)   . Myocardial infarction ([place]) 2009  . Peripheral vascular disease ([place])   . Seizure ([place])    last one 2015  . Stroke [facility]) 2012, 10/2017   2012-speech was effected- has come back.10/2017- numbness of right side no taste right  side. 2012-  . Tobacco abuse     Past Surgical History:  Procedure Laterality Date  . AMPUTATION Right 02/23/2017   Procedure: RIGHT FOOT 5TH RAY AMPUTATION;  Surgeon: Newt Minion, MD;  Location: [place];  Service: Orthopedics;  Laterality: Right;  . AMPUTATION Right 04/23/2018   Procedure: Right Great Toe Amputation;  Surgeon: Newt Minion, MD;  Location: [place];  Service: Orthopedics;  Laterality: Right;  . CORONARY ANGIOPLASTY  03/05/2008   stent  . FOOT SURGERY    . PERIPHERAL ARTERIAL STENT GRAFT Right    leg 2 stents  . STENT PLACEMENT VASCULAR ([place] HX)     Several in right leg    Family History  Problem Relation Age of Onset  . Diabetes Mellitus II Mother   . Diabetes Mellitus II Father     Social History:  reports that he has been smoking cigarettes. He has  smoked for the past 15.00 years. He has never used smokeless tobacco. He reports that he does not drink alcohol or use drugs.   Review of Systems   Lipid history: Has been treated with Lipitor 80  mg daily because of his history of CAD    Lab Results  Component Value Date   CHOL 86 01/31/2018   HDL 23.90 (L) 01/31/2018   LDLCALC 22 01/31/2018   TRIG 200.0 (H) 01/31/2018   CHOLHDL 4 01/31/2018           Hypertension:  blood pressure appears to be variable, followed by PCP  BP Readings from Last 3 Encounters:  05/07/18 (!) 142/80  04/23/18 (!) 117/55  04/10/18 (!) 148/78    Most recent eye exam was in ?  2016  Most recent foot exam: 01/2018  Recently had toe amputation because of osteomyelitis  Complications of diabetes: Severe neuropathy with history of diabetic foot ulcer and toe amputations  Renal dysfunction of unclear etiology  Lab Results  Component Value Date   CREATININE 1.57 (H) 04/23/2018   CREATININE 1.80 (H) 01/31/2018   CREATININE 1.35 (H) 11/01/2017     LABS:  No visits with results within 1 Week(s) from this visit.  Latest known visit with results is:  Admission on 04/23/2018, Discharged on 04/23/2018  Component Date Value Ref Range Status  . Glucose-Capillary 04/23/2018 102* 70 - 99 mg/dL Final  . Comment 1 04/23/2018 Notify RN   Final  . Comment 2 04/23/2018 Document in Chart   Final  . Sodium 04/23/2018 136  135 - 145 mmol/L Final  . Potassium 04/23/2018 3.8  3.5 - 5.1 mmol/L Final  . Chloride 04/23/2018 107  98 - 111 mmol/L Final  . CO2 04/23/2018 23  22 - 32 mmol/L Final  . Glucose, Bld 04/23/2018 106* 70 - 99 mg/dL Final  . BUN 04/23/2018 23* 6 - 20 mg/dL Final  . Creatinine, Ser 04/23/2018 1.57* 0.61 - 1.24 mg/dL Final  . Calcium 04/23/2018 8.7* 8.9 - 10.3 mg/dL Final  . GFR calc non Af Amer 04/23/2018 48* >60 mL/min Final  . GFR calc Af Amer 04/23/2018 56* >60 mL/min Final   Comment: (NOTE) The eGFR has been calculated using the CKD  EPI equation. This calculation has not been validated in all clinical situations. eGFR's persistently <60 mL/min signify possible Chronic Kidney Disease.   Georgiann Hahn gap 04/23/2018 6  5 - 15 Final   Performed at [facility], [place] [address] [place], [place] [address]  . Hgb A1c MFr Bld 04/23/2018 7.4* 4.8 - 5.6 % Final   Comment: (NOTE) Pre diabetes:          5.7%-6.4% Diabetes:              >6.4% Glycemic control for   <7.0% adults with diabetes   . Mean Plasma Glucose 04/23/2018 165.68  mg/dL Final   Performed at [facility] [address] [place], [place] [address]  . WBC 04/23/2018 13.9* 4.0 - 10.5 K/uL Final  . RBC 04/23/2018 4.53  4.22 - 5.81 MIL/uL Final  . Hemoglobin 04/23/2018 13.8  13.0 - 17.0 g/dL Final  . HCT 04/23/2018 42.6  39.0 - 52.0 % Final  . MCV 04/23/2018 94.0  80.0 - 100.0 fL Final  . MCH 04/23/2018 30.5  26.0 - 34.0 pg Final  . MCHC 04/23/2018 32.4  30.0 - 36.0 g/dL Final  . RDW 04/23/2018 13.2  11.5 - 15.5 % Final  . Platelets 04/23/2018 296  150 - 400 K/uL Final  . nRBC 04/23/2018 0.0  0.0 - 0.2 % Final   Performed at [facility], [place] [address], [place], [place] [address]  . Glucose-Capillary  04/23/2018 100* 70 - 99 mg/dL Final    Physical Examination:  BP (!) 142/80   Pulse 83   Ht _0  (1.88 m)   Wt 205 lb (93 kg)   SpO2 96%   BMI 26.32 kg/m   Exam not indicated    ASSESSMENT:  Diabetes type 2, insulin-dependent  See history of present illness for detailed discussion of current diabetes management, blood sugar patterns and problems identified  His A1c has gone up slightly to 7.4 compared to 6.5  Although he says that he still using the freestyle libre sensor and this is helping him with dietary compliance he has no record to show today Discussed that he needs to link his smart phone to the practice and 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  He thinks his blood sugars are higher also because of  not taking Tradjenta for the last month He usually does not take much mealtime insulin and reportedly does not have high readings unless eating a large carbohydrate meal  Fasting reading reportedly fairly good with 38 units Lantus and no symptomatic hypoglycemia  Renal dysfunction: Creatinine slightly better compared to 3 months ago, needs regular follow-up with nephrologist   PLAN:    Refilled Tradjenta as this appears to be helpful  No change in Lantus  Discussed that if he has low normal sugars he can reduce his dose but not to skip the dose completely on the basal insulin  He will let us know when his freestyle Elenor Legato is linked to our practice website and will be able to review his readings  Follow-up in 3 months  There are no Patient Instructions on file for this visit.      Elayne Snare 05/07/2018, 1:57 PM   Note: This office note was prepared with Dragon voice recognition system technology. Any transcriptional errors that result from this process are unintentional.

## 2018-05-07 NOTE — Patient Instructions (Signed)
Check blood sugars on waking up 7 days a week  Also check blood sugars about 2 hours after meals and do this after different meals by rotation  Recommended blood sugar levels on waking up are 90-130 and about 2 hours after meal is 130-160  Please bring your blood sugar monitor to each visit, thank you  May reduce lantus if sugar <80 in ams

## 2018-05-12 ENCOUNTER — Ambulatory Visit (INDEPENDENT_AMBULATORY_CARE_PROVIDER_SITE_OTHER): Payer: Managed Care, Other (non HMO) | Admitting: Physician Assistant

## 2018-05-12 ENCOUNTER — Encounter (INDEPENDENT_AMBULATORY_CARE_PROVIDER_SITE_OTHER): Payer: Self-pay | Admitting: Orthopedic Surgery

## 2018-05-12 VITALS — Ht 74.0 in | Wt 205.0 lb

## 2018-05-12 DIAGNOSIS — E11621 Type 2 diabetes mellitus with foot ulcer: Secondary | ICD-10-CM

## 2018-05-12 DIAGNOSIS — N182 Chronic kidney disease, stage 2 (mild): Secondary | ICD-10-CM

## 2018-05-12 DIAGNOSIS — E1122 Type 2 diabetes mellitus with diabetic chronic kidney disease: Secondary | ICD-10-CM

## 2018-05-12 DIAGNOSIS — L97509 Non-pressure chronic ulcer of other part of unspecified foot with unspecified severity: Secondary | ICD-10-CM

## 2018-05-12 DIAGNOSIS — Z794 Long term (current) use of insulin: Secondary | ICD-10-CM

## 2018-05-12 DIAGNOSIS — Z89421 Acquired absence of other right toe(s): Secondary | ICD-10-CM

## 2018-05-13 ENCOUNTER — Encounter (INDEPENDENT_AMBULATORY_CARE_PROVIDER_SITE_OTHER): Payer: Self-pay | Admitting: Physician Assistant

## 2018-05-13 NOTE — Progress Notes (Signed)
Office Visit Note   Patient: Curtis DimesJohn Bradford           Date of Birth: [birth year]           MRN: [id number] Visit Date: 05/12/2018              Requested by: No referring provider defined for this encounter. PCP: Patient, No Pcp Per  Chief Complaint  Patient presents with  . Right Foot - Routine Post Op    04/23/18 right Gt amp      HPI: The patient is a 57 yo male who is seen for post operative follow up following right great toe amputation 04/23/18. He reports he has not been staying off the foot as much as he should. He would like to would like to be working some and we discussed the importance of off loading the foot and compression to control swelling.   Assessment & Plan: Visit Diagnoses:  1. Status post amputation of toe of right foot (HCC)   2. Type 2 diabetes mellitus with foot ulcer, with long-term current use of insulin (HCC)   3. CKD stage 2 due to type 2 diabetes mellitus (HCC)     Plan: Sutures removed this visit. Patient given prescription for Vive compression socks to help control edema and also instructed to off load the foot as much as possible. follow up in 2 weeks.   Follow-Up Instructions: Return in about 2 weeks (around 05/26/2018).   Ortho Exam  [name] alert, oriented, no adenopathy, well-dressed, normal affect, normal respiratory effort. The right great toe amputation site is clean, dry and intact. Still with moderate edema localized to the site, proximal foot without edema. Good pedal pulses. No signs of infection or cellulitis. Sutures removed this visit..   Imaging: No results found. No images are attached to the encounter.  Labs: Lab Results  Component Value Date   HGBA1C 7.4 (H) 04/23/2018   HGBA1C 6.5 (A) 01/31/2018   HGBA1C 9.9 (H) 10/31/2017   ESRSEDRATE 31 (H) 02/14/2017   CRP 12.3 (H) 02/14/2017   REPTSTATUS 02/20/2017 FINAL 02/14/2017   REPTSTATUS 02/20/2017 FINAL 02/14/2017   CULT  02/14/2017    NO GROWTH 5 DAYS Performed at  [facility], [address] [place], [place] [address]    CULT  02/14/2017    NO GROWTH 5 DAYS Performed at [facility], [address] [place], [place] [address]      Lab Results  Component Value Date   ALBUMIN 4.1 01/31/2018   ALBUMIN 3.7 10/30/2017   ALBUMIN 3.0 (L) 02/15/2017   PREALBUMIN 13.8 (L) 02/15/2017    Body mass index is 26.32 kg/m.  Orders:  No orders of the defined types were placed in this encounter.  No orders of the defined types were placed in this encounter.    Procedures: No procedures performed  Clinical Data: No additional findings.  ROS:  All other systems negative, except as noted in the HPI. Review of Systems  Objective: Vital Signs: Ht 6\' 2"  (1.88 m)   Wt 205 lb (93 kg)   BMI 26.32 kg/m   Specialty Comments:  No specialty comments available.  PMFS History: Patient Active Problem List   Diagnosis Date Noted  . TIA (transient ischemic attack) 10/30/2017  . Medication monitoring encounter 03/28/2017  . Status post amputation of toe of right foot (HCC) 03/05/2017  . Subacute osteomyelitis, right ankle and foot (HCC)   . Peripheral neuropathy 02/16/2017  . Peripheral  artery disease (HCC) 02/16/2017  . 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 Diabetic foot infection (HCC) 02/14/2017  . Type II diabetes mellitus with renal manifestations (HCC)   . HLD (hyperlipidemia)   . Tobacco abuse   . CKD stage 2 due to type 2 diabetes mellitus (HCC)   . CAD (coronary artery disease)   . Seizure disorder [facility])    Past Medical History:  Diagnosis Date  . CAD (coronary artery disease)   . CKD (chronic kidney disease), stage III (HCC)   . 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 History of DVT (deep vein thrombosis)   . History of kidney stones   . HLD (hyperlipidemia)   . Myocardial infarction (HCC) 2009  . Peripheral vascular disease (HCC)   . Seizure (HCC)    last one  2015  . Stroke [facility]) 2012, 10/2017   2012-speech was effected- has come back.10/2017- numbness of right side no taste right  side. 2012-  . Tobacco abuse     Family History  Problem Relation Age of Onset  . Diabetes Mellitus II Mother   . Diabetes Mellitus II Father     Past Surgical History:  Procedure Laterality Date  . AMPUTATION Right 02/23/2017   Procedure: RIGHT FOOT 5TH RAY AMPUTATION;  Surgeon: [name], MD;  Location: [facility] OR;  Service: Orthopedics;  Laterality: Right;  . AMPUTATION Right 04/23/2018   Procedure: Right Great Toe Amputation;  Surgeon: [name], MD;  Location: [facility];  Service: Orthopedics;  Laterality: Right;  . CORONARY ANGIOPLASTY  03/05/2008   stent  . FOOT SURGERY    . PERIPHERAL ARTERIAL STENT GRAFT Right    leg 2 stents  . STENT PLACEMENT VASCULAR ([facility] HX)     Several in right leg   Social History   Occupational History  . Not on file  Tobacco Use  . Smoking status: Light Tobacco Smoker    Years: 15.00    Types: Cigarettes  . Smokeless tobacco: Never Used  . Tobacco comment: less than a pack a week  Substance and Sexual Activity  . Alcohol use: No    Comment: seldom  . Drug use: No  . Sexual activity: Not on file

## 2018-05-28 ENCOUNTER — Encounter (INDEPENDENT_AMBULATORY_CARE_PROVIDER_SITE_OTHER): Payer: Self-pay | Admitting: Family

## 2018-05-28 ENCOUNTER — Ambulatory Visit (INDEPENDENT_AMBULATORY_CARE_PROVIDER_SITE_OTHER): Payer: Managed Care, Other (non HMO) | Admitting: Family

## 2018-05-28 VITALS — Ht 74.0 in | Wt 205.0 lb

## 2018-05-28 DIAGNOSIS — Z89421 Acquired absence of other right toe(s): Secondary | ICD-10-CM

## 2018-05-28 MED ORDER — SILVER SULFADIAZINE 1 % EX CREA: 1.0000 "application " | TOPICAL_CREAM | Freq: Every day | CUTANEOUS | 0 refills | Status: DC

## 2018-05-28 MED ORDER — SULFAMETHOXAZOLE-TRIMETHOPRIM 800-160 MG PO TABS: 1.0000 | ORAL_TABLET | Freq: Two times a day (BID) | ORAL | 0 refills | Status: DC

## 2018-05-28 NOTE — Progress Notes (Signed)
Post-Op Visit Note   Patient: Curtis Bradford           Date of Birth: [birth year]           MRN: [id number] Visit Date: 05/28/2018 PCP: Patient, No Pcp Per  Chief Complaint:  Chief Complaint  Patient presents with  . Right Foot - Routine Post Op    Right Great Toe amp    HPI:  HPI The patient is a 57 year old gentleman who is seen today status post right great toe amputation.  He is also status post a remote second toe amputation.  Today he is concerned for swelling and drainage from the amputation site has had some callus buildup along the incision.  Has been feeling poorly overall.  No fevers or chills.  States his blood sugars have been running high about 170.  Is currently using Humalog.  Vascular evaluation in May of this year.  Ortho Exam Oral temp today 97.9  On examination of the right foot the incision has healed well however there is callus buildup along the incision in the forefoot.  There is a draining ulcer serous fluid draining to the plantar aspect of the forefoot.  Ulceration is 6 cm x 4 cm this was debrided with a 10 blade knife.  Underlying granulation and fascia exposed there is no purulence.  No bleeding.  Does have a palpable dorsalis pedis pulse.  Visit Diagnoses: No diagnosis found.  Plan: Xeroform dressing applied today.  We will cleanse this daily with Dial soap and water.  Silvadene dressing.  Nonweightbearing on the forefoot.  Darco shoe provided.  Advised to minimize his weightbearing.  Also will provide an antibiotic to take orally.  Follow-up as scheduled on Monday.  Follow-Up Instructions: Return in about 1 week (around 06/04/2018).   Imaging: No results found.  Orders:  No orders of the defined types were placed in this encounter.  Meds ordered this encounter  Medications  . silver sulfADIAZINE (SILVADENE) 1 % cream    Sig: Apply 1 application topically daily.    Dispense:  50 g    Refill:  0  . sulfamethoxazole-trimethoprim (BACTRIM  DS,SEPTRA DS) 800-160 MG tablet    Sig: Take 1 tablet by mouth 2 (two) times daily.    Dispense:  20 tablet    Refill:  0     PMFS History: Patient Active Problem List   Diagnosis Date Noted  . TIA (transient ischemic attack) 10/30/2017  . Medication monitoring encounter 03/28/2017  . Status post amputation of toe of right foot (HCC) 03/05/2017  . Subacute osteomyelitis, right ankle and foot (HCC)   . Peripheral neuropathy 02/16/2017  . Peripheral artery disease (HCC) 02/16/2017  . 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 Diabetic foot infection (HCC) 02/14/2017  . Type II diabetes mellitus with renal manifestations (HCC)   . HLD (hyperlipidemia)   . Tobacco abuse   . CKD stage 2 due to type 2 diabetes mellitus (HCC)   . CAD (coronary artery disease)   . Seizure disorder [facility](HCC)    Past Medical History:  Diagnosis Date  . CAD (coronary artery disease)   . CKD (chronic kidney disease), stage III (HCC)   . 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 History of DVT (deep vein thrombosis)   . History of kidney stones   . HLD (hyperlipidemia)   . Myocardial infarction (HCC) 2009  . Peripheral vascular disease (HCC)   .  Seizure (HCC)    last one 2015  . Stroke [facility]) 2012, 10/2017   2012-speech was effected- has come back.10/2017- numbness of right side no taste right  side. 2012-  . Tobacco abuse     Family History  Problem Relation Age of Onset  . Diabetes Mellitus II Mother   . Diabetes Mellitus II Father     Past Surgical History:  Procedure Laterality Date  . AMPUTATION Right 02/23/2017   Procedure: RIGHT FOOT 5TH RAY AMPUTATION;  Surgeon: [name], MD;  [facility] OR;  Service: Orthopedics;  Laterality: Right;  . AMPUTATION Right 04/23/2018   Procedure: Right Great Toe Amputation;  Surgeon: [name], MD;  Location: [facility] OR;  Service: Orthopedics;  Laterality: Right;  . CORONARY ANGIOPLASTY  03/05/2008   stent  .  FOOT SURGERY    . PERIPHERAL ARTERIAL STENT GRAFT Right    leg 2 stents  . STENT PLACEMENT VASCULAR ([facility] HX)     Several in right leg   Social History   Occupational History  . Not on file  Tobacco Use  . Smoking status: Light Tobacco Smoker    Years: 15.00    Types: Cigarettes  . Smokeless tobacco: Never Used  . Tobacco comment: less than a pack a week  Substance and Sexual Activity  . Alcohol use: No    Comment: seldom  . Drug use: No  . Sexual activity: Not on file

## 2018-06-02 ENCOUNTER — Encounter (INDEPENDENT_AMBULATORY_CARE_PROVIDER_SITE_OTHER): Payer: Self-pay | Admitting: Orthopedic Surgery

## 2018-06-02 ENCOUNTER — Encounter (INDEPENDENT_AMBULATORY_CARE_PROVIDER_SITE_OTHER): Payer: Self-pay | Admitting: Physician Assistant

## 2018-06-02 ENCOUNTER — Ambulatory Visit (INDEPENDENT_AMBULATORY_CARE_PROVIDER_SITE_OTHER): Payer: Managed Care, Other (non HMO) | Admitting: Physician Assistant

## 2018-06-02 VITALS — Ht 74.0 in | Wt 205.0 lb

## 2018-06-02 DIAGNOSIS — N182 Chronic kidney disease, stage 2 (mild): Secondary | ICD-10-CM

## 2018-06-02 DIAGNOSIS — E1122 Type 2 diabetes mellitus with diabetic chronic kidney disease: Secondary | ICD-10-CM

## 2018-06-02 DIAGNOSIS — L97509 Non-pressure chronic ulcer of other part of unspecified foot with unspecified severity: Secondary | ICD-10-CM

## 2018-06-02 DIAGNOSIS — Z89421 Acquired absence of other right toe(s): Secondary | ICD-10-CM

## 2018-06-02 DIAGNOSIS — E11621 Type 2 diabetes mellitus with foot ulcer: Secondary | ICD-10-CM

## 2018-06-02 DIAGNOSIS — Z794 Long term (current) use of insulin: Secondary | ICD-10-CM

## 2018-06-02 NOTE — Progress Notes (Unsigned)
Office Visit Note   Patient: Curtis Bradford           Date of Birth: 03/10/1961           MRN: [id number] Visit Date: 06/02/2018              Requested by: No referring provider defined for this encounter. PCP: Patient, No Pcp Per  No chief complaint on file.     HPI: ***  Assessment & Plan: Visit Diagnoses: No diagnosis found.  Plan: ***  Follow-Up Instructions: No follow-ups on file.   Ortho Exam  [name] alert, oriented, no adenopathy, well-dressed, normal affect, normal respiratory effort. ***  Imaging: No results found. No images are attached to the encounter.  Labs: Lab Results  Component Value Date   HGBA1C 7.4 (H) 04/23/2018   HGBA1C 6.5 (A) 01/31/2018   HGBA1C 9.9 (H) 10/31/2017   ESRSEDRATE 31 (H) 02/14/2017   CRP 12.3 (H) 02/14/2017   REPTSTATUS 02/20/2017 FINAL 02/14/2017   REPTSTATUS 02/20/2017 FINAL 02/14/2017   CULT  02/14/2017    NO GROWTH 5 DAYS Performed at [facility], [address] [place], [place] [address]    CULT  02/14/2017    NO GROWTH 5 DAYS Performed at [facility], [address], [place], [place] [address]      Lab Results  Component Value Date   ALBUMIN 4.1 01/31/2018   ALBUMIN 3.7 10/30/2017   ALBUMIN 3.0 (L) 02/15/2017   PREALBUMIN 13.8 (L) 02/15/2017    There is no height or weight on file to calculate BMI.  Orders:  No orders of the defined types were placed in this encounter.  No orders of the defined types were placed in this encounter.    Procedures: No procedures performed  Clinical Data: No additional findings.  ROS:  All other systems negative, except as noted in the HPI. Review of Systems  Objective: Vital Signs: There were no vitals taken for this visit.  Specialty Comments:  No specialty comments available.  PMFS History: Patient Active Problem List   Diagnosis Date Noted  . TIA (transient ischemic attack) 10/30/2017  . Medication monitoring encounter 03/28/2017    . Status post amputation of toe of right foot (HCC) 03/05/2017  . Subacute osteomyelitis, right ankle and foot (HCC)   . Peripheral neuropathy 02/16/2017  . Peripheral artery disease (HCC) 02/16/2017  . 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 Diabetic foot infection (HCC) 02/14/2017  . Type II diabetes mellitus with renal manifestations (HCC)   . HLD (hyperlipidemia)   . Tobacco abuse   . CKD stage 2 due to type 2 diabetes mellitus (HCC)   . CAD (coronary artery disease)   . Seizure disorder [facility])    Past Medical History:  Diagnosis Date  . CAD (coronary artery disease)   . CKD (chronic kidney disease), stage III (HCC)   . 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 History of DVT (deep vein thrombosis)   . History of kidney stones   . HLD (hyperlipidemia)   . Myocardial infarction (HCC) 2009  . Peripheral vascular disease (HCC)   . Seizure (HCC)    last one 2015  . Stroke [facility]) 2012, 10/2017   2012-speech was effected- has come back.10/2017- numbness of right side no taste right  side. 2012-  . Tobacco abuse     Family History  Problem Relation Age of Onset  . Diabetes  Mellitus II Mother   . Diabetes Mellitus II Father     Past Surgical History:  Procedure Laterality Date  . AMPUTATION Right 02/23/2017   Procedure: RIGHT FOOT 5TH RAY AMPUTATION;  Surgeon: Nadara Mustarduda, Marcus V, MD;  [facility];  Service: Orthopedics;  Laterality: Right;  . AMPUTATION Right 04/23/2018   Procedure: Right Great Toe Amputation;  Surgeon: Nadara Mustarduda, Marcus V, MD;  [facility];  Service: Orthopedics;  Laterality: Right;  . CORONARY ANGIOPLASTY  03/05/2008   stent  . FOOT SURGERY    . PERIPHERAL ARTERIAL STENT GRAFT Right    leg 2 stents  . STENT PLACEMENT VASCULAR ([facility] HX)     Several in right leg   Social History   Occupational History  . Not on file  Tobacco Use  . Smoking status: Light Tobacco Smoker    Years: 15.00    Types: Cigarettes   . Smokeless tobacco: Never Used  . Tobacco comment: less than a pack a week  Substance and Sexual Activity  . Alcohol use: No    Comment: seldom  . Drug use: No  . Sexual activity: Not on file

## 2018-06-02 NOTE — Progress Notes (Signed)
Office Visit Note   Patient: Curtis Bradford           Date of Birth: 1961-03-11           MRN: [id number] Visit Date: 06/02/2018              Requested by: No referring provider defined for this encounter. PCP: Patient, No Pcp Per  Chief Complaint  Patient presents with  . Right Foot - Routine Post Op    04/23/18 right GT amp       HPI: The patient is a 57 yo gentleman who is seen for post operative follow up following right great toe amputation 04/23/18. He is about 6 weeks post op. He has been wearing his steel toed boots as he has been working. He reports increased drainage and blisters over the foot. He reports high blood sugars. He is on Doxycycline and using silvadene cream to the right foot .   Assessment & Plan: Visit Diagnoses:  1. Status post amputation of toe of right foot (HCC)   2. Type 2 diabetes mellitus with foot ulcer, with long-term current use of insulin (HCC)   3. CKD stage 2 due to type 2 diabetes mellitus (HCC)     Plan: The blister and skin and soft tissue and callus were debrided from the area to healthier looking tissue. We discussed that he needs to remain off the right foot as much as a possible for the next week due to increased ulcer, blistering. He will continue Doxycycline and silvadene cream to the right foot daily. He should elevate and stay off the right foot as much as possible over the next week. Given a note to stay out of work for the next week.   Follow-Up Instructions: Return in about 1 week (around 06/09/2018).   Ortho Exam  [name] alert, oriented, no adenopathy, well-dressed, normal affect, normal respiratory effort. There is blistering about the 1st MT head with callus and a central ulcer to skin level ~ 2 cm with surrounding maceration to 4 cm. Also 2 small ulcers to skin layer over the dorsum of the right foot to 1 cm each without drainage. There is maceration of the surrounding tissue. There are palpable pedal pulses.  Imaging: No  results found. No images are attached to the encounter.  Labs: Lab Results  Component Value Date   HGBA1C 7.4 (H) 04/23/2018   HGBA1C 6.5 (A) 01/31/2018   HGBA1C 9.9 (H) 10/31/2017   ESRSEDRATE 31 (H) 02/14/2017   CRP 12.3 (H) 02/14/2017   REPTSTATUS 02/20/2017 FINAL 02/14/2017   REPTSTATUS 02/20/2017 FINAL 02/14/2017   CULT  02/14/2017    NO GROWTH 5 DAYS Performed at [facility], [address] [place], [place] [address]    CULT  02/14/2017    NO GROWTH 5 DAYS Performed at [facility], [address] [place], [place] [address]      Lab Results  Component Value Date   ALBUMIN 4.1 01/31/2018   ALBUMIN 3.7 10/30/2017   ALBUMIN 3.0 (L) 02/15/2017   PREALBUMIN 13.8 (L) 02/15/2017    Body mass index is 26.32 kg/m.  Orders:  No orders of the defined types were placed in this encounter.  No orders of the defined types were placed in this encounter.    Procedures: No procedures performed  Clinical Data: No additional findings.  ROS:  All other systems negative, except as noted in the HPI. Review of Systems  Objective: Vital Signs:  Ht 6\' 2"  (1.88 m)   Wt 205 lb (93 kg)   BMI 26.32 kg/m   Specialty Comments:  No specialty comments available.  PMFS History: Patient Active Problem List   Diagnosis Date Noted  . TIA (transient ischemic attack) 10/30/2017  . Medication monitoring encounter 03/28/2017  . Status post amputation of toe of right foot (HCC) 03/05/2017  . Subacute osteomyelitis, right ankle and foot (HCC)   . Peripheral neuropathy 02/16/2017  . Peripheral artery disease (HCC) 02/16/2017  . 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 Diabetic foot infection (HCC) 02/14/2017  . Type II diabetes mellitus with renal manifestations (HCC)   . HLD (hyperlipidemia)   . Tobacco abuse   . CKD stage 2 due to type 2 diabetes mellitus (HCC)   . CAD (coronary artery disease)   . Seizure disorder [facility](HCC)    Past  Medical History:  Diagnosis Date  . CAD (coronary artery disease)   . CKD (chronic kidney disease), stage III (HCC)   . 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 History of DVT (deep vein thrombosis)   . History of kidney stones   . HLD (hyperlipidemia)   . Myocardial infarction (HCC) 2009  . Peripheral vascular disease (HCC)   . Seizure (HCC)    last one 2015  . Stroke [facility](HCC) 2012, 10/2017   2012-speech was effected- has come back.10/2017- numbness of right side no taste right  side. 2012-  . Tobacco abuse     Family History  Problem Relation Age of Onset  . Diabetes Mellitus II Mother   . Diabetes Mellitus II Father     Past Surgical History:  Procedure Laterality Date  . AMPUTATION Right 02/23/2017   Procedure: RIGHT FOOT 5TH RAY AMPUTATION;  Surgeon: Nadara Mustarduda, Marcus V, MD;  [facility];  Service: Orthopedics;  Laterality: Right;  . AMPUTATION Right 04/23/2018   Procedure: Right Great Toe Amputation;  Surgeon: Nadara Mustarduda, Marcus V, MD;  [facility];  Service: Orthopedics;  Laterality: Right;  . CORONARY ANGIOPLASTY  03/05/2008   stent  . FOOT SURGERY    . PERIPHERAL ARTERIAL STENT GRAFT Right    leg 2 stents  . STENT PLACEMENT VASCULAR ([facility] HX)     Several in right leg   Social History   Occupational History  . Not on file  Tobacco Use  . Smoking status: Light Tobacco Smoker    Years: 15.00    Types: Cigarettes  . Smokeless tobacco: Never Used  . Tobacco comment: less than a pack a week  Substance and Sexual Activity  . Alcohol use: No    Comment: seldom  . Drug use: No  . Sexual activity: Not on file

## 2018-06-09 ENCOUNTER — Ambulatory Visit (INDEPENDENT_AMBULATORY_CARE_PROVIDER_SITE_OTHER): Payer: Managed Care, Other (non HMO) | Admitting: Orthopedic Surgery

## 2018-06-09 ENCOUNTER — Encounter (INDEPENDENT_AMBULATORY_CARE_PROVIDER_SITE_OTHER): Payer: Self-pay | Admitting: Orthopedic Surgery

## 2018-06-09 VITALS — Ht 74.0 in | Wt 205.0 lb

## 2018-06-09 DIAGNOSIS — Z89421 Acquired absence of other right toe(s): Secondary | ICD-10-CM

## 2018-06-09 NOTE — Progress Notes (Signed)
Office Visit Note   Patient: Curtis Bradford           Date of Birth: 1960-10-12           MRN: [id number] Visit Date: 06/09/2018              Requested by: No referring provider defined for this encounter. PCP: Patient, No Pcp Per  Chief Complaint  Patient presents with  . Right Foot - Follow-up    Right GT amputation 04/23/18       HPI: [name] a 57 year old gentleman who presents status post right great toe amputation.  He is currently full weightbearing he has been using Silvadene on gauze and is completing a course of doxycycline.  Patient complains of swelling discoloration denies any odor denies any drainage.  Assessment & Plan: Visit Diagnoses:  1. Status post amputation of toe of right foot (HCC)     Plan: A felt relieving donut will be fabricated to unload pressure from the first metatarsal head.  Discussed the importance of minimizing weightbearing.  Patient will have his primary care physician fill out the paperwork for new orthotics.  We will complete paperwork for FMLA.  Follow-Up Instructions: Return in about 4 weeks (around 07/07/2018).   Ortho Exam  [name] alert, oriented, no adenopathy, well-dressed, normal affect, normal respiratory effort. Examination patient's surgical incision is well-healed.  He has a very superficial Wegner grade 1 ulcer beneath the first metatarsal head which is 10 mm in diameter 0.1 mm deep.  A Band-Aid was applied.  [name] also status post 1/5 ray amputation on the right.  This has healed well.  Patient has a cavovarus foot and is overloading the first metatarsal head.  Imaging: No results found. No images are attached to the encounter.  Labs: Lab Results  Component Value Date   HGBA1C 7.4 (H) 04/23/2018   HGBA1C 6.5 (A) 01/31/2018   HGBA1C 9.9 (H) 10/31/2017   ESRSEDRATE 31 (H) 02/14/2017   CRP 12.3 (H) 02/14/2017   REPTSTATUS 02/20/2017 FINAL 02/14/2017   REPTSTATUS 02/20/2017 FINAL 02/14/2017   CULT  02/14/2017      NO GROWTH 5 DAYS Performed at [facility], [address] [place], [place] [address]    CULT  02/14/2017    NO GROWTH 5 DAYS Performed at [facility], [address], [place], [place] [address]      Lab Results  Component Value Date   ALBUMIN 4.1 01/31/2018   ALBUMIN 3.7 10/30/2017   ALBUMIN 3.0 (L) 02/15/2017   PREALBUMIN 13.8 (L) 02/15/2017    Body mass index is 26.32 kg/m.  Orders:  No orders of the defined types were placed in this encounter.  No orders of the defined types were placed in this encounter.    Procedures: No procedures performed  Clinical Data: No additional findings.  ROS:  All other systems negative, except as noted in the HPI. Review of Systems  Objective: Vital Signs: Ht 6\' 2"  (1.88 m)   Wt 205 lb (93 kg)   BMI 26.32 kg/m   Specialty Comments:  No specialty comments available.  PMFS History: Patient Active Problem List   Diagnosis Date Noted  . TIA (transient ischemic attack) 10/30/2017  . Medication monitoring encounter 03/28/2017  . Status post amputation of toe of right foot (HCC) 03/05/2017  . Subacute osteomyelitis, right ankle and foot (HCC)   . Peripheral neuropathy 02/16/2017  . Peripheral artery disease (HCC) 02/16/2017  . Type 2  diabetes mellitus with diabetic foot ulcer (HCC) 02/14/2017  . Diabetic foot infection (HCC) 02/14/2017  . Type II diabetes mellitus with renal manifestations (HCC)   . HLD (hyperlipidemia)   . Tobacco abuse   . CKD stage 2 due to type 2 diabetes mellitus (HCC)   . CAD (coronary artery disease)   . Seizure disorder [facility](HCC)    Past Medical History:  Diagnosis Date  . CAD (coronary artery disease)   . CKD (chronic kidney disease), stage III (HCC)   . 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 History of DVT (deep vein thrombosis)   . History of kidney stones   . HLD (hyperlipidemia)   . Myocardial infarction (HCC) 2009  . Peripheral vascular disease (HCC)    . Seizure (HCC)    last one 2015  . Stroke [facility](HCC) 2012, 10/2017   2012-speech was effected- has come back.10/2017- numbness of right side no taste right  side. 2012-  . Tobacco abuse     Family History  Problem Relation Age of Onset  . Diabetes Mellitus II Mother   . Diabetes Mellitus II Father     Past Surgical History:  Procedure Laterality Date  . AMPUTATION Right 02/23/2017   Procedure: RIGHT FOOT 5TH RAY AMPUTATION;  Surgeon: Nadara Mustarduda, Marcus V, MD;  [facility];  Service: Orthopedics;  Laterality: Right;  . AMPUTATION Right 04/23/2018   Procedure: Right Great Toe Amputation;  Surgeon: Nadara Mustarduda, Marcus V, MD;  [facility];  Service: Orthopedics;  Laterality: Right;  . CORONARY ANGIOPLASTY  03/05/2008   stent  . FOOT SURGERY    . PERIPHERAL ARTERIAL STENT GRAFT Right    leg 2 stents  . STENT PLACEMENT VASCULAR ([facility] HX)     Several in right leg   Social History   Occupational History  . Not on file  Tobacco Use  . Smoking status: Light Tobacco Smoker    Years: 15.00    Types: Cigarettes  . Smokeless tobacco: Never Used  . Tobacco comment: less than a pack a week  Substance and Sexual Activity  . Alcohol use: No    Comment: seldom  . Drug use: No  . Sexual activity: Not on file

## 2018-06-09 NOTE — Progress Notes (Addendum)
[facility] [address] [place]. [place] [address] [phone]       OFFICE FOLLOW UP NOTE  Mr. Landan Fedie Date of Birth:  1960/07/20 Medical Record Number:  [id number]   Reason for visit: Stroke follow up  CHIEF COMPLAINT:  Chief Complaint  Patient presents with  . Follow-up    Stroke follow up room 9 pt alone pt hsa not found a primary doctor yet he was given a list by Shanda Bumps NP    HPI: Siraj Dermody is being seen today in the office for left thalamic infarct on 10/30/2017. History obtained from patient and chart review. Reviewed all radiology images and labs personally.  Patient 57 year-old male with history of CAD/MI in 2009, CKD, DM, current smoker, PVD with multiple stents, HLD, one-time seizure due to metformin overdose, left MCA stroke in 2012 who was admitted for right-sided numbness for 4 days.  CT head showed left parietal MCA old infarct, left thalamic subacute infarct.  MRI showed acute left thalamic infarct and old left MCA and right thalamus infarct.  MRA unremarkable.  CTA neck with mild arthrosclerosis but no significant stenosis.  2D echo showed an EF of 55 to 60%.  LDL satisfactory at 38.  A1c elevated at 9.9.  Patient had stroke in 2012 with sudden onset aphasia.  Admitted to hospital in [place].    It was unsure about a stroke work-up over there, however patient stated that he was not told any etiology for the stroke.  It took him 6 months to get speech back.  He also had MI in 2009, and multiple lower extremity stent for PVD. In 2016, patient did have one seizure after accidentally overdosing on metformin and since discontinuation of metformin, he denies seizure activity but has continued on Keppra.  Patient does have a history of right leg DVT 5 years ago and eventually received 2 stents that were subsequently clogged, unclogged, and clogged then additional 3 stents were placed/replaced.  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  Given history of embolic pattern stroke in 2012, it was decided to further work-up with a TCD bubble study which was negative for PFO, lower extremity venous Dopplers were negative for DVT and hypercoagulable work-up negative.  Recommended increasing Lipitor 20 mg to 80 mg for cholesterol control.  Also recommended close outpatient follow-up with PCP regarding elevated A1c level.  Patient was advised to quit smoking as he is an active smoker.  Patient discharged home in stable condition.    12/03/2017 visit: [name] being seen today for hospital follow-up.  He continues to have complaints of right-sided numbness along with facial numbness but has been improving.  He states when he first was discharged from the hospital he was unable to pull something out of his pocket as he was unable to feel anything but now he is able to see on object and pull it out.  He continues to take both aspirin and Plavix with mild bruising but no bleeding.  Continues to take Lipitor without side effects of myalgias.  Blood pressure satisfactory 121/82.Patient has returned to all previous activities including work and driving.   He does have recent complaint of ringing sensation in his right ear along with coughing spells that has been occurring for approximately 2 weeks with mild postnasal drip.  He is using Mucinex DM which has been helping some.  Patient moved to the [place] area from [place] last October and has not found a PCP at this time.  Patient does have many medications for diabetic control.  Denies new or worsening stroke/TIA symptoms.  04/09/2018 visit: Patient returns today for scheduled follow-up visit.  He has been doing well from a stroke standpoint without residual deficits or recurring of symptoms.  He has continued taking both aspirin and Plavix without side effects of bleeding or bruising.  Continues to take Lipitor  without side effects myalgias.  He was seen by endocrinology who did lab work on 01/31/2018 which showed LDL 22, cholesterol 86, triglycerides 200 and HDL 23.9.  Also A1c obtained which showed 6.5.  He continues to work without complication and continues to do all prior activities.  Blood pressure today 140/70.  Referral was placed for nephrology as well as internal medicine for PCP establishment but has not received a call from either offices.  Patient was provided with list of PCP offices in the [place] area along with phone number to contact nephrology due to continued elevated creatinine level.  Patient denies any recent seizure activity and continues to be compliant with Keppra thousand milligrams nightly.  Patient questioning whether this can start to be tapered off.  No further concerns at this time.  Denies new or worsening stroke/TIA symptoms.  Interval history 06/10/18: [name] being seen today for 44-month follow-up visit.  Patient did undergo right great toe amputation on 04/23/2018. He has had some complications including blistering due to continuing to work and was off from work for 1 weeks time. Last night was his first night back to work.  He does endorse some pain and swelling but is able to tolerate. He has decreased lipitor and started on Zetia. He denies any side effects. He continues on aspirin and plavix without side effects of bleeding or bruising. He has not followed up with nephrology due to a lot going on with his recent toe amputation.  He has had recent lab work which showed improvement of his creatinine at 1.54 which decreased from 1.81.  He does state he has been having a difficult time controlling glucose levels since his amputation which have been fluctuating and he does continue to follow with endocrinology for management. He does continue on keppra 500mg  without any recent seizure activity. He continues to have numbness and tingling on right upper and lower extremity which has  been present since his stroke. Denies nerve pain associated. No further concerns at this time. Denies new or worsening stroke/TIA symptoms.      ROS:   14 system review of systems performed and negative with exception of numbness  PMH:  Past Medical History:  Diagnosis Date  . CAD (coronary artery disease)   . CKD (chronic kidney disease), stage III (HCC)   . 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 History of DVT (deep vein thrombosis)   . History of kidney stones   . HLD (hyperlipidemia)   . Myocardial infarction (HCC) 2009  . Peripheral vascular disease (HCC)   . Seizure (HCC)    last one 2015  . Stroke [facility]) 2012, 10/2017   2012-speech was effected- has come back.10/2017- numbness of right side no taste right  side. 2012-  . Tobacco abuse     PSH:  Past Surgical History:  Procedure Laterality Date  . AMPUTATION Right 02/23/2017   Procedure: RIGHT FOOT 5TH RAY AMPUTATION;  Surgeon: [name], MD;  Location: [facility] OR;  Service: Orthopedics;  Laterality: Right;  . AMPUTATION Right 04/23/2018   Procedure: Right Great Toe Amputation;  Surgeon: [name], MD;  Location: [facility] OR;  Service: Orthopedics;  Laterality: Right;  . CORONARY ANGIOPLASTY  03/05/2008   stent  . FOOT SURGERY    . PERIPHERAL ARTERIAL STENT GRAFT Right    leg 2 stents  . STENT PLACEMENT VASCULAR ([facility] HX)     Several in right leg    Social History:  Social History   Socioeconomic History  . Marital status: Unknown    Spouse name: Not on file  . Number of children: Not on file  . Years of education: Not on file  . Highest education level: Not on file  Occupational History  . Not on file  Social Needs  . Financial resource strain: Not on file  . Food insecurity:    Worry: Not on file    Inability: Not on file  . Transportation needs:    Medical: Not on file    Non-medical: Not on file  Tobacco Use  . Smoking status: Light Tobacco Smoker    Years: 15.00    Types:  Cigarettes  . Smokeless tobacco: Never Used  . Tobacco comment: less than a pack a week  Substance and Sexual Activity  . Alcohol use: No    Comment: seldom  . Drug use: No  . Sexual activity: Not on file  Lifestyle  . Physical activity:    Days per week: Not on file    Minutes per session: Not on file  . Stress: Not on file  Relationships  . Social connections:    Talks on phone: Not on file    Gets together: Not on file    Attends religious service: Not on file    Active member of club or organization: Not on file    Attends meetings of clubs or organizations: Not on file    Relationship status: Not on file  . Intimate partner violence:    Fear of current or ex partner: Not on file    Emotionally abused: Not on file    Physically abused: Not on file    Forced sexual activity: Not on file  Other Topics Concern  . Not on file  Social History Narrative  . Not on file    Family History:  Family History  Problem Relation Age of Onset  . Diabetes Mellitus II Mother   . Diabetes Mellitus II Father     Medications:   Current Outpatient Medications on File Prior to Visit  Medication Sig Dispense Refill  . aspirin EC 81 MG tablet Take 81 mg by mouth daily.     Marland Kitchen atorvastatin (LIPITOR) 20 MG tablet Take 1 tablet (20 mg total) by mouth daily. 90 tablet 3  . clopidogrel (PLAVIX) 75 MG tablet Take 1 tablet (75 mg total) by mouth at bedtime. (Patient taking differently: Take 75 mg by mouth daily. ) 90 tablet 4  . Continuous Blood Gluc Sensor (FREESTYLE LIBRE 14 DAY SENSOR) MISC Place 1 patch onto the skin every 14 (fourteen) days. 6 each 2  . HYDROcodone-acetaminophen (NORCO/VICODIN) 5-325 MG tablet Take 1-2 tablets by mouth every 4 (four) hours as needed for moderate pain. 30 tablet 0  . insulin glargine (LANTUS) 100 unit/mL SOPN Inject 0.37 mLs (37 Units total) into the skin at bedtime. (Patient taking differently: Inject 37 Units into the skin daily. ) 15 mL 3  . insulin lispro  (  HUMALOG KWIKPEN) 100 UNIT/ML KiwkPen Inject 0-0.15 mLs (0-15 Units total) into the skin 2 (two) times daily. Typically takes 4-6 units per meal. Per sliding scale (Patient taking differently: Inject 4-12 Units into the skin 3 (three) times daily as needed (high blood sugar). ) 15 mL 11  . linagliptin (TRADJENTA) 5 MG TABS tablet Take 1 tablet (5 mg total) by mouth daily. 30 tablet 3  . silver sulfADIAZINE (SILVADENE) 1 % cream Apply 1 application topically daily. 50 g 0  . sulfamethoxazole-trimethoprim (BACTRIM DS,SEPTRA DS) 800-160 MG tablet Take 1 tablet by mouth 2 (two) times daily. 20 tablet 0   No current facility-administered medications on file prior to visit.     Allergies:  No Known Allergies   Physical Exam  Vitals:   06/10/18 0828  BP: (!) 142/80  Pulse: 92  Weight: 206 lb 3.2 oz (93.5 kg)   Body mass index is 26.47 kg/m. No exam data present  General: well developed, pleasant middle-aged Caucasian male, well nourished, seated, in no evident distress Head: head normocephalic and atraumatic.   Neck: supple with no carotid or supraclavicular bruits Cardiovascular: regular rate and rhythm, no murmurs Musculoskeletal: no deformity Skin:  no rash/petichiae Vascular:  Normal pulses all extremities  Neurologic Exam Mental Status: Awake and fully alert. Oriented to place and time. Recent and remote memory intact. Attention span, concentration and fund of knowledge appropriate. Mood and affect appropriate.  Cranial Nerves: Fundoscopic exam reveals sharp disc margins. Pupils equal, briskly reactive to light. Extraocular movements full without nystagmus. Visual fields full to confrontation. Hearing intact. Facial sensation intact. Face, tongue, palate moves normally and symmetrically.  Motor: Normal bulk and tone. Normal strength in all tested extremity muscles. Sensory.:  Decreased sensation bilateral lower extremities distally along with decreased sensation on right upper and  lower extremity Coordination: Rapid alternating movements normal in all extremities. Finger-to-nose and heel-to-shin performed accurately bilaterally. Gait and Station: Arises from chair without difficulty. Stance is normal. Gait demonstrates normal stride length and balance .  Did not test tandem gait or heel/toe due to recent amputation Reflexes: 1+ and symmetric. Toes downgoing.      Diagnostic Data (Labs, Imaging, Testing)  CT HEAD WO CONTRAST 10/30/2017 IMPRESSION: Old left parietal and thalamic infarcts without acute intracranial Abnormality.  MR BRAIN WO CONTRAST MR MRA HEAD WO CONTRAST 10/31/2017 IMPRESSION: MRI HEAD IMPRESSION 1. 9 mm acute ischemic nonhemorrhagic left thalamic lacunar infarct. 2. Remote left posterior MCA territory infarct, with additional remote lacunar infarcts involving the bilateral thalami and right basal ganglia. 3. Atrophy with mild chronic small vessel ischemic disease. MRA HEAD IMPRESSION 1. Negative intracranial MRA. No large vessel occlusion. No hemodynamically significant or correctable stenosis. 2. Minor atherosclerotic change for age.  CT ANGIO NECK W OR WO CONTRAST 10/31/2017 IMPRESSION: 1. Atherosclerosis without flow limiting stenosis or ulceration. 2. 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  ECHOCARDIOGRAM 10/31/17 Study Conclusions - Left ventricle: The cavity size was normal. Systolic function was   normal. The estimated ejection fraction was in the range of 55%   to 60%. Hypokinesis of the apical myocardium. Doppler parameters   are consistent with abnormal left ventricular relaxation (grade 1   diastolic dysfunction). - Aortic valve: Trileaflet; mildly thickened, mildly calcified   leaflets. - Aortic root: The aortic root was mildly dilated. - Mitral valve: Valve area by pressure half-time: 2.44 cm^2.  VAS [place] LOWER EXTREMITY VENOUS BILAT (DVT) 11/01/17 Final Interpretation: Right: There is no evidence of deep vein thrombosis  in the  lower extremity. No cystic structure found in the popliteal fossa. Left: There is no evidence of deep vein thrombosis in the lower extremity. No cystic structure found in the popliteal fossa.  VAS US TRANSCRANIAL DOPPLER WITH BUBBLES 11/01/17 There is no obvious evidence of high intensity transient signals (HITS), therefore there is no evidence of patent foramen ovale (PFO). Artifact was seen with valsalva maneuver, possible muscular interference.     ASSESSMENT: Herschell DimesJohn Sheard is a 57 y.o. year old male here with acute left thalamic infarct on 10/30/17 secondary to small vessel disease. Vascular risk factors include CKD, DM, PVD, HLD and HTN.  Patient returns today for follow-up visit and overall has been stable from a stroke standpoint with residual deficit of right paresthesias.  He did undergo recent right greater toe amputation on 04/23/2018.   PLAN: -Continue aspirin 81 mg daily and clopidogrel 75 mg daily  and atorvastatin 20 mg and start Zetia 10 mg for secondary stroke prevention -Will obtain lipid panel today to ensure adequate HLD management with current regimen -Continue Keppra 500 mg nightly -discussion with patient regarding possible discontinuation of Keppra but as he has been stable without side effects, we will continue at this time  -Highly encouraged patient to obtain PCP for establish care for chronic management -Continue to follow with endocrinology for DM management -Nephrologist for number provided to patient to call office to schedule appointment for creatinine monitoring -unfortunately, patient did not schedule appointment at this time due to recent toe amputation and consistent appointments with orthopedics.  [name] highly aware that he needs to call and plans on calling to schedule appointment -continue to monitor BP at home -Advised to continue to stay active and maintain a healthy diet -Maintain strict control of hypertension with blood pressure goal below  130/90, diabetes with hemoglobin A1c goal below 6.5% and cholesterol with LDL cholesterol (bad cholesterol) goal below 70 mg/dL. I also advised the patient to eat a healthy diet with plenty of whole grains, cereals, fruits and vegetables, exercise regularly and maintain ideal body weight.  Follow up in 6 months or call earlier if needed   Greater than 50% of time during this 25 minute visit was spent on counseling,explanation of diagnosis of left thalamic infarct, reviewing risk factor management of CKD, DM, PVD, HLD and HTN, planning of further management, discussion with patient and family and coordination of care   George HughJessica Camrin Gearheart, [facility] HospitalGNP-BC  [facility] [address] [place], [place] [id number]  Phone [phone] Fax [phone]

## 2018-06-10 ENCOUNTER — Encounter: Payer: Self-pay | Admitting: Adult Health

## 2018-06-10 ENCOUNTER — Ambulatory Visit (INDEPENDENT_AMBULATORY_CARE_PROVIDER_SITE_OTHER): Payer: Managed Care, Other (non HMO) | Admitting: Adult Health

## 2018-06-10 VITALS — BP 142/80 | HR 92 | Wt 206.2 lb

## 2018-06-10 DIAGNOSIS — I10 Essential (primary) hypertension: Secondary | ICD-10-CM

## 2018-06-10 DIAGNOSIS — E1122 Type 2 diabetes mellitus with diabetic chronic kidney disease: Secondary | ICD-10-CM

## 2018-06-10 DIAGNOSIS — E785 Hyperlipidemia, unspecified: Secondary | ICD-10-CM | POA: Diagnosis not present

## 2018-06-10 DIAGNOSIS — I63512 Cerebral infarction due to unspecified occlusion or stenosis of left middle cerebral artery: Secondary | ICD-10-CM

## 2018-06-10 DIAGNOSIS — Z794 Long term (current) use of insulin: Secondary | ICD-10-CM

## 2018-06-10 DIAGNOSIS — N182 Chronic kidney disease, stage 2 (mild): Secondary | ICD-10-CM

## 2018-06-10 MED ORDER — EZETIMIBE 10 MG PO TABS: 10.0000 mg | ORAL_TABLET | Freq: Every day | ORAL | 3 refills | Status: DC

## 2018-06-10 MED ORDER — LEVETIRACETAM ER 500 MG PO TB24: 500.0000 mg | ORAL_TABLET | Freq: Every day | ORAL | 3 refills | Status: DC

## 2018-06-10 NOTE — Progress Notes (Signed)
I agree with the above plan 

## 2018-06-10 NOTE — Patient Instructions (Addendum)
Continue aspirin 81 mg daily and clopidogrel 75 mg daily  and lipitor and zetia  for secondary stroke prevention  We will check cholesterol levels today to ensure adequate management with current regimen   Please obtain PCP establishment for general management   Continue current dose of keppra 500mg  for seizure prevention   Continue to follow up with endo for diabetes management  Call nephrology to schedule appointment to establish care Dr. Raynelle CharyJoseph Anthony [facility] - nephrology [address], [place], [place] [address] [phone]  Continue to monitor blood pressure at home  Maintain strict control of hypertension with blood pressure goal below 130/90, diabetes with hemoglobin A1c goal below 6.5% and cholesterol with LDL cholesterol (bad cholesterol) goal below 70 mg/dL. I also advised the patient to eat a healthy diet with plenty of whole grains, cereals, fruits and vegetables, exercise regularly and maintain ideal body weight.  Followup in the future with me in 6 months or call earlier if needed        Thank you for coming to see us at [facility]. I hope we have been able to provide you high quality care today.  You may receive a patient satisfaction survey over the next few weeks. We would appreciate your feedback and comments so that we may continue to improve ourselves and the health of our patients.

## 2018-06-11 ENCOUNTER — Other Ambulatory Visit: Payer: Self-pay | Admitting: Adult Health

## 2018-06-11 LAB — LIPID PANEL
CHOL/HDL RATIO: 3.9 ratio (ref 0.0–5.0)
Cholesterol, Total: 93 mg/dL — ABNORMAL LOW (ref 100–199)
HDL: 24 mg/dL — AB (ref >39)
LDL CALC: 2 mg/dL (ref 0–99)
TRIGLYCERIDES: 333 mg/dL — AB (ref 0–149)
VLDL Cholesterol Cal: 67 mg/dL — ABNORMAL HIGH (ref 5–40)

## 2018-06-11 MED ORDER — OMEGA-3-ACID ETHYL ESTERS 1 G PO CAPS: 2.0000 g | ORAL_CAPSULE | Freq: Two times a day (BID) | ORAL | 4 refills | Status: DC

## 2018-06-12 ENCOUNTER — Telehealth: Payer: Self-pay

## 2018-06-12 ENCOUNTER — Other Ambulatory Visit: Payer: Self-pay

## 2018-06-12 MED ORDER — OMEGA-3-ACID ETHYL ESTERS 1 G PO CAPS: 1.0000 g | ORAL_CAPSULE | Freq: Two times a day (BID) | ORAL | 4 refills | Status: DC

## 2018-06-12 MED ORDER — OMEGA-3-ACID ETHYL ESTERS 1 G PO CAPS: 2.0000 g | ORAL_CAPSULE | Freq: Two times a day (BID) | ORAL | 4 refills | Status: DC

## 2018-06-12 NOTE — Addendum Note (Signed)
Addended by: Hildred AlaminMURRELL, KATRINA Y on: 06/12/2018 01:23 PM   Modules accepted: Orders

## 2018-06-12 NOTE — Telephone Encounter (Signed)
Rn spoke with Shanda BumpsJessica NP that pt will have to take 2 capsule of lovaza twice a day. The medication does not come in 2g dosage. Pt will have to take 1g prescription.

## 2018-06-12 NOTE — Telephone Encounter (Signed)
-----   Message from George HughJessica Vanschaick, NP sent at 06/11/2018  4:56 PM EST ----- Please advise patient that his triglyceride level elevated at 333 with a goal of less than 150.  His bad cholesterol or LDL satisfactory.  At this time, recommended to initiate omega-3 to help decrease triglyceride level and prescription called into pharmacy.  He will take 1 capsule twice daily with meals and recommend to repeat lipid panel in 2 months time.

## 2018-06-12 NOTE — Telephone Encounter (Signed)
RN call pt back to let him know per JEssica NP to take 2 capsule twice a day at 1g. Pt verbalized understanding.

## 2018-06-12 NOTE — Telephone Encounter (Signed)
Rn call patient about his lab work. Rn stated that his triglyceride level elevated at 333 with a goal of less than 150. His bad cholesterol or LDL satisfactory. At this time, recommended to initiate omega-3 to help decrease triglyceride level and prescription called into pharmacy. He will take 1 capsule twice daily with meals and recommend to repeat lipid panel in 2 months time. Pt verbalized understanding

## 2018-06-26 ENCOUNTER — Other Ambulatory Visit: Payer: Self-pay

## 2018-06-26 MED ORDER — FREESTYLE LIBRE 14 DAY SENSOR MISC: 1.0000 | 2 refills | Status: AC

## 2018-07-08 ENCOUNTER — Telehealth: Payer: Self-pay

## 2018-07-08 ENCOUNTER — Other Ambulatory Visit: Payer: Self-pay

## 2018-07-08 ENCOUNTER — Ambulatory Visit (INDEPENDENT_AMBULATORY_CARE_PROVIDER_SITE_OTHER): Payer: Managed Care, Other (non HMO) | Admitting: Physician Assistant

## 2018-07-08 VITALS — Ht 74.0 in | Wt 206.2 lb

## 2018-07-08 DIAGNOSIS — E1122 Type 2 diabetes mellitus with diabetic chronic kidney disease: Secondary | ICD-10-CM

## 2018-07-08 DIAGNOSIS — Z794 Long term (current) use of insulin: Secondary | ICD-10-CM

## 2018-07-08 DIAGNOSIS — E11621 Type 2 diabetes mellitus with foot ulcer: Secondary | ICD-10-CM

## 2018-07-08 DIAGNOSIS — N182 Chronic kidney disease, stage 2 (mild): Secondary | ICD-10-CM

## 2018-07-08 DIAGNOSIS — L97509 Non-pressure chronic ulcer of other part of unspecified foot with unspecified severity: Secondary | ICD-10-CM

## 2018-07-08 DIAGNOSIS — Z89421 Acquired absence of other right toe(s): Secondary | ICD-10-CM

## 2018-07-08 MED ORDER — OMEGA-3-ACID ETHYL ESTERS 1 G PO CAPS: 2.0000 g | ORAL_CAPSULE | Freq: Two times a day (BID) | ORAL | 4 refills | Status: DC

## 2018-07-08 NOTE — Telephone Encounter (Signed)
PA done on cover my meds for Lovaza.

## 2018-07-11 ENCOUNTER — Encounter (INDEPENDENT_AMBULATORY_CARE_PROVIDER_SITE_OTHER): Payer: Self-pay | Admitting: Physician Assistant

## 2018-07-11 NOTE — Progress Notes (Signed)
Office Visit Note   Patient: Curtis Bradford           Date of Birth: 11-04-1960           MRN: [id number] Visit Date: 07/08/2018              Requested by: No referring provider defined for this encounter. PCP: Patient, No Pcp Per  Chief Complaint  Patient presents with  . Right Foot - Follow-up, Pain      HPI: The patient is a 58 year old gentleman who is seen for postoperative follow-up following right great toe amputation on 04/23/2018.  Since his last visit he has developed a lot of callus.  He has been using a felt pad to try to keep pressure off of the forefoot.  He reports that walking is painful at times and he has had intermittent swelling.  He does have to stand on his feet for long periods.  We had written for custom shoes with inserts but he has not been able to obtain these yet due to some insurance issues.  Assessment & Plan: Visit Diagnoses:  1. Status post amputation of toe of right foot (HCC)   2. Type 2 diabetes mellitus with foot ulcer, with long-term current use of insulin (HCC)   3. CKD stage 2 due to type 2 diabetes mellitus (HCC)     Plan: After informed consent the callus over the right first metatarsal head minor grade 1 ulcer was debrided.  The importance of continuing to offload the area was discussed with the patient.  He is going to try to obtain the shoes with inserts.  A felt relieving doughnut was fabricated to try to pressure relieve the first metatarsal head.  He will follow-up in 2 weeks or sooner should he have difficulty in the interim.  Follow-Up Instructions: Return in about 2 weeks (around 07/22/2018).   Ortho Exam  [name] alert, oriented, no adenopathy, well-dressed, normal affect, normal respiratory effort. After informed consent the callus over the right first metatarsal head minor grade 1 ulcer was debrided.  The residual ulcer is 1.4 x 1.0 x 0.2cm.  There is no exposed fascia or bone.  The great toe amputation site has healed well.   There are no signs of infection or cellulitis currently.  Imaging: No results found. No images are attached to the encounter.  Labs: Lab Results  Component Value Date   HGBA1C 7.4 (H) 04/23/2018   HGBA1C 6.5 (A) 01/31/2018   HGBA1C 9.9 (H) 10/31/2017   ESRSEDRATE 31 (H) 02/14/2017   CRP 12.3 (H) 02/14/2017   REPTSTATUS 02/20/2017 FINAL 02/14/2017   REPTSTATUS 02/20/2017 FINAL 02/14/2017   CULT  02/14/2017    NO GROWTH 5 DAYS Performed at [facility], [address] [place], [place] [address]    CULT  02/14/2017    NO GROWTH 5 DAYS Performed at [facility], [address] [place], [place] [address]      Lab Results  Component Value Date   ALBUMIN 4.1 01/31/2018   ALBUMIN 3.7 10/30/2017   ALBUMIN 3.0 (L) 02/15/2017   PREALBUMIN 13.8 (L) 02/15/2017    Body mass index is 26.47 kg/m.  Orders:  No orders of the defined types were placed in this encounter.  No orders of the defined types were placed in this encounter.    Procedures: No procedures performed  Clinical Data: No additional findings.  ROS:  All other systems negative, except as noted in the HPI.  Review of Systems  Objective: Vital Signs: Ht 6\' 2"  (1.88 m)   Wt 206 lb 3.2 oz (93.5 kg)   BMI 26.47 kg/m   Specialty Comments:  No specialty comments available.  PMFS History: Patient Active Problem List   Diagnosis Date Noted  . TIA (transient ischemic attack) 10/30/2017  . Medication monitoring encounter 03/28/2017  . Status post amputation of toe of right foot (HCC) 03/05/2017  . Subacute osteomyelitis, right ankle and foot (HCC)   . Peripheral neuropathy 02/16/2017  . Peripheral artery disease (HCC) 02/16/2017  . 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 Diabetic foot infection (HCC) 02/14/2017  . Type II diabetes mellitus with renal manifestations (HCC)   . HLD (hyperlipidemia)   . Tobacco abuse   . CKD stage 2 due to type 2 diabetes mellitus  (HCC)   . CAD (coronary artery disease)   . Seizure disorder [facility])    Past Medical History:  Diagnosis Date  . CAD (coronary artery disease)   . CKD (chronic kidney disease), stage III (HCC)   . 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 History of DVT (deep vein thrombosis)   . History of kidney stones   . HLD (hyperlipidemia)   . Myocardial infarction (HCC) 2009  . Peripheral vascular disease (HCC)   . Seizure (HCC)    last one 2015  . Stroke [facility]) 2012, 10/2017   2012-speech was effected- has come back.10/2017- numbness of right side no taste right  side. 2012-  . Tobacco abuse     Family History  Problem Relation Age of Onset  . Diabetes Mellitus II Mother   . Diabetes Mellitus II Father     Past Surgical History:  Procedure Laterality Date  . AMPUTATION Right 02/23/2017   Procedure: RIGHT FOOT 5TH RAY AMPUTATION;  Surgeon: [name], MD;  Location: [facility] OR;  Service: Orthopedics;  Laterality: Right;  . AMPUTATION Right 04/23/2018   Procedure: Right Great Toe Amputation;  Surgeon: [name], MD;  Location: [facility] OR;  Service: Orthopedics;  Laterality: Right;  . CORONARY ANGIOPLASTY  03/05/2008   stent  . FOOT SURGERY    . PERIPHERAL ARTERIAL STENT GRAFT Right    leg 2 stents  . STENT PLACEMENT VASCULAR ([facility] HX)     Several in right leg   Social History   Occupational History  . Not on file  Tobacco Use  . Smoking status: Light Tobacco Smoker    Years: 15.00    Types: Cigarettes  . Smokeless tobacco: Never Used  . Tobacco comment: less than a pack a week  Substance and Sexual Activity  . Alcohol use: No    Comment: seldom  . Drug use: No  . Sexual activity: Not on file

## 2018-07-11 NOTE — Telephone Encounter (Signed)
Denial letter receive. Will call [employer] rx.

## 2018-07-14 NOTE — Telephone Encounter (Signed)
Per Shanda Bumps we will not pursue an appeal due to the medication being cheaper buying it over the counter.   Spoke with Mr. Donsbach to let him to know that his Lovaza PA was denied. I advised him that Shanda Bumps stated that he could buy Omega 3 Fish oil over the counter. Patient was appreciative for the phone call. No other questions or concerns at this time.

## 2018-07-21 ENCOUNTER — Ambulatory Visit (INDEPENDENT_AMBULATORY_CARE_PROVIDER_SITE_OTHER): Payer: Managed Care, Other (non HMO)

## 2018-07-21 ENCOUNTER — Telehealth (INDEPENDENT_AMBULATORY_CARE_PROVIDER_SITE_OTHER): Payer: Self-pay | Admitting: Radiology

## 2018-07-21 ENCOUNTER — Ambulatory Visit (INDEPENDENT_AMBULATORY_CARE_PROVIDER_SITE_OTHER): Payer: Managed Care, Other (non HMO) | Admitting: Physician Assistant

## 2018-07-21 DIAGNOSIS — M79671 Pain in right foot: Secondary | ICD-10-CM | POA: Diagnosis not present

## 2018-07-21 DIAGNOSIS — N182 Chronic kidney disease, stage 2 (mild): Secondary | ICD-10-CM

## 2018-07-21 DIAGNOSIS — Z89421 Acquired absence of other right toe(s): Secondary | ICD-10-CM

## 2018-07-21 DIAGNOSIS — L97509 Non-pressure chronic ulcer of other part of unspecified foot with unspecified severity: Secondary | ICD-10-CM

## 2018-07-21 DIAGNOSIS — Z794 Long term (current) use of insulin: Secondary | ICD-10-CM

## 2018-07-21 DIAGNOSIS — E1122 Type 2 diabetes mellitus with diabetic chronic kidney disease: Secondary | ICD-10-CM

## 2018-07-21 DIAGNOSIS — E11621 Type 2 diabetes mellitus with foot ulcer: Secondary | ICD-10-CM

## 2018-07-21 MED ORDER — DOXYCYCLINE HYCLATE 100 MG PO CAPS: 100.0000 mg | ORAL_CAPSULE | Freq: Two times a day (BID) | ORAL | 1 refills | Status: DC

## 2018-07-21 NOTE — Progress Notes (Signed)
Office Visit Note   Patient: Curtis Bradford           Date of Birth: Aug 11, 1960           MRN: [id number] Visit Date: 07/21/2018              Requested by: No referring provider defined for this encounter. PCP: Patient, No Pcp Per  Chief Complaint  Patient presents with  . Right Foot - Follow-up      HPI: The patient is a 58 year old gentleman who is seen for postoperative follow-up following a right great toe amputation on 04/23/2018.  He has developed significant worsening of the right foot edema and severe callus formation as well as some foul-smelling discharge.  He reports that this developed over the past week and has progressively worsened since last Friday.  He has been on antibiotics, doxycycline but this has not altered the course.  He states he is been trying to stay off of the foot is much as possible.  Assessment & Plan: Visit Diagnoses:  1. Pain in right foot   2. Status post amputation of toe of right foot (HCC)   3. Type 2 diabetes mellitus with foot ulcer, with long-term current use of insulin (HCC)   4. CKD stage 2 due to type 2 diabetes mellitus (HCC)     Plan: Plan for OR later this week due to significant worsening with poor response to conservative measures with now ulceration appearing to go to the bone with destructive changes consistent with osteomyelitis.  We discussed right foot transmetatarsal amputation and the patient would like to proceed.  We discussed that he would eventually require a spacer and custom shoe with double upright bracing to facilitate his ambulation.  He is in agreement to proceed with surgery as noted.  He will follow-up in the office following surgery.  Follow-Up Instructions: Return in about 9 days (around 07/30/2018).   Ortho Exam  [name] alert, oriented, no adenopathy, well-dressed, normal affect, normal respiratory effort. The patient had significant worsening of his right foot edema with severe erythema and foul-smelling  discharge from the ulceration over the right first metatarsal head with tracking to the bone and through to the dorsal surface of the foot.  The patient does have palpable dorsalis pedis and posterior tibialis pulses.  Imaging: No results found. No images are attached to the encounter.  Labs: Lab Results  Component Value Date   HGBA1C 7.4 (H) 04/23/2018   HGBA1C 6.5 (A) 01/31/2018   HGBA1C 9.9 (H) 10/31/2017   ESRSEDRATE 31 (H) 02/14/2017   CRP 12.3 (H) 02/14/2017   REPTSTATUS 02/20/2017 FINAL 02/14/2017   REPTSTATUS 02/20/2017 FINAL 02/14/2017   CULT  02/14/2017    NO GROWTH 5 DAYS Performed at [facility], [address] [place], [place] [address]    CULT  02/14/2017    NO GROWTH 5 DAYS Performed at [facility], [address] [place], [place] [address]      Lab Results  Component Value Date   ALBUMIN 4.1 01/31/2018   ALBUMIN 3.7 10/30/2017   ALBUMIN 3.0 (L) 02/15/2017   PREALBUMIN 13.8 (L) 02/15/2017    There is no height or weight on file to calculate BMI.  Orders:  Orders Placed This Encounter  Procedures  . XR Foot Complete Right   Meds ordered this encounter  Medications  . doxycycline (VIBRAMYCIN) 100 MG capsule    Sig: Take 1 capsule (100 mg total) by mouth 2 (  two) times daily.    Dispense:  28 capsule    Refill:  1     Procedures: No procedures performed  Clinical Data: No additional findings.  ROS:  All other systems negative, except as noted in the HPI. Review of Systems  Objective: Vital Signs: There were no vitals taken for this visit.  Specialty Comments:  No specialty comments available.  PMFS History: Patient Active Problem List   Diagnosis Date Noted  . TIA (transient ischemic attack) 10/30/2017  . Medication monitoring encounter 03/28/2017  . Status post amputation of toe of right foot (HCC) 03/05/2017  . Subacute osteomyelitis, right ankle and foot (HCC)   . Peripheral neuropathy 02/16/2017  . Peripheral  artery disease (HCC) 02/16/2017  . 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 Diabetic foot infection (HCC) 02/14/2017  . Type II diabetes mellitus with renal manifestations (HCC)   . HLD (hyperlipidemia)   . Tobacco abuse   . CKD stage 2 due to type 2 diabetes mellitus (HCC)   . CAD (coronary artery disease)   . Seizure disorder [facility])    Past Medical History:  Diagnosis Date  . CAD (coronary artery disease)   . CKD (chronic kidney disease), stage III (HCC)   . 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 History of DVT (deep vein thrombosis)   . History of kidney stones   . HLD (hyperlipidemia)   . Myocardial infarction (HCC) 2009  . Peripheral vascular disease (HCC)   . Seizure (HCC)    last one 2015  . Stroke [facility]) 2012, 10/2017   2012-speech was effected- has come back.10/2017- numbness of right side no taste right  side. 2012-  . Tobacco abuse     Family History  Problem Relation Age of Onset  . Diabetes Mellitus II Mother   . Diabetes Mellitus II Father     Past Surgical History:  Procedure Laterality Date  . AMPUTATION Right 02/23/2017   Procedure: RIGHT FOOT 5TH RAY AMPUTATION;  Surgeon: [name], MD;  Location: [facility] OR;  Service: Orthopedics;  Laterality: Right;  . AMPUTATION Right 04/23/2018   Procedure: Right Great Toe Amputation;  Surgeon: [name], MD;  Location: [facility] OR;  Service: Orthopedics;  Laterality: Right;  . CORONARY ANGIOPLASTY  03/05/2008   stent  . FOOT SURGERY    . PERIPHERAL ARTERIAL STENT GRAFT Right    leg 2 stents  . STENT PLACEMENT VASCULAR ([facility] HX)     Several in right leg   Social History   Occupational History  . Not on file  Tobacco Use  . Smoking status: Light Tobacco Smoker    Years: 15.00    Types: Cigarettes  . Smokeless tobacco: Never Used  . Tobacco comment: less than a pack a week  Substance and Sexual Activity  . Alcohol use: No    Comment: seldom  . Drug use: No  .  Sexual activity: Not on file

## 2018-07-21 NOTE — Telephone Encounter (Signed)
Patient left voicemail on triage line that foot is swollen and draining. He is concerned for infection. Work in appointment made for this afternoon.

## 2018-07-22 ENCOUNTER — Encounter (INDEPENDENT_AMBULATORY_CARE_PROVIDER_SITE_OTHER): Payer: Self-pay | Admitting: Physician Assistant

## 2018-07-22 ENCOUNTER — Ambulatory Visit (INDEPENDENT_AMBULATORY_CARE_PROVIDER_SITE_OTHER): Payer: Self-pay | Admitting: Physician Assistant

## 2018-07-22 NOTE — Progress Notes (Signed)
Anesthesia Chart Review: Curtis Bradford   Case:  [id number] Date/Time:  07/23/18 0715   Procedure:  RIGHT TRANSMETATARSAL AMPUTATION, APPLY WOUND VAC (Right )   Anesthesia type:  Choice   Pre-op diagnosis:  Osteomyelitis/Abscess Right Foot   [facility]  [facility] ROOM 03 / [facility]   Surgeon:  Curtis Mustard, MD      DISCUSSION: [name] a 58 year old male scheduled for the above procedure.  He is s/p right great toe amputation (at MTP joint) for cellulitis/osteomyelitis 04/23/18. Apparently, he now has right foot abscess and the above procedure recommended.  History includes smoking, DM2, PVD (s/p multiple RLE stents; s/p excision of right 2nd, 3rd, and 5th metatarsal heads 01/23/17 [facility]; s/p right 5th toe ray amputation 2017/03/09, s/p right great toe amputation 04/23/18), RLE DVT ~ 6 years ago, CAD/MI (s/p PCI 03/05/08), CVA (2012 [place]; left thalamic infarct 10/30/17), CKD stage III. In 2016 he had a seizure felt due to metformin overdose. He moved from [place] to [place] in 01/2017.  No specific cardiac details other than MI and coronary stent in 2011. Echo in 10/2017 showed normal LVEF with apical hypokinesis. (Unknown if this is an expected or new finding--he did report history of MI and had anterior Q waves on EKG.) I was unable to reach him by phone prior to his 04/23/18 surgery, but he was evaluated by the anesthesiologist and underwent toe amputation later that day. PAT phone RN is still attempting to reach him (no answer) to update history and review instructions for tomorrow's surgery. If she is able to speak with him directly then she will ask him for additional details regarding cardiac history and if he has gotten established with nephrology yet. (Last Cr 04/23/18 was stable at 1.57.) If any new pertinent details obtained prior to end of business day then I will plan to update my note, otherwise anesthesiologist will evaluate on the day of surgery. He is on Plavix and  ASA.   PROVIDERS: Patient, No Pcp Per  Curtis Heady, MD is neurologist. Last visit with Curtis Hugh, NP on 06/10/18. Curtis Littler, MD is endocrinologist. Last visit 05/07/18.  He had a referral to nephrologist Curtis Rhodes, MD on 02/03/18. As of 06/10/18, he had not called to schedule the appointment.   LABS:  Lab Results  Component Value Date   WBC 13.9 (H) 04/23/2018   HGB 13.8 04/23/2018   HCT 42.6 04/23/2018   PLT 296 04/23/2018   GLUCOSE 106 (H) 04/23/2018   ALT 27 01/31/2018   AST 17 01/31/2018   NA 136 04/23/2018   K 3.8 04/23/2018   CL 107 04/23/2018   CREATININE 1.57 (H) 04/23/2018   BUN 23 (H) 04/23/2018   CO2 23 04/23/2018   INR 0.93 10/30/2017   HGBA1C 7.4 (H) 04/23/2018   Lab Results  Component Value Date   CREATININE 1.57 (H) 04/23/2018   CREATININE 1.80 (H) 01/31/2018   CREATININE 1.35 (H) 11/01/2017    IMAGES: CTA neck 10/31/17: IMPRESSION: 1. Atherosclerosis without flow limiting stenosis or ulceration. 2. 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   EKG: 10/30/17: NSR, incomplete right BBB. Voltage criteria for LVH. Anteroseptal infarct (age undetermined). T wave abnormality, consider lateral ischemia. Borderline LAD. T wave abnormality in high lateral leads is more pronounced when compared to Mar 09, 2017 tracing.    CV: Transcranial Doppler with Bubble Study 11/01/17: There is no obvious evidence of high intensity transient signals (HITS), therefore there is no evidence of patent foramen ovale (  PFO). Artifact was seen with valsalva maneuver, possible muscular interference. Negative TCD Bubble study  Echo 10/31/17 (as part of CVA work-up): Study Conclusions - Left ventricle: The cavity size was normal. Systolic function was normal. The estimated ejection fraction was in the range of 55% to 60%. Hypokinesis of the apical myocardium. Doppler parameters are consistent with abnormal left ventricular relaxation (grade 1 diastolic  dysfunction). - Aortic valve: Trileaflet; mildly thickened, mildly calcified leaflets. - Aortic root: The aortic root was mildly dilated. 42 mm. - Mitral valve: Valve area by pressure half-time: 2.44 cm^2.   Past Medical History:  Diagnosis Date  . CAD (coronary artery disease)   . CKD (chronic kidney disease), stage III (HCC)   . 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 History of DVT (deep vein thrombosis)   . History of kidney stones   . HLD (hyperlipidemia)   . Myocardial infarction (HCC) 2009  . Peripheral vascular disease (HCC)   . Seizure (HCC)    last one 2015  . Stroke [facility]) 2012, 10/2017   2012-speech was effected- has come back.10/2017- numbness of right side no taste right  side. 2012-  . Tobacco abuse     Past Surgical History:  Procedure Laterality Date  . AMPUTATION Right 02/23/2017   Procedure: RIGHT FOOT 5TH RAY AMPUTATION;  Surgeon: Curtis Mustard, MD;  Location: [facility] OR;  Service: Orthopedics;  Laterality: Right;  . AMPUTATION Right 04/23/2018   Procedure: Right Great Toe Amputation;  Surgeon: Curtis Mustard, MD;  Location: [facility] OR;  Service: Orthopedics;  Laterality: Right;  . CORONARY ANGIOPLASTY  03/05/2008   stent  . FOOT SURGERY    . PERIPHERAL ARTERIAL STENT GRAFT Right    leg 2 stents  . STENT PLACEMENT VASCULAR ([facility] HX)     Several in right leg    MEDICATIONS: No current facility-administered medications for this encounter.    Marland Kitchen aspirin EC 81 MG tablet  . atorvastatin (LIPITOR) 20 MG tablet  . clopidogrel (PLAVIX) 75 MG tablet  . ezetimibe (ZETIA) 10 MG tablet  . insulin glargine (LANTUS) 100 unit/mL SOPN  . insulin lispro (HUMALOG KWIKPEN) 100 UNIT/ML KiwkPen  . levETIRAcetam (KEPPRA XR) 500 MG 24 hr tablet  . linagliptin (TRADJENTA) 5 MG TABS tablet  . Continuous Blood Gluc Sensor (FREESTYLE LIBRE 14 DAY SENSOR) MISC  . doxycycline (VIBRAMYCIN) 100 MG capsule  . silver sulfADIAZINE (SILVADENE) 1 % cream    Shonna Chock, PA-C Surgical Short Stay/Anesthesiology [facility] Phone [phone] [facility] Phone [phone] 07/22/2018 12:24 PM

## 2018-07-22 NOTE — Anesthesia Preprocedure Evaluation (Addendum)
Anesthesia Evaluation  Patient identified by MRN, date of birth, ID band Patient awake    Reviewed: Allergy & Precautions, NPO status , Patient's Chart, lab work & pertinent test results  Airway Mallampati: II  TM Distance: >3 FB Neck ROM: Full    Dental no notable dental hx.    Pulmonary neg pulmonary ROS, Current Smoker,    Pulmonary exam normal breath sounds clear to auscultation       Cardiovascular + CAD and + Past MI  Normal cardiovascular exam Rhythm:Regular Rate:Normal     Neuro/Psych CVA negative psych ROS   GI/Hepatic negative GI ROS, Neg liver ROS,   Endo/Other  negative endocrine ROSdiabetes, Type 2  Renal/GU CRFRenal disease  negative genitourinary   Musculoskeletal negative musculoskeletal ROS (+)   Abdominal   Peds negative pediatric ROS (+)  Hematology negative hematology ROS (+)   Anesthesia Other Findings   Reproductive/Obstetrics negative OB ROS                            Anesthesia Physical Anesthesia Plan  ASA: III  Anesthesia Plan: MAC and Regional   Post-op Pain Management:    Induction: Intravenous  PONV Risk Score and Plan: 0 and Treatment may vary due to age or medical condition  Airway Management Planned: Simple Face Mask  Additional Equipment:   Intra-op Plan:   Post-operative Plan:   Informed Consent: I have reviewed the patients History and Physical, chart, labs and discussed the procedure including the risks, benefits and alternatives for the proposed anesthesia with the patient or authorized representative who has indicated his/her understanding and acceptance.     Dental advisory given  Plan Discussed with: CRNA  Anesthesia Plan Comments: (PAT note written 07/22/2018 by Myra Gianotti, PA-C. )       Anesthesia Quick Evaluation

## 2018-07-22 NOTE — Progress Notes (Signed)
Left message with the following instructions unable to contact patient: NPO after midnight, arrive at 0530, will need someone to drive you home and stay with you for 24 hours, take Keppra and 1/2 Lantus dose (18 units), take 1/2 sliding scale insulin if CBG > 220

## 2018-07-23 ENCOUNTER — Ambulatory Visit (HOSPITAL_COMMUNITY): Payer: Managed Care, Other (non HMO) | Admitting: Physician Assistant

## 2018-07-23 ENCOUNTER — Encounter (HOSPITAL_COMMUNITY): Payer: Self-pay | Admitting: *Deleted

## 2018-07-23 ENCOUNTER — Encounter (HOSPITAL_COMMUNITY)
Admission: AD | Disposition: A | Discharge status: Home / Self Care | Payer: Self-pay | Source: Home / Self Care | Attending: Orthopedic Surgery

## 2018-07-23 ENCOUNTER — Observation Stay (HOSPITAL_COMMUNITY)
Admission: AD | Admit: 2018-07-23 | Discharge: 2018-07-24 | Disposition: A | Discharge status: Home / Self Care | Payer: Managed Care, Other (non HMO) | Attending: Orthopedic Surgery | Admitting: Orthopedic Surgery

## 2018-07-23 ENCOUNTER — Other Ambulatory Visit: Payer: Self-pay

## 2018-07-23 DIAGNOSIS — I252 Old myocardial infarction: Secondary | ICD-10-CM | POA: Diagnosis not present

## 2018-07-23 DIAGNOSIS — E1169 Type 2 diabetes mellitus with other specified complication: Secondary | ICD-10-CM | POA: Diagnosis not present

## 2018-07-23 DIAGNOSIS — Z87442 Personal history of urinary calculi: Secondary | ICD-10-CM | POA: Insufficient documentation

## 2018-07-23 DIAGNOSIS — Z833 Family history of diabetes mellitus: Secondary | ICD-10-CM | POA: Insufficient documentation

## 2018-07-23 DIAGNOSIS — Z8673 Personal history of transient ischemic attack (TIA), and cerebral infarction without residual deficits: Secondary | ICD-10-CM | POA: Insufficient documentation

## 2018-07-23 DIAGNOSIS — F17210 Nicotine dependence, cigarettes, uncomplicated: Secondary | ICD-10-CM | POA: Insufficient documentation

## 2018-07-23 DIAGNOSIS — I2510 Atherosclerotic heart disease of native coronary artery without angina pectoris: Secondary | ICD-10-CM | POA: Diagnosis not present

## 2018-07-23 DIAGNOSIS — L02611 Cutaneous abscess of right foot: Secondary | ICD-10-CM | POA: Insufficient documentation

## 2018-07-23 DIAGNOSIS — E1151 Type 2 diabetes mellitus with diabetic peripheral angiopathy without gangrene: Secondary | ICD-10-CM | POA: Insufficient documentation

## 2018-07-23 DIAGNOSIS — M869 Osteomyelitis, unspecified: Secondary | ICD-10-CM | POA: Diagnosis present

## 2018-07-23 DIAGNOSIS — Z794 Long term (current) use of insulin: Secondary | ICD-10-CM | POA: Diagnosis not present

## 2018-07-23 DIAGNOSIS — L97516 Non-pressure chronic ulcer of other part of right foot with bone involvement without evidence of necrosis: Secondary | ICD-10-CM | POA: Insufficient documentation

## 2018-07-23 DIAGNOSIS — E1122 Type 2 diabetes mellitus with diabetic chronic kidney disease: Secondary | ICD-10-CM | POA: Insufficient documentation

## 2018-07-23 DIAGNOSIS — Z86718 Personal history of other venous thrombosis and embolism: Secondary | ICD-10-CM | POA: Diagnosis not present

## 2018-07-23 DIAGNOSIS — M868X7 Other osteomyelitis, ankle and foot: Secondary | ICD-10-CM | POA: Insufficient documentation

## 2018-07-23 DIAGNOSIS — E11621 Type 2 diabetes mellitus with foot ulcer: Secondary | ICD-10-CM | POA: Diagnosis not present

## 2018-07-23 DIAGNOSIS — M86271 Subacute osteomyelitis, right ankle and foot: Secondary | ICD-10-CM | POA: Diagnosis not present

## 2018-07-23 DIAGNOSIS — N183 Chronic kidney disease, stage 3 (moderate): Secondary | ICD-10-CM | POA: Insufficient documentation

## 2018-07-23 DIAGNOSIS — Z79899 Other long term (current) drug therapy: Secondary | ICD-10-CM | POA: Diagnosis not present

## 2018-07-23 DIAGNOSIS — E785 Hyperlipidemia, unspecified: Secondary | ICD-10-CM | POA: Diagnosis not present

## 2018-07-23 DIAGNOSIS — Z89431 Acquired absence of right foot: Secondary | ICD-10-CM

## 2018-07-23 DIAGNOSIS — Z7982 Long term (current) use of aspirin: Secondary | ICD-10-CM | POA: Diagnosis not present

## 2018-07-23 HISTORY — PX: TRANSMETATARSAL AMPUTATION: SHX6197

## 2018-07-23 HISTORY — PX: AMPUTATION: SHX166

## 2018-07-23 LAB — CBC
HCT: 41 % (ref 39.0–52.0)
Hemoglobin: 13.2 g/dL (ref 13.0–17.0)
MCH: 30.3 pg (ref 26.0–34.0)
MCHC: 32.2 g/dL (ref 30.0–36.0)
MCV: 94.3 fL (ref 80.0–100.0)
Platelets: 346 10*3/uL (ref 150–400)
RBC: 4.35 MIL/uL (ref 4.22–5.81)
RDW: 12.9 % (ref 11.5–15.5)
WBC: 16.1 10*3/uL — ABNORMAL HIGH (ref 4.0–10.5)
nRBC: 0 % (ref 0.0–0.2)

## 2018-07-23 LAB — BASIC METABOLIC PANEL
Anion gap: 10 (ref 5–15)
BUN: 25 mg/dL — AB (ref 6–20)
CO2: 21 mmol/L — ABNORMAL LOW (ref 22–32)
Calcium: 8.8 mg/dL — ABNORMAL LOW (ref 8.9–10.3)
Chloride: 104 mmol/L (ref 98–111)
Creatinine, Ser: 1.54 mg/dL — ABNORMAL HIGH (ref 0.61–1.24)
GFR calc Af Amer: 58 mL/min — ABNORMAL LOW (ref >60)
GFR calc non Af Amer: 50 mL/min — ABNORMAL LOW (ref >60)
GLUCOSE: 139 mg/dL — AB (ref 70–99)
Potassium: 4 mmol/L (ref 3.5–5.1)
Sodium: 135 mmol/L (ref 135–145)

## 2018-07-23 LAB — HEMOGLOBIN A1C
Hgb A1c MFr Bld: 8.3 % — ABNORMAL HIGH (ref 4.8–5.6)
Mean Plasma Glucose: 191.51 mg/dL

## 2018-07-23 LAB — GLUCOSE, CAPILLARY
GLUCOSE-CAPILLARY: 174 mg/dL — AB (ref 70–99)
Glucose-Capillary: 129 mg/dL — ABNORMAL HIGH (ref 70–99)
Glucose-Capillary: 127 mg/dL — ABNORMAL HIGH (ref 70–99)
Glucose-Capillary: 168 mg/dL — ABNORMAL HIGH (ref 70–99)

## 2018-07-23 SURGERY — AMPUTATION, FOOT, PARTIAL
Anesthesia: Monitor Anesthesia Care | Laterality: Right

## 2018-07-23 MED ORDER — ASPIRIN EC 81 MG PO TBEC
81.0000 mg | DELAYED_RELEASE_TABLET | Freq: Every day | ORAL | Status: DC
  Administered 2018-07-24: 81 mg via ORAL
  Filled 2018-07-23: qty 1

## 2018-07-23 MED ORDER — HYDROMORPHONE HCL 1 MG/ML IJ SOLN: 0.2500 mg | INTRAMUSCULAR | Status: DC | PRN

## 2018-07-23 MED ORDER — PROPOFOL 10 MG/ML IV BOLUS
INTRAVENOUS | Status: AC
  Filled 2018-07-23: qty 20

## 2018-07-23 MED ORDER — CEFAZOLIN SODIUM-DEXTROSE 1-4 GM/50ML-% IV SOLN
1.0000 g | Freq: Four times a day (QID) | INTRAVENOUS | Status: AC
  Administered 2018-07-23 – 2018-07-24 (×3): 1 g via INTRAVENOUS
  Filled 2018-07-23 (×3): qty 50

## 2018-07-23 MED ORDER — METHOCARBAMOL 500 MG PO TABS: 500.0000 mg | ORAL_TABLET | Freq: Four times a day (QID) | ORAL | Status: DC | PRN

## 2018-07-23 MED ORDER — ONDANSETRON HCL 4 MG/2ML IJ SOLN
INTRAMUSCULAR | Status: AC
  Filled 2018-07-23: qty 2

## 2018-07-23 MED ORDER — ROPIVACAINE HCL 5 MG/ML IJ SOLN
INTRAMUSCULAR | Status: DC | PRN
  Administered 2018-07-23: 30 mL via PERINEURAL

## 2018-07-23 MED ORDER — METHOCARBAMOL 1000 MG/10ML IJ SOLN
500.0000 mg | Freq: Four times a day (QID) | INTRAVENOUS | Status: DC | PRN
  Filled 2018-07-23: qty 5

## 2018-07-23 MED ORDER — ONDANSETRON HCL 4 MG PO TABS: 4.0000 mg | ORAL_TABLET | Freq: Four times a day (QID) | ORAL | Status: DC | PRN

## 2018-07-23 MED ORDER — PHENYLEPHRINE 40 MCG/ML (10ML) SYRINGE FOR IV PUSH (FOR BLOOD PRESSURE SUPPORT)
PREFILLED_SYRINGE | INTRAVENOUS | Status: AC
  Filled 2018-07-23: qty 10

## 2018-07-23 MED ORDER — MIDAZOLAM HCL 2 MG/2ML IJ SOLN
INTRAMUSCULAR | Status: AC
Start: 2018-07-23 — End: ?
  Filled 2018-07-23: qty 2

## 2018-07-23 MED ORDER — CEFAZOLIN SODIUM-DEXTROSE 2-4 GM/100ML-% IV SOLN
2.0000 g | INTRAVENOUS | Status: AC
  Administered 2018-07-23: 2 g via INTRAVENOUS
  Filled 2018-07-23: qty 100

## 2018-07-23 MED ORDER — METOCLOPRAMIDE HCL 5 MG PO TABS: 5.0000 mg | ORAL_TABLET | Freq: Three times a day (TID) | ORAL | Status: DC | PRN

## 2018-07-23 MED ORDER — HYDROMORPHONE HCL 1 MG/ML IJ SOLN: 0.5000 mg | INTRAMUSCULAR | Status: DC | PRN

## 2018-07-23 MED ORDER — EZETIMIBE 10 MG PO TABS
10.0000 mg | ORAL_TABLET | Freq: Every day | ORAL | Status: DC
  Administered 2018-07-23 – 2018-07-24 (×2): 10 mg via ORAL
  Filled 2018-07-23 (×2): qty 1

## 2018-07-23 MED ORDER — LEVETIRACETAM ER 500 MG PO TB24
500.0000 mg | ORAL_TABLET | Freq: Every day | ORAL | Status: DC
  Administered 2018-07-23 – 2018-07-24 (×2): 500 mg via ORAL
  Filled 2018-07-23 (×2): qty 1

## 2018-07-23 MED ORDER — BISACODYL 10 MG RE SUPP: 10.0000 mg | Freq: Every day | RECTAL | Status: DC | PRN

## 2018-07-23 MED ORDER — DOCUSATE SODIUM 100 MG PO CAPS
100.0000 mg | ORAL_CAPSULE | Freq: Two times a day (BID) | ORAL | Status: DC
  Administered 2018-07-23 – 2018-07-24 (×2): 100 mg via ORAL
  Filled 2018-07-23 (×3): qty 1

## 2018-07-23 MED ORDER — POLYETHYLENE GLYCOL 3350 17 G PO PACK: 17.0000 g | PACK | Freq: Every day | ORAL | Status: DC | PRN

## 2018-07-23 MED ORDER — PHENYLEPHRINE 40 MCG/ML (10ML) SYRINGE FOR IV PUSH (FOR BLOOD PRESSURE SUPPORT)
PREFILLED_SYRINGE | INTRAVENOUS | Status: DC | PRN
  Administered 2018-07-23: 120 ug via INTRAVENOUS

## 2018-07-23 MED ORDER — CHLORHEXIDINE GLUCONATE 4 % EX LIQD: 60.0000 mL | Freq: Once | CUTANEOUS | Status: DC

## 2018-07-23 MED ORDER — METOCLOPRAMIDE HCL 5 MG/ML IJ SOLN: 5.0000 mg | Freq: Three times a day (TID) | INTRAMUSCULAR | Status: DC | PRN

## 2018-07-23 MED ORDER — OXYCODONE HCL 5 MG PO TABS: 10.0000 mg | ORAL_TABLET | ORAL | Status: DC | PRN

## 2018-07-23 MED ORDER — FENTANYL CITRATE (PF) 100 MCG/2ML IJ SOLN
INTRAMUSCULAR | Status: DC | PRN
  Administered 2018-07-23: 50 ug via INTRAVENOUS

## 2018-07-23 MED ORDER — CLOPIDOGREL BISULFATE 75 MG PO TABS
75.0000 mg | ORAL_TABLET | Freq: Every day | ORAL | Status: DC
  Administered 2018-07-23: 75 mg via ORAL
  Filled 2018-07-23: qty 1

## 2018-07-23 MED ORDER — OXYCODONE HCL 5 MG/5ML PO SOLN: 5.0000 mg | Freq: Once | ORAL | Status: DC | PRN

## 2018-07-23 MED ORDER — SODIUM CHLORIDE 0.9 % IV SOLN: INTRAVENOUS | Status: DC

## 2018-07-23 MED ORDER — ONDANSETRON HCL 4 MG/2ML IJ SOLN: 4.0000 mg | Freq: Four times a day (QID) | INTRAMUSCULAR | Status: DC | PRN

## 2018-07-23 MED ORDER — INSULIN GLARGINE 100 UNIT/ML ~~LOC~~ SOLN
30.0000 [IU] | Freq: Every day | SUBCUTANEOUS | Status: DC
  Administered 2018-07-23: 30 [IU] via SUBCUTANEOUS
  Filled 2018-07-23 (×2): qty 0.3

## 2018-07-23 MED ORDER — OXYCODONE HCL 5 MG PO TABS: 5.0000 mg | ORAL_TABLET | Freq: Once | ORAL | Status: DC | PRN

## 2018-07-23 MED ORDER — MAGNESIUM CITRATE PO SOLN: 1.0000 | Freq: Once | ORAL | Status: DC | PRN

## 2018-07-23 MED ORDER — PROPOFOL 10 MG/ML IV BOLUS
INTRAVENOUS | Status: DC | PRN
  Administered 2018-07-23: 20 mg via INTRAVENOUS

## 2018-07-23 MED ORDER — INSULIN ASPART 100 UNIT/ML ~~LOC~~ SOLN
4.0000 [IU] | Freq: Three times a day (TID) | SUBCUTANEOUS | Status: DC
  Administered 2018-07-23 – 2018-07-24 (×4): 4 [IU] via SUBCUTANEOUS

## 2018-07-23 MED ORDER — ATORVASTATIN CALCIUM 10 MG PO TABS
20.0000 mg | ORAL_TABLET | Freq: Every day | ORAL | Status: DC
  Administered 2018-07-23 – 2018-07-24 (×2): 20 mg via ORAL
  Filled 2018-07-23 (×2): qty 2

## 2018-07-23 MED ORDER — MIDAZOLAM HCL 5 MG/5ML IJ SOLN
INTRAMUSCULAR | Status: DC | PRN
  Administered 2018-07-23: 2 mg via INTRAVENOUS

## 2018-07-23 MED ORDER — PROPOFOL 500 MG/50ML IV EMUL
INTRAVENOUS | Status: DC | PRN
  Administered 2018-07-23: 50 ug/kg/min via INTRAVENOUS

## 2018-07-23 MED ORDER — ONDANSETRON HCL 4 MG/2ML IJ SOLN
INTRAMUSCULAR | Status: DC | PRN
  Administered 2018-07-23: 4 mg via INTRAVENOUS

## 2018-07-23 MED ORDER — INSULIN ASPART 100 UNIT/ML ~~LOC~~ SOLN
0.0000 [IU] | Freq: Three times a day (TID) | SUBCUTANEOUS | Status: DC
  Administered 2018-07-23: 3 [IU] via SUBCUTANEOUS
  Administered 2018-07-24 (×2): 2 [IU] via SUBCUTANEOUS

## 2018-07-23 MED ORDER — LACTATED RINGERS IV SOLN
INTRAVENOUS | Status: DC | PRN
  Administered 2018-07-23: 07:00:00 via INTRAVENOUS

## 2018-07-23 MED ORDER — OXYCODONE HCL 5 MG PO TABS
5.0000 mg | ORAL_TABLET | ORAL | Status: DC | PRN
  Administered 2018-07-23 – 2018-07-24 (×3): 10 mg via ORAL
  Filled 2018-07-23 (×3): qty 2

## 2018-07-23 MED ORDER — LINAGLIPTIN 5 MG PO TABS
5.0000 mg | ORAL_TABLET | Freq: Every day | ORAL | Status: DC
  Administered 2018-07-23 – 2018-07-24 (×2): 5 mg via ORAL
  Filled 2018-07-23 (×2): qty 1

## 2018-07-23 MED ORDER — FENTANYL CITRATE (PF) 250 MCG/5ML IJ SOLN
INTRAMUSCULAR | Status: AC
  Filled 2018-07-23: qty 5

## 2018-07-23 MED ORDER — 0.9 % SODIUM CHLORIDE (POUR BTL) OPTIME
TOPICAL | Status: DC | PRN
  Administered 2018-07-23: 1000 mL

## 2018-07-23 MED ORDER — ACETAMINOPHEN 325 MG PO TABS: 325.0000 mg | ORAL_TABLET | Freq: Four times a day (QID) | ORAL | Status: DC | PRN

## 2018-07-23 MED ORDER — PROMETHAZINE HCL 25 MG/ML IJ SOLN: 6.2500 mg | INTRAMUSCULAR | Status: DC | PRN

## 2018-07-23 SURGICAL SUPPLY — 37 items
APL SKNCLS STERI-STRIP NONHPOA (GAUZE/BANDAGES/DRESSINGS) ×1
BENZOIN TINCTURE PRP APPL 2/3 (GAUZE/BANDAGES/DRESSINGS) ×2 IMPLANT
BLADE SAW SGTL HD 18.5X60.5X1. (BLADE) ×2 IMPLANT
BLADE SURG 21 STRL SS (BLADE) ×2 IMPLANT
BNDG COHESIVE 4X5 TAN STRL (GAUZE/BANDAGES/DRESSINGS) IMPLANT
BNDG GAUZE ELAST 4 BULKY (GAUZE/BANDAGES/DRESSINGS) IMPLANT
CANISTER WOUND CARE 500ML ATS (WOUND CARE) ×1 IMPLANT
COVER SURGICAL LIGHT HANDLE (MISCELLANEOUS) ×2 IMPLANT
COVER WAND RF STERILE (DRAPES) ×2 IMPLANT
DRAPE INCISE IOBAN 66X45 STRL (DRAPES) ×2 IMPLANT
DRAPE U-SHAPE 47X51 STRL (DRAPES) ×2 IMPLANT
DRSG ADAPTIC 3X8 NADH LF (GAUZE/BANDAGES/DRESSINGS) IMPLANT
DRSG PAD ABDOMINAL 8X10 ST (GAUZE/BANDAGES/DRESSINGS) IMPLANT
DURAPREP 26ML APPLICATOR (WOUND CARE) ×2 IMPLANT
ELECT REM PT RETURN 9FT ADLT (ELECTROSURGICAL) ×2
ELECTRODE REM PT RTRN 9FT ADLT (ELECTROSURGICAL) ×1 IMPLANT
GAUZE SPONGE 4X4 12PLY STRL (GAUZE/BANDAGES/DRESSINGS) IMPLANT
GLOVE BIOGEL PI IND STRL 9 (GLOVE) ×1 IMPLANT
GLOVE BIOGEL PI INDICATOR 9 (GLOVE) ×1
GLOVE SURG ORTHO 9.0 STRL STRW (GLOVE) ×2 IMPLANT
GOWN STRL REUS W/ TWL XL LVL3 (GOWN DISPOSABLE) ×3 IMPLANT
GOWN STRL REUS W/TWL XL LVL3 (GOWN DISPOSABLE) ×6
KIT BASIN OR (CUSTOM PROCEDURE TRAY) ×2 IMPLANT
KIT DRSG PREVENA PLUS 7DAY 125 (MISCELLANEOUS) ×1 IMPLANT
KIT PREVENA INCISION MGT 13 (CANNISTER) ×1 IMPLANT
KIT TURNOVER KIT B (KITS) ×2 IMPLANT
NS IRRIG 1000ML POUR BTL (IV SOLUTION) ×2 IMPLANT
PACK ORTHO EXTREMITY (CUSTOM PROCEDURE TRAY) ×2 IMPLANT
PAD ARMBOARD 7.5X6 YLW CONV (MISCELLANEOUS) ×4 IMPLANT
SPONGE LAP 18X18 X RAY DECT (DISPOSABLE) IMPLANT
SUT ETHILON 2 0 PSLX (SUTURE) ×4 IMPLANT
SUT VIC AB 2-0 CTB1 (SUTURE) IMPLANT
TOWEL OR 17X24 6PK STRL BLUE (TOWEL DISPOSABLE) ×2 IMPLANT
TOWEL OR 17X26 10 PK STRL BLUE (TOWEL DISPOSABLE) ×2 IMPLANT
TUBE CONNECTING 12X1/4 (SUCTIONS) ×2 IMPLANT
WATER STERILE IRR 1000ML POUR (IV SOLUTION) ×2 IMPLANT
YANKAUER SUCT BULB TIP NO VENT (SUCTIONS) ×2 IMPLANT

## 2018-07-23 NOTE — Anesthesia Postprocedure Evaluation (Signed)
Anesthesia Post Note  Patient: Curtis Bradford  Procedure(s) Performed: RIGHT TRANSMETATARSAL AMPUTATION, APPLY WOUND VAC (Right )     Patient location during evaluation: PACU Anesthesia Type: Regional Level of consciousness: awake and alert Pain management: pain level controlled Vital Signs Assessment: post-procedure vital signs reviewed and stable Respiratory status: spontaneous breathing, nonlabored ventilation and respiratory function stable Cardiovascular status: stable and blood pressure returned to baseline Postop Assessment: no apparent nausea or vomiting Anesthetic complications: no    Last Vitals:  Vitals:   07/23/18 0847 07/23/18 0903  BP: 123/79 138/89  Pulse: 73 80  Resp: 18 19  Temp: 36.4 C   SpO2: 96% 96%    Last Pain:  Vitals:   07/23/18 0847  TempSrc:   PainSc: Asleep                 Lowella Curb

## 2018-07-23 NOTE — Anesthesia Procedure Notes (Signed)
Procedure Name: MAC Date/Time: 07/23/2018 7:27 AM Performed by: Leonor Liv, CRNA Pre-anesthesia Checklist: Patient identified, Emergency Drugs available, Suction available, Patient being monitored and Timeout performed Patient Re-evaluated:Patient Re-evaluated prior to induction Oxygen Delivery Method: Simple face mask Placement Confirmation: positive ETCO2 Dental Injury: Teeth and Oropharynx as per pre-operative assessment

## 2018-07-23 NOTE — Plan of Care (Signed)
  Problem: Pain Managment: Goal: General experience of comfort will improve Outcome: Progressing   Problem: Safety: Goal: Ability to remain free from injury will improve Outcome: Progressing   

## 2018-07-23 NOTE — Transfer of Care (Signed)
Immediate Anesthesia Transfer of Care Note  Patient: Curtis Bradford  Procedure(s) Performed: RIGHT TRANSMETATARSAL AMPUTATION, APPLY WOUND VAC (Right )  Patient Location: PACU  Anesthesia Type:MAC  Level of Consciousness: awake, alert  and oriented  Airway & Oxygen Therapy: Patient Spontanous Breathing  Post-op Assessment: Report given to RN, Post -op Vital signs reviewed and stable and Patient moving all extremities  Post vital signs: Reviewed and stable  Last Vitals:  Vitals Value Taken Time  BP 140/81 07/23/2018  8:17 AM  Temp    Pulse 78 07/23/2018  8:21 AM  Resp 19 07/23/2018  8:21 AM  SpO2 99 % 07/23/2018  8:21 AM  Vitals shown include unvalidated device data.  Last Pain:  Vitals:   07/23/18 6720  TempSrc: Oral  PainSc:       Patients Stated Pain Goal: 3 (94/70/96 2836)  Complications: No apparent anesthesia complications

## 2018-07-23 NOTE — Progress Notes (Signed)
Physical Therapy Evaluation Patient Details Name: Curtis Bradford MRN: [id number] DOB: 01-06-1961 Today's Date: 07/23/2018   History of Present Illness  [name] 58 y/o male s/p R transmetatarsal amputation secondary to osteomyelitis. 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   Clinical Impression  Patient admitted to hospital secondary to problems above with deficits below. Patient required min guard for transfer with RW. Session limited due to patient unable to maintain weight bearing precautions despite frequent cueing. Given inability to maintain appropriate weight bearing precautions patient may require SNF level therapy at d/c. However, if patient able to maintain precautions and progress mobility will likely be able to progress to home with HHPT. Will update recommendations appropriately based on patient progression. Patient will benefit from acute physical therapy to maximize independence and safety with functional mobility.     Follow Up Recommendations Other (comment)(SNF vs home health PT)    Equipment Recommendations  Rolling walker with 5" wheels;3in1 (PT);Wheelchair (measurements PT);Wheelchair cushion (measurements PT)    Recommendations for Other Services OT consult     Precautions / Restrictions Precautions Precautions: Fall Restrictions Weight Bearing Restrictions: Yes RLE Weight Bearing: Non weight bearing      Mobility  Bed Mobility Overal bed mobility: Needs Assistance Bed Mobility: Supine to Sit     Supine to sit: Supervision     General bed mobility comments: Patient required supervision for bed mobility for safety  Transfers Overall transfer level: Needs assistance Equipment used: Rolling walker (2 wheeled) Transfers: Sit to/from [employer] Sit to Stand: Min guard Stand pivot transfers: Min guard       General  transfer comment: Patient required min guard for transfers to chair for safety. Continuous verbal and tactile cues to maintain weight bearing precautions. Verbal cues to push up with hands and LLE to stand. Verbal cues for sequencing with RW. Patient unable to maintain weight bearing prcautions during transfers. Further mobility deferred.  Ambulation/Gait                Stairs            Wheelchair Mobility    Modified Rankin (Stroke Patients Only)       Balance Overall balance assessment: Needs assistance Sitting-balance support: Feet supported;No upper extremity supported Sitting balance-Leahy Scale: Good     Standing balance support: Bilateral upper extremity supported Standing balance-Leahy Scale: Poor Standing balance comment: reliant on BUE support and use of RW to maintain standing balance                             Pertinent Vitals/Pain Pain Assessment: 0-10 Pain Score: 5  Pain Location: R foot Pain Descriptors / Indicators: Aching;Operative site guarding;Discomfort Pain Intervention(s): Limited activity within patient's tolerance;Monitored during session    Home Living Family/patient expects to be discharged to:: Private residence Living Arrangements: Alone Available Help at Discharge: Friend(s);Available PRN/intermittently Type of Home: Apartment Home Access: Level entry     Home Layout: One level Home Equipment: None Additional Comments: Patient reports friends can come and stay with him    Prior Function Level of Independence: Independent               Hand Dominance        Extremity/Trunk Assessment   Upper Extremity Assessment Upper Extremity Assessment: Defer to OT evaluation  Lower Extremity Assessment Lower Extremity Assessment: Generalized weakness;RLE deficits/detail RLE Deficits / Details: RLE deficits consistent with post op pain and weakness  RLE Sensation: decreased light touch    Cervical / Trunk  Assessment Cervical / Trunk Assessment: Normal  Communication   Communication: No difficulties  Cognition Arousal/Alertness: Awake/alert Behavior During Therapy: WFL for tasks assessed/performed Overall Cognitive Status: Within Functional Limits for tasks assessed                                        General Comments      Exercises     Assessment/Plan    PT Assessment Patient needs continued PT services  PT Problem List Decreased strength;Decreased activity tolerance;Decreased range of motion;Decreased balance;Decreased mobility;Decreased knowledge of use of DME;Decreased knowledge of precautions;Pain       PT Treatment Interventions DME instruction;Gait training;Functional mobility training;Therapeutic activities;Therapeutic exercise;Balance training;Patient/family education;Wheelchair mobility training    PT Goals (Current goals can be found in the Care Plan section)  Acute Rehab PT Goals Patient Stated Goal: go home PT Goal Formulation: With patient Time For Goal Achievement: 08/06/18 Potential to Achieve Goals: Fair    Frequency Min 3X/week   Barriers to discharge Decreased caregiver support      Co-evaluation               AM-PAC PT "6 Clicks" Mobility  Outcome Measure Help needed turning from your back to your side while in a flat bed without using bedrails?: None Help needed moving from lying on your back to sitting on the side of a flat bed without using bedrails?: None Help needed moving to and from a bed to a chair (including a wheelchair)?: A Little Help needed standing up from a chair using your arms (e.g., wheelchair or bedside chair)?: A Little Help needed to walk in hospital room?: A Little Help needed climbing 3-5 steps with a railing? : A Lot 6 Click Score: 19    End of Session Equipment Utilized During Treatment: Gait belt Activity Tolerance: Other (comment)(limited due to patient unable to maintain precautions ) Patient  left: in chair;with call bell/phone within reach;with chair alarm set Nurse Communication: Mobility status;Precautions PT Visit Diagnosis: Other abnormalities of gait and mobility (R26.89);Difficulty in walking, not elsewhere classified (R26.2);Pain Pain - Right/Left: Right Pain - part of body: Ankle and joints of foot    Time: 4098-1191 PT Time Calculation (min) (ACUTE ONLY): 24 min   Charges:   PT Evaluation $PT Eval Moderate Complexity: 1 Mod PT Treatments $Gait Training: 8-22 mins        Vanessa Ralphs, SPT  Vanessa Ralphs 07/23/2018, 6:01 PM

## 2018-07-23 NOTE — Plan of Care (Signed)

## 2018-07-23 NOTE — H&P (Signed)
Curtis Bradford is an 58 y.o. male.   Chief Complaint: Right foot pain HPI: The patient is a 58 year old gentleman who is seen for postoperative follow-up following a right great toe amputation on 04/23/2018.  He has developed significant worsening of the right foot edema and severe callus formation as well as some foul-smelling discharge.  He reports that this developed over the past week and has progressively worsened since last Friday.  He has been on antibiotics, doxycycline but this has not altered the course.  He states he is been trying to stay off of the foot is much as possible.   Past Medical History:  Diagnosis Date  . CAD (coronary artery disease)   . CKD (chronic kidney disease), stage III (HCC)   . 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 History of DVT (deep vein thrombosis)   . History of kidney stones   . HLD (hyperlipidemia)   . Myocardial infarction (HCC) 2009  . Peripheral vascular disease (HCC)   . Seizure (HCC)    last one 2015  . Stroke [facility]) 2012, 10/2017   2012-speech was effected- has come back.10/2017- numbness of right side no taste right  side. 2012-  . Tobacco abuse     Past Surgical History:  Procedure Laterality Date  . AMPUTATION Right 02/23/2017   Procedure: RIGHT FOOT 5TH RAY AMPUTATION;  Surgeon: [name], MD;  Location: [facility] OR;  Service: Orthopedics;  Laterality: Right;  . AMPUTATION Right 04/23/2018   Procedure: Right Great Toe Amputation;  Surgeon: [name], MD;  Location: [facility] OR;  Service: Orthopedics;  Laterality: Right;  . CORONARY ANGIOPLASTY  03/05/2008   stent  . FOOT SURGERY    . PERIPHERAL ARTERIAL STENT GRAFT Right    leg 2 stents  . STENT PLACEMENT VASCULAR ([facility] HX)     Several in right leg  . TONSILLECTOMY      Family History  Problem Relation Age of Onset  . Diabetes Mellitus II Mother   . Diabetes Mellitus II Father    Social History:  reports that he has been smoking cigarettes. He has smoked for  the past 15.00 years. He has never used smokeless tobacco. He reports that he does not drink alcohol or use drugs.  Allergies: No Known Allergies  Medications Prior to Admission  Medication Sig Dispense Refill  . aspirin EC 81 MG tablet Take 81 mg by mouth daily.     Marland Kitchen atorvastatin (LIPITOR) 20 MG tablet Take 1 tablet (20 mg total) by mouth daily. 90 tablet 3  . clopidogrel (PLAVIX) 75 MG tablet Take 1 tablet (75 mg total) by mouth at bedtime. (Patient taking differently: Take 75 mg by mouth daily. ) 90 tablet 4  . doxycycline (VIBRAMYCIN) 100 MG capsule Take 1 capsule (100 mg total) by mouth 2 (two) times daily. 28 capsule 1  . ezetimibe (ZETIA) 10 MG tablet Take 1 tablet (10 mg total) by mouth daily. 90 tablet 3  . insulin glargine (LANTUS) 100 unit/mL SOPN Inject 0.37 mLs (37 Units total) into the skin at bedtime. (Patient taking differently: Inject 37 Units into the skin every morning. Pt's bedtime is morning) 15 mL 3  . insulin lispro (HUMALOG KWIKPEN) 100 UNIT/ML KiwkPen Inject 0-0.15 mLs (0-15 Units total) into the skin 2 (two) times daily. Typically takes 4-6 units per meal. Per sliding scale (Patient taking differently: Inject 4-12 Units into the skin 3 (three) times daily as needed (high blood sugar). )  15 mL 11  . levETIRAcetam (KEPPRA XR) 500 MG 24 hr tablet Take 1 tablet (500 mg total) by mouth daily. 90 tablet 3  . linagliptin (TRADJENTA) 5 MG TABS tablet Take 1 tablet (5 mg total) by mouth daily. 30 tablet 3  . silver sulfADIAZINE (SILVADENE) 1 % cream Apply 1 application topically daily. 50 g 0  . Continuous Blood Gluc Sensor (FREESTYLE LIBRE 14 DAY SENSOR) MISC Place 1 patch onto the skin every 14 (fourteen) days. 6 each 2    Results for orders placed or performed during the hospital encounter of 07/23/18 (from the past 48 hour(s))  Glucose, capillary     Status: Abnormal   Collection Time: 07/23/18  6:08 AM  Result Value Ref Range   Glucose-Capillary 129 (H) 70 - 99 mg/dL    Xr Foot Complete Right  Result Date: 07/22/2018 Radiographs of the right foot show severe destructive changes of metatarsal heads possibly consistent with osteomyelitis.   Review of Systems  All other systems reviewed and are negative.   Blood pressure (!) 152/84, pulse 88, temperature 98.5 F (36.9 C), temperature source Oral, resp. rate 20, height 6\' 2"  (1.88 m), weight 90.3 kg, SpO2 97 %. Physical Exam  [name] alert, oriented, no adenopathy, well-dressed, normal affect, normal respiratory effort. The patient had significant worsening of his right foot edema with severe erythema and foul-smelling discharge from the ulceration over the right first metatarsal head with tracking to the bone and through to the dorsal surface of the foot.  The patient does have palpable dorsalis pedis and posterior tibialis pulses.  Assessment/Plan 1. Pain in right foot   2. Status post amputation of toe of right foot (HCC)   3. Type 2 diabetes mellitus with foot ulcer, with long-term current use of insulin (HCC)   4. CKD stage 2 due to type 2 diabetes mellitus (HCC)     Plan: Plan for OR later this week due to significant worsening with poor response to conservative measures with now ulceration appearing to go to the bone with destructive changes consistent with osteomyelitis.  We discussed right foot transmetatarsal amputation and the patient would like to proceed.  We discussed that he would eventually require a spacer and custom shoe with double upright bracing to facilitate his ambulation.  He is in agreement to proceed with surgery as noted.  He will follow-up in the office following surgery.    Nadara MustardMarcus V Paula Zietz, MD 07/23/2018, 6:49 AM

## 2018-07-23 NOTE — Op Note (Signed)
07/23/2018  8:16 AM  PATIENT:  Curtis Bradford    PRE-OPERATIVE DIAGNOSIS:  Osteomyelitis/Abscess Right Foot  POST-OPERATIVE DIAGNOSIS:  Same  PROCEDURE:  RIGHT TRANSMETATARSAL AMPUTATION, APPLY WOUND VAC  SURGEON:  Newt Minion, MD  PHYSICIAN ASSISTANT: April Green ANESTHESIA:   General  PREOPERATIVE INDICATIONS:  Bryor Rami is a  58 y.o. male with a diagnosis of Osteomyelitis/Abscess Right Foot who failed conservative measures and elected for surgical management.    The risks benefits and alternatives were discussed with the patient preoperatively including but not limited to the risks of infection, bleeding, nerve injury, cardiopulmonary complications, the need for revision surgery, among others, and the patient was willing to proceed.  OPERATIVE IMPLANTS: None  _0 @  OPERATIVE FINDINGS: No abscess at the level of amputation  OPERATIVE PROCEDURE: Patient was brought the operating room and underwent a regional anesthetic and then a MAC anesthetic.  After adequate levels anesthesia obtained patient's right lower extremity was prepped using DuraPrep draped into a sterile field a timeout was called.  A fishmouth incision was made proximal to the ulcerative tissue this was carried down through healthy viable tissue.  Electrocautery was used for hemostasis.  An oscillating saw was used to perform a transmetatarsal amputation.  Further electrocautery was used for hemostasis.  The wound was irrigated with normal saline.  The incision was closed using 2-0 nylon.  A Praveena wound VAC was applied this had a good suction fit patient was taken the PACU in stable condition.   DISCHARGE PLANNING:  Antibiotic duration: 24 hours  Weightbearing: Nonweightbearing on the right  Pain medication: Opioid pathway ordered  Dressing care/ Wound VAC: Continue wound VAC for 1 week  Ambulatory devices: Walker or crutches  Discharge to: Home when safe with ambulation.  Follow-up: In the office  1 week post operative.

## 2018-07-23 NOTE — Anesthesia Procedure Notes (Signed)
Anesthesia Regional Block: Popliteal block   Pre-Anesthetic Checklist: ,, timeout performed, Correct Patient, Correct Site, Correct Laterality, Correct Procedure, Correct Position, site marked, Risks and benefits discussed,  Surgical consent,  Pre-op evaluation,  At surgeon's request and post-op pain management  Laterality: Right  Prep: chloraprep       Needles:  Injection technique: Single-shot  Needle Type: Stimiplex     Needle Length: 9cm  Needle Gauge: 21     Additional Needles:   Procedures:,,,, ultrasound used (permanent image in chart),,,,  Narrative:  Start time: 07/23/2018 7:14 AM End time: 07/23/2018 7:19 AM Injection made incrementally with aspirations every 5 mL.  Performed by: Personally  Anesthesiologist: Lowella Curb, MD

## 2018-07-24 ENCOUNTER — Encounter (HOSPITAL_COMMUNITY): Payer: Self-pay | Admitting: General Practice

## 2018-07-24 ENCOUNTER — Ambulatory Visit (INDEPENDENT_AMBULATORY_CARE_PROVIDER_SITE_OTHER): Payer: Managed Care, Other (non HMO) | Admitting: Orthopedic Surgery

## 2018-07-24 DIAGNOSIS — E1169 Type 2 diabetes mellitus with other specified complication: Secondary | ICD-10-CM | POA: Diagnosis not present

## 2018-07-24 LAB — GLUCOSE, CAPILLARY
GLUCOSE-CAPILLARY: 141 mg/dL — AB (ref 70–99)
Glucose-Capillary: 110 mg/dL — ABNORMAL HIGH (ref 70–99)
Glucose-Capillary: 142 mg/dL — ABNORMAL HIGH (ref 70–99)

## 2018-07-24 MED ORDER — OXYCODONE-ACETAMINOPHEN 5-325 MG PO TABS: 1.0000 | ORAL_TABLET | ORAL | 0 refills | Status: DC | PRN

## 2018-07-24 NOTE — Discharge Summary (Signed)
Discharge Diagnoses:  Active Problems:   Status post transmetatarsal amputation of foot, right (HCC)   Osteomyelitis (HCC)   Surgeries: Procedure(s): RIGHT TRANSMETATARSAL AMPUTATION, APPLY WOUND VAC on 07/23/2018    Consultants:   Discharged Condition: Improved  Hospital Course: Curtis Bradford is an 58 y.o. male who was admitted 07/23/2018 with a chief complaint of osteomyelitis right foot, with a final diagnosis of Osteomyelitis/Abscess Right Foot.  Patient was brought to the operating room on 07/23/2018 and underwent Procedure(s): RIGHT TRANSMETATARSAL AMPUTATION, APPLY WOUND VAC.    Patient was given perioperative antibiotics:  Anti-infectives (From admission, onward)   Start     Dose/Rate Route Frequency Ordered Stop   07/23/18 1330  ceFAZolin (ANCEF) IVPB 1 g/50 mL premix     1 g 100 mL/hr over 30 Minutes Intravenous Every 6 hours 07/23/18 1240 07/24/18 0234   07/23/18 0600  ceFAZolin (ANCEF) IVPB 2g/100 mL premix     2 g 200 mL/hr over 30 Minutes Intravenous On call to O.R. 07/23/18 1610 07/23/18 0727    .  Patient was given sequential compression devices, early ambulation, and aspirin for DVT prophylaxis.  Recent vital signs:  Patient Vitals for the past 24 hrs:  BP Temp Temp src Pulse Resp SpO2  07/24/18 0334 124/67 98.5 F (36.9 C) Oral 86 20 97 %  07/23/18 2341 134/73 99.1 F (37.3 C) Oral 86 20 98 %  07/23/18 2006 120/72 98.7 F (37.1 C) Oral 81 - 97 %  07/23/18 1306 (!) 157/79 99.8 F (37.7 C) Oral 77 18 98 %  07/23/18 1200 122/76 97.7 F (36.5 C) - 78 18 93 %  07/23/18 1103 123/70 - - 77 17 93 %  07/23/18 1003 137/81 - - 77 17 95 %  07/23/18 0903 138/89 - - 80 19 96 %  07/23/18 0847 123/79 97.6 F (36.4 C) - 73 18 96 %  07/23/18 0832 (!) 145/83 - - 73 16 99 %  07/23/18 0817 140/81 (!) 97.5 F (36.4 C) - 79 18 98 %  .  Recent laboratory studies: No results found.  Discharge Medications:   Allergies as of 07/24/2018   No Known Allergies      Medication List    STOP taking these medications   silver sulfADIAZINE 1 % cream Commonly known as:  SILVADENE     TAKE these medications   aspirin EC 81 MG tablet Take 81 mg by mouth daily.   atorvastatin 20 MG tablet Commonly known as:  LIPITOR Take 1 tablet (20 mg total) by mouth daily.   clopidogrel 75 MG tablet Commonly known as:  PLAVIX Take 1 tablet (75 mg total) by mouth at bedtime. What changed:  when to take this   doxycycline 100 MG capsule Commonly known as:  VIBRAMYCIN Take 1 capsule (100 mg total) by mouth 2 (two) times daily.   ezetimibe 10 MG tablet Commonly known as:  ZETIA Take 1 tablet (10 mg total) by mouth daily.   FREESTYLE LIBRE 14 DAY SENSOR Misc Place 1 patch onto the skin every 14 (fourteen) days.   insulin glargine 100 unit/mL Sopn Commonly known as:  LANTUS Inject 0.37 mLs (37 Units total) into the skin at bedtime. What changed:    when to take this  additional instructions   insulin lispro 100 UNIT/ML KiwkPen Commonly known as:  HUMALOG KWIKPEN Inject 0-0.15 mLs (0-15 Units total) into the skin 2 (two) times daily. Typically takes 4-6 units per meal. Per sliding scale What changed:  how much to take  when to take this  reasons to take this  additional instructions   levETIRAcetam 500 MG 24 hr tablet Commonly known as:  KEPPRA XR Take 1 tablet (500 mg total) by mouth daily.   linagliptin 5 MG Tabs tablet Commonly known as:  TRADJENTA Take 1 tablet (5 mg total) by mouth daily.   oxyCODONE-acetaminophen 5-325 MG tablet Commonly known as:  PERCOCET/ROXICET Take 1 tablet by mouth every 4 (four) hours as needed.            Discharge Care Instructions  (From admission, onward)         Start     Ordered   07/24/18 0000  Touch down weight bearing    Question Answer Comment  Laterality right   Extremity Lower      07/24/18 0705          Diagnostic Studies: Xr Foot Complete Right  Result Date:  07/22/2018 Radiographs of the right foot show severe destructive changes of metatarsal heads possibly consistent with osteomyelitis.   Patient benefited maximally from their hospital stay and there were no complications.     Disposition: Discharge disposition: 01-Home or Self Care      Discharge Instructions    Call MD / Call 911   Complete by:  As directed    If you experience chest pain or shortness of breath, CALL 911 and be transported to the hospital emergency room.  If you develope a fever above 101 F, pus (white drainage) or increased drainage or redness at the wound, or calf pain, call your surgeon's office.   Constipation Prevention   Complete by:  As directed    Drink plenty of fluids.  Prune juice may be helpful.  You may use a stool softener, such as Colace (over the counter) 100 mg twice a day.  Use MiraLax (over the counter) for constipation as needed.   Diet - low sodium heart healthy   Complete by:  As directed    Increase activity slowly as tolerated   Complete by:  As directed    Negative Pressure Wound Therapy - Incisional   Complete by:  As directed    Attached dressing to the portable Praveena wound VAC pump.  Have patient plug the unit in to keep it charged.   Post-op shoe   Complete by:  As directed    Touch down weight bearing   Complete by:  As directed    Laterality:  right   Extremity:  Lower     Follow-up Information    [name], MD In 1 week.   Specialty:  Orthopedic Surgery Contact information: [address] [place] [address] [phone]            Signed: [name] 07/24/2018, 7:05 AM

## 2018-07-24 NOTE — Progress Notes (Signed)
Occupational Therapy Evaluation Patient Details Name: Curtis DimesJohn Lamagna MRN: [id number] DOB: 1961-02-22 Today's Date: 07/24/2018    History of Present Illness [name] 58 y/o male s/p R transmetatarsal amputation secondary to osteomyelitis. 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    Clinical Impression   PTA pt PLOF independent in all ADLs working at [employer]. Pt lives alone in an apartment and will require assistance with self care tasks and functional transfers for safety in home setting. Pt currently Min guard to Min A in functional transfers, and Mod A for LB dressing/ bathing due to pain, weightbear restrictions and limited function of RLE. Pt will benefit from continued acute OT therapy to address compensatory techniques and safety awareness for a safe transition to next setting. Pt will requires supervision due limitation and status. D/C recommendation Home Health OT or SNF for safety.     Follow Up Recommendations  Home health OT;SNF;Supervision/Assistance - 24 hour(Home health OT vs SNF)    Equipment Recommendations  3 in 1 bedside commode    Recommendations for Other Services       Precautions / Restrictions Precautions Precautions: Fall Restrictions Weight Bearing Restrictions: Yes RLE Weight Bearing: Touchdown weight bearing      Mobility Bed Mobility Overal bed mobility: Independent Bed Mobility: Supine to Sit     Supine to sit: Independent        Transfers Overall transfer level: Needs assistance Equipment used: Rolling walker (2 wheeled) Transfers: Sit to/from [employer]Stand;Stand Pivot Transfers Sit to Stand: Min guard;Min assist         General transfer comment: Patient required min guard to min A for transfers to toilet for safety. Continuous verbal and tactile cues to maintain weight bearing precautions. Verbal cues to push up with hands and  LLE to stand. Verbal cues for sequencing with RW. Patient unable to maintain weight bearing prcautions during transfers. Further mobility deferred.    Balance Overall balance assessment: Needs assistance Sitting-balance support: Feet supported;No upper extremity supported Sitting balance-Leahy Scale: Good     Standing balance support: Bilateral upper extremity supported Standing balance-Leahy Scale: Poor Standing balance comment: reliant on BUE support and use of RW to maintain standing balance                           ADL either performed or assessed with clinical judgement   ADL Overall ADL's : Needs assistance/impaired Eating/Feeding: Independent   Grooming: Wash/dry hands;Wash/dry face;Oral care;Min guard   Upper Body Bathing: Independent   Lower Body Bathing: Moderate assistance Lower Body Bathing Details (indicate cue type and reason): Pt will require assistance due to dressing and drain of RLE Upper Body Dressing : Independent   Lower Body Dressing: Moderate assistance   Toilet Transfer: Minimal assistance Toilet Transfer Details (indicate cue type and reason): VCs required sequencing and safety of use of RW. Pt demonstrates bringing walker futher away from body. Constant reminders to not put any weight on RLE.  Toileting- Clothing Manipulation and Hygiene: Minimal assistance       Functional mobility during ADLs: Min guard;Minimal assistance;Cueing for safety;Cueing for sequencing;Rolling walker General ADL Comments: Overall pt demonstrates good UE strength to ambulate with walker, however, cueing for safety and sequencing. Due to decreased assistance in home setting pt will require help due to limitations and precautions.     Vision  Perception     Praxis      Pertinent Vitals/Pain Pain Assessment: Faces Faces Pain Scale: Hurts a little bit Pain Location: R foot Pain Descriptors / Indicators: Aching;Operative site guarding;Discomfort Pain  Intervention(s): Monitored during session;Premedicated before session     Hand Dominance Right   Extremity/Trunk Assessment Upper Extremity Assessment Upper Extremity Assessment: Overall WFL for tasks assessed   Lower Extremity Assessment Lower Extremity Assessment: Defer to PT evaluation RLE Deficits / Details: RLE deficits consistent with post op pain and weakness  RLE Sensation: decreased light touch   Cervical / Trunk Assessment Cervical / Trunk Assessment: Normal   Communication Communication Communication: No difficulties   Cognition Arousal/Alertness: Awake/alert Behavior During Therapy: WFL for tasks assessed/performed Overall Cognitive Status: Within Functional Limits for tasks assessed                                     General Comments  Pt has drain connected to RLE which limits LE dressing.    Exercises     Shoulder Instructions      Home Living Family/patient expects to be discharged to:: Private residence Living Arrangements: Alone Available Help at Discharge: Friend(s);Available PRN/intermittently Type of Home: Apartment Home Access: Level entry     Home Layout: One level     Bathroom Shower/Tub: Chief Strategy Officer: Standard     Home Equipment: None   Additional Comments: Pt reports friends will come to drop him off home but may not receive much help in home setting unless he pays someone to come help.      Prior Functioning/Environment Level of Independence: Independent        Comments: working for [employer], reports he uses the computer and often walking between buildings throughout each day         OT Problem List: Impaired balance (sitting and/or standing);Decreased safety awareness;Decreased knowledge of use of DME or AE;Cardiopulmonary status limiting activity      OT Treatment/Interventions: Self-care/ADL training;DME and/or AE instruction;Balance training;Patient/family education    OT  Goals(Current goals can be found in the care plan section) Acute Rehab OT Goals Patient Stated Goal: go home OT Goal Formulation: With patient Time For Goal Achievement: 08/07/18 Potential to Achieve Goals: Good  OT Frequency: Min 2X/week   Barriers to D/C: Inaccessible home environment;Decreased caregiver support  Pt does not have the assistance need to be safe in home environment.        Co-evaluation              AM-PAC OT "6 Clicks" Daily Activity     Outcome Measure Help from another person eating meals?: None Help from another person taking care of personal grooming?: A Little Help from another person toileting, which includes using toliet, bedpan, or urinal?: A Little Help from another person bathing (including washing, rinsing, drying)?: A Lot Help from another person to put on and taking off regular upper body clothing?: None Help from another person to put on and taking off regular lower body clothing?: A Lot 6 Click Score: 18   End of Session Equipment Utilized During Treatment: Gait belt;Rolling walker Nurse Communication: Precautions;Weight bearing status  Activity Tolerance: Patient tolerated treatment well Patient left: with call bell/phone within reach;in bed  OT Visit Diagnosis: Unsteadiness on feet (R26.81);History of falling (Z91.81)                Time: 5993-5701  OT Time Calculation (min): 18 min Charges:  OT General Charges $OT Visit: 1 Visit OT Evaluation $OT Eval Moderate Complexity: 1 Mod  Marquette OldEvan Adonus Uselman, MSOT, OTR/L  Supplemental Rehabilitation Services  [phone] Zigmund Danielvan M Grissel Tyrell 07/24/2018, 11:01 AM

## 2018-07-24 NOTE — Progress Notes (Signed)
Patient discharging home. Discharge instructions explained to patient and he verbalized understanding. Post op shoe at bedside. Wound vac switched to Prevena, with good seal/suction. Took all personal belongings. No further questions or concerns voiced. Patient waiting for his ride.

## 2018-07-24 NOTE — Progress Notes (Signed)
Orthopedic Tech Progress Note Patient Details:  Curtis Bradford 1960-07-12 [id number]  Ortho Devices Type of Ortho Device: Crutches Ortho Device/Splint Interventions: Ordered, Application, Adjustment   Post Interventions Patient Tolerated: Well Instructions Provided: Care of device, Adjustment of device   [name] 07/24/2018, 1:38 PM

## 2018-07-24 NOTE — Progress Notes (Signed)
Physical Therapy Treatment Patient Details Name: Amanpreet Oathout MRN: [id number] DOB: 25-Aug-1960 Today's Date: 07/24/2018    History of Present Illness [name] 58 y/o male s/p R transmetatarsal amputation secondary to osteomyelitis. 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     PT Comments    Pt is due to d/c home today.  He did not like the idea of the RW and wheelchair, but was interested in trying a knee scooter.  We tried our practice knee scooter and he did well (despite cues needed for safety).  I gave him the handout on how to get the scooter and he has decided [facility] ordering will be his best choice.  I asked him how he plans to get around his home in the meantime (as ordering and shipping will take a little time) and he wants crutches.  I encouraged him to practice with me and he declined stating, " I have used them before, I know what to do".  Crutches ordered and ortho tech, [employer] paged.  Mannie to measure the crutches up and have him practice on them.  RN case manager aware of change in equipment recs.  PT to follow acutely until d/c confirmed.    Follow Up Recommendations  Home health PT;Supervision - Intermittent     Equipment Recommendations  Crutches;Other (comment)(knee walker-he will get)    Recommendations for Other Services   NA     Precautions / Restrictions Precautions Precautions: Fall Required Braces or Orthoses: Other Brace Other Brace: wound vac Restrictions Weight Bearing Restrictions: Yes RLE Weight Bearing: Touchdown weight bearing    Mobility  Bed Mobility Overal bed mobility: Independent Bed Mobility: Supine to Sit     Supine to sit: Independent        Transfers Overall transfer level: Needs assistance Equipment used: (knee scooter) Transfers: Sit to/from Stand Sit to Stand: Supervision         General transfer  comment: supervision for safety, cues to lock breaks on knee scooter during transitions.  Demonstration of break function provided.  Ambulation/Gait Ambulation/Gait assistance: Supervision Gait Distance (Feet): 25 Feet Assistive device: (knee scooter) Gait Pattern/deviations: Step-to pattern(hop to) Gait velocity: too fast to be safe   General Gait Details: Pt needed significant cues for safety as he was going too fast and trying to do tricks (see how far he could go with both feet off of the ground as quickly as possible).            Balance Overall balance assessment: Needs assistance Sitting-balance support: Feet supported;No upper extremity supported Sitting balance-Leahy Scale: Normal     Standing balance support: Bilateral upper extremity supported;No upper extremity supported;Single extremity supported Standing balance-Leahy Scale: Fair Standing balance comment: can stand statically without support                            Cognition Arousal/Alertness: Awake/alert Behavior During Therapy: Flat affect Overall Cognitive Status: Within Functional Limits for tasks assessed                                           General Comments General comments (skin integrity, edema, etc.): Pt given knee walker handout and educated on rental vs purchase [employer] is currently  the cheapest).  Pt hopeful to have one delivered to his home tomorrow.  I asked what he wanted to do in the meantime as he had to keep weight off of his foot today and tomorrow until the knee walker comes and his preference is crutches.  He declined practicing with me stating, "I have used them before"      Pertinent Vitals/Pain Pain Assessment: Faces Faces Pain Scale: Hurts little more Pain Location: R foot Pain Descriptors / Indicators: Aching;Operative site guarding;Discomfort Pain Intervention(s): Limited activity within patient's tolerance;Monitored during session;Repositioned     Home Living Family/patient expects to be discharged to:: Private residence Living Arrangements: Alone Available Help at Discharge: Friend(s);Available PRN/intermittently Type of Home: Apartment Home Access: Level entry   Home Layout: One level Home Equipment: None Additional Comments: Pt reports friends will come to drop him off home but may not receive much help in home setting unless he pays someone to come help.    Prior Function Level of Independence: Independent      Comments: working for [employer], reports he uses the computer and often walking between buildings throughout each day    PT Goals (current goals can now be found in the care plan section) Acute Rehab PT Goals Patient Stated Goal: to stop having surgeries and he didn't really want to go home today (wanted a day or two of recovery here) Progress towards PT goals: Progressing toward goals    Frequency    Min 3X/week      PT Plan Equipment recommendations need to be updated       AM-PAC PT "6 Clicks" Mobility   Outcome Measure  Help needed turning from your back to your side while in a flat bed without using bedrails?: None Help needed moving from lying on your back to sitting on the side of a flat bed without using bedrails?: None Help needed moving to and from a bed to a chair (including a wheelchair)?: None Help needed standing up from a chair using your arms (e.g., wheelchair or bedside chair)?: None Help needed to walk in hospital room?: None Help needed climbing 3-5 steps with a railing? : A Little 6 Click Score: 23    End of Session   Activity Tolerance: Patient tolerated treatment well Patient left: in bed;with call bell/phone within reach;Other (comment)(seated EOB)   PT Visit Diagnosis: Other abnormalities of gait and mobility (R26.89);Difficulty in walking, not elsewhere classified (R26.2);Pain Pain - Right/Left: Right Pain - part of body: Ankle and joints of foot     Time:  1242-1310 PT Time Calculation (min) (ACUTE ONLY): 28 min  Charges:  $Gait Training: 8-22 mins $Self Care/Home Management: 8-22                    Artemisia Auvil B. Deaglan Lile, PT, DPT  Acute Rehabilitation [phone] pager #[phone] office   07/24/2018, 1:24 PM

## 2018-07-24 NOTE — Progress Notes (Signed)
Orthopedic Tech Progress Note Patient Details:  Curtis Bradford February 26, 1961 [id number]  Ortho Devices Type of Ortho Device: Postop shoe/boot Ortho Device/Splint Location: Right Foot  Ortho Device/Splint Interventions: Ordered, Adjustment, Application   Post Interventions Patient Tolerated: Well Instructions Provided: Adjustment of device, Care of device   Geoge Lawrance J [name] 07/24/2018, 4:05 PM

## 2018-07-24 NOTE — Care Management Note (Addendum)
Case Management Note  Patient Details  Name: Curtis Bradford MRN: [id number] Date of Birth: [birth year]  Subjective/Objective:  [name] 58 y/o male s/p R transmetatarsal amputation secondary to osteomyelitis.                  Action/Plan: Request for Home Health therapy has been sent to [facility]- [phone]. Patient's [place]#[id number]. [name] ordering a Knee walker from [employer]. [facility] will notify patient of assigned Home Health Agency.  Expected Discharge Date:  07/24/18               Expected Discharge Plan:  Home w Home Health Services  In-House Referral:  NA  Discharge planning Services  CM Consult  Post Acute Care Choice:  Home Health Choice offered to:  NA(Sent to [facility])  DME Arranged: crutches/ will purchase knee walker from [employer] DME Agency:  NA  HH Arranged:  PT HH Agency:  (to be arranged by [facility])  Status of Service:  In process, will continue to follow  If discussed at Long Length of Stay Meetings, dates discussed:    Additional Comments:  Durenda Guthrie, RN 07/24/2018, 1:15 PM

## 2018-07-28 ENCOUNTER — Encounter (INDEPENDENT_AMBULATORY_CARE_PROVIDER_SITE_OTHER): Payer: Self-pay | Admitting: Physician Assistant

## 2018-07-28 ENCOUNTER — Ambulatory Visit (INDEPENDENT_AMBULATORY_CARE_PROVIDER_SITE_OTHER): Payer: Managed Care, Other (non HMO) | Admitting: Physician Assistant

## 2018-07-28 VITALS — Ht 74.0 in | Wt 199.0 lb

## 2018-07-28 DIAGNOSIS — Z89431 Acquired absence of right foot: Secondary | ICD-10-CM

## 2018-07-28 DIAGNOSIS — E11621 Type 2 diabetes mellitus with foot ulcer: Secondary | ICD-10-CM

## 2018-07-28 DIAGNOSIS — L97509 Non-pressure chronic ulcer of other part of unspecified foot with unspecified severity: Secondary | ICD-10-CM

## 2018-07-28 DIAGNOSIS — Z794 Long term (current) use of insulin: Secondary | ICD-10-CM

## 2018-07-28 DIAGNOSIS — S98911S Complete traumatic amputation of right foot, level unspecified, sequela: Secondary | ICD-10-CM

## 2018-07-28 NOTE — Progress Notes (Signed)
Office Visit Note   Patient: Curtis DimesJohn Bradford           Date of Birth: [birth year]           MRN: [id number] Visit Date: 07/28/2018              Requested by: No referring provider defined for this encounter. PCP: Patient, No Pcp Per  Chief Complaint  Patient presents with  . Right Foot - Routine Post Op    07/23/2018 TMA      HPI: [name] a 58 year old gentleman who presents status post transmetatarsal amputation on the right.  He is about 5 days out from surgery.  Patient states the wound VAC canister completely filled and he presents at this time for initial evaluation.  Assessment & Plan: Visit Diagnoses:  1. Type 2 diabetes mellitus with foot ulcer, with long-term current use of insulin (HCC)   2. Amputation of right foot, sequela (HCC)     Plan: Patient will continue nonweightbearing and elevation recommended Dial soap cleansing dry dressing changes with an Ace wrap change daily.  Continue with a kneeling scooter nonweightbearing.  Follow-Up Instructions: Return in about 2 weeks (around 08/11/2018).   Ortho Exam  [name] alert, oriented, no adenopathy, well-dressed, normal affect, normal respiratory effort. Examination there is maceration and swelling of the right foot the blood clot is removed.  The wound edges are well approximated there is no signs of infection he will continue with his doxycycline.  Discussed that we will evaluate for return to work but at this point it is doubtful that he could return to work in 2 weeks he walks over 4 miles a day with work.  Imaging: No results found. No images are attached to the encounter.  Labs: Lab Results  Component Value Date   HGBA1C 8.3 (H) 07/23/2018   HGBA1C 7.4 (H) 04/23/2018   HGBA1C 6.5 (A) 01/31/2018   ESRSEDRATE 31 (H) 02/14/2017   CRP 12.3 (H) 02/14/2017   REPTSTATUS 02/20/2017 FINAL 02/14/2017   REPTSTATUS 02/20/2017 FINAL 02/14/2017   CULT  02/14/2017    NO GROWTH 5 DAYS Performed at [facility], [address] [place], [place] [address]    CULT  02/14/2017    NO GROWTH 5 DAYS Performed at [facility], [address] [place], [place] [address]      Lab Results  Component Value Date   ALBUMIN 4.1 01/31/2018   ALBUMIN 3.7 10/30/2017   ALBUMIN 3.0 (L) 02/15/2017   PREALBUMIN 13.8 (L) 02/15/2017    Body mass index is 25.55 kg/m.  Orders:  No orders of the defined types were placed in this encounter.  No orders of the defined types were placed in this encounter.    Procedures: No procedures performed  Clinical Data: No additional findings.  ROS:  All other systems negative, except as noted in the HPI. Review of Systems  Objective: Vital Signs: Ht 6\' 2"  (1.88 m)   Wt 199 lb (90.3 kg)   BMI 25.55 kg/m   Specialty Comments:  No specialty comments available.  PMFS History: Patient Active Problem List   Diagnosis Date Noted  . Status post transmetatarsal amputation of foot, right (HCC) 07/23/2018  . Osteomyelitis (HCC) 07/23/2018  . TIA (transient ischemic attack) 10/30/2017  . Medication monitoring encounter 03/28/2017  . Status post amputation of toe of right foot (HCC) 03/05/2017  . Subacute osteomyelitis, right ankle and foot (HCC)   . Peripheral neuropathy 02/16/2017  .  Peripheral artery disease (HCC) 02/16/2017  . 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 Diabetic foot infection (HCC) 02/14/2017  . Type II diabetes mellitus with renal manifestations (HCC)   . HLD (hyperlipidemia)   . Tobacco abuse   . CKD stage 2 due to type 2 diabetes mellitus (HCC)   . CAD (coronary artery disease)   . Seizure disorder [facility])    Past Medical History:  Diagnosis Date  . CAD (coronary artery disease)   . CKD (chronic kidney disease), stage III (HCC)   . 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 History of DVT (deep vein thrombosis)   . History of kidney stones   . HLD (hyperlipidemia)   .  Myocardial infarction (HCC) 2009  . Peripheral vascular disease (HCC)   . Seizure (HCC)    last one 2015  . Stroke [facility]) 2012, 10/2017   2012-speech was effected- has come back.10/2017- numbness of right side no taste right  side. 2012-  . Tobacco abuse     Family History  Problem Relation Age of Onset  . Diabetes Mellitus II Mother   . Diabetes Mellitus II Father     Past Surgical History:  Procedure Laterality Date  . AMPUTATION Right 02/23/2017   Procedure: RIGHT FOOT 5TH RAY AMPUTATION;  Surgeon: [name], MD;  Location: [facility] OR;  Service: Orthopedics;  Laterality: Right;  . AMPUTATION Right 04/23/2018   Procedure: Right Great Toe Amputation;  Surgeon: [name], MD;  Location: [facility] OR;  Service: Orthopedics;  Laterality: Right;  . AMPUTATION Right 07/23/2018   Procedure: RIGHT TRANSMETATARSAL AMPUTATION, APPLY WOUND VAC;  Surgeon: [name], MD;  Location: [facility] OR;  Service: Orthopedics;  Laterality: Right;  . CORONARY ANGIOPLASTY  03/05/2008   stent  . FOOT SURGERY    . PERIPHERAL ARTERIAL STENT GRAFT Right    leg 2 stents  . STENT PLACEMENT VASCULAR ([facility] HX)     Several in right leg  . TONSILLECTOMY    . TRANSMETATARSAL AMPUTATION Right 07/23/2018   Social History   Occupational History  . Not on file  Tobacco Use  . Smoking status: Light Tobacco Smoker    Years: 15.00    Types: Cigarettes  . Smokeless tobacco: Never Used  . Tobacco comment: less than a pack a week  Substance and Sexual Activity  . Alcohol use: No    Comment: seldom  . Drug use: No  . Sexual activity: Not on file

## 2018-07-31 ENCOUNTER — Other Ambulatory Visit: Payer: Managed Care, Other (non HMO)

## 2018-08-01 IMAGING — CT CT ANGIO NECK
2 of 7 series · 8 of 33 positions shown · IV contrast (OMNI 350)
Comparison: Brain MRI and MRA from earlier today atherosclerotic
calcification. Two vessel branching.

CLINICAL DATA: Stroke follow-up

EXAM:
CT ANGIOGRAPHY NECK
TECHNIQUE: Multidetector CT imaging of the neck was performed using the
standard protocol during bolus administration of intravenous
contrast. Multiplanar CT image reconstructions and MIPs were
obtained to evaluate the vascular anatomy. Carotid stenosis
measurements (when applicable) are obtained utilizing NASCET
criteria, using the distal internal carotid diameter as the
denominator.
CONTRAST:  50mL [id number]-FUO IOPAMIDOL ([id number]-FUO) INJECTION 76%

[Series 5: cta neck · axial · 0.42mm/px · z∈[-242,-154]mm · 2 of 133 slices shown]
[im 45/133  soft-tissue]
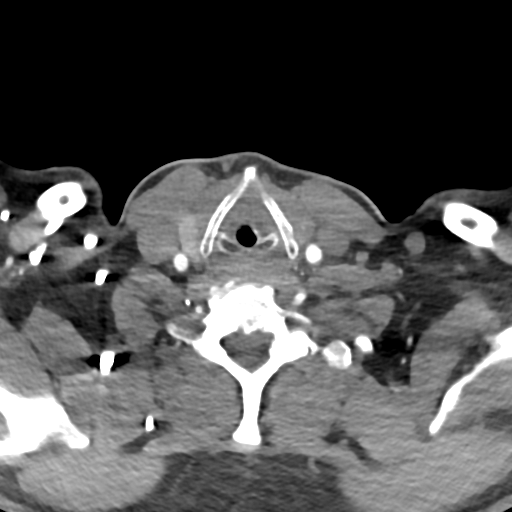
[im 89/133  soft-tissue]
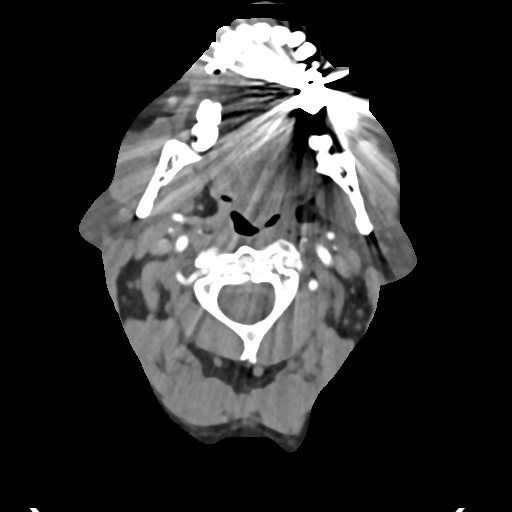

[Series 7: cta neck axial · axial · 0.39mm/px · z∈[-292,-103]mm · 6 of 265 slices shown]
[im 38/265  soft-tissue]
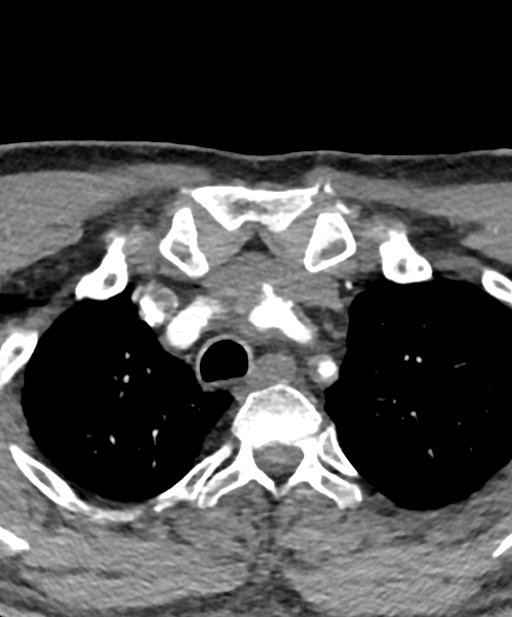
[im 76/265  bone]
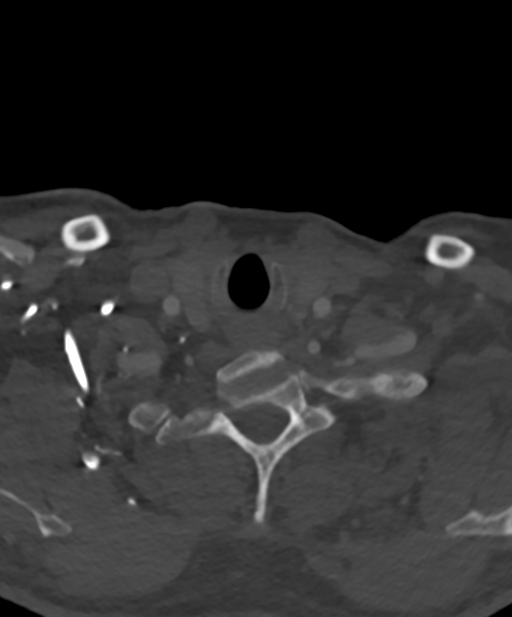
[im 114/265  soft-tissue]
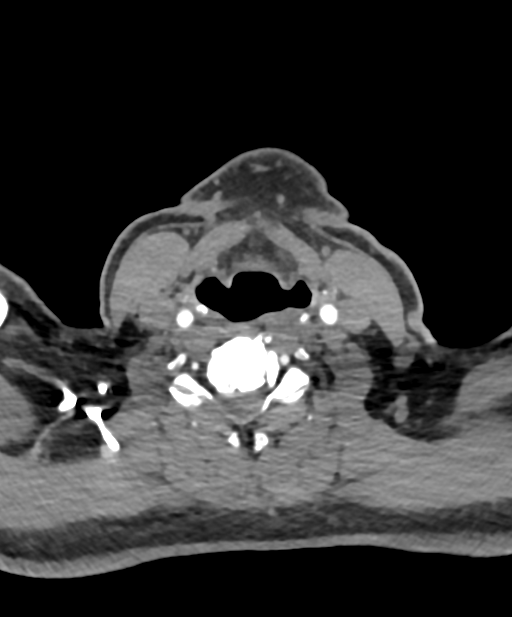
[im 151/265  bone]
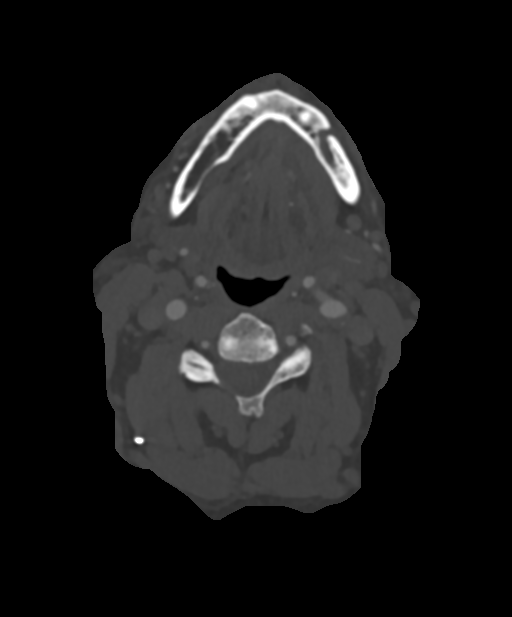
[im 189/265  soft-tissue]
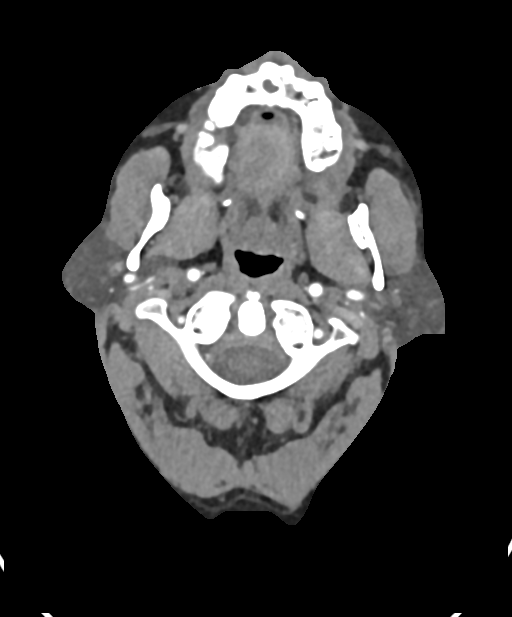
[im 227/265  bone]
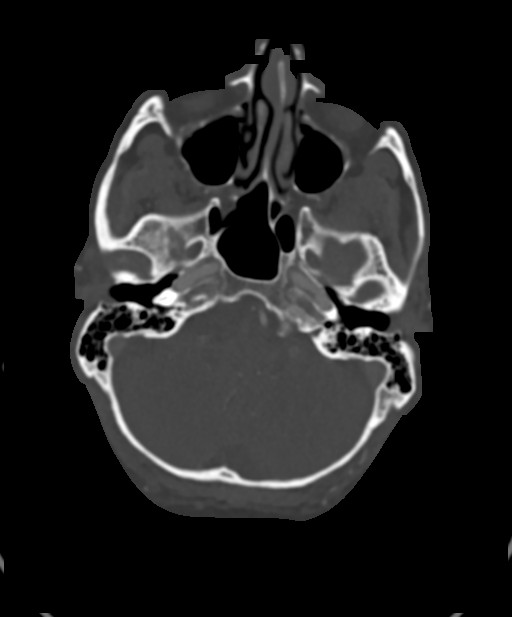

[8 of 33 positions shown; findings below may reference images not displayed]

FINDINGS: Aortic arch: Noncalcified plaque along the brachiocephalic without
flow limiting stenosis or ulceration.

Right carotid system: Mild mixed density plaque at the common
carotid bifurcation and ICA bulb without stenosis or ulceration.

Left carotid system: Mild atheromatous plaque at the common carotid
origin and bifurcation. No stenosis or ulceration. Negative for
beading.

Vertebral arteries: Proximal subclavian primarily noncalcified
atherosclerosis on the left with 40% stenosis. Mild left vertebral
artery dominance. The vertebral arteries are smooth and diffusely
patent to the dura.

Skeleton: Diffuse cervical spine degeneration with mild
dextrocurvature. No acute finding.

Other neck: Negative

Upper chest: Negative
IMPRESSION: 1. Atherosclerosis without flow limiting stenosis or ulceration.
2. 40% proximal left subclavian stenosis.

## 2018-08-07 ENCOUNTER — Encounter (INDEPENDENT_AMBULATORY_CARE_PROVIDER_SITE_OTHER): Payer: Self-pay | Admitting: Physician Assistant

## 2018-08-07 ENCOUNTER — Ambulatory Visit: Payer: Managed Care, Other (non HMO) | Admitting: Endocrinology

## 2018-08-07 ENCOUNTER — Ambulatory Visit (INDEPENDENT_AMBULATORY_CARE_PROVIDER_SITE_OTHER): Payer: Managed Care, Other (non HMO) | Admitting: Physician Assistant

## 2018-08-07 ENCOUNTER — Telehealth: Payer: Self-pay | Admitting: Endocrinology

## 2018-08-07 VITALS — Ht 74.0 in | Wt 199.0 lb

## 2018-08-07 DIAGNOSIS — E1122 Type 2 diabetes mellitus with diabetic chronic kidney disease: Secondary | ICD-10-CM

## 2018-08-07 DIAGNOSIS — N182 Chronic kidney disease, stage 2 (mild): Secondary | ICD-10-CM

## 2018-08-07 DIAGNOSIS — Z0289 Encounter for other administrative examinations: Secondary | ICD-10-CM

## 2018-08-07 DIAGNOSIS — S98911S Complete traumatic amputation of right foot, level unspecified, sequela: Secondary | ICD-10-CM

## 2018-08-07 DIAGNOSIS — E1142 Type 2 diabetes mellitus with diabetic polyneuropathy: Secondary | ICD-10-CM

## 2018-08-07 DIAGNOSIS — Z89431 Acquired absence of right foot: Secondary | ICD-10-CM

## 2018-08-07 DIAGNOSIS — Z794 Long term (current) use of insulin: Secondary | ICD-10-CM

## 2018-08-07 MED ORDER — OXYCODONE-ACETAMINOPHEN 5-325 MG PO TABS: 1.0000 | ORAL_TABLET | Freq: Four times a day (QID) | ORAL | 0 refills | Status: DC | PRN

## 2018-08-07 MED ORDER — METHOCARBAMOL 500 MG PO TABS: 500.0000 mg | ORAL_TABLET | Freq: Four times a day (QID) | ORAL | 1 refills | Status: DC | PRN

## 2018-08-07 MED ORDER — SULFAMETHOXAZOLE-TRIMETHOPRIM 800-160 MG PO TABS: 1.0000 | ORAL_TABLET | Freq: Two times a day (BID) | ORAL | 0 refills | Status: DC

## 2018-08-07 NOTE — Telephone Encounter (Signed)
Needs to be seen at the earliest possible

## 2018-08-07 NOTE — Telephone Encounter (Signed)
Patient no showed today's appt. Please advise on how to follow up. °A. No follow up necessary. °B. Follow up urgent. Contact patient immediately. °C. Follow up necessary. Contact patient and schedule visit in ___ days. °D. Follow up advised. Contact patient and schedule visit in ____weeks. ° °Would you like the NS fee to be applied to this visit? ° °

## 2018-08-07 NOTE — Progress Notes (Addendum)
Office Visit Note   Patient: Curtis Bradford           Date of Birth: [birth year]           MRN: [id number] Visit Date: 08/07/2018              Requested by: No referring provider defined for this encounter. PCP: Patient, No Pcp Per  Chief Complaint  Patient presents with  . Right Foot - Routine Post Op    07/23/2018 right TMA      HPI: The patient is a 58 yo gentleman who is seen for post operative follow up following a right transmetatarsal amputation on 07/23/2018. He reports moderate pain over the amputation site. He reports trying to maintain non weight bearing as much as possible using a knee scooter. He reports moderate bloody drainage on the dressings.   Assessment & Plan: Visit Diagnoses:  1. S/P transmetatarsal amputation of foot, right (HCC)   2. Amputation of right foot, sequela (HCC)   3. Type 2 diabetes mellitus with diabetic polyneuropathy, with long-term current use of insulin (HCC)   4. CKD stage 2 due to type 2 diabetes mellitus (HCC)     Plan: Will leave sutures in place. Will begin Septra DS 1 po BID .  Percocet refilled this visit.  Dry gauze dressing to the site daily and use Ace wrap to help control edema. Given note for out of work for at least 2 more weeks. Continue to elevate and off load with knee scooter for ambulation.. Follow up in 1 week.   Follow-Up Instructions: Return in about 1 week (around 08/14/2018).   Ortho Exam  [name] alert, oriented, no adenopathy, well-dressed, normal affect, normal respiratory effort. Right transmetatarsal amputation site with moderate edema,Sutures in place and under tension,  mild erythema. Minimal serosanguinous drainage. No odor.  Palpable pedal pulse.   Imaging: No results found.   Labs: Lab Results  Component Value Date   HGBA1C 8.3 (H) 07/23/2018   HGBA1C 7.4 (H) 04/23/2018   HGBA1C 6.5 (A) 01/31/2018   ESRSEDRATE 31 (H) 02/14/2017   CRP 12.3 (H) 02/14/2017   REPTSTATUS 02/20/2017 FINAL 02/14/2017   REPTSTATUS 02/20/2017 FINAL 02/14/2017   CULT  02/14/2017    NO GROWTH 5 DAYS Performed at [facility], [address], [place], [place] [address]    CULT  02/14/2017    NO GROWTH 5 DAYS Performed at [facility], [address], [place], [place] [address]      Lab Results  Component Value Date   ALBUMIN 4.1 01/31/2018   ALBUMIN 3.7 10/30/2017   ALBUMIN 3.0 (L) 02/15/2017   PREALBUMIN 13.8 (L) 02/15/2017    Body mass index is 25.55 kg/m.  Orders:  No orders of the defined types were placed in this encounter.  Meds ordered this encounter  Medications  . oxyCODONE-acetaminophen (PERCOCET/ROXICET) 5-325 MG tablet    Sig: Take 1 tablet by mouth every 6 (six) hours as needed for moderate pain or severe pain.    Dispense:  30 tablet    Refill:  0  . sulfamethoxazole-trimethoprim (BACTRIM DS,SEPTRA DS) 800-160 MG tablet    Sig: Take 1 tablet by mouth 2 (two) times daily.    Dispense:  28 tablet    Refill:  0  . DISCONTD: methocarbamol (ROBAXIN) 500 MG tablet    Sig: Take 1 tablet (500 mg total) by mouth every 6 (six) hours as needed for muscle spasms.    Dispense:  60 tablet    Refill:  1     Procedures: No procedures performed  Clinical Data: No additional findings.  ROS:  All other systems negative, except as noted in the HPI. Review of Systems  Objective: Vital Signs: Ht 6\' 2"  (1.88 m)   Wt 199 lb (90.3 kg)   BMI 25.55 kg/m   Specialty Comments:  No specialty comments available.  PMFS History: Patient Active Problem List   Diagnosis Date Noted  . Status post transmetatarsal amputation of foot, right (HCC) 07/23/2018  . Osteomyelitis (HCC) 07/23/2018  . TIA (transient ischemic attack) 10/30/2017  . Medication monitoring encounter 03/28/2017  . Status post amputation of toe of right foot (HCC) 03/05/2017  . Subacute osteomyelitis, right ankle and foot (HCC)   . Peripheral neuropathy 02/16/2017  . Peripheral artery disease (HCC) 02/16/2017   . 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 Diabetic foot infection (HCC) 02/14/2017  . Type II diabetes mellitus with renal manifestations (HCC)   . HLD (hyperlipidemia)   . Tobacco abuse   . CKD stage 2 due to type 2 diabetes mellitus (HCC)   . CAD (coronary artery disease)   . Seizure disorder [facility](HCC)    Past Medical History:  Diagnosis Date  . CAD (coronary artery disease)   . CKD (chronic kidney disease), stage III (HCC)   . 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 History of DVT (deep vein thrombosis)   . History of kidney stones   . HLD (hyperlipidemia)   . Myocardial infarction (HCC) 2009  . Peripheral vascular disease (HCC)   . Seizure (HCC)    last one 2015  . Stroke [facility](HCC) 2012, 10/2017   2012-speech was effected- has come back.10/2017- numbness of right side no taste right  side. 2012-  . Tobacco abuse     Family History  Problem Relation Age of Onset  . Diabetes Mellitus II Mother   . Diabetes Mellitus II Father     Past Surgical History:  Procedure Laterality Date  . AMPUTATION Right 02/23/2017   Procedure: RIGHT FOOT 5TH RAY AMPUTATION;  Surgeon: Nadara Mustarduda, Marcus V, MD;  [facility];  Service: Orthopedics;  Laterality: Right;  . AMPUTATION Right 04/23/2018   Procedure: Right Great Toe Amputation;  Surgeon: Nadara Mustarduda, Marcus V, MD;  [facility];  Service: Orthopedics;  Laterality: Right;  . AMPUTATION Right 07/23/2018   Procedure: RIGHT TRANSMETATARSAL AMPUTATION, APPLY WOUND VAC;  Surgeon: Nadara Mustarduda, Marcus V, MD;  Location: [facility] OR;  Service: Orthopedics;  Laterality: Right;  . CORONARY ANGIOPLASTY  03/05/2008   stent  . FOOT SURGERY    . PERIPHERAL ARTERIAL STENT GRAFT Right    leg 2 stents  . STENT PLACEMENT VASCULAR ([facility] HX)     Several in right leg  . TONSILLECTOMY    . TRANSMETATARSAL AMPUTATION Right 07/23/2018   Social History   Occupational History  . Not on file  Tobacco Use  . Smoking status: Light  Tobacco Smoker    Years: 15.00    Types: Cigarettes  . Smokeless tobacco: Never Used  . Tobacco comment: less than a pack a week  Substance and Sexual Activity  . Alcohol use: No    Comment: seldom  . Drug use: No  . Sexual activity: Not on file

## 2018-08-08 ENCOUNTER — Encounter (INDEPENDENT_AMBULATORY_CARE_PROVIDER_SITE_OTHER): Payer: Self-pay | Admitting: Physician Assistant

## 2018-08-11 ENCOUNTER — Ambulatory Visit (INDEPENDENT_AMBULATORY_CARE_PROVIDER_SITE_OTHER): Payer: Managed Care, Other (non HMO) | Admitting: Orthopedic Surgery

## 2018-08-11 ENCOUNTER — Telehealth: Payer: Self-pay | Admitting: Endocrinology

## 2018-08-11 NOTE — Telephone Encounter (Signed)
LMTCB and reschedule appointment at earliest time per Dr Lucianne Muss

## 2018-08-14 ENCOUNTER — Ambulatory Visit (INDEPENDENT_AMBULATORY_CARE_PROVIDER_SITE_OTHER): Payer: Managed Care, Other (non HMO) | Admitting: Orthopedic Surgery

## 2018-08-14 ENCOUNTER — Encounter (INDEPENDENT_AMBULATORY_CARE_PROVIDER_SITE_OTHER): Payer: Self-pay | Admitting: Physician Assistant

## 2018-08-14 VITALS — Ht 74.0 in | Wt 199.0 lb

## 2018-08-14 DIAGNOSIS — Z89431 Acquired absence of right foot: Secondary | ICD-10-CM

## 2018-08-14 NOTE — Progress Notes (Signed)
Office Visit Note   Patient: Curtis Bradford           Date of Birth: [birth year]           MRN: [id number] Visit Date: 08/14/2018              Requested by: No referring provider defined for this encounter. PCP: Patient, No Pcp Per  Chief Complaint  Patient presents with  . Right Foot - Routine Post Op    07/23/2018 Right TMA      HPI: [name] a 58 year old gentleman who presents for initial evaluation status post right transmetatarsal amputation he is 3 weeks out he states he tries to use a kneeling scooter but he has been walking on his foot and did drive in for his appointment.  Assessment & Plan: Visit Diagnoses:  1. S/P transmetatarsal amputation of foot, right (HCC)     Plan: Discussed importance of elevation compression nonweightbearing we will harvest the sutures today a stump shrinker was applied he will wear the compression around the clock wash his foot with soap and water follow-up next week  Follow-Up Instructions: Return in about 1 week (around 08/21/2018).   Ortho Exam  [name] alert, oriented, no adenopathy, well-dressed, normal affect, normal respiratory effort. Examination patient has significant swelling with wound dehiscence the sutures are buried from the swelling.  The wound is gaped open about a centimeter and is only 3 mm deep.  There is no ascending cellulitis no signs of infection.  Imaging: No results found. No images are attached to the encounter.  Labs: Lab Results  Component Value Date   HGBA1C 8.3 (H) 07/23/2018   HGBA1C 7.4 (H) 04/23/2018   HGBA1C 6.5 (A) 01/31/2018   ESRSEDRATE 31 (H) 02/14/2017   CRP 12.3 (H) 02/14/2017   REPTSTATUS 02/20/2017 FINAL 02/14/2017   REPTSTATUS 02/20/2017 FINAL 02/14/2017   CULT  02/14/2017    NO GROWTH 5 DAYS Performed at [facility], [address] [place], [place] [address]    CULT  02/14/2017    NO GROWTH 5 DAYS Performed at [facility], [address] [place], [place]  [address]      Lab Results  Component Value Date   ALBUMIN 4.1 01/31/2018   ALBUMIN 3.7 10/30/2017   ALBUMIN 3.0 (L) 02/15/2017   PREALBUMIN 13.8 (L) 02/15/2017    Body mass index is 25.55 kg/m.  Orders:  No orders of the defined types were placed in this encounter.  No orders of the defined types were placed in this encounter.    Procedures: No procedures performed  Clinical Data: No additional findings.  ROS:  All other systems negative, except as noted in the HPI. Review of Systems  Objective: Vital Signs: Ht 6\' 2"  (1.88 m)   Wt 199 lb (90.3 kg)   BMI 25.55 kg/m   Specialty Comments:  No specialty comments available.  PMFS History: Patient Active Problem List   Diagnosis Date Noted  . Status post transmetatarsal amputation of foot, right (HCC) 07/23/2018  . Osteomyelitis (HCC) 07/23/2018  . TIA (transient ischemic attack) 10/30/2017  . Medication monitoring encounter 03/28/2017  . Status post amputation of toe of right foot (HCC) 03/05/2017  . Subacute osteomyelitis, right ankle and foot (HCC)   . Peripheral neuropathy 02/16/2017  . Peripheral artery disease (HCC) 02/16/2017  . 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 Diabetic foot infection (HCC) 02/14/2017  . Type II diabetes mellitus with  renal manifestations (HCC)   . HLD (hyperlipidemia)   . Tobacco abuse   . CKD stage 2 due to type 2 diabetes mellitus (HCC)   . CAD (coronary artery disease)   . Seizure disorder [facility])    Past Medical History:  Diagnosis Date  . CAD (coronary artery disease)   . CKD (chronic kidney disease), stage III (HCC)   . 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 History of DVT (deep vein thrombosis)   . History of kidney stones   . HLD (hyperlipidemia)   . Myocardial infarction (HCC) 2009  . Peripheral vascular disease (HCC)   . Seizure (HCC)    last one 2015  . Stroke [facility]) 2012, 10/2017   2012-speech was effected- has  come back.10/2017- numbness of right side no taste right  side. 2012-  . Tobacco abuse     Family History  Problem Relation Age of Onset  . Diabetes Mellitus II Mother   . Diabetes Mellitus II Father     Past Surgical History:  Procedure Laterality Date  . AMPUTATION Right 02/23/2017   Procedure: RIGHT FOOT 5TH RAY AMPUTATION;  Surgeon: [name], MD;  [facility];  Service: Orthopedics;  Laterality: Right;  . AMPUTATION Right 04/23/2018   Procedure: Right Great Toe Amputation;  Surgeon: [name], MD;  Location: [facility] OR;  Service: Orthopedics;  Laterality: Right;  . AMPUTATION Right 07/23/2018   Procedure: RIGHT TRANSMETATARSAL AMPUTATION, APPLY WOUND VAC;  Surgeon: [name], MD;  Location: [facility] OR;  Service: Orthopedics;  Laterality: Right;  . CORONARY ANGIOPLASTY  03/05/2008   stent  . FOOT SURGERY    . PERIPHERAL ARTERIAL STENT GRAFT Right    leg 2 stents  . STENT PLACEMENT VASCULAR ([facility] HX)     Several in right leg  . TONSILLECTOMY    . TRANSMETATARSAL AMPUTATION Right 07/23/2018   Social History   Occupational History  . Not on file  Tobacco Use  . Smoking status: Light Tobacco Smoker    Years: 15.00    Types: Cigarettes  . Smokeless tobacco: Never Used  . Tobacco comment: less than a pack a week  Substance and Sexual Activity  . Alcohol use: No    Comment: seldom  . Drug use: No  . Sexual activity: Not on file

## 2018-08-14 NOTE — Patient Instructions (Signed)
[name] a 58 year old gentleman who presents status post right transmetatarsal amputation.  He is about 3 weeks out from surgery he states he has not been completely compliant he has been walking and drove in for his appointment.

## 2018-08-21 ENCOUNTER — Encounter (INDEPENDENT_AMBULATORY_CARE_PROVIDER_SITE_OTHER): Payer: Self-pay | Admitting: Physician Assistant

## 2018-08-21 ENCOUNTER — Ambulatory Visit (INDEPENDENT_AMBULATORY_CARE_PROVIDER_SITE_OTHER): Payer: Managed Care, Other (non HMO) | Admitting: Physician Assistant

## 2018-08-21 ENCOUNTER — Encounter (INDEPENDENT_AMBULATORY_CARE_PROVIDER_SITE_OTHER): Payer: Self-pay | Admitting: Orthopedic Surgery

## 2018-08-21 VITALS — Ht 74.0 in | Wt 199.0 lb

## 2018-08-21 DIAGNOSIS — Z89431 Acquired absence of right foot: Secondary | ICD-10-CM

## 2018-08-21 DIAGNOSIS — E1122 Type 2 diabetes mellitus with diabetic chronic kidney disease: Secondary | ICD-10-CM

## 2018-08-21 DIAGNOSIS — S98911S Complete traumatic amputation of right foot, level unspecified, sequela: Secondary | ICD-10-CM

## 2018-08-21 DIAGNOSIS — N182 Chronic kidney disease, stage 2 (mild): Secondary | ICD-10-CM

## 2018-08-21 DIAGNOSIS — E1142 Type 2 diabetes mellitus with diabetic polyneuropathy: Secondary | ICD-10-CM

## 2018-08-21 DIAGNOSIS — Z794 Long term (current) use of insulin: Secondary | ICD-10-CM

## 2018-08-21 MED ORDER — HYDROCODONE-ACETAMINOPHEN 5-325 MG PO TABS: 1.0000 | ORAL_TABLET | Freq: Four times a day (QID) | ORAL | 0 refills | Status: DC | PRN

## 2018-08-21 MED ORDER — SULFAMETHOXAZOLE-TRIMETHOPRIM 800-160 MG PO TABS: 1.0000 | ORAL_TABLET | Freq: Two times a day (BID) | ORAL | 0 refills | Status: DC

## 2018-08-21 MED ORDER — PENTOXIFYLLINE ER 400 MG PO TBCR: 400.0000 mg | EXTENDED_RELEASE_TABLET | Freq: Three times a day (TID) | ORAL | 3 refills | Status: DC

## 2018-08-21 MED ORDER — NITROGLYCERIN 0.4 MG/HR TD PT24: 0.4000 mg | MEDICATED_PATCH | Freq: Every day | TRANSDERMAL | 12 refills | Status: DC

## 2018-08-21 NOTE — Progress Notes (Signed)
Office Visit Note   Patient: Curtis Bradford           Date of Birth: 1960/07/26           MRN: [id number] Visit Date: 08/21/2018              Requested by: No referring provider defined for this encounter. PCP: Patient, No Pcp Per  Chief Complaint  Patient presents with  . Right Foot - Routine Post Op    07/23/2018 right foot transmet amputation       HPI: The patient is a 58 year old gentleman who presents for follow-up of a right transmetatarsal amputation on 07/23/2018.  He has been trying to maintain nonweightbearing and utilizing a knee scooter but does endorse that he has had to walk on his foot at times.  He reports continued bloody drainage at times on his dressings and some odor.  Assessment & Plan: Visit Diagnoses:  1. S/P transmetatarsal amputation of foot, right (HCC)   2. Type 2 diabetes mellitus with diabetic polyneuropathy, with long-term current use of insulin (HCC)   3. CKD stage 2 due to type 2 diabetes mellitus (HCC)   4. Amputation of right foot, sequela (HCC)     Plan: Will start Septra DS 1 p.o. twice daily times the next 14 days, Trental 400 mg p.o. 3 times daily and nitroglycerin patches daily alternating the site on the foot.  Instructed the patient on the necessity of strict nonweightbearing and utilizing the scooter is much as possible.  He will follow-up in 2 weeks or sooner should he have difficulties in the interim. Follow-Up Instructions: Return in about 2 weeks (around 09/04/2018).   Ortho Exam  [name] alert, oriented, no adenopathy, well-dressed, normal affect, normal respiratory effort. The right transmetatarsal amputation site does continue to have edema and there is dehiscence centrally.  There is no signs of a sending cellulitis but some mild localized erythema and odor was noted today.  There does not appear to be any tracking to bone or visible tendons.  He is reporting pain with palpation today.  Imaging: No results found.   Labs: Lab  Results  Component Value Date   HGBA1C 8.3 (H) 07/23/2018   HGBA1C 7.4 (H) 04/23/2018   HGBA1C 6.5 (A) 01/31/2018   ESRSEDRATE 31 (H) 02/14/2017   CRP 12.3 (H) 02/14/2017   REPTSTATUS 02/20/2017 FINAL 02/14/2017   REPTSTATUS 02/20/2017 FINAL 02/14/2017   CULT  02/14/2017    NO GROWTH 5 DAYS Performed at [facility], [address], [place], [place] [address]    CULT  02/14/2017    NO GROWTH 5 DAYS Performed at [facility], [address] [place], [place] [address]      Lab Results  Component Value Date   ALBUMIN 4.1 01/31/2018   ALBUMIN 3.7 10/30/2017   ALBUMIN 3.0 (L) 02/15/2017   PREALBUMIN 13.8 (L) 02/15/2017    Body mass index is 25.55 kg/m.  Orders:  No orders of the defined types were placed in this encounter.  Meds ordered this encounter  Medications  . sulfamethoxazole-trimethoprim (BACTRIM DS,SEPTRA DS) 800-160 MG tablet    Sig: Take 1 tablet by mouth 2 (two) times daily.    Dispense:  28 tablet    Refill:  0  . pentoxifylline (TRENTAL) 400 MG CR tablet    Sig: Take 1 tablet (400 mg total) by mouth 3 (three) times daily with meals.    Dispense:  90 tablet  Refill:  3  . nitroGLYCERIN (NITRODUR - DOSED IN MG/24 HR) 0.4 mg/hr patch    Sig: Place 1 patch (0.4 mg total) onto the skin daily.    Dispense:  30 patch    Refill:  12  . HYDROcodone-acetaminophen (NORCO/VICODIN) 5-325 MG tablet    Sig: Take 1 tablet by mouth every 6 (six) hours as needed for moderate pain.    Dispense:  20 tablet    Refill:  0     Procedures: No procedures performed  Clinical Data: No additional findings.  ROS:  All other systems negative, except as noted in the HPI. Review of Systems  Objective: Vital Signs: Ht 6\' 2"  (1.88 m)   Wt 199 lb (90.3 kg)   BMI 25.55 kg/m   Specialty Comments:  No specialty comments available.  PMFS History: Patient Active Problem List   Diagnosis Date Noted  . Status post transmetatarsal amputation of foot, right  (HCC) 07/23/2018  . Osteomyelitis (HCC) 07/23/2018  . TIA (transient ischemic attack) 10/30/2017  . Medication monitoring encounter 03/28/2017  . Status post amputation of toe of right foot (HCC) 03/05/2017  . Subacute osteomyelitis, right ankle and foot (HCC)   . Peripheral neuropathy 02/16/2017  . Peripheral artery disease (HCC) 02/16/2017  . 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 Diabetic foot infection (HCC) 02/14/2017  . Type II diabetes mellitus with renal manifestations (HCC)   . HLD (hyperlipidemia)   . Tobacco abuse   . CKD stage 2 due to type 2 diabetes mellitus (HCC)   . CAD (coronary artery disease)   . Seizure disorder [facility](HCC)    Past Medical History:  Diagnosis Date  . CAD (coronary artery disease)   . CKD (chronic kidney disease), stage III (HCC)   . 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 History of DVT (deep vein thrombosis)   . History of kidney stones   . HLD (hyperlipidemia)   . Myocardial infarction (HCC) 2009  . Peripheral vascular disease (HCC)   . Seizure (HCC)    last one 2015  . Stroke [facility](HCC) 2012, 10/2017   2012-speech was effected- has come back.10/2017- numbness of right side no taste right  side. 2012-  . Tobacco abuse     Family History  Problem Relation Age of Onset  . Diabetes Mellitus II Mother   . Diabetes Mellitus II Father     Past Surgical History:  Procedure Laterality Date  . AMPUTATION Right 02/23/2017   Procedure: RIGHT FOOT 5TH RAY AMPUTATION;  Surgeon: Nadara Mustarduda, Marcus V, MD;  [facility];  Service: Orthopedics;  Laterality: Right;  . AMPUTATION Right 04/23/2018   Procedure: Right Great Toe Amputation;  Surgeon: Nadara Mustarduda, Marcus V, MD;  [facility];  Service: Orthopedics;  Laterality: Right;  . AMPUTATION Right 07/23/2018   Procedure: RIGHT TRANSMETATARSAL AMPUTATION, APPLY WOUND VAC;  Surgeon: Nadara Mustarduda, Marcus V, MD;  Location: [facility] OR;  Service: Orthopedics;  Laterality: Right;  . CORONARY  ANGIOPLASTY  03/05/2008   stent  . FOOT SURGERY    . PERIPHERAL ARTERIAL STENT GRAFT Right    leg 2 stents  . STENT PLACEMENT VASCULAR ([facility] HX)     Several in right leg  . TONSILLECTOMY    . TRANSMETATARSAL AMPUTATION Right 07/23/2018   Social History   Occupational History  . Not on file  Tobacco Use  . Smoking status: Light Tobacco Smoker    Years: 15.00  Types: Cigarettes  . Smokeless tobacco: Never Used  . Tobacco comment: less than a pack a week  Substance and Sexual Activity  . Alcohol use: No    Comment: seldom  . Drug use: No  . Sexual activity: Not on file

## 2018-08-24 ENCOUNTER — Encounter (INDEPENDENT_AMBULATORY_CARE_PROVIDER_SITE_OTHER): Payer: Self-pay | Admitting: Physician Assistant

## 2018-08-27 ENCOUNTER — Ambulatory Visit (INDEPENDENT_AMBULATORY_CARE_PROVIDER_SITE_OTHER): Payer: Managed Care, Other (non HMO) | Admitting: Family

## 2018-08-27 ENCOUNTER — Encounter (INDEPENDENT_AMBULATORY_CARE_PROVIDER_SITE_OTHER): Payer: Self-pay | Admitting: Family

## 2018-08-27 VITALS — Ht 74.0 in | Wt 199.0 lb

## 2018-08-27 DIAGNOSIS — Z89431 Acquired absence of right foot: Secondary | ICD-10-CM

## 2018-08-27 MED ORDER — CLINDAMYCIN HCL 300 MG PO CAPS: 300.0000 mg | ORAL_CAPSULE | Freq: Three times a day (TID) | ORAL | 0 refills | Status: DC

## 2018-08-27 NOTE — Progress Notes (Signed)
Office Visit Note   Patient: Curtis Bradford           Date of Birth: 12/31/1960           MRN: [id number] Visit Date: 08/27/2018              Requested by: No referring provider defined for this encounter. PCP: Patient, No Pcp Per  Chief Complaint  Patient presents with  . Right Foot - Routine Post Op    07/23/2018 right transmet amputation       HPI: The patient is a 58 year old gentleman who presents for follow-up of a right transmetatarsal amputation on 07/23/2018.  He has been trying to maintain nonweightbearing and utilizing a knee scooter.  He reports continued bloody drainage at times on his dressings and some odor. States is taking Bactrim DS. Has concern for ongoing infection, states his foot has become erythematous and swollen.   Assessment & Plan: Visit Diagnoses:  1. S/P transmetatarsal amputation of foot, right (HCC)     Plan: Continue Septra DS 1 p.o. twice daily, will call in Clindamycin as well, Trental 400 mg p.o. 3 times daily and nitroglycerin patches daily alternating the site on the foot. Have sent a wound culture today. Instructed the patient on the necessity of strict nonweightbearing and utilizing the scooter is much as possible. Will call with wound culture results. He will follow-up in 2 weeks or sooner should he have difficulties in the interim.  Follow-Up Instructions: No follow-ups on file.   Ortho Exam  [name] alert, oriented, no adenopathy, well-dressed, normal affect, normal respiratory effort. The right transmetatarsal amputation site does continue to have edema and there is dehiscence centrally. Erythema over dorsum of foot, with tenderness and edema. No ascending cellulitis.  There does not appear to be any tracking to bone or visible tendons. No purulence.  Imaging: No results found.   Labs: Lab Results  Component Value Date   HGBA1C 8.3 (H) 07/23/2018   HGBA1C 7.4 (H) 04/23/2018   HGBA1C 6.5 (A) 01/31/2018   ESRSEDRATE 31 (H)  02/14/2017   CRP 12.3 (H) 02/14/2017   REPTSTATUS 02/20/2017 FINAL 02/14/2017   REPTSTATUS 02/20/2017 FINAL 02/14/2017   CULT  02/14/2017    NO GROWTH 5 DAYS Performed at [facility], [address], [place], [place] [address]    CULT  02/14/2017    NO GROWTH 5 DAYS Performed at [facility], [address], [place], [place] [address]      Lab Results  Component Value Date   ALBUMIN 4.1 01/31/2018   ALBUMIN 3.7 10/30/2017   ALBUMIN 3.0 (L) 02/15/2017   PREALBUMIN 13.8 (L) 02/15/2017    Body mass index is 25.55 kg/m.  Orders:  Orders Placed This Encounter  Procedures  . WOUND CULTURE   Meds ordered this encounter  Medications  . clindamycin (CLEOCIN) 300 MG capsule    Sig: Take 1 capsule (300 mg total) by mouth 3 (three) times daily.    Dispense:  40 capsule    Refill:  0     Procedures: No procedures performed  Clinical Data: No additional findings.  ROS:  All other systems negative, except as noted in the HPI. Review of Systems  Constitutional: Negative for chills and fever.  Skin: Positive for wound. Negative for color change.    Objective: Vital Signs: Ht 6\' 2"  (1.88 m)   Wt 199 lb (90.3 kg)   BMI 25.55 kg/m   Specialty Comments:  No  specialty comments available.  PMFS History: Patient Active Problem List   Diagnosis Date Noted  . Status post transmetatarsal amputation of foot, right (HCC) 07/23/2018  . Osteomyelitis (HCC) 07/23/2018  . TIA (transient ischemic attack) 10/30/2017  . Medication monitoring encounter 03/28/2017  . Status post amputation of toe of right foot (HCC) 03/05/2017  . Subacute osteomyelitis, right ankle and foot (HCC)   . Peripheral neuropathy 02/16/2017  . Peripheral artery disease (HCC) 02/16/2017  . 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 Diabetic foot infection (HCC) 02/14/2017  . Type II diabetes mellitus with renal manifestations (HCC)   . HLD (hyperlipidemia)   .  Tobacco abuse   . CKD stage 2 due to type 2 diabetes mellitus (HCC)   . CAD (coronary artery disease)   . Seizure disorder [facility])    Past Medical History:  Diagnosis Date  . CAD (coronary artery disease)   . CKD (chronic kidney disease), stage III (HCC)   . 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 History of DVT (deep vein thrombosis)   . History of kidney stones   . HLD (hyperlipidemia)   . Myocardial infarction (HCC) 2009  . Peripheral vascular disease (HCC)   . Seizure (HCC)    last one 2015  . Stroke [facility]) 2012, 10/2017   2012-speech was effected- has come back.10/2017- numbness of right side no taste right  side. 2012-  . Tobacco abuse     Family History  Problem Relation Age of Onset  . Diabetes Mellitus II Mother   . Diabetes Mellitus II Father     Past Surgical History:  Procedure Laterality Date  . AMPUTATION Right 02/23/2017   Procedure: RIGHT FOOT 5TH RAY AMPUTATION;  Surgeon: [name], MD;  Location: [facility] OR;  Service: Orthopedics;  Laterality: Right;  . AMPUTATION Right 04/23/2018   Procedure: Right Great Toe Amputation;  Surgeon: [name], MD;  Location: [facility] OR;  Service: Orthopedics;  Laterality: Right;  . AMPUTATION Right 07/23/2018   Procedure: RIGHT TRANSMETATARSAL AMPUTATION, APPLY WOUND VAC;  Surgeon: [name], MD;  Location: [facility] OR;  Service: Orthopedics;  Laterality: Right;  . CORONARY ANGIOPLASTY  03/05/2008   stent  . FOOT SURGERY    . PERIPHERAL ARTERIAL STENT GRAFT Right    leg 2 stents  . STENT PLACEMENT VASCULAR ([facility] HX)     Several in right leg  . TONSILLECTOMY    . TRANSMETATARSAL AMPUTATION Right 07/23/2018   Social History   Occupational History  . Not on file  Tobacco Use  . Smoking status: Light Tobacco Smoker    Years: 15.00    Types: Cigarettes  . Smokeless tobacco: Never Used  . Tobacco comment: less than a pack a week  Substance and Sexual Activity  . Alcohol use: No    Comment: seldom  . Drug  use: No  . Sexual activity: Not on file

## 2018-08-28 ENCOUNTER — Telehealth (INDEPENDENT_AMBULATORY_CARE_PROVIDER_SITE_OTHER): Payer: Self-pay

## 2018-08-28 NOTE — Telephone Encounter (Signed)
I received a voicemail from [employer] requesting to know pts last ov, next ov, dx, and cpt code for surgery. This appears to be [employer] and not a work [employer] technical [employer]. Not sure who this needs to go to so we can make sure we have a release on file to be able to give them this info.

## 2018-08-28 NOTE — Telephone Encounter (Signed)
They faxed a disability form that asks the same. It will completed and returned following protocol

## 2018-08-31 LAB — WOUND CULTURE
MICRO NUMBER:: 276152
SPECIMEN QUALITY:: ADEQUATE

## 2018-09-01 ENCOUNTER — Telehealth (INDEPENDENT_AMBULATORY_CARE_PROVIDER_SITE_OTHER): Payer: Self-pay | Admitting: Family

## 2018-09-01 NOTE — Telephone Encounter (Signed)
Patient called asked for a call back concerning the test results he had done 08/27/2018. The number to contact patient is [phone]

## 2018-09-01 NOTE — Telephone Encounter (Signed)
Cultures were positive for normal skin bacteria.

## 2018-09-01 NOTE — Telephone Encounter (Signed)
Erin obtained a wound culture when the pt came into the office last week. Can you please review and advise what I should tell the pt.

## 2018-09-01 NOTE — Telephone Encounter (Signed)
I called pt and advised of message below. He states that he has been on Septra DS and clindamycin and that this has been working. He is afraid that the cultures are not correct as he is responding to the ABX and says that years ago he was advised that wound cultures were negative and days later was admitted for a foot infection. Pt has an appt on Thursday but asked that I send this message to you to address.

## 2018-09-04 ENCOUNTER — Encounter (INDEPENDENT_AMBULATORY_CARE_PROVIDER_SITE_OTHER): Payer: Self-pay | Admitting: Physician Assistant

## 2018-09-04 ENCOUNTER — Other Ambulatory Visit: Payer: Self-pay

## 2018-09-04 ENCOUNTER — Ambulatory Visit (INDEPENDENT_AMBULATORY_CARE_PROVIDER_SITE_OTHER): Payer: Managed Care, Other (non HMO) | Admitting: Physician Assistant

## 2018-09-04 ENCOUNTER — Ambulatory Visit (INDEPENDENT_AMBULATORY_CARE_PROVIDER_SITE_OTHER): Payer: Managed Care, Other (non HMO)

## 2018-09-04 VITALS — Ht 74.0 in | Wt 199.0 lb

## 2018-09-04 DIAGNOSIS — E1122 Type 2 diabetes mellitus with diabetic chronic kidney disease: Secondary | ICD-10-CM

## 2018-09-04 DIAGNOSIS — E1142 Type 2 diabetes mellitus with diabetic polyneuropathy: Secondary | ICD-10-CM

## 2018-09-04 DIAGNOSIS — N182 Chronic kidney disease, stage 2 (mild): Secondary | ICD-10-CM

## 2018-09-04 DIAGNOSIS — Z794 Long term (current) use of insulin: Secondary | ICD-10-CM

## 2018-09-04 DIAGNOSIS — Z89431 Acquired absence of right foot: Secondary | ICD-10-CM

## 2018-09-05 ENCOUNTER — Encounter (INDEPENDENT_AMBULATORY_CARE_PROVIDER_SITE_OTHER): Payer: Self-pay | Admitting: Physician Assistant

## 2018-09-05 NOTE — Progress Notes (Signed)
Office Visit Note   Patient: Curtis Bradford           Date of Birth: 08/23/1960           MRN: [id number] Visit Date: 09/04/2018              Requested by: No referring provider defined for this encounter. PCP: Patient, No Pcp Per  Chief Complaint  Patient presents with  . Right Foot - Routine Post Op    07/23/2018 right transmet amputation       HPI: [name] a 58 year old gentleman who is seen for postoperative follow-up following a right transmetatarsal amputation on 07/23/2018.  He has been utilizing a knee scooter and trying to maintain nonweightbearing as much as possible.  He had been on Bactrim DS and clindamycin was also added in last week as he developed continued and worsening drainage.  He reports that the drainage has decreased by about 50% but he is having more pain over the area and having more ankle swelling each time he applies a nitroglycerin patch.  He did take the nitroglycerin patch off as he felt like it worsened his edema last night and did not resume it today.  He has been taking Trental 400 mg 3 times daily.  Assessment & Plan: Visit Diagnoses:  1. S/P transmetatarsal amputation of foot, right ([place])   2. Type 2 diabetes mellitus with diabetic polyneuropathy, with long-term current use of insulin ([place])   3. CKD stage 2 due to type 2 diabetes mellitus ([place])     Plan: Patient would like to attempt all efforts at trying to preserve his residual foot.  He is going to continue and complete his courses of Bactrim DS and clindamycin.  He is going to try the nitroglycerin patches again and continue Trental 400 mg 3 times daily.  He should continue offloading and elevation as much as possible.  He will follow-up with Dr. Sharol Given next week for reevaluation.  Follow-Up Instructions: Return in about 1 week (around 09/11/2018).   Ortho Exam  [name] alert, oriented, no adenopathy, well-dressed, normal affect, normal respiratory effort. The distal trans-met tarsal  amputation site continues to have widening over the incisional area and there is a central area which has fat layer exposed but which does appear to track to the bone.  There is no clearly visible bone.  There is moderate serosanguineous drainage with odor.  There is mild erythema over the dorsal foot and he is tender to probing and palpation.  He does have a palpable dorsalis pedis pulse.  There is edema of the residual foot.    Imaging: Xr Foot Complete Right  Result Date: 09/05/2018 Radiographs of the right foot show the patient to be status post transmetatarsal amputation without evidence of osteomyelitis over the residual metatarsals or elsewhere.  No other osseous abnormalities are noted.  No images are attached to the encounter.  Labs: Lab Results  Component Value Date   HGBA1C 8.3 (H) 07/23/2018   HGBA1C 7.4 (H) 04/23/2018   HGBA1C 6.5 (A) 01/31/2018   ESRSEDRATE 31 (H) 02/14/2017   CRP 12.3 (H) 02/14/2017   REPTSTATUS 02/20/2017 FINAL 02/14/2017   REPTSTATUS 02/20/2017 FINAL 02/14/2017   CULT  02/14/2017    NO GROWTH 5 DAYS Performed at [facility], [place] [address] [place], [place] [address]    CULT  02/14/2017    NO GROWTH 5 DAYS Performed at [place] [address], [place], [place] [address]  Lab Results  Component Value Date   ALBUMIN 4.1 01/31/2018   ALBUMIN 3.7 10/30/2017   ALBUMIN 3.0 (L) 02/15/2017   PREALBUMIN 13.8 (L) 02/15/2017    Body mass index is 25.55 kg/m.  Orders:  Orders Placed This Encounter  Procedures  . XR Foot Complete Right   No orders of the defined types were placed in this encounter.    Procedures: No procedures performed  Clinical Data: No additional findings.  ROS:  All other systems negative, except as noted in the HPI. Review of Systems  Objective: Vital Signs: Ht 6' 2"  (1.88 m)   Wt 199 lb (90.3 kg)   BMI 25.55 kg/m   Specialty Comments:  No specialty comments available.  PMFS  History: Patient Active Problem List   Diagnosis Date Noted  . Status post transmetatarsal amputation of foot, right ([place]) 07/23/2018  . Osteomyelitis ([place]) 07/23/2018  . TIA (transient ischemic attack) 10/30/2017  . Medication monitoring encounter 03/28/2017  . Status post amputation of toe of right foot ([place]) 03/05/2017  . Subacute osteomyelitis, right ankle and foot ([place])   . Peripheral neuropathy 02/16/2017  . Peripheral artery disease ([place]) 02/16/2017  . Type 2 diabetes mellitus with diabetic foot ulcer ([place]) 02/14/2017  . Diabetic foot infection ([place]) 02/14/2017  . Type II diabetes mellitus with renal manifestations ([place])   . HLD (hyperlipidemia)   . Tobacco abuse   . CKD stage 2 due to type 2 diabetes mellitus ([place])   . CAD (coronary artery disease)   . Seizure disorder [facility])    Past Medical History:  Diagnosis Date  . CAD (coronary artery disease)   . CKD (chronic kidney disease), stage III ([place])   . Diabetes mellitus without complication ([place])    Type II  . History of DVT (deep vein thrombosis)   . History of kidney stones   . HLD (hyperlipidemia)   . Myocardial infarction ([place]) 2009  . Peripheral vascular disease ([place])   . Seizure ([place])    last one 2015  . Stroke [facility]) 2012, 10/2017   2012-speech was effected- has come back.10/2017- numbness of right side no taste right  side. 2012-  . Tobacco abuse     Family History  Problem Relation Age of Onset  . Diabetes Mellitus II Mother   . Diabetes Mellitus II Father     Past Surgical History:  Procedure Laterality Date  . AMPUTATION Right 02/23/2017   Procedure: RIGHT FOOT 5TH RAY AMPUTATION;  Surgeon: Newt Minion, MD;  Location: [place];  Service: Orthopedics;  Laterality: Right;  . AMPUTATION Right 04/23/2018   Procedure: Right Great Toe Amputation;  Surgeon: Newt Minion, MD;  Location: [place];  Service: Orthopedics;  Laterality: Right;  . AMPUTATION Right 07/23/2018   Procedure: RIGHT TRANSMETATARSAL AMPUTATION,  APPLY WOUND VAC;  Surgeon: Newt Minion, MD;  Location: [place];  Service: Orthopedics;  Laterality: Right;  . CORONARY ANGIOPLASTY  03/05/2008   stent  . FOOT SURGERY    . PERIPHERAL ARTERIAL STENT GRAFT Right    leg 2 stents  . STENT PLACEMENT VASCULAR ([place] HX)     Several in right leg  . TONSILLECTOMY    . TRANSMETATARSAL AMPUTATION Right 07/23/2018   Social History   Occupational History  . Not on file  Tobacco Use  . Smoking status: Light Tobacco Smoker    Years: 15.00    Types: Cigarettes  . Smokeless tobacco: Never Used  . Tobacco comment: less than a pack  a week  Substance and Sexual Activity  . Alcohol use: No    Comment: seldom  . Drug use: No  . Sexual activity: Not on file

## 2018-09-11 ENCOUNTER — Other Ambulatory Visit: Payer: Self-pay

## 2018-09-11 ENCOUNTER — Encounter (INDEPENDENT_AMBULATORY_CARE_PROVIDER_SITE_OTHER): Payer: Self-pay | Admitting: Orthopedic Surgery

## 2018-09-11 ENCOUNTER — Ambulatory Visit (INDEPENDENT_AMBULATORY_CARE_PROVIDER_SITE_OTHER): Payer: Managed Care, Other (non HMO) | Admitting: Orthopedic Surgery

## 2018-09-11 VITALS — Ht 74.0 in | Wt 199.0 lb

## 2018-09-11 DIAGNOSIS — E1142 Type 2 diabetes mellitus with diabetic polyneuropathy: Secondary | ICD-10-CM

## 2018-09-11 DIAGNOSIS — Z89431 Acquired absence of right foot: Secondary | ICD-10-CM

## 2018-09-11 DIAGNOSIS — Z794 Long term (current) use of insulin: Secondary | ICD-10-CM

## 2018-09-11 DIAGNOSIS — T8781 Dehiscence of amputation stump: Secondary | ICD-10-CM

## 2018-09-11 NOTE — Progress Notes (Signed)
Office Visit Note   Patient: Curtis Bradford           Date of Birth: [birth year]           MRN: [id number] Visit Date: 09/11/2018              Requested by: No referring provider defined for this encounter. PCP: Patient, No Pcp Per  Chief Complaint  Patient presents with  . Right Foot - Follow-up      HPI: [name] a 58 year old gentleman who presents in follow-up for foot salvage intervention with a transmetatarsal amputation on the right.  [name] undergone serial debridement he has been using oral antibiotics and has also been prescribed Trental to help with the microcirculation.  Patient has also undergone 3 stent grafts from the right lower extremity to improve his circulation.  Assessment & Plan: Visit Diagnoses:  1. S/P transmetatarsal amputation of foot, right (HCC)   2. Type 2 diabetes mellitus with diabetic polyneuropathy, with long-term current use of insulin (HCC)   3. Dehiscence of amputation stump (HCC)     Plan: With patient's progressive wound dehiscence exposed bone and necrotic tissue he no longer has foot salvage options available.  Discussed proceeding with a transtibial amputation discussed that he would either need to be an inpatient rehab or outpatient rehab after surgery.  Discussed that we would evaluate this after surgery.  Patient states he would like to proceed with surgery on Wednesday.  Risks and benefits were discussed patient states he understands.  Discussed that as patient progresses he could potentially be disabled or he may be able to continue his current work.  Follow-Up Instructions: Return in about 2 weeks (around 09/25/2018).   Ortho Exam  [name] alert, oriented, no adenopathy, well-dressed, normal affect, normal respiratory effort. Examination patient has a strong dorsalis pedis pulse.  No clinical signs of occlusion of his previous stent surgeries.  Patient has progressive dehiscence of the surgical wound there is a necrotic base to  the wound that is approximately 3 cm in diameter and this extends 3 cm deep and extends down to the metatarsals.  There is no abscess there is no ascending cellulitis.  Imaging: No results found. No images are attached to the encounter.  Labs: Lab Results  Component Value Date   HGBA1C 8.3 (H) 07/23/2018   HGBA1C 7.4 (H) 04/23/2018   HGBA1C 6.5 (A) 01/31/2018   ESRSEDRATE 31 (H) 02/14/2017   CRP 12.3 (H) 02/14/2017   REPTSTATUS 02/20/2017 FINAL 02/14/2017   REPTSTATUS 02/20/2017 FINAL 02/14/2017   CULT  02/14/2017    NO GROWTH 5 DAYS Performed at [facility], [address], [place], [place] [address]    CULT  02/14/2017    NO GROWTH 5 DAYS Performed at [facility], [address] [place], [place] [address]      Lab Results  Component Value Date   ALBUMIN 4.1 01/31/2018   ALBUMIN 3.7 10/30/2017   ALBUMIN 3.0 (L) 02/15/2017   PREALBUMIN 13.8 (L) 02/15/2017    Body mass index is 25.55 kg/m.  Orders:  No orders of the defined types were placed in this encounter.  No orders of the defined types were placed in this encounter.    Procedures: No procedures performed  Clinical Data: No additional findings.  ROS:  All other systems negative, except as noted in the HPI. Review of Systems  Objective: Vital Signs: Ht 6\' 2"  (1.88 m)   Wt 199 lb (90.3  kg)   BMI 25.55 kg/m   Specialty Comments:  No specialty comments available.  PMFS History: Patient Active Problem List   Diagnosis Date Noted  . Status post transmetatarsal amputation of foot, right (HCC) 07/23/2018  . Osteomyelitis (HCC) 07/23/2018  . TIA (transient ischemic attack) 10/30/2017  . Medication monitoring encounter 03/28/2017  . Status post amputation of toe of right foot (HCC) 03/05/2017  . Subacute osteomyelitis, right ankle and foot (HCC)   . Peripheral neuropathy 02/16/2017  . Peripheral artery disease (HCC) 02/16/2017  . Type 2 diabetes mellitus with diabetic foot ulcer  (HCC) 02/14/2017  . Diabetic foot infection (HCC) 02/14/2017  . Type II diabetes mellitus with renal manifestations (HCC)   . HLD (hyperlipidemia)   . Tobacco abuse   . CKD stage 2 due to type 2 diabetes mellitus (HCC)   . CAD (coronary artery disease)   . Seizure disorder [facility])    Past Medical History:  Diagnosis Date  . CAD (coronary artery disease)   . CKD (chronic kidney disease), stage III (HCC)   . 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 History of DVT (deep vein thrombosis)   . History of kidney stones   . HLD (hyperlipidemia)   . Myocardial infarction (HCC) 2009  . Peripheral vascular disease (HCC)   . Seizure (HCC)    last one 2015  . Stroke [facility]) 2012, 10/2017   2012-speech was effected- has come back.10/2017- numbness of right side no taste right  side. 2012-  . Tobacco abuse     Family History  Problem Relation Age of Onset  . Diabetes Mellitus II Mother   . Diabetes Mellitus II Father     Past Surgical History:  Procedure Laterality Date  . AMPUTATION Right 02/23/2017   Procedure: RIGHT FOOT 5TH RAY AMPUTATION;  Surgeon: [name], MD;  Location[facility] OR;  Service: Orthopedics;  Laterality: Right;  . AMPUTATION Right 04/23/2018   Procedure: Right Great Toe Amputation;  Surgeon: [name], MD;  Location: [facility] OR;  Service: Orthopedics;  Laterality: Right;  . AMPUTATION Right 07/23/2018   Procedure: RIGHT TRANSMETATARSAL AMPUTATION, APPLY WOUND VAC;  Surgeon: [name], MD;  Location: [facility] OR;  Service: Orthopedics;  Laterality: Right;  . CORONARY ANGIOPLASTY  03/05/2008   stent  . FOOT SURGERY    . PERIPHERAL ARTERIAL STENT GRAFT Right    leg 2 stents  . STENT PLACEMENT VASCULAR ([facility] HX)     Several in right leg  . TONSILLECTOMY    . TRANSMETATARSAL AMPUTATION Right 07/23/2018   Social History   Occupational History  . Not on file  Tobacco Use  . Smoking status: Light Tobacco Smoker    Years: 15.00    Types: Cigarettes  .  Smokeless tobacco: Never Used  . Tobacco comment: less than a pack a week  Substance and Sexual Activity  . Alcohol use: No    Comment: seldom  . Drug use: No  . Sexual activity: Not on file

## 2018-09-15 ENCOUNTER — Ambulatory Visit (INDEPENDENT_AMBULATORY_CARE_PROVIDER_SITE_OTHER): Payer: Self-pay | Admitting: Physician Assistant

## 2018-09-16 ENCOUNTER — Encounter (HOSPITAL_COMMUNITY): Payer: Self-pay | Admitting: *Deleted

## 2018-09-16 ENCOUNTER — Other Ambulatory Visit: Payer: Self-pay

## 2018-09-16 NOTE — Progress Notes (Signed)
Spoke with patient regarding his pre-op instructions.  Patient denies SOB, chest pain, fever or cough.Marland Kitchen  PCP - Dr Lucianne Muss, [facility] Cardiologist - None  Chest x-ray - n/a EKG - 10/2017 Stress Test - n/a ECHO - 10/2017 Cardiac Cath - 02/2008  Sleep Study - n/a CPAP - none  Fasting Blood Sugar - 110-130s [name] a type 2 diabetic, instructed not to take his oral tradjenta on day of surgery.  Patient states no fasting blood sugars below 70. Instructed patient if blood sugar greater than 70, to take 50% of lantus dose which is 18 units on morning of DOS.  Last A1C was 8.3 on 07/23/2018.  Blood Thinner Instructions: plavix  Dr Lajoyce Corners did not have patient stop plavix prior to surgery.  Last dose 3/24. Aspirin Instructions:Dr Lajoyce Corners did not have patient stop aspirin prior to surgery.  Last dose 3/24.  Patient instructed to stop all vitamins, supplements, Ibuprofen/NSAIDS, Aleve, Naproxen, Motrin, Advil, Goody's, BC's, fish oil, etc.    Patient verbalizes understanding of all pre-op instructions.

## 2018-09-16 NOTE — Progress Notes (Signed)
Patient informed of the hospital visitor restriction, no one allowed in hospital but the patient.  Patient verbalized understanding of the visitor restriction.  Patient to be dropped off and picked up upon d/c from hospital.

## 2018-09-17 ENCOUNTER — Inpatient Hospital Stay (HOSPITAL_COMMUNITY): Payer: Managed Care, Other (non HMO) | Admitting: Certified Registered"

## 2018-09-17 ENCOUNTER — Encounter (HOSPITAL_COMMUNITY): Payer: Self-pay | Admitting: General Practice

## 2018-09-17 ENCOUNTER — Encounter (HOSPITAL_COMMUNITY)
Admission: RE | Disposition: A | Discharge status: Rehabilitation Facility | Payer: Self-pay | Source: Home / Self Care | Attending: Orthopedic Surgery

## 2018-09-17 ENCOUNTER — Inpatient Hospital Stay (HOSPITAL_COMMUNITY)
Admission: RE | Admit: 2018-09-17 | Discharge: 2018-09-19 | DRG: 475 | Disposition: A | Discharge status: Rehabilitation Facility | Payer: Managed Care, Other (non HMO) | Attending: Orthopedic Surgery | Admitting: Orthopedic Surgery

## 2018-09-17 ENCOUNTER — Other Ambulatory Visit: Payer: Self-pay

## 2018-09-17 DIAGNOSIS — I252 Old myocardial infarction: Secondary | ICD-10-CM

## 2018-09-17 DIAGNOSIS — N183 Chronic kidney disease, stage 3 (moderate): Secondary | ICD-10-CM | POA: Diagnosis present

## 2018-09-17 DIAGNOSIS — G546 Phantom limb syndrome with pain: Secondary | ICD-10-CM | POA: Diagnosis not present

## 2018-09-17 DIAGNOSIS — I739 Peripheral vascular disease, unspecified: Secondary | ICD-10-CM | POA: Diagnosis not present

## 2018-09-17 DIAGNOSIS — R569 Unspecified convulsions: Secondary | ICD-10-CM | POA: Diagnosis present

## 2018-09-17 DIAGNOSIS — E1152 Type 2 diabetes mellitus with diabetic peripheral angiopathy with gangrene: Secondary | ICD-10-CM | POA: Diagnosis present

## 2018-09-17 DIAGNOSIS — Y835 Amputation of limb(s) as the cause of abnormal reaction of the patient, or of later complication, without mention of misadventure at the time of the procedure: Secondary | ICD-10-CM | POA: Diagnosis present

## 2018-09-17 DIAGNOSIS — G8918 Other acute postprocedural pain: Secondary | ICD-10-CM | POA: Diagnosis not present

## 2018-09-17 DIAGNOSIS — Z794 Long term (current) use of insulin: Secondary | ICD-10-CM | POA: Diagnosis not present

## 2018-09-17 DIAGNOSIS — R7309 Other abnormal glucose: Secondary | ICD-10-CM | POA: Diagnosis not present

## 2018-09-17 DIAGNOSIS — Z8673 Personal history of transient ischemic attack (TIA), and cerebral infarction without residual deficits: Secondary | ICD-10-CM

## 2018-09-17 DIAGNOSIS — Z86718 Personal history of other venous thrombosis and embolism: Secondary | ICD-10-CM | POA: Diagnosis not present

## 2018-09-17 DIAGNOSIS — Z4781 Encounter for orthopedic aftercare following surgical amputation: Secondary | ICD-10-CM | POA: Diagnosis not present

## 2018-09-17 DIAGNOSIS — T8781 Dehiscence of amputation stump: Secondary | ICD-10-CM | POA: Diagnosis present

## 2018-09-17 DIAGNOSIS — Z7982 Long term (current) use of aspirin: Secondary | ICD-10-CM | POA: Diagnosis not present

## 2018-09-17 DIAGNOSIS — M792 Neuralgia and neuritis, unspecified: Secondary | ICD-10-CM | POA: Diagnosis not present

## 2018-09-17 DIAGNOSIS — E785 Hyperlipidemia, unspecified: Secondary | ICD-10-CM | POA: Diagnosis present

## 2018-09-17 DIAGNOSIS — D62 Acute posthemorrhagic anemia: Secondary | ICD-10-CM | POA: Diagnosis not present

## 2018-09-17 DIAGNOSIS — E1122 Type 2 diabetes mellitus with diabetic chronic kidney disease: Secondary | ICD-10-CM | POA: Diagnosis present

## 2018-09-17 DIAGNOSIS — Z7902 Long term (current) use of antithrombotics/antiplatelets: Secondary | ICD-10-CM | POA: Diagnosis not present

## 2018-09-17 DIAGNOSIS — I96 Gangrene, not elsewhere classified: Secondary | ICD-10-CM | POA: Diagnosis not present

## 2018-09-17 DIAGNOSIS — E1142 Type 2 diabetes mellitus with diabetic polyneuropathy: Secondary | ICD-10-CM | POA: Diagnosis present

## 2018-09-17 DIAGNOSIS — S88119A Complete traumatic amputation at level between knee and ankle, unspecified lower leg, initial encounter: Secondary | ICD-10-CM | POA: Diagnosis present

## 2018-09-17 DIAGNOSIS — Z833 Family history of diabetes mellitus: Secondary | ICD-10-CM

## 2018-09-17 DIAGNOSIS — E119 Type 2 diabetes mellitus without complications: Secondary | ICD-10-CM | POA: Diagnosis not present

## 2018-09-17 DIAGNOSIS — R52 Pain, unspecified: Secondary | ICD-10-CM | POA: Diagnosis not present

## 2018-09-17 DIAGNOSIS — I2510 Atherosclerotic heart disease of native coronary artery without angina pectoris: Secondary | ICD-10-CM | POA: Diagnosis present

## 2018-09-17 DIAGNOSIS — F17210 Nicotine dependence, cigarettes, uncomplicated: Secondary | ICD-10-CM | POA: Diagnosis present

## 2018-09-17 DIAGNOSIS — Z89511 Acquired absence of right leg below knee: Secondary | ICD-10-CM | POA: Diagnosis not present

## 2018-09-17 DIAGNOSIS — E46 Unspecified protein-calorie malnutrition: Secondary | ICD-10-CM | POA: Diagnosis not present

## 2018-09-17 HISTORY — PX: AMPUTATION: SHX166

## 2018-09-17 HISTORY — PX: APPLICATION OF WOUND VAC: SHX5189

## 2018-09-17 HISTORY — PX: BELOW KNEE LEG AMPUTATION: SUR23

## 2018-09-17 LAB — BASIC METABOLIC PANEL
Anion gap: 10 (ref 5–15)
BUN: 21 mg/dL — ABNORMAL HIGH (ref 6–20)
CO2: 22 mmol/L (ref 22–32)
Calcium: 8.5 mg/dL — ABNORMAL LOW (ref 8.9–10.3)
Chloride: 104 mmol/L (ref 98–111)
Creatinine, Ser: 1.46 mg/dL — ABNORMAL HIGH (ref 0.61–1.24)
GFR calc non Af Amer: 53 mL/min — ABNORMAL LOW (ref >60)
Glucose, Bld: 105 mg/dL — ABNORMAL HIGH (ref 70–99)
Potassium: 4.1 mmol/L (ref 3.5–5.1)
Sodium: 136 mmol/L (ref 135–145)

## 2018-09-17 LAB — HEMOGLOBIN A1C
Hgb A1c MFr Bld: 7.5 % — ABNORMAL HIGH (ref 4.8–5.6)
MEAN PLASMA GLUCOSE: 168.55 mg/dL

## 2018-09-17 LAB — CBC
HCT: 45.7 % (ref 39.0–52.0)
Hemoglobin: 14.7 g/dL (ref 13.0–17.0)
MCH: 30.2 pg (ref 26.0–34.0)
MCHC: 32.2 g/dL (ref 30.0–36.0)
MCV: 94 fL (ref 80.0–100.0)
NRBC: 0 % (ref 0.0–0.2)
Platelets: 369 10*3/uL (ref 150–400)
RBC: 4.86 MIL/uL (ref 4.22–5.81)
RDW: 13.6 % (ref 11.5–15.5)
WBC: 14.2 10*3/uL — ABNORMAL HIGH (ref 4.0–10.5)

## 2018-09-17 LAB — GLUCOSE, CAPILLARY
GLUCOSE-CAPILLARY: 119 mg/dL — AB (ref 70–99)
Glucose-Capillary: 88 mg/dL (ref 70–99)
Glucose-Capillary: 97 mg/dL (ref 70–99)
Glucose-Capillary: 144 mg/dL — ABNORMAL HIGH (ref 70–99)
Glucose-Capillary: 136 mg/dL — ABNORMAL HIGH (ref 70–99)

## 2018-09-17 SURGERY — AMPUTATION BELOW KNEE
Anesthesia: General | Site: Leg Lower | Laterality: Right

## 2018-09-17 MED ORDER — DOCUSATE SODIUM 100 MG PO CAPS
100.0000 mg | ORAL_CAPSULE | Freq: Two times a day (BID) | ORAL | Status: DC
  Administered 2018-09-17 – 2018-09-19 (×4): 100 mg via ORAL
  Filled 2018-09-17 (×4): qty 1

## 2018-09-17 MED ORDER — CLONIDINE HCL (ANALGESIA) 100 MCG/ML EP SOLN
EPIDURAL | Status: DC | PRN
  Administered 2018-09-17 (×2): 100 ug

## 2018-09-17 MED ORDER — FENTANYL CITRATE (PF) 100 MCG/2ML IJ SOLN
INTRAMUSCULAR | Status: AC
  Administered 2018-09-17: 100 ug
  Filled 2018-09-17: qty 2

## 2018-09-17 MED ORDER — PROPOFOL 1000 MG/100ML IV EMUL
INTRAVENOUS | Status: AC
  Filled 2018-09-17: qty 100

## 2018-09-17 MED ORDER — ONDANSETRON HCL 4 MG/2ML IJ SOLN
INTRAMUSCULAR | Status: DC | PRN
  Administered 2018-09-17: 4 mg via INTRAVENOUS

## 2018-09-17 MED ORDER — MIDAZOLAM HCL 2 MG/2ML IJ SOLN
INTRAMUSCULAR | Status: AC
  Administered 2018-09-17: 2 mg
  Filled 2018-09-17: qty 2

## 2018-09-17 MED ORDER — BISACODYL 10 MG RE SUPP: 10.0000 mg | Freq: Every day | RECTAL | Status: DC | PRN

## 2018-09-17 MED ORDER — EZETIMIBE 10 MG PO TABS
10.0000 mg | ORAL_TABLET | Freq: Every day | ORAL | Status: DC
  Administered 2018-09-17 – 2018-09-18 (×2): 10 mg via ORAL
  Filled 2018-09-17 (×3): qty 1

## 2018-09-17 MED ORDER — ASPIRIN EC 81 MG PO TBEC
81.0000 mg | DELAYED_RELEASE_TABLET | Freq: Every day | ORAL | Status: DC
  Administered 2018-09-17 – 2018-09-19 (×3): 81 mg via ORAL
  Filled 2018-09-17 (×3): qty 1

## 2018-09-17 MED ORDER — CLOPIDOGREL BISULFATE 75 MG PO TABS
75.0000 mg | ORAL_TABLET | Freq: Every day | ORAL | Status: DC
  Administered 2018-09-17 – 2018-09-18 (×2): 75 mg via ORAL
  Filled 2018-09-17 (×2): qty 1

## 2018-09-17 MED ORDER — ATORVASTATIN CALCIUM 10 MG PO TABS
20.0000 mg | ORAL_TABLET | Freq: Every day | ORAL | Status: DC
  Administered 2018-09-17 – 2018-09-18 (×2): 20 mg via ORAL
  Filled 2018-09-17 (×3): qty 2

## 2018-09-17 MED ORDER — METOCLOPRAMIDE HCL 5 MG/ML IJ SOLN: 5.0000 mg | Freq: Three times a day (TID) | INTRAMUSCULAR | Status: DC | PRN

## 2018-09-17 MED ORDER — ARTIFICIAL TEARS OPHTHALMIC OINT
TOPICAL_OINTMENT | OPHTHALMIC | Status: AC
  Filled 2018-09-17: qty 3.5

## 2018-09-17 MED ORDER — HYDROMORPHONE HCL 1 MG/ML IJ SOLN
0.5000 mg | INTRAMUSCULAR | Status: DC | PRN
  Administered 2018-09-18 – 2018-09-19 (×5): 1 mg via INTRAVENOUS
  Filled 2018-09-17 (×5): qty 1

## 2018-09-17 MED ORDER — INSULIN ASPART 100 UNIT/ML ~~LOC~~ SOLN
0.0000 [IU] | Freq: Three times a day (TID) | SUBCUTANEOUS | Status: DC
  Administered 2018-09-17: 2 [IU] via SUBCUTANEOUS
  Administered 2018-09-18: 1 [IU] via SUBCUTANEOUS
  Administered 2018-09-19: 2 [IU] via SUBCUTANEOUS

## 2018-09-17 MED ORDER — ONDANSETRON HCL 4 MG/2ML IJ SOLN: 4.0000 mg | Freq: Four times a day (QID) | INTRAMUSCULAR | Status: DC | PRN

## 2018-09-17 MED ORDER — METHOCARBAMOL 500 MG PO TABS
500.0000 mg | ORAL_TABLET | Freq: Four times a day (QID) | ORAL | Status: DC | PRN
  Administered 2018-09-18 – 2018-09-19 (×3): 500 mg via ORAL
  Filled 2018-09-17 (×3): qty 1

## 2018-09-17 MED ORDER — ONDANSETRON HCL 4 MG PO TABS: 4.0000 mg | ORAL_TABLET | Freq: Four times a day (QID) | ORAL | Status: DC | PRN

## 2018-09-17 MED ORDER — 0.9 % SODIUM CHLORIDE (POUR BTL) OPTIME
TOPICAL | Status: DC | PRN
  Administered 2018-09-17: 1000 mL

## 2018-09-17 MED ORDER — LACTATED RINGERS IV SOLN
INTRAVENOUS | Status: DC
  Administered 2018-09-17: 08:00:00 via INTRAVENOUS

## 2018-09-17 MED ORDER — MIDAZOLAM HCL 2 MG/2ML IJ SOLN
INTRAMUSCULAR | Status: AC
  Filled 2018-09-17: qty 2

## 2018-09-17 MED ORDER — ACETAMINOPHEN 325 MG PO TABS
325.0000 mg | ORAL_TABLET | Freq: Four times a day (QID) | ORAL | Status: DC | PRN
  Administered 2018-09-18: 650 mg via ORAL
  Filled 2018-09-17: qty 2

## 2018-09-17 MED ORDER — LIDOCAINE 2% (20 MG/ML) 5 ML SYRINGE
INTRAMUSCULAR | Status: AC
  Filled 2018-09-17: qty 10

## 2018-09-17 MED ORDER — SODIUM CHLORIDE 0.9 % IV SOLN
INTRAVENOUS | Status: DC
  Administered 2018-09-17: 18:00:00 via INTRAVENOUS

## 2018-09-17 MED ORDER — FENTANYL CITRATE (PF) 250 MCG/5ML IJ SOLN
INTRAMUSCULAR | Status: AC
  Filled 2018-09-17: qty 5

## 2018-09-17 MED ORDER — PROPOFOL 10 MG/ML IV BOLUS
INTRAVENOUS | Status: AC
  Filled 2018-09-17: qty 20

## 2018-09-17 MED ORDER — OXYCODONE HCL 5 MG PO TABS
5.0000 mg | ORAL_TABLET | ORAL | Status: DC | PRN
  Administered 2018-09-18: 5 mg via ORAL
  Filled 2018-09-17: qty 1
  Filled 2018-09-17: qty 2

## 2018-09-17 MED ORDER — CEFAZOLIN SODIUM-DEXTROSE 2-4 GM/100ML-% IV SOLN
2.0000 g | INTRAVENOUS | Status: AC
  Administered 2018-09-17: 2 g via INTRAVENOUS
  Filled 2018-09-17: qty 100

## 2018-09-17 MED ORDER — ROPIVACAINE HCL 2 MG/ML IJ SOLN
INTRAMUSCULAR | Status: DC | PRN
  Administered 2018-09-17: 150 mg via PERINEURAL
  Administered 2018-09-17: 100 mg via PERINEURAL

## 2018-09-17 MED ORDER — MAGNESIUM CITRATE PO SOLN: 1.0000 | Freq: Once | ORAL | Status: DC | PRN

## 2018-09-17 MED ORDER — METOCLOPRAMIDE HCL 5 MG PO TABS: 5.0000 mg | ORAL_TABLET | Freq: Three times a day (TID) | ORAL | Status: DC | PRN

## 2018-09-17 MED ORDER — LEVETIRACETAM ER 500 MG PO TB24
500.0000 mg | ORAL_TABLET | Freq: Every day | ORAL | Status: DC
  Administered 2018-09-18 (×2): 500 mg via ORAL
  Filled 2018-09-17 (×4): qty 1

## 2018-09-17 MED ORDER — FENTANYL CITRATE (PF) 100 MCG/2ML IJ SOLN: 100.0000 ug | Freq: Once | INTRAMUSCULAR | Status: AC

## 2018-09-17 MED ORDER — PROPOFOL 10 MG/ML IV BOLUS
INTRAVENOUS | Status: DC | PRN
  Administered 2018-09-17: 200 mg via INTRAVENOUS

## 2018-09-17 MED ORDER — POLYETHYLENE GLYCOL 3350 17 G PO PACK: 17.0000 g | PACK | Freq: Every day | ORAL | Status: DC | PRN

## 2018-09-17 MED ORDER — INSULIN ASPART 100 UNIT/ML ~~LOC~~ SOLN
4.0000 [IU] | Freq: Three times a day (TID) | SUBCUTANEOUS | Status: DC
  Administered 2018-09-17: 4 [IU] via SUBCUTANEOUS

## 2018-09-17 MED ORDER — GLYCOPYRROLATE 0.2 MG/ML IJ SOLN
INTRAMUSCULAR | Status: DC | PRN
  Administered 2018-09-17: 0.2 mg via INTRAVENOUS

## 2018-09-17 MED ORDER — LINAGLIPTIN 5 MG PO TABS
5.0000 mg | ORAL_TABLET | Freq: Every day | ORAL | Status: DC
  Administered 2018-09-18 – 2018-09-19 (×2): 5 mg via ORAL
  Filled 2018-09-17 (×2): qty 1

## 2018-09-17 MED ORDER — CHLORHEXIDINE GLUCONATE 4 % EX LIQD: 60.0000 mL | Freq: Once | CUTANEOUS | Status: DC

## 2018-09-17 MED ORDER — METHOCARBAMOL 1000 MG/10ML IJ SOLN
500.0000 mg | Freq: Four times a day (QID) | INTRAVENOUS | Status: DC | PRN
  Filled 2018-09-17: qty 5

## 2018-09-17 MED ORDER — SODIUM CHLORIDE 0.9 % IV SOLN
INTRAVENOUS | Status: DC | PRN
  Administered 2018-09-17: 30 ug/min via INTRAVENOUS

## 2018-09-17 MED ORDER — GLYCOPYRROLATE PF 0.2 MG/ML IJ SOSY
PREFILLED_SYRINGE | INTRAMUSCULAR | Status: AC
  Filled 2018-09-17: qty 1

## 2018-09-17 MED ORDER — CEFAZOLIN SODIUM-DEXTROSE 1-4 GM/50ML-% IV SOLN
1.0000 g | Freq: Four times a day (QID) | INTRAVENOUS | Status: AC
  Administered 2018-09-17 – 2018-09-18 (×3): 1 g via INTRAVENOUS
  Filled 2018-09-17 (×4): qty 50

## 2018-09-17 MED ORDER — INSULIN GLARGINE 100 UNIT/ML ~~LOC~~ SOLN
30.0000 [IU] | Freq: Every day | SUBCUTANEOUS | Status: DC
  Administered 2018-09-17 – 2018-09-18 (×2): 30 [IU] via SUBCUTANEOUS
  Filled 2018-09-17 (×3): qty 0.3

## 2018-09-17 MED ORDER — HYDROCODONE-ACETAMINOPHEN 5-325 MG PO TABS: 1.0000 | ORAL_TABLET | Freq: Four times a day (QID) | ORAL | Status: DC | PRN

## 2018-09-17 MED ORDER — FENTANYL CITRATE (PF) 250 MCG/5ML IJ SOLN
INTRAMUSCULAR | Status: DC | PRN
  Administered 2018-09-17: 50 ug via INTRAVENOUS

## 2018-09-17 MED ORDER — GABAPENTIN 300 MG PO CAPS
300.0000 mg | ORAL_CAPSULE | Freq: Three times a day (TID) | ORAL | Status: DC
  Administered 2018-09-17 – 2018-09-19 (×5): 300 mg via ORAL
  Filled 2018-09-17 (×5): qty 1

## 2018-09-17 MED ORDER — OXYCODONE HCL 5 MG PO TABS
10.0000 mg | ORAL_TABLET | ORAL | Status: DC | PRN
  Administered 2018-09-18: 15 mg via ORAL
  Administered 2018-09-18: 10 mg via ORAL
  Administered 2018-09-18: 15 mg via ORAL
  Administered 2018-09-19 (×2): 10 mg via ORAL
  Filled 2018-09-17: qty 2
  Filled 2018-09-17 (×2): qty 3
  Filled 2018-09-17: qty 2

## 2018-09-17 MED ORDER — ONDANSETRON HCL 4 MG/2ML IJ SOLN
INTRAMUSCULAR | Status: AC
  Filled 2018-09-17: qty 4

## 2018-09-17 MED ORDER — MIDAZOLAM HCL 2 MG/2ML IJ SOLN: 2.0000 mg | Freq: Once | INTRAMUSCULAR | Status: AC

## 2018-09-17 SURGICAL SUPPLY — 41 items
APL SKNCLS STERI-STRIP NONHPOA (GAUZE/BANDAGES/DRESSINGS) ×1
BENZOIN TINCTURE PRP APPL 2/3 (GAUZE/BANDAGES/DRESSINGS) ×1 IMPLANT
BLADE SAW RECIP 87.9 MT (BLADE) ×2 IMPLANT
BLADE SURG 21 STRL SS (BLADE) ×2 IMPLANT
BNDG COHESIVE 4X5 TAN STRL (GAUZE/BANDAGES/DRESSINGS) ×1 IMPLANT
BNDG COHESIVE 6X5 TAN STRL LF (GAUZE/BANDAGES/DRESSINGS) ×2 IMPLANT
CANISTER WOUND CARE 500ML ATS (WOUND CARE) ×2 IMPLANT
CANISTER WOUNDNEG PRESSURE 500 (CANNISTER) ×1 IMPLANT
COVER SURGICAL LIGHT HANDLE (MISCELLANEOUS) ×2 IMPLANT
COVER WAND RF STERILE (DRAPES) ×2 IMPLANT
CUFF TOURNIQUET SINGLE 34IN LL (TOURNIQUET CUFF) ×2 IMPLANT
DRAPE INCISE IOBAN 66X45 STRL (DRAPES) IMPLANT
DRAPE U-SHAPE 47X51 STRL (DRAPES) ×2 IMPLANT
DRESSING PREVENA PLUS CUSTOM (GAUZE/BANDAGES/DRESSINGS) ×1 IMPLANT
DRSG PREVENA PLUS CUSTOM (GAUZE/BANDAGES/DRESSINGS) ×2
DURAPREP 26ML APPLICATOR (WOUND CARE) ×2 IMPLANT
ELECT REM PT RETURN 9FT ADLT (ELECTROSURGICAL) ×2
ELECTRODE REM PT RTRN 9FT ADLT (ELECTROSURGICAL) ×1 IMPLANT
GLOVE BIOGEL PI IND STRL 9 (GLOVE) ×1 IMPLANT
GLOVE BIOGEL PI INDICATOR 9 (GLOVE) ×1
GLOVE SURG ORTHO 9.0 STRL STRW (GLOVE) ×2 IMPLANT
GOWN STRL REUS W/ TWL XL LVL3 (GOWN DISPOSABLE) ×2 IMPLANT
GOWN STRL REUS W/TWL XL LVL3 (GOWN DISPOSABLE) ×4
KIT BASIN OR (CUSTOM PROCEDURE TRAY) ×2 IMPLANT
KIT DRSG PREVENA PLUS 7DAY 125 (MISCELLANEOUS) ×1 IMPLANT
KIT TURNOVER KIT B (KITS) ×2 IMPLANT
MANIFOLD NEPTUNE II (INSTRUMENTS) ×2 IMPLANT
NS IRRIG 1000ML POUR BTL (IV SOLUTION) ×2 IMPLANT
PACK ORTHO EXTREMITY (CUSTOM PROCEDURE TRAY) ×2 IMPLANT
PAD ARMBOARD 7.5X6 YLW CONV (MISCELLANEOUS) ×2 IMPLANT
PREVENA RESTOR ARTHOFORM 46X30 (CANNISTER) ×2 IMPLANT
SPONGE LAP 18X18 RF (DISPOSABLE) IMPLANT
STAPLER VISISTAT 35W (STAPLE) ×1 IMPLANT
STOCKINETTE IMPERVIOUS LG (DRAPES) ×2 IMPLANT
SUT ETHILON 2 0 PSLX (SUTURE) IMPLANT
SUT SILK 2 0 (SUTURE) ×2
SUT SILK 2-0 18XBRD TIE 12 (SUTURE) ×1 IMPLANT
SUT VIC AB 1 CTX 27 (SUTURE) ×4 IMPLANT
TOWEL OR 17X26 10 PK STRL BLUE (TOWEL DISPOSABLE) ×2 IMPLANT
TUBE CONNECTING 12X1/4 (SUCTIONS) ×2 IMPLANT
YANKAUER SUCT BULB TIP NO VENT (SUCTIONS) ×2 IMPLANT

## 2018-09-17 NOTE — Anesthesia Preprocedure Evaluation (Addendum)
Anesthesia Evaluation  Patient identified by MRN, date of birth, ID band Patient awake    Reviewed: Allergy & Precautions, NPO status , Patient's Chart, lab work & pertinent test results  Airway Mallampati: II  TM Distance: >3 FB Neck ROM: Full    Dental no notable dental hx. (+) Teeth Intact, Dental Advisory Given   Pulmonary Current Smoker,    Pulmonary exam normal breath sounds clear to auscultation       Cardiovascular + CAD and + Peripheral Vascular Disease  Normal cardiovascular exam Rhythm:Regular Rate:Normal  10/31/17 Echo Left ventricle: The cavity size was normal. Systolic function was   normal. The estimated ejection fraction was in the range of 55%   to 60%. Hypokinesis of the apical myocardium. Doppler parameters   are consistent with abnormal left ventricular relaxation (grade 1   diastolic dysfunction).   Neuro/Psych Seizures -,  TIA   GI/Hepatic negative GI ROS,   Endo/Other  diabetes, Type 1  Renal/GU Renal InsufficiencyRenal disease     Musculoskeletal   Abdominal   Peds  Hematology   Anesthesia Other Findings   Reproductive/Obstetrics                            Anesthesia Physical Anesthesia Plan  ASA: IV  Anesthesia Plan: General   Post-op Pain Management:  Regional for Post-op pain   Induction: Intravenous  PONV Risk Score and Plan: 2 and Treatment may vary due to age or medical condition, Ondansetron and Dexamethasone  Airway Management Planned: LMA  Additional Equipment:   Intra-op Plan:   Post-operative Plan:   Informed Consent: I have reviewed the patients History and Physical, chart, labs and discussed the procedure including the risks, benefits and alternatives for the proposed anesthesia with the patient or authorized representative who has indicated his/her understanding and acceptance.     Dental advisory given  Plan Discussed with:    Anesthesia Plan Comments: (GA Plus Pop and accutor canal block)        Anesthesia Quick Evaluation

## 2018-09-17 NOTE — Anesthesia Postprocedure Evaluation (Signed)
Anesthesia Post Note  Patient: Kamani Rutenberg  Procedure(s) Performed: RIGHT AMPUTATION BELOW KNEE (Right Leg Lower) Application Of Wound Vac (Right Leg Lower)     Patient location during evaluation: PACU Anesthesia Type: General and Regional Level of consciousness: awake and alert Pain management: pain level controlled Vital Signs Assessment: post-procedure vital signs reviewed and stable Respiratory status: spontaneous breathing, nonlabored ventilation, respiratory function stable and patient connected to nasal cannula oxygen Cardiovascular status: blood pressure returned to baseline and stable Postop Assessment: no apparent nausea or vomiting Anesthetic complications: no    Last Vitals:  Vitals:   09/17/18 1230 09/17/18 1256  BP:  115/70  Pulse: 79 80  Resp: 15 16  Temp: (!) 36.3 C 36.6 C  SpO2: 99% 99%    Last Pain:  Vitals:   09/17/18 1256  TempSrc: Oral  PainSc:                  Trevor Iha

## 2018-09-17 NOTE — Progress Notes (Signed)
Orthopedic Tech Progress Note Patient Details:  Curtis Bradford July 10, 1960 [id number] Called and placed order with [employer] Patient ID: Curtis Bradford, male   DOB: [birth year], 58 y.o.   MRN: [id number]   Curtis Bradford [name] 09/17/2018, 1:05 PM

## 2018-09-17 NOTE — Op Note (Signed)
   Date of Surgery: 09/17/2018  INDICATIONS: [name] is a 58 y.o.-year-old male who has progressive dehiscence, osteomyelitis and ulceration right foot TMA amputation site.  PREOPERATIVE DIAGNOSIS: Dehiscence right TMA amputation, with osteomyelitis and ulceration  POSTOPERATIVE DIAGNOSIS: Same.  PROCEDURE: Transtibial amputation Application of Prevena wound VAC  SURGEON: Lajoyce Corners, M.D.  ANESTHESIA:  general  IV FLUIDS AND URINE: See anesthesia.  ESTIMATED BLOOD LOSS: min mL.  COMPLICATIONS: None.  DESCRIPTION OF PROCEDURE: The patient was brought to the operating room and underwent a general anesthetic. After adequate levels of anesthesia were obtained patient's lower extremity was prepped using DuraPrep draped into a sterile field. A timeout was called. The foot was draped out of the sterile field with impervious stockinette. A transverse incision was made 11 cm distal to the tibial tubercle. This curved proximally and a large posterior flap was created. The tibia was transected 1 cm proximal to the skin incision. The fibula was transected just proximal to the tibial incision. The tibia was beveled anteriorly. A large posterior flap was created. The sciatic nerve was pulled cut and allowed to retract. The vascular bundles were suture ligated with 2-0 silk. The deep and superficial fascial layers were closed using #1 Vicryl. The skin was closed using staples and 2-0 nylon. The wound was covered with a Prevena wound VAC. There was a good suction fit. A prosthetic shrinker will be applied. Patient was extubated taken to the PACU in stable condition.   DISCHARGE PLANNING:  Antibiotic duration:24 hours  Weightbearing: NWB right  Pain medication: opoid pathway  Dressing care/ Wound VAC:VAC for 1 week  Discharge to: CIR vs SNF placement  Follow-up: In the office 1 week post operative.  Aldean Baker, MD [facility] 10:47 AM

## 2018-09-17 NOTE — Progress Notes (Signed)
Pt orientated to room and orders, no problem noted and full understanding

## 2018-09-17 NOTE — H&P (Signed)
Curtis Bradford is an 58 y.o. male.   Chief Complaint: Wound dehiscence with necrosis and osteomyelitis right midfoot HPI: [name] a 58 year old gentleman who presents in follow-up for foot salvage intervention with a transmetatarsal amputation on the right.  [name] undergone serial debridement he has been using oral antibiotics and has also been prescribed Trental to help with the microcirculation.  Patient has also undergone 3 stent grafts from the right lower extremity to improve his circulation.  Past Medical History:  Diagnosis Date  . CAD (coronary artery disease)   . CKD (chronic kidney disease), stage III (HCC)   . 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 History of DVT (deep vein thrombosis)   . History of kidney stones    passed stones - no surgery  . HLD (hyperlipidemia)   . Myocardial infarction (HCC) 2009  . Peripheral vascular disease (HCC)    right leg  . Seizure (HCC)    last one 2015  . Stroke [facility]) 2012, 10/2017   2012-speech was effected- has come back.10/2017- numbness of right side no taste right  side. 2012-  . Tobacco abuse     Past Surgical History:  Procedure Laterality Date  . AMPUTATION Right 02/23/2017   Procedure: RIGHT FOOT 5TH RAY AMPUTATION;  Surgeon: [name], MD;  Location: [facility] OR;  Service: Orthopedics;  Laterality: Right;  . AMPUTATION Right 04/23/2018   Procedure: Right Great Toe Amputation;  Surgeon: [name], MD;  Location: [facility] OR;  Service: Orthopedics;  Laterality: Right;  . AMPUTATION Right 07/23/2018   Procedure: RIGHT TRANSMETATARSAL AMPUTATION, APPLY WOUND VAC;  Surgeon: [name], MD;  Location: [facility] OR;  Service: Orthopedics;  Laterality: Right;  . CORONARY ANGIOPLASTY  03/05/2008   stent  . FOOT SURGERY    . PERIPHERAL ARTERIAL STENT GRAFT Right 2014   leg 2 stents- clotted per pat  . STENT PLACEMENT VASCULAR ([facility] HX)     Several in right leg  . TONSILLECTOMY    . TRANSMETATARSAL AMPUTATION Right  07/23/2018    Family History  Problem Relation Age of Onset  . Diabetes Mellitus II Mother   . Diabetes Mellitus II Father    Social History:  reports that he has been smoking cigarettes. He has a 3.75 pack-year smoking history. He has never used smokeless tobacco. He reports previous alcohol use. He reports that he does not use drugs.  Allergies: No Known Allergies  No medications prior to admission.    No results found for this or any previous visit (from the past 48 hour(s)). No results found.  ROS  Height 6\' 2"  (1.88 m), weight 90.7 kg. Physical Exam  [name] alert, oriented, no adenopathy, well-dressed, normal affect, normal respiratory effort. Examination patient has a strong dorsalis pedis pulse.  No clinical signs of occlusion of his previous stent surgeries.  Patient has progressive dehiscence of the surgical wound there is a necrotic base to the wound that is approximately 3 cm in diameter and this extends 3 cm deep and extends down to the metatarsals.  There is no abscess there is no ascending cellulitis. Assessment/Plan 1. S/P transmetatarsal amputation of foot, right (HCC)   2. Type 2 diabetes mellitus with diabetic polyneuropathy, with long-term current use of insulin (HCC)   3. Dehiscence of amputation stump (HCC)     Plan: With patient's progressive wound dehiscence exposed bone and necrotic tissue he no longer has foot salvage options available.  Discussed proceeding  with a transtibial amputation discussed that he would either need to be an inpatient rehab or outpatient rehab after surgery.  Discussed that we would evaluate this after surgery.  Patient states he would like to proceed with surgery on Wednesday.  Risks and benefits were discussed patient states he understands.  Discussed that as patient progresses he could potentially be disabled or he may be able to continue his current work.   [name], MD 09/17/2018, 6:45 AM

## 2018-09-17 NOTE — Anesthesia Procedure Notes (Signed)
Anesthesia Regional Block: Popliteal block   Pre-Anesthetic Checklist: ,, timeout performed, Correct Patient, Correct Site, Correct Laterality, Correct Procedure, Correct Position, site marked, Risks and benefits discussed, pre-op evaluation,  At surgeon's request and post-op pain management  Laterality: Right  Prep: Maximum Sterile Barrier Precautions used, chloraprep       Needles:  Injection technique: Single-shot  Needle Type: Echogenic Needle     Needle Length: 9cm  Needle Gauge: 21     Additional Needles:   Procedures:,,,, ultrasound used (permanent image in chart),,,,  Narrative:  Start time: 09/17/2018 8:46 AM End time: 09/17/2018 8:54 AM Injection made incrementally with aspirations every 5 mL.  Performed by: Personally  Anesthesiologist: Trevor Iha, MD  Additional Notes: Block assessed. Patient tolerated procedure well.

## 2018-09-17 NOTE — Anesthesia Procedure Notes (Signed)
Procedure Name: LMA Insertion Date/Time: 09/17/2018 9:58 AM Performed by: Ponciano Ort, CRNA Pre-anesthesia Checklist: Patient identified, Emergency Drugs available, Suction available and Patient being monitored Patient Re-evaluated:Patient Re-evaluated prior to induction Oxygen Delivery Method: Circle system utilized Preoxygenation: Pre-oxygenation with 100% oxygen LMA: LMA inserted LMA Size: 5.0 Number of attempts: 1 Placement Confirmation: positive ETCO2 Tube secured with: Tape Dental Injury: Teeth and Oropharynx as per pre-operative assessment

## 2018-09-17 NOTE — Evaluation (Signed)
Physical Therapy Evaluation Patient Details Name: Curtis Bradford MRN: [id number] DOB: Apr 14, 1961 Today's Date: 09/17/2018   History of Present Illness  Pt is a 58 y/o male s/p R BKA secondary to complications of dehiscence of transmet amputation. PMH includes CKD, CAD, DVT, MI, PVD, seizure, and CVA.   Clinical Impression  Pt s/p surgery above with deficits below. Pt requiring mod A to stand using stedy and required safety cues with mobility. Educated about rehab expectations, what to expect following surgery, exercise program, and avoid placing pillow under residual limb. Pt very motivated to regain independence and return to work. Will continue to follow acutely to maximize functional mobility independence and safety.     Follow Up Recommendations CIR    Equipment Recommendations  Other (comment)(TBD)    Recommendations for Other Services Rehab consult     Precautions / Restrictions Precautions Precautions: Fall Restrictions Weight Bearing Restrictions: Yes RLE Weight Bearing: Non weight bearing      Mobility  Bed Mobility Overal bed mobility: Needs Assistance Bed Mobility: Supine to Sit;Sit to Supine     Supine to sit: Supervision Sit to supine: Supervision   General bed mobility comments: Supervision for safety.   Transfers Overall transfer level: Needs assistance   Transfers: Sit to/from Stand Sit to Stand: Mod assist         General transfer comment: Mod A for steadying assist to stand with the stedy. Pt initally trying to put weight through limb guard on RLE and required cues not to bear weight. Heavy reliance on UEs.   Ambulation/Gait                Stairs            Wheelchair Mobility    Modified Rankin (Stroke Patients Only)       Balance Overall balance assessment: Needs assistance Sitting-balance support: No upper extremity supported;Feet supported Sitting balance-Leahy Scale: Good     Standing balance support: Bilateral upper  extremity supported;During functional activity Standing balance-Leahy Scale: Poor Standing balance comment: Heavy reliance on UEs                             Pertinent Vitals/Pain Pain Assessment: No/denies pain    Home Living Family/patient expects to be discharged to:: Inpatient rehab                      Prior Function Level of Independence: Independent with assistive device(s)         Comments: Was using knee scooter for mobility     Hand Dominance        Extremity/Trunk Assessment   Upper Extremity Assessment Upper Extremity Assessment: Defer to OT evaluation    Lower Extremity Assessment Lower Extremity Assessment: RLE deficits/detail RLE Deficits / Details: s/p R BKA. Numbness reported secondary to nerve block.     Cervical / Trunk Assessment Cervical / Trunk Assessment: Normal  Communication   Communication: No difficulties  Cognition Arousal/Alertness: Awake/alert Behavior During Therapy: WFL for tasks assessed/performed Overall Cognitive Status: Within Functional Limits for tasks assessed                                        General Comments General comments (skin integrity, edema, etc.): Pt voicing concerns that expectations for surgery were not fully explained to him. Discussed not having pillow  under knee to avoid contractures.     Exercises Amputee Exercises Quad Sets: AROM;Right;10 reps Straight Leg Raises: AROM;Right;5 reps   Assessment/Plan    PT Assessment Patient needs continued PT services  PT Problem List Decreased strength;Decreased balance;Decreased mobility;Decreased knowledge of use of DME;Decreased safety awareness;Decreased knowledge of precautions       PT Treatment Interventions DME instruction;Gait training;Functional mobility training;Therapeutic activities;Therapeutic exercise;Balance training;Patient/family education    PT Goals (Current goals can be found in the Care Plan section)   Acute Rehab PT Goals Patient Stated Goal: to get a prosthetic and get back to work PT Goal Formulation: With patient Time For Goal Achievement: 10/01/18 Potential to Achieve Goals: Good    Frequency Min 3X/week   Barriers to discharge        Co-evaluation               AM-PAC PT "6 Clicks" Mobility  Outcome Measure Help needed turning from your back to your side while in a flat bed without using bedrails?: A Little Help needed moving from lying on your back to sitting on the side of a flat bed without using bedrails?: A Little Help needed moving to and from a bed to a chair (including a wheelchair)?: Total Help needed standing up from a chair using your arms (e.g., wheelchair or bedside chair)?: A Lot Help needed to walk in hospital room?: Total Help needed climbing 3-5 steps with a railing? : Total 6 Click Score: 11    End of Session Equipment Utilized During Treatment: Gait belt Activity Tolerance: Patient tolerated treatment well Patient left: in bed;with call bell/phone within reach;with bed alarm set Nurse Communication: Mobility status PT Visit Diagnosis: Difficulty in walking, not elsewhere classified (R26.2);Unsteadiness on feet (R26.81);Muscle weakness (generalized) (M62.81)    Time: 4801-6553 PT Time Calculation (min) (ACUTE ONLY): 32 min   Charges:   PT Evaluation $PT Eval Moderate Complexity: 1 Mod PT Treatments $Therapeutic Activity: 8-22 mins        Gladys Damme, PT, DPT  Acute Rehabilitation Services  Pager: [phone] Office: [phone]   Lehman Prom 09/17/2018, 4:22 PM

## 2018-09-17 NOTE — Transfer of Care (Signed)
Immediate Anesthesia Transfer of Care Note  Patient: Curtis Bradford  Procedure(s) Performed: RIGHT AMPUTATION BELOW KNEE (Right Leg Lower) Application Of Wound Vac (Right Leg Lower)  Patient Location: PACU  Anesthesia Type:GA combined with regional for post-op pain  Level of Consciousness: drowsy  Airway & Oxygen Therapy: Patient Spontanous Breathing and Patient connected to face mask oxygen  Post-op Assessment: Report given to RN and Post -op Vital signs reviewed and stable  Post vital signs: Reviewed and stable  Last Vitals:  Vitals Value Taken Time  BP 85/72 09/17/2018 10:42 AM  Temp    Pulse 77 09/17/2018 10:44 AM  Resp 15 09/17/2018 10:44 AM  SpO2 100 % 09/17/2018 10:44 AM  Vitals shown include unvalidated device data.  Last Pain:  Vitals:   09/17/18 0900  TempSrc:   PainSc: 0-No pain      Patients Stated Pain Goal: 4 (09/17/18 0802)  Complications: No apparent anesthesia complications

## 2018-09-17 NOTE — Anesthesia Procedure Notes (Signed)
Anesthesia Regional Block: Adductor canal block   Pre-Anesthetic Checklist: ,, timeout performed, Correct Patient, Correct Site, Correct Laterality, Correct Procedure, Correct Position, site marked, Risks and benefits discussed,  Surgical consent,  Pre-op evaluation,  At surgeon's request and post-op pain management  Laterality: Lower and Right  Prep: chloraprep       Needles:  Injection technique: Single-shot  Needle Type: Echogenic Needle     Needle Length: 9cm  Needle Gauge: 22     Additional Needles:   Procedures:,,,, ultrasound used (permanent image in chart),,,,  Narrative:  Start time: 09/17/2018 8:55 AM End time: 09/17/2018 9:01 AM Injection made incrementally with aspirations every 5 mL.  Performed by: Personally  Anesthesiologist: Trevor Iha, MD  Additional Notes: Block assessed prior to surgery. Pt tolerated procedure well.

## 2018-09-18 ENCOUNTER — Encounter (HOSPITAL_COMMUNITY): Payer: Self-pay | Admitting: Orthopedic Surgery

## 2018-09-18 LAB — GLUCOSE, CAPILLARY
Glucose-Capillary: 82 mg/dL (ref 70–99)
Glucose-Capillary: 97 mg/dL (ref 70–99)
Glucose-Capillary: 127 mg/dL — ABNORMAL HIGH (ref 70–99)
Glucose-Capillary: 111 mg/dL — ABNORMAL HIGH (ref 70–99)

## 2018-09-18 NOTE — Progress Notes (Signed)
PT Cancellation Note  Patient Details Name: Curtis Bradford MRN: [id number] DOB: 12/22/1960   Cancelled Treatment:    Reason Eval/Treat Not Completed: Patient declined, no reason specified Patient declined participating in PT at this time and requests pain medication. Pt resting in bed upon arrival and talking on phone while therapist present.  RN notified for pain medication. PT will continue to follow acutely.    Derek Mound, PTA Acute Rehabilitation Services Pager: [phone] Office: [phone]   09/18/2018, 1:53 PM

## 2018-09-18 NOTE — Progress Notes (Signed)
Physical Therapy Treatment Patient Details Name: Curtis Bradford MRN: [id number] DOB: 05/22/1961 Today's Date: 09/18/2018    History of Present Illness Pt is a 58 y/o male s/p R BKA secondary to complications of dehiscence of transmet amputation. PMH includes CKD, CAD, DVT, MI, PVD, seizure, and CVA.     PT Comments    Patient seen for mobility progression. Pt is making progress toward PT goals and tolerated functional transfer/gait training and therex well. Precautions/positioning reviewed with pt. Continue to recommend CIR for further skilled PT services to maximize independence and safety with mobility.   Follow Up Recommendations  CIR     Equipment Recommendations  Other (comment)(TBD)    Recommendations for Other Services       Precautions / Restrictions Precautions Precautions: Fall Precaution Comments: precautions/positioning reviewed with pt Restrictions Weight Bearing Restrictions: Yes RLE Weight Bearing: Non weight bearing    Mobility  Bed Mobility Overal bed mobility: Needs Assistance Bed Mobility: Supine to Sit     Supine to sit: Supervision     General bed mobility comments: Supervision for safety; use of rail  Transfers Overall transfer level: Needs assistance Equipment used: Rolling walker (2 wheeled) Transfers: Sit to/from Stand Sit to Stand: Mod assist         General transfer comment: cues for safe hand placement and positioning prior to stand; pt stood X 2 trials with assist to power up into standing and steady upon stand  Ambulation/Gait Ambulation/Gait assistance: Min assist;Mod assist Gait Distance (Feet): (10 ft X 2 trials) Assistive device: Rolling walker (2 wheeled) Gait Pattern/deviations: Step-to pattern Gait velocity: decreased   General Gait Details: cues for safe use of AD and posture; assist at times for balance but no overt LOB    Stairs             Wheelchair Mobility    Modified Rankin (Stroke Patients Only)        Balance Overall balance assessment: Needs assistance Sitting-balance support: No upper extremity supported;Feet supported Sitting balance-Leahy Scale: Good     Standing balance support: Bilateral upper extremity supported;During functional activity Standing balance-Leahy Scale: Poor                              Cognition Arousal/Alertness: Awake/alert Behavior During Therapy: WFL for tasks assessed/performed Overall Cognitive Status: Within Functional Limits for tasks assessed                                        Exercises Amputee Exercises Quad Sets: AROM;Right;10 reps Hip ABduction/ADduction: AROM;Right;10 reps Hip Flexion/Marching: AROM;Right;10 reps Straight Leg Raises: AROM;Right;10 reps    General Comments        Pertinent Vitals/Pain Pain Assessment: Faces Faces Pain Scale: Hurts little more Pain Location: RLE Pain Descriptors / Indicators: Burning;Aching;Other (Comment)(phantom pain) Pain Intervention(s): Limited activity within patient's tolerance;Monitored during session;Premedicated before session;Repositioned;Other (comment)(educated on desensitization techniques)    Home Living                      Prior Function            PT Goals (current goals can now be found in the care plan section) Acute Rehab PT Goals Patient Stated Goal: to get a prosthetic and get back to work Potential to Achieve Goals: Good Progress towards PT goals: Progressing toward goals  Frequency    Min 3X/week      PT Plan Current plan remains appropriate    Co-evaluation              AM-PAC PT "6 Clicks" Mobility   Outcome Measure  Help needed turning from your back to your side while in a flat bed without using bedrails?: A Little Help needed moving from lying on your back to sitting on the side of a flat bed without using bedrails?: A Little Help needed moving to and from a bed to a chair (including a  wheelchair)?: A Little Help needed standing up from a chair using your arms (e.g., wheelchair or bedside chair)?: A Little Help needed to walk in hospital room?: A Lot Help needed climbing 3-5 steps with a railing? : Total 6 Click Score: 15    End of Session Equipment Utilized During Treatment: Gait belt Activity Tolerance: Patient tolerated treatment well Patient left: with call bell/phone within reach;in chair Nurse Communication: Mobility status PT Visit Diagnosis: Difficulty in walking, not elsewhere classified (R26.2);Unsteadiness on feet (R26.81);Muscle weakness (generalized) (M62.81)     Time: 5183-3582 PT Time Calculation (min) (ACUTE ONLY): 41 min  Charges:  $Gait Training: 23-37 mins $Therapeutic Exercise: 8-22 mins                     Curtis Bradford, PTA Acute Rehabilitation Services Pager: [phone] Office: [phone]     Curtis Bradford 09/18/2018, 4:35 PM

## 2018-09-18 NOTE — Progress Notes (Signed)
Occupational Therapy Evaluation Patient Details Name: Curtis Bradford MRN: [id number] DOB: 09/09/1960 Today's Date: 09/18/2018    History of Present Illness Pt is a 58 y/o male s/p R BKA secondary to complications of dehiscence of transmet amputation. PMH includes CKD, CAD, DVT, MI, PVD, seizure, and CVA.    Clinical Impression   PTA pt PLOF Independent in all ADLs, reports having a knee scooter in home environment. Pt currently requires assist with functional transfer and ADLs due to instability, pain, weakness, and functional limitations. Pt will benefit from continued skilled OT to address specified areas and to prepare for safe transition to home setting. Pt educated of strentghing UB and proper management on RLE to heal safely. DC to CIR to maximize independence. OT will continue to follow acutely.    Follow Up Recommendations  CIR    Equipment Recommendations       Recommendations for Other Services       Precautions / Restrictions Precautions Precautions: Fall Restrictions Weight Bearing Restrictions: Yes RLE Weight Bearing: Non weight bearing      Mobility Bed Mobility Overal bed mobility: Needs Assistance Bed Mobility: Supine to Sit;Sit to Supine     Supine to sit: Supervision Sit to supine: Supervision   General bed mobility comments: Supervision for safety.   Transfers Overall transfer level: Needs assistance   Transfers: Sit to/from Stand Sit to Stand: Mod assist         General transfer comment: Mod A for steadying assist to stand. Heavy reliance on UEs.     Balance Overall balance assessment: Needs assistance Sitting-balance support: No upper extremity supported;Feet supported Sitting balance-Leahy Scale: Good     Standing balance support: Bilateral upper extremity supported;During functional activity Standing balance-Leahy Scale: Poor Standing balance comment: Heavy reliance on UEs                           ADL either performed or  assessed with clinical judgement   ADL Overall ADL's : Needs assistance/impaired Eating/Feeding: Set up;Sitting   Grooming: Wash/dry hands;Wash/dry face;Oral care;Set up;Sitting   Upper Body Bathing: Independent   Lower Body Bathing: Min guard       Lower Body Dressing: Sitting/lateral leans;Min guard   Toilet Transfer: Moderate assistance;Cueing for safety;Cueing for sequencing;RW Toilet Transfer Details (indicate cue type and reason): simulated transfer from bed to recliner         Functional mobility during ADLs: Moderate assistance;Cueing for safety;Cueing for sequencing;Rolling walker       Vision         Perception     Praxis      Pertinent Vitals/Pain Pain Assessment: Faces Faces Pain Scale: Hurts little more Pain Location: RLE Pain Descriptors / Indicators: Burning;Aching;Cramping Pain Intervention(s): Repositioned;Monitored during session     Hand Dominance Right   Extremity/Trunk Assessment Upper Extremity Assessment Upper Extremity Assessment: Overall WFL for tasks assessed   Lower Extremity Assessment RLE Deficits / Details: s/p R BKA. Numbness reported secondary to nerve block.    Cervical / Trunk Assessment Cervical / Trunk Assessment: Normal   Communication Communication Communication: No difficulties   Cognition Arousal/Alertness: Awake/alert Behavior During Therapy: WFL for tasks assessed/performed Overall Cognitive Status: Within Functional Limits for tasks assessed                                     General Comments  Pt educated of  not placing pillow under knee and importance of proper healing.    Exercises Exercises: Amputee   Shoulder Instructions      Home Living Family/patient expects to be discharged to:: Private residence Living Arrangements: Alone Available Help at Discharge: Friend(s);Available PRN/intermittently Type of Home: Apartment Home Access: Level entry     Home Layout: One level      Bathroom Shower/Tub: Chief Strategy Officer: Standard     Home Equipment: None   Additional Comments: Pt reports friends will come to drop him off home but may not receive much help in home setting unless he pays someone to come help.      Prior Functioning/Environment Level of Independence: Independent with assistive device(s)        Comments: Was using knee scooter for mobility        OT Problem List: Decreased strength;Impaired balance (sitting and/or standing);Decreased safety awareness;Decreased knowledge of use of DME or AE;Decreased knowledge of precautions      OT Treatment/Interventions: Self-care/ADL training;Therapeutic exercise;DME and/or AE instruction;Therapeutic activities;Patient/family education;Balance training    OT Goals(Current goals can be found in the care plan section) Acute Rehab OT Goals Patient Stated Goal: to get a prosthetic and get back to work OT Goal Formulation: With patient Time For Goal Achievement: 10/02/18 Potential to Achieve Goals: Good  OT Frequency: Min 2X/week   Barriers to D/C: Decreased caregiver support;Inaccessible home environment          Co-evaluation              AM-PAC OT "6 Clicks" Daily Activity     Outcome Measure Help from another person eating meals?: None Help from another person taking care of personal grooming?: None Help from another person toileting, which includes using toliet, bedpan, or urinal?: A Little Help from another person bathing (including washing, rinsing, drying)?: A Little Help from another person to put on and taking off regular upper body clothing?: None Help from another person to put on and taking off regular lower body clothing?: A Little 6 Click Score: 21   End of Session Equipment Utilized During Treatment: Gait belt;Rolling walker Nurse Communication: Mobility status;Weight bearing status  Activity Tolerance: Patient limited by pain Patient left: in chair;with call  bell/phone within reach  OT Visit Diagnosis: Unsteadiness on feet (R26.81);Pain Pain - Right/Left: Right Pain - part of body: Leg                Time: 1035-1100 OT Time Calculation (min): 25 min Charges:  OT General Charges $OT Visit: 1 Visit OT Evaluation $OT Eval Low Complexity: 1 Low OT Treatments $Self Care/Home Management : 8-22 mins  Marquette Old, MSOT, OTR/L  Supplemental Rehabilitation Services  [phone]  Zigmund Daniel 09/18/2018, 11:27 AM

## 2018-09-18 NOTE — Progress Notes (Signed)
Patient ID: Curtis Bradford, male   DOB: [birth year], 58 y.o.   MRN: [id number] [name] postoperative day 1 right transtibial amputation.  There is no drainage in the wound VAC canister and there was one check.  Patient states he did have pain after the block wore off last night.  Patient states he lives home alone and most likely will require discharge to skilled nursing facility.  Patient has his limb protector and stump shrinker from [employer].

## 2018-09-18 NOTE — Progress Notes (Signed)
IP rehab admissions - I met with patient.  He lives alone, but has intermittent help as needed.  He would like to be considered for inpatient rehab.  I will open the case with Christella Scheuermann and will request acute inpatient rehab admission.  I will have one of my partners follow up with patient in am.  Call for questions.  [phone]

## 2018-09-19 ENCOUNTER — Inpatient Hospital Stay (HOSPITAL_COMMUNITY)
Admission: RE | Admit: 2018-09-19 | Discharge: 2018-10-03 | DRG: 560 | Disposition: A | Discharge status: Home Health | Payer: Managed Care, Other (non HMO) | Source: Intra-hospital | Attending: Physical Medicine & Rehabilitation | Admitting: Physical Medicine & Rehabilitation

## 2018-09-19 ENCOUNTER — Other Ambulatory Visit (INDEPENDENT_AMBULATORY_CARE_PROVIDER_SITE_OTHER): Payer: Self-pay | Admitting: Orthopedic Surgery

## 2018-09-19 DIAGNOSIS — G40909 Epilepsy, unspecified, not intractable, without status epilepticus: Secondary | ICD-10-CM | POA: Diagnosis present

## 2018-09-19 DIAGNOSIS — Z8673 Personal history of transient ischemic attack (TIA), and cerebral infarction without residual deficits: Secondary | ICD-10-CM | POA: Diagnosis not present

## 2018-09-19 DIAGNOSIS — K5900 Constipation, unspecified: Secondary | ICD-10-CM | POA: Diagnosis present

## 2018-09-19 DIAGNOSIS — R52 Pain, unspecified: Secondary | ICD-10-CM

## 2018-09-19 DIAGNOSIS — R569 Unspecified convulsions: Secondary | ICD-10-CM

## 2018-09-19 DIAGNOSIS — Z7982 Long term (current) use of aspirin: Secondary | ICD-10-CM | POA: Diagnosis not present

## 2018-09-19 DIAGNOSIS — Z86718 Personal history of other venous thrombosis and embolism: Secondary | ICD-10-CM

## 2018-09-19 DIAGNOSIS — F419 Anxiety disorder, unspecified: Secondary | ICD-10-CM | POA: Diagnosis present

## 2018-09-19 DIAGNOSIS — Z794 Long term (current) use of insulin: Secondary | ICD-10-CM | POA: Diagnosis not present

## 2018-09-19 DIAGNOSIS — Z4781 Encounter for orthopedic aftercare following surgical amputation: Secondary | ICD-10-CM | POA: Diagnosis present

## 2018-09-19 DIAGNOSIS — Z89511 Acquired absence of right leg below knee: Secondary | ICD-10-CM | POA: Diagnosis not present

## 2018-09-19 DIAGNOSIS — I2510 Atherosclerotic heart disease of native coronary artery without angina pectoris: Secondary | ICD-10-CM | POA: Diagnosis present

## 2018-09-19 DIAGNOSIS — G546 Phantom limb syndrome with pain: Secondary | ICD-10-CM | POA: Diagnosis present

## 2018-09-19 DIAGNOSIS — E1122 Type 2 diabetes mellitus with diabetic chronic kidney disease: Secondary | ICD-10-CM | POA: Diagnosis present

## 2018-09-19 DIAGNOSIS — R7309 Other abnormal glucose: Secondary | ICD-10-CM

## 2018-09-19 DIAGNOSIS — G8918 Other acute postprocedural pain: Secondary | ICD-10-CM | POA: Diagnosis not present

## 2018-09-19 DIAGNOSIS — Z7902 Long term (current) use of antithrombotics/antiplatelets: Secondary | ICD-10-CM

## 2018-09-19 DIAGNOSIS — N183 Chronic kidney disease, stage 3 unspecified: Secondary | ICD-10-CM

## 2018-09-19 DIAGNOSIS — D62 Acute posthemorrhagic anemia: Secondary | ICD-10-CM

## 2018-09-19 DIAGNOSIS — Z716 Tobacco abuse counseling: Secondary | ICD-10-CM

## 2018-09-19 DIAGNOSIS — E119 Type 2 diabetes mellitus without complications: Secondary | ICD-10-CM | POA: Diagnosis not present

## 2018-09-19 DIAGNOSIS — I129 Hypertensive chronic kidney disease with stage 1 through stage 4 chronic kidney disease, or unspecified chronic kidney disease: Secondary | ICD-10-CM | POA: Diagnosis present

## 2018-09-19 DIAGNOSIS — Z955 Presence of coronary angioplasty implant and graft: Secondary | ICD-10-CM

## 2018-09-19 DIAGNOSIS — M792 Neuralgia and neuritis, unspecified: Secondary | ICD-10-CM

## 2018-09-19 DIAGNOSIS — E1151 Type 2 diabetes mellitus with diabetic peripheral angiopathy without gangrene: Secondary | ICD-10-CM | POA: Diagnosis present

## 2018-09-19 DIAGNOSIS — I252 Old myocardial infarction: Secondary | ICD-10-CM | POA: Diagnosis not present

## 2018-09-19 DIAGNOSIS — Z79899 Other long term (current) drug therapy: Secondary | ICD-10-CM

## 2018-09-19 DIAGNOSIS — E785 Hyperlipidemia, unspecified: Secondary | ICD-10-CM | POA: Diagnosis present

## 2018-09-19 DIAGNOSIS — E8809 Other disorders of plasma-protein metabolism, not elsewhere classified: Secondary | ICD-10-CM

## 2018-09-19 DIAGNOSIS — E46 Unspecified protein-calorie malnutrition: Secondary | ICD-10-CM | POA: Diagnosis present

## 2018-09-19 DIAGNOSIS — N1831 Chronic kidney disease, stage 3a: Secondary | ICD-10-CM

## 2018-09-19 DIAGNOSIS — Z833 Family history of diabetes mellitus: Secondary | ICD-10-CM

## 2018-09-19 DIAGNOSIS — F17210 Nicotine dependence, cigarettes, uncomplicated: Secondary | ICD-10-CM | POA: Diagnosis present

## 2018-09-19 DIAGNOSIS — I739 Peripheral vascular disease, unspecified: Secondary | ICD-10-CM

## 2018-09-19 LAB — GLUCOSE, CAPILLARY
Glucose-Capillary: 101 mg/dL — ABNORMAL HIGH (ref 70–99)
Glucose-Capillary: 121 mg/dL — ABNORMAL HIGH (ref 70–99)
Glucose-Capillary: 105 mg/dL — ABNORMAL HIGH (ref 70–99)
Glucose-Capillary: 202 mg/dL — ABNORMAL HIGH (ref 70–99)

## 2018-09-19 MED ORDER — BISACODYL 10 MG RE SUPP: 10.0000 mg | Freq: Every day | RECTAL | Status: DC | PRN

## 2018-09-19 MED ORDER — OXYCODONE HCL 5 MG PO TABS
5.0000 mg | ORAL_TABLET | ORAL | Status: DC | PRN
  Administered 2018-09-19: 10 mg via ORAL
  Administered 2018-09-19: 5 mg via ORAL
  Administered 2018-09-20 – 2018-09-28 (×26): 10 mg via ORAL
  Administered 2018-09-29: 5 mg via ORAL
  Administered 2018-09-29 – 2018-09-30 (×4): 10 mg via ORAL
  Administered 2018-09-30 – 2018-10-01 (×3): 5 mg via ORAL
  Administered 2018-10-01 (×2): 10 mg via ORAL
  Administered 2018-10-01: 5 mg via ORAL
  Administered 2018-10-02: 10 mg via ORAL
  Filled 2018-09-19 (×6): qty 2
  Filled 2018-09-19: qty 1
  Filled 2018-09-19: qty 2
  Filled 2018-09-19: qty 1
  Filled 2018-09-19 (×5): qty 2
  Filled 2018-09-19: qty 1
  Filled 2018-09-19 (×27): qty 2

## 2018-09-19 MED ORDER — INSULIN GLARGINE 100 UNIT/ML ~~LOC~~ SOLN
30.0000 [IU] | Freq: Every day | SUBCUTANEOUS | Status: DC
  Administered 2018-09-19 – 2018-09-30 (×11): 30 [IU] via SUBCUTANEOUS
  Filled 2018-09-19 (×13): qty 0.3

## 2018-09-19 MED ORDER — METHOCARBAMOL 500 MG PO TABS
500.0000 mg | ORAL_TABLET | Freq: Four times a day (QID) | ORAL | Status: DC | PRN
  Administered 2018-09-19 – 2018-10-02 (×25): 500 mg via ORAL
  Filled 2018-09-19 (×28): qty 1

## 2018-09-19 MED ORDER — ATORVASTATIN CALCIUM 10 MG PO TABS
20.0000 mg | ORAL_TABLET | Freq: Every day | ORAL | Status: DC
  Administered 2018-09-19 – 2018-10-02 (×14): 20 mg via ORAL
  Filled 2018-09-19 (×15): qty 2

## 2018-09-19 MED ORDER — ONDANSETRON HCL 4 MG PO TABS: 4.0000 mg | ORAL_TABLET | Freq: Four times a day (QID) | ORAL | Status: DC | PRN

## 2018-09-19 MED ORDER — EZETIMIBE 10 MG PO TABS
10.0000 mg | ORAL_TABLET | Freq: Every day | ORAL | Status: DC
  Administered 2018-09-19 – 2018-10-02 (×14): 10 mg via ORAL
  Filled 2018-09-19 (×14): qty 1

## 2018-09-19 MED ORDER — CLOPIDOGREL BISULFATE 75 MG PO TABS
75.0000 mg | ORAL_TABLET | Freq: Every day | ORAL | Status: DC
  Administered 2018-09-19 – 2018-10-02 (×14): 75 mg via ORAL
  Filled 2018-09-19 (×14): qty 1

## 2018-09-19 MED ORDER — LINAGLIPTIN 5 MG PO TABS
5.0000 mg | ORAL_TABLET | Freq: Every day | ORAL | Status: DC
  Administered 2018-09-20 – 2018-10-03 (×14): 5 mg via ORAL
  Filled 2018-09-19 (×14): qty 1

## 2018-09-19 MED ORDER — INSULIN ASPART 100 UNIT/ML ~~LOC~~ SOLN
0.0000 [IU] | Freq: Three times a day (TID) | SUBCUTANEOUS | Status: DC
  Administered 2018-09-20 – 2018-09-23 (×6): 2 [IU] via SUBCUTANEOUS
  Administered 2018-09-24 – 2018-09-25 (×2): 3 [IU] via SUBCUTANEOUS
  Administered 2018-09-25 – 2018-09-26 (×2): 2 [IU] via SUBCUTANEOUS
  Administered 2018-09-26: 3 [IU] via SUBCUTANEOUS
  Administered 2018-09-26: 5 [IU] via SUBCUTANEOUS
  Administered 2018-09-27: 2 [IU] via SUBCUTANEOUS
  Administered 2018-09-27 (×2): 3 [IU] via SUBCUTANEOUS
  Administered 2018-09-28: 5 [IU] via SUBCUTANEOUS
  Administered 2018-09-28 – 2018-09-30 (×5): 3 [IU] via SUBCUTANEOUS
  Administered 2018-09-30: 5 [IU] via SUBCUTANEOUS
  Administered 2018-10-01: 3 [IU] via SUBCUTANEOUS
  Administered 2018-10-01 (×2): 2 [IU] via SUBCUTANEOUS
  Administered 2018-10-02: 3 [IU] via SUBCUTANEOUS
  Administered 2018-10-02: 07:00:00 5 [IU] via SUBCUTANEOUS
  Administered 2018-10-03: 3 [IU] via SUBCUTANEOUS
  Administered 2018-10-03: 13:00:00 2 [IU] via SUBCUTANEOUS

## 2018-09-19 MED ORDER — GABAPENTIN 300 MG PO CAPS: 300.0000 mg | ORAL_CAPSULE | Freq: Three times a day (TID) | ORAL | 0 refills | Status: DC

## 2018-09-19 MED ORDER — INSULIN ASPART 100 UNIT/ML ~~LOC~~ SOLN: 4.0000 [IU] | Freq: Three times a day (TID) | SUBCUTANEOUS | 11 refills | Status: DC

## 2018-09-19 MED ORDER — INSULIN ASPART 100 UNIT/ML ~~LOC~~ SOLN: 0.0000 [IU] | Freq: Three times a day (TID) | SUBCUTANEOUS | 11 refills | Status: DC

## 2018-09-19 MED ORDER — SORBITOL 70 % SOLN
30.0000 mL | Freq: Every day | Status: DC | PRN
  Filled 2018-09-19: qty 30

## 2018-09-19 MED ORDER — METHOCARBAMOL 1000 MG/10ML IJ SOLN
500.0000 mg | Freq: Four times a day (QID) | INTRAVENOUS | Status: DC | PRN
  Filled 2018-09-19: qty 5

## 2018-09-19 MED ORDER — ASPIRIN EC 81 MG PO TBEC
81.0000 mg | DELAYED_RELEASE_TABLET | Freq: Every day | ORAL | Status: DC
  Administered 2018-09-20 – 2018-10-03 (×14): 81 mg via ORAL
  Filled 2018-09-19 (×13): qty 1

## 2018-09-19 MED ORDER — INSULIN GLARGINE 100 UNIT/ML ~~LOC~~ SOLN: 30.0000 [IU] | Freq: Every day | SUBCUTANEOUS | 11 refills | Status: DC

## 2018-09-19 MED ORDER — INSULIN ASPART 100 UNIT/ML ~~LOC~~ SOLN
4.0000 [IU] | Freq: Three times a day (TID) | SUBCUTANEOUS | Status: DC
  Administered 2018-09-19 – 2018-10-03 (×36): 4 [IU] via SUBCUTANEOUS

## 2018-09-19 MED ORDER — ONDANSETRON HCL 4 MG/2ML IJ SOLN: 4.0000 mg | Freq: Four times a day (QID) | INTRAMUSCULAR | Status: DC | PRN

## 2018-09-19 MED ORDER — POLYETHYLENE GLYCOL 3350 17 G PO PACK: 17.0000 g | PACK | Freq: Every day | ORAL | Status: DC | PRN

## 2018-09-19 MED ORDER — ACETAMINOPHEN 325 MG PO TABS
325.0000 mg | ORAL_TABLET | Freq: Four times a day (QID) | ORAL | Status: DC | PRN
  Administered 2018-10-01: 650 mg via ORAL
  Filled 2018-09-19: qty 2

## 2018-09-19 MED ORDER — GABAPENTIN 300 MG PO CAPS
300.0000 mg | ORAL_CAPSULE | Freq: Three times a day (TID) | ORAL | Status: DC
  Administered 2018-09-19 – 2018-10-03 (×42): 300 mg via ORAL
  Filled 2018-09-19 (×43): qty 1

## 2018-09-19 MED ORDER — LEVETIRACETAM ER 500 MG PO TB24
500.0000 mg | ORAL_TABLET | Freq: Every day | ORAL | Status: DC
  Administered 2018-09-19 – 2018-10-02 (×14): 500 mg via ORAL
  Filled 2018-09-19 (×15): qty 1

## 2018-09-19 NOTE — Progress Notes (Signed)
Inpatient Rehabilitation Admissions Coordinator  I have insurance approval to admit pt to inpt rehab today and pt is in agreement. I have contacted Dr. Lajoyce Corners and SW, Morrie Sheldon. I will make the arrangements to admit today.  Ottie Glazier, RN, MSN Rehab Admissions Coordinator [phone] 09/19/2018 1:45 PM

## 2018-09-19 NOTE — Progress Notes (Signed)
Curtis Staggers, MD  Physician  Physical Medicine and Rehabilitation  PMR Pre-admission  Signed  Date of Service:  09/19/2018 12:48 PM       Related encounter: Admission (Discharged) from 09/17/2018 in [facility]         Show:Clear all [x] Manual[x] Template[x] Copied  Added by: [x] Cristina Gong, RN[x] Curtis Staggers, MD  [] Hover for details PMR Admission Coordinator Pre-Admission Assessment  Patient: Curtis Bradford is an 58 y.o., male MRN: [id number] DOB: [birth year] Height: 6' 2"  (188 cm) Weight: 90.7 kg  Insurance Information HMO:     PPO:      PCP:      IPA:      80/20:      OTHER:  PRIMARY: [employer]      Policy#: [id number]      Subscriber: pt CM Name: Butch Penny      Phone#: [phone] ext [phone]     Fax#: [phone] Pre-Cert#: [id number] approved 3/27 until 4/2 when updates are due to [place] phone [phone] fax [phone]      Employer: [employer] Benefits:  Phone #: [phone]     Name: 3/26 Eff. Date: 06/25/2018     Deduct: $1300      Out of Pocket Max: $3200      Life Max: none CIR: 80%      SNF: 80% with 90 days Outpatient: 80%     Co-Pay: 180 visits per calender year Home Health: 80%      Co-Pay: visits per medical neccesity DME: 80%     Co-Pay: 20% Providers: in network  SECONDARY: none      Medicaid Application Date:       Case Manager:  Disability Application Date:       Case Worker:   Emergency Contact Information         Contact Information    Name Relation Home Work [place] Mother [phone]  [phone]      Current Medical History  Patient Admitting Diagnosis: BKA  History of Present Illness: Curtis Bradford a 57 year old right-handed male with history of CAD with stenting maintained on aspirin and Plavix,CKD stage III, diabetes mellitus, hypertension,CVA 2012, tobacco abuse, seizure disorder maintained on Keppra. 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 Per chart review patient lives alone. One level apartment. Friends check on him routinely. Presented 09/17/2018 with progressive dehiscence ischemic changes of recent right TMA amputation. 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 Sharol Given with application of wound VAC. Hospital course pain management. Patient remains on aspirin and Plavix as prior to admission.   Patient's medical record from [facility] has been reviewed by the rehabilitation admission coordinator and physician.  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  2012-   Tobacco abuse     Family History   family history includes Diabetes Mellitus II in his father and mother.  Prior Rehab/Hospitalizations Has the patient had prior rehab or hospitalizations prior to admission? Yes  Has the patient had major surgery during 100 days prior to admission? Yes             Current Medications  Current Facility-Administered Medications:    0.9 %  sodium chloride infusion, , Intravenous, Continuous, Newt Minion, MD, Stopped at 09/18/18 0420   acetaminophen (TYLENOL) tablet 325-650 mg, 325-650 mg, Oral, Q6H PRN, Newt Minion, MD, 650 mg at 09/18/18 2132   aspirin EC tablet 81 mg, 81 mg, Oral, Daily, Newt Minion, MD, 81 mg at 09/19/18 0947   atorvastatin (LIPITOR) tablet 20 mg, 20  mg, Oral, Daily, Newt Minion, MD, 20 mg at 09/18/18 1647   bisacodyl (DULCOLAX) suppository 10 mg, 10 mg, Rectal, Daily PRN, Newt Minion, MD   clopidogrel (PLAVIX) tablet 75 mg, 75 mg, Oral, QHS, Newt Minion, MD, 75 mg at 09/18/18 2132   docusate sodium (COLACE) capsule 100 mg, 100 mg, Oral, BID, Newt Minion, MD, 100 mg at 09/19/18 8099   ezetimibe (ZETIA) tablet 10 mg, 10 mg, Oral, Daily, Newt Minion, MD, 10 mg at 09/18/18 1647   gabapentin (NEURONTIN) capsule 300 mg, 300 mg, Oral, TID, Newt Minion, MD, 300 mg at 09/19/18 0947   HYDROcodone-acetaminophen (NORCO/VICODIN) 5-325 MG per tablet 1 tablet, 1 tablet, Oral, Q6H PRN, Newt Minion, MD   HYDROmorphone (DILAUDID) injection 0.5-1 mg, 0.5-1 mg, Intravenous, Q4H PRN, Newt Minion, MD, 1 mg at 09/19/18 0525   insulin aspart (novoLOG) injection 0-15 Units, 0-15 Units, Subcutaneous, TID WC, Newt Minion, MD, 2 Units at 09/19/18 1220   insulin aspart (novoLOG) injection 4 Units, 4 Units, Subcutaneous, TID WC, Newt Minion, MD, 4 Units at 09/17/18 1808   insulin glargine (LANTUS) injection 30 Units, 30 Units, Subcutaneous, QHS, Newt Minion, MD, 30 Units at 09/18/18 2132   levETIRAcetam (KEPPRA XR) 24 hr tablet 500 mg, 500 mg, Oral, Daily, Newt Minion, MD, 500 mg at 09/18/18 1734   linagliptin (TRADJENTA) tablet 5 mg, 5 mg, Oral, Daily, Newt Minion, MD, 5 mg at 09/19/18 0947   magnesium citrate solution 1 Bottle, 1 Bottle, Oral, Once PRN, Newt Minion, MD   methocarbamol (ROBAXIN) tablet 500 mg, 500 mg, Oral, Q6H PRN, 500 mg at 09/19/18 0947 **OR** methocarbamol (ROBAXIN) 500 mg in dextrose 5 % 50 mL IVPB, 500 mg, Intravenous, Q6H PRN, Newt Minion, MD   metoCLOPramide (REGLAN) tablet 5-10 mg, 5-10 mg, Oral, Q8H PRN **OR** metoCLOPramide (REGLAN) injection 5-10 mg, 5-10 mg, Intravenous, Q8H PRN, Newt Minion, MD   ondansetron (ZOFRAN) tablet 4 mg, 4 mg, Oral, Q6H PRN **OR** ondansetron (ZOFRAN)  injection 4 mg, 4 mg, Intravenous, Q6H PRN, Newt Minion, MD   oxyCODONE (Oxy IR/ROXICODONE) immediate release tablet 10-15 mg, 10-15 mg, Oral, Q4H PRN, Newt Minion, MD, 10 mg at 09/19/18 0947   oxyCODONE (Oxy IR/ROXICODONE) immediate release tablet 5-10 mg, 5-10 mg, Oral, Q4H PRN, Newt Minion, MD, 5 mg at 09/18/18 2132   polyethylene glycol (MIRALAX / GLYCOLAX) packet 17 g, 17 g, Oral, Daily PRN, Newt Minion, MD  Patients Current Diet:     Diet Order  Diet Carb Modified Fluid consistency: Thin; Room service appropriate? Yes  Diet effective now               Precautions / Restrictions Precautions Precautions: Fall Precaution Comments: precautions/positioning reviewed with pt Restrictions Weight Bearing Restrictions: Yes RLE Weight Bearing: Non weight bearing   Has the patient had 2 or more falls or a fall with injury in the past year? No  Prior Activity Level Limited Community (1-2x/wk): using knee walker; drove  Prior Functional Level Self Care: Did the patient need help bathing, dressing, using the toilet or eating? Independent  Indoor Mobility: Did the patient need assistance with walking from room to room (with or without device)? Independent  Stairs: Did the patient need assistance with internal or external stairs (with or without device)? Independent  Functional Cognition: Did the patient need help planning regular tasks such as shopping or remembering to take medications? Independent  Home Assistive Devices / Equipment Home Assistive Devices/Equipment: Other (Comment)(scooter) Home Equipment: None  Prior Device Use: Indicate devices/aids used by the patient prior to current illness, exacerbation or injury? knee walker  Current Functional Level Cognition  Overall Cognitive Status: Within Functional Limits for tasks assessed Orientation Level: Oriented X4    Extremity Assessment (includes  Sensation/Coordination)  Upper Extremity Assessment: Overall WFL for tasks assessed  Lower Extremity Assessment: RLE deficits/detail RLE Deficits / Details: s/p R BKA. Numbness reported secondary to nerve block.     ADLs  Overall ADL's : Needs assistance/impaired Eating/Feeding: Set up, Sitting Grooming: Wash/dry hands, Wash/dry face, Oral care, Set up, Sitting Upper Body Bathing: Independent Lower Body Bathing: Min guard Lower Body Dressing: Sitting/lateral leans, Min guard Toilet Transfer: Moderate assistance, Cueing for safety, Cueing for sequencing, RW Toilet Transfer Details (indicate cue type and reason): simulated transfer from bed to recliner Functional mobility during ADLs: Moderate assistance, Cueing for safety, Cueing for sequencing, Rolling walker    Mobility  Overal bed mobility: Needs Assistance Bed Mobility: Supine to Sit Supine to sit: Supervision Sit to supine: Supervision General bed mobility comments: Supervision for safety; use of rail    Transfers  Overall transfer level: Needs assistance Equipment used: Rolling walker (2 wheeled) Transfer via Lift Equipment: Stedy Transfers: Sit to/from Stand Sit to Stand: Mod assist General transfer comment: cues for safe hand placement and positioning prior to stand; pt stood X 2 trials with assist to power up into standing and steady upon stand    Ambulation / Gait / Stairs / Wheelchair Mobility  Ambulation/Gait Ambulation/Gait assistance: Min assist, Mod assist Gait Distance (Feet): (10 ft X 2 trials) Assistive device: Rolling walker (2 wheeled) Gait Pattern/deviations: Step-to pattern General Gait Details: cues for safe use of AD and posture; assist at times for balance but no overt LOB  Gait velocity: decreased    Posture / Balance Balance Overall balance assessment: Needs assistance Sitting-balance support: No upper extremity supported, Feet supported Sitting balance-Leahy Scale: Good Standing balance  support: Bilateral upper extremity supported, During functional activity Standing balance-Leahy Scale: Poor Standing balance comment: Heavy reliance on UEs    Special needs/care consideration BiPAP/CPAP  N/a CPM  N/a Continuous Drip IV  N/a Dialysis  N/a Life Vest n/a Oxygen  N/a Special Bed  N/a Trach Size  N/a Wound Vac surgical wound vac Skin surgical incision Bowel mgmt:  Continent LBM 3/24 Bladder mgmt:  continent Diabetic mgmt: yes Behavioral consideration  N/a Chemo/radiation  N/a   Previous Home Environment  Living Arrangements: Alone  Lives With: Alone Available  Help at Discharge: Friend(s), Available PRN/intermittently Type of Home: Apartment Home Layout: One level Home Access: Level entry Bathroom Shower/Tub: Chiropodist: Standard Bathroom Accessibility: Yes How Accessible: Accessible via walker Home Care Services: No Additional Comments: Pt reports friends will come to drop him off home but may not receive much help in home setting unless he pays someone to come help.  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, Engineer, maintenance Anticipated Caregiver: friends prn only Anticipated Caregiver's Contact Information: see above Ability/Limitations of Caregiver: prn assist Caregiver Availability: Intermittent Discharge Plan Discussed with Primary Caregiver: Yes Is Caregiver In Agreement with Plan?: Yes Does Caregiver/Family have Issues with Lodging/Transportation while Pt is  in Rehab?: No  Goals/Additional Needs Patient/Family Goal for Rehab: Mod I with PT and OT Expected length of stay: ELOS 7 to 10 days Special Service Needs: patietn concerned that he has only prn asist of friends and we have to hire someone ot get his groceries, etc Pt/Family Agrees to Admission and willing to participate: Yes Program Orientation Provided & Reviewed with Pt/Caregiver Including Roles  & Responsibilities: Yes  Decrease burden of Care through IP rehab admission: n/a  Possible need for SNF placement upon discharge:  Not anticipated  Patient Condition: I have reviewed medical records from [facility] , and spoken with patient. I met with patient at the bedside  for inpatient rehabilitation assessment.  Patient will benefit from ongoing PT and OT, can actively participate in 3 hours of therapy a day 5 days of the week, and can make measurable gains during the admission.  Patient will also benefit from the coordinated team approach during an Inpatient Acute Rehabilitation admission.  The patient will receive intensive therapy as well as Rehabilitation physician, nursing, social worker, and care management interventions.  Due to bowel management, bladder management, safety, skin/wound care, disease management, medical administration, pain management, patient education the patient requires 24 hour a day rehabilitation nursing.  The patient is currently mod assist with mobility and basic ADLs.  Discharge setting and therapy post discharge at home with home health is anticipated.  Patient has agreed to participate in the Acute Inpatient Rehabilitation Program and will admit today.  Preadmission Screen Completed By:  Cleatrice Burke RN MSN, 09/19/2018 12:58 PM ______________________________________________________________________   Discussed status with Dr. Naaman Plummer  on  09/19/2018 at  1258 and received approval for admission today.  Admission Coordinator:  Cleatrice Burke, RN, time  [id number] Date  09/19/2018   Assessment/Plan: Diagnosis: right BKA 1. Does the need for close, 24 hr/day Medical supervision in concert with the patient's rehab needs make it unreasonable for this patient to be served in a less intensive setting? Yes 2. Co-Morbidities requiring supervision/potential complications: CAD, CKD, DM 3. Due to bladder management, bowel management, safety, skin/wound care, disease management, medication administration, pain management and patient education, does the patient require 24 hr/day rehab nursing? Yes 4. Does the patient require coordinated care of a physician, rehab nurse, PT (1-2 hrs/day, 5 days/week) and OT (1-2 hrs/day, 5 days/week) to address physical and functional deficits in the context of the above medical diagnosis(es)? Yes Addressing deficits in the  following areas: balance, endurance, locomotion, strength, transferring, bowel/bladder control, bathing, dressing, feeding, grooming, toileting and psychosocial support 5. Can the patient actively participate in an intensive therapy program of at least 3 hrs of therapy 5 days a week? Yes 6. The potential for patient to make measurable gains while on inpatient rehab is excellent 7. Anticipated functional outcomes upon discharge from inpatients are: modified independent PT, modified independent OT, n/a SLP 8. Estimated rehab length of stay to reach the above functional goals is: 7 days 9. Anticipated D/C setting: Home 10. Anticipated post D/C treatments: [place] therapy 11. Overall Rehab/Functional Prognosis: excellent  MD Signature: Curtis Staggers, MD, [facility] 09/19/2018         Revision History

## 2018-09-19 NOTE — Progress Notes (Signed)
Pt's chair alarm going off, RN went into room and pt had transferred himself back to bed. Pt upset alarm was on and that PT left him in the chair. RN explained to him for the second time that staff can help him back to bed that he just needs to call when he is ready. Pt refusing to have bed alarm on and again instructed to call staff if he needs help getting up for his safety. Pt verbalized understanding.

## 2018-09-19 NOTE — Progress Notes (Signed)
Subjective: 2 Days Post-Op Procedure(s) (LRB): RIGHT AMPUTATION BELOW KNEE (Right) Application Of Wound Vac (Right) Patient reports pain as moderate.   Reports he feels overall better and his blood sugars are better following the amputation.  Objective: Vital signs in last 24 hours: Temp:  [98.8 F (37.1 C)] 98.8 F (37.1 C) (03/27 0457) Pulse Rate:  [93-95] 95 (03/27 0457) Resp:  [16-18] 18 (03/27 0457) BP: (133-143)/(70-85) 133/70 (03/27 0457) SpO2:  [92 %-95 %] 95 % (03/27 0457)  Intake/Output from previous day: 03/26 0701 - 03/27 0700 In: 693.2 [P.O.:600; I.V.:93.2] Out: 1400 [Urine:1400] Intake/Output this shift: Total I/O In: -  Out: 175 [Urine:175]  Recent Labs    09/17/18 0807  HGB 14.7   Recent Labs    09/17/18 0807  WBC 14.2*  RBC 4.86  HCT 45.7  PLT 369   Recent Labs    09/17/18 0807  NA 136  K 4.1  CL 104  CO2 22  BUN 21*  CREATININE 1.46*  GLUCOSE 105*  CALCIUM 8.5*   No results for input(s): LABPT, INR in the last 72 hours.  Right transtibial amputation site with VAC dressing in place, no drainage in canister.    Assessment/Plan: 2 Days Post-Op Procedure(s) (LRB): RIGHT AMPUTATION BELOW KNEE (Right) Application Of Wound Vac (Right) Up with therapies Continue VAC dressing.  CIR admit pending.   Lazaro Arms, PA-C 09/19/2018, 8:15 AM  [facility] [phone]

## 2018-09-19 NOTE — PMR Pre-admission (Signed)
PMR Admission Coordinator Pre-Admission Assessment  Patient: Curtis Bradford is an 58 y.o., male MRN: [id number] DOB: 07-21-1960 Height: '6\' 2"'$  (188 cm) Weight: 90.7 kg  Insurance Information HMO:     PPO:      PCP:      IPA:      80/20:      OTHER:  PRIMARY: [employer]      Policy#: [id number]      Subscriber: pt CM Name: Butch Penny      Phone#: [phone]     Fax#: [phone] Pre-Cert#: [id number] approved 3/27 until 4/2 when updates are due to [place] phone [phone] fax [phone]      Employer: [employer] Benefits:  Phone #: [phone]     Name: 3/26 Eff. Date: 06/25/2018     Deduct: $1300      Out of Pocket Max: $3200      Life Max: none CIR: 80%      SNF: 80% with 90 days Outpatient: 80%     Co-Pay: 180 visits per calender year Home Health: 80%      Co-Pay: visits per medical neccesity DME: 80%     Co-Pay: 20% Providers: in network  SECONDARY: none      Medicaid Application Date:       Case Manager:  Disability Application Date:       Case Worker:   Emergency Contact Information Contact Information    Name Relation Home Work Devon Mother [phone]  [phone]      Current Medical History  Patient Admitting Diagnosis: BKA  History of Present Illness: Derrall Hicks a 58 year old right-handed male with history of CAD with stenting maintained on aspirin and Plavix,CKD stage III, diabetes mellitus, hypertension,CVA 2012, tobacco abuse, seizure disorder maintained on Keppra. 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 Per chart review patient lives alone. One level apartment. Friends check on him routinely. Presented 09/17/2018 with progressive dehiscence ischemic changes of recent right TMA amputation. 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 Sharol Given with application of wound VAC. Hospital course pain management. Patient remains on aspirin and Plavix as prior to  admission.   Patient's medical record from [facility] has been reviewed by the rehabilitation admission coordinator and physician.  Past Medical History  Past Medical History:  Diagnosis Date  . CAD (coronary artery disease)   . CKD (chronic kidney disease), stage III ([place])   . 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 History of DVT (deep vein thrombosis)   . History of kidney stones    passed stones - no surgery  . HLD (hyperlipidemia)   . Myocardial infarction ([place]) 2009  . Peripheral vascular disease ([facility])    right leg  . Seizure ([place])    last one 2015  . Stroke [facility]) 2012, 10/2017   2012-speech was effected- has come back.10/2017- numbness of right side no taste right  side. 2012-  . Tobacco abuse     Family History   family history includes Diabetes Mellitus II in his father and mother.  Prior Rehab/Hospitalizations Has the patient had prior rehab or hospitalizations prior to admission? Yes  Has the patient had major surgery during 100 days prior to admission? Yes   Current Medications  Current Facility-Administered Medications:  .  0.9 %  sodium chloride infusion, , Intravenous, Continuous, Newt Minion, MD, Stopped at 09/18/18 [phone] .  acetaminophen (TYLENOL) tablet  325-650 mg, 325-650 mg, Oral, Q6H PRN, Newt Minion, MD, 650 mg at 09/18/18 2132 .  aspirin EC tablet 81 mg, 81 mg, Oral, Daily, Newt Minion, MD, 81 mg at 09/19/18 0947 .  atorvastatin (LIPITOR) tablet 20 mg, 20 mg, Oral, Daily, Newt Minion, MD, 20 mg at 09/18/18 1647 .  bisacodyl (DULCOLAX) suppository 10 mg, 10 mg, Rectal, Daily PRN, Newt Minion, MD .  clopidogrel (PLAVIX) tablet 75 mg, 75 mg, Oral, QHS, Newt Minion, MD, 75 mg at 09/18/18 2132 .  docusate sodium (COLACE) capsule 100 mg, 100 mg, Oral, BID, Newt Minion, MD, 100 mg at 09/19/18 0948 .  ezetimibe (ZETIA) tablet 10 mg, 10 mg, Oral, Daily, Newt Minion, MD, 10 mg at 09/18/18 1647 .  gabapentin  (NEURONTIN) capsule 300 mg, 300 mg, Oral, TID, Newt Minion, MD, 300 mg at 09/19/18 0947 .  HYDROcodone-acetaminophen (NORCO/VICODIN) 5-325 MG per tablet 1 tablet, 1 tablet, Oral, Q6H PRN, Newt Minion, MD .  HYDROmorphone (DILAUDID) injection 0.5-1 mg, 0.5-1 mg, Intravenous, Q4H PRN, Newt Minion, MD, 1 mg at 09/19/18 0525 .  insulin aspart (novoLOG) injection 0-15 Units, 0-15 Units, Subcutaneous, TID WC, Newt Minion, MD, 2 Units at 09/19/18 1220 .  insulin aspart (novoLOG) injection 4 Units, 4 Units, Subcutaneous, TID WC, Newt Minion, MD, 4 Units at 09/17/18 1808 .  insulin glargine (LANTUS) injection 30 Units, 30 Units, Subcutaneous, QHS, Newt Minion, MD, 30 Units at 09/18/18 2132 .  levETIRAcetam (KEPPRA XR) 24 hr tablet 500 mg, 500 mg, Oral, Daily, Newt Minion, MD, 500 mg at 09/18/18 1734 .  linagliptin (TRADJENTA) tablet 5 mg, 5 mg, Oral, Daily, Newt Minion, MD, 5 mg at 09/19/18 0947 .  magnesium citrate solution 1 Bottle, 1 Bottle, Oral, Once PRN, Newt Minion, MD .  methocarbamol (ROBAXIN) tablet 500 mg, 500 mg, Oral, Q6H PRN, 500 mg at 09/19/18 0947 **OR** methocarbamol (ROBAXIN) 500 mg in dextrose 5 % 50 mL IVPB, 500 mg, Intravenous, Q6H PRN, Newt Minion, MD .  metoCLOPramide (REGLAN) tablet 5-10 mg, 5-10 mg, Oral, Q8H PRN **OR** metoCLOPramide (REGLAN) injection 5-10 mg, 5-10 mg, Intravenous, Q8H PRN, Newt Minion, MD .  ondansetron (ZOFRAN) tablet 4 mg, 4 mg, Oral, Q6H PRN **OR** ondansetron (ZOFRAN) injection 4 mg, 4 mg, Intravenous, Q6H PRN, Newt Minion, MD .  oxyCODONE (Oxy IR/ROXICODONE) immediate release tablet 10-15 mg, 10-15 mg, Oral, Q4H PRN, Newt Minion, MD, 10 mg at 09/19/18 0947 .  oxyCODONE (Oxy IR/ROXICODONE) immediate release tablet 5-10 mg, 5-10 mg, Oral, Q4H PRN, Newt Minion, MD, 5 mg at 09/18/18 2132 .  polyethylene glycol (MIRALAX / GLYCOLAX) packet 17 g, 17 g, Oral, Daily PRN, Newt Minion, MD  Patients Current Diet:  Diet Order             Diet Carb Modified Fluid consistency: Thin; Room service appropriate? Yes  Diet effective now              Precautions / Restrictions Precautions Precautions: Fall Precaution Comments: precautions/positioning reviewed with pt Restrictions Weight Bearing Restrictions: Yes RLE Weight Bearing: Non weight bearing   Has the patient had 2 or more falls or a fall with injury in the past year? No  Prior Activity Level Limited Community (1-2x/wk): using knee walker; drove  Prior Functional Level Self Care: Did the patient need help bathing, dressing, using the toilet or eating? Independent  Indoor Mobility: Did  the patient need assistance with walking from room to room (with or without device)? Independent  Stairs: Did the patient need assistance with internal or external stairs (with or without device)? Independent  Functional Cognition: Did the patient need help planning regular tasks such as shopping or remembering to take medications? Independent  Home Assistive Devices / Equipment Home Assistive Devices/Equipment: Other (Comment)(scooter) Home Equipment: None  Prior Device Use: Indicate devices/aids used by the patient prior to current illness, exacerbation or injury? knee walker  Current Functional Level Cognition  Overall Cognitive Status: Within Functional Limits for tasks assessed Orientation Level: Oriented X4    Extremity Assessment (includes Sensation/Coordination)  Upper Extremity Assessment: Overall WFL for tasks assessed  Lower Extremity Assessment: RLE deficits/detail RLE Deficits / Details: s/p R BKA. Numbness reported secondary to nerve block.     ADLs  Overall ADL's : Needs assistance/impaired Eating/Feeding: Set up, Sitting Grooming: Wash/dry hands, Wash/dry face, Oral care, Set up, Sitting Upper Body Bathing: Independent Lower Body Bathing: Min guard Lower Body Dressing: Sitting/lateral leans, Min guard Toilet Transfer: Moderate assistance,  Cueing for safety, Cueing for sequencing, RW Toilet Transfer Details (indicate cue type and reason): simulated transfer from bed to recliner Functional mobility during ADLs: Moderate assistance, Cueing for safety, Cueing for sequencing, Rolling walker    Mobility  Overal bed mobility: Needs Assistance Bed Mobility: Supine to Sit Supine to sit: Supervision Sit to supine: Supervision General bed mobility comments: Supervision for safety; use of rail    Transfers  Overall transfer level: Needs assistance Equipment used: Rolling walker (2 wheeled) Transfer via Lift Equipment: Stedy Transfers: Sit to/from Stand Sit to Stand: Mod assist General transfer comment: cues for safe hand placement and positioning prior to stand; pt stood X 2 trials with assist to power up into standing and steady upon stand    Ambulation / Gait / Stairs / Wheelchair Mobility  Ambulation/Gait Ambulation/Gait assistance: Min assist, Mod assist Gait Distance (Feet): (10 ft X 2 trials) Assistive device: Rolling walker (2 wheeled) Gait Pattern/deviations: Step-to pattern General Gait Details: cues for safe use of AD and posture; assist at times for balance but no overt LOB  Gait velocity: decreased    Posture / Balance Balance Overall balance assessment: Needs assistance Sitting-balance support: No upper extremity supported, Feet supported Sitting balance-Leahy Scale: Good Standing balance support: Bilateral upper extremity supported, During functional activity Standing balance-Leahy Scale: Poor Standing balance comment: Heavy reliance on UEs    Special needs/care consideration BiPAP/CPAP  N/a CPM  N/a Continuous Drip IV  N/a Dialysis  N/a Life Vest n/a Oxygen  N/a Special Bed  N/a Trach Size  N/a Wound Vac surgical wound vac Skin surgical incision Bowel mgmt:  Continent LBM 3/24 Bladder mgmt:  continent Diabetic mgmt: yes Behavioral consideration  N/a Chemo/radiation  N/a   Previous Home  Environment  Living Arrangements: Alone  Lives With: Alone Available Help at Discharge: Friend(s), Available PRN/intermittently Type of Home: Apartment Home Layout: One level Home Access: Level entry Bathroom Shower/Tub: Chiropodist: Standard Bathroom Accessibility: Yes How Accessible: Accessible via walker Home Care Services: No Additional Comments: Pt reports friends will come to drop him off home but may not receive much help in home setting unless he pays someone to come help.  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, Engineer, maintenance Anticipated Caregiver: friends prn only Anticipated Caregiver's Contact Information: see above Ability/Limitations of Caregiver: prn assist Caregiver Availability: Intermittent Discharge Plan Discussed with Primary Caregiver: Yes Is Caregiver In Agreement with Plan?: Yes Does Caregiver/Family have Issues with Lodging/Transportation while Pt is in Rehab?: No  Goals/Additional Needs Patient/Family Goal for Rehab: Mod I with PT and OT Expected length of stay: ELOS 7 to 10 days Special Service Needs: patietn concerned that he has only prn asist of friends and we have to hire someone ot get his groceries, etc Pt/Family Agrees to Admission and willing to participate: Yes Program Orientation Provided & Reviewed with Pt/Caregiver Including Roles  & Responsibilities: Yes  Decrease burden of Care through IP rehab admission: n/a  Possible need for SNF placement upon  discharge:  Not anticipated  Patient Condition: I have reviewed medical records from [facility] , and spoken with patient. I met with patient at the bedside  for inpatient rehabilitation assessment.  Patient will benefit from ongoing PT and OT, can actively participate in 3 hours of therapy a day 5 days of the week, and can make measurable gains during the admission.  Patient will also benefit from the coordinated team approach during an Inpatient Acute Rehabilitation admission.  The patient will receive intensive therapy as well as Rehabilitation physician, nursing, social worker, and care management interventions.  Due to bowel management, bladder management, safety, skin/wound care, disease management, medical administration, pain management, patient education the patient requires 24 hour a day rehabilitation nursing.  The patient is currently mod assist with mobility and basic ADLs.  Discharge setting and therapy post discharge at home with home health is anticipated.  Patient has agreed to participate in the Acute Inpatient Rehabilitation Program and will admit today.  Preadmission Screen Completed By:  Cleatrice Burke RN MSN, 09/19/2018 12:58 PM ______________________________________________________________________   Discussed status with Dr. Naaman Plummer  on  09/19/2018 at  1258 and received approval for admission today.  Admission Coordinator:  Cleatrice Burke, RN, time  9373 Date  09/19/2018   Assessment/Plan: Diagnosis: right BKA 1. Does the need for close, 24 hr/day Medical supervision in concert with the patient's rehab needs make it unreasonable for this patient to be served in a less intensive setting? Yes 2. Co-Morbidities requiring supervision/potential complications: CAD, CKD, DM 3. Due to bladder management, bowel management, safety, skin/wound care, disease management, medication administration, pain management and patient education, does the patient require 24 hr/day  rehab nursing? Yes 4. Does the patient require coordinated care of a physician, rehab nurse, PT (1-2 hrs/day, 5 days/week) and OT (1-2 hrs/day, 5 days/week) to address physical and functional deficits in the context of the above medical diagnosis(es)? Yes Addressing deficits in the following areas: balance, endurance, locomotion, strength, transferring, bowel/bladder control, bathing, dressing, feeding, grooming, toileting and psychosocial support 5. Can the patient actively participate in an intensive therapy program of at least 3 hrs of therapy 5 days a week? Yes 6. The potential for patient to make measurable gains while on inpatient rehab is excellent 7. Anticipated functional outcomes upon discharge from inpatients are: modified independent PT, modified independent OT, n/a SLP 8. Estimated rehab length of stay to reach the above functional goals is: 7 days 9. Anticipated D/C setting: Home 10. Anticipated post D/C treatments: [place] therapy 11. Overall Rehab/Functional Prognosis: excellent  MD Signature: [name], MD, [facility] 09/19/2018

## 2018-09-19 NOTE — Progress Notes (Signed)
Physical Therapy Treatment Patient Details Name: Curtis Bradford MRN: [id number] DOB: 02-Apr-1961 Today's Date: 09/19/2018    History of Present Illness Pt is a 58 y/o male s/p R BKA secondary to complications of dehiscence of transmet amputation. PMH includes CKD, CAD, DVT, MI, PVD, seizure, and CVA.     PT Comments    Patient continues to make progress toward PT goals. Current plan remains appropriate.    Follow Up Recommendations  CIR     Equipment Recommendations  Other (comment)(TBD)    Recommendations for Other Services       Precautions / Restrictions Precautions Precautions: Fall Precaution Comments: precautions/positioning reviewed with pt Restrictions Weight Bearing Restrictions: Yes RLE Weight Bearing: Non weight bearing    Mobility  Bed Mobility Overal bed mobility: Needs Assistance Bed Mobility: Supine to Sit     Supine to sit: Supervision     General bed mobility comments: Supervision for safety; use of rail  Transfers Overall transfer level: Needs assistance Equipment used: Rolling walker (2 wheeled) Transfers: Sit to/from Stand Sit to Stand: Mod assist         General transfer comment: cues for safe hand placement and positioning prior to stand with some carry over; assist to power up into standing and to steady  Ambulation/Gait Ambulation/Gait assistance: Min assist;Mod assist(chair follow) Gait Distance (Feet): 50 Feet Assistive device: Rolling walker (2 wheeled) Gait Pattern/deviations: Step-to pattern Gait velocity: decreased   General Gait Details: standing rest breaks required; cues for maintaining safe proximity to RW, sequencing, and gait speed; pt has tendency to try to go a little too fast and needs cues for safety   Stairs             Wheelchair Mobility    Modified Rankin (Stroke Patients Only)       Balance Overall balance assessment: Needs assistance Sitting-balance support: No upper extremity supported;Feet  supported Sitting balance-Leahy Scale: Good     Standing balance support: Bilateral upper extremity supported;During functional activity Standing balance-Leahy Scale: Poor                              Cognition Arousal/Alertness: Awake/alert Behavior During Therapy: WFL for tasks assessed/performed Overall Cognitive Status: Within Functional Limits for tasks assessed                                        Exercises      General Comments        Pertinent Vitals/Pain Pain Assessment: Faces Faces Pain Scale: Hurts little more Pain Location: RLE Pain Descriptors / Indicators: Sore Pain Intervention(s): Limited activity within patient's tolerance;Monitored during session;Premedicated before session;Repositioned    Home Living                      Prior Function            PT Goals (current goals can now be found in the care plan section) Acute Rehab PT Goals Patient Stated Goal: to get a prosthetic and get back to work Progress towards PT goals: Progressing toward goals    Frequency    Min 3X/week      PT Plan Current plan remains appropriate    Co-evaluation              AM-PAC PT "6 Clicks" Mobility   Outcome Measure  Help needed turning from your back to your side while in a flat bed without using bedrails?: A Little Help needed moving from lying on your back to sitting on the side of a flat bed without using bedrails?: A Little Help needed moving to and from a bed to a chair (including a wheelchair)?: A Little Help needed standing up from a chair using your arms (e.g., wheelchair or bedside chair)?: A Little Help needed to walk in hospital room?: A Lot Help needed climbing 3-5 steps with a railing? : A Lot 6 Click Score: 16    End of Session Equipment Utilized During Treatment: Gait belt Activity Tolerance: Patient tolerated treatment well Patient left: with call bell/phone within reach;in chair Nurse  Communication: Mobility status PT Visit Diagnosis: Difficulty in walking, not elsewhere classified (R26.2);Unsteadiness on feet (R26.81);Muscle weakness (generalized) (M62.81)     Time: 1010-1046 PT Time Calculation (min) (ACUTE ONLY): 36 min  Charges:  $Gait Training: 23-37 mins                     Erline Levine, PTA Acute Rehabilitation Services Pager: [phone] Office: [phone]     Carolynne Edouard 09/19/2018, 1:38 PM

## 2018-09-19 NOTE — H&P (Signed)
Physical Medicine and Rehabilitation Admission H&P    Chief complaint: stump pain  HPI: Curtis Bradford a 58 year old right-handed male with history of CAD with stenting maintained on aspirin and Plavix,CKD stage III, diabetes mellitus, hypertension,CVA 2012, tobacco abuse, seizure disorder maintained on Keppra. 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 Per chart review patient lives alone. One level apartment. Friends check on him routinely. Presented 09/17/2018 with progressive dehiscence ischemic changes of recent right TMA amputation. 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 Lajoyce Corners with application of wound VAC. Hospital course pain management. Patient remains on aspirin and Plavix as prior to admission. Therapy evaluations completed with recommendations of physical medicine rehabilitation consult. Patient was admitted for a comprehensive rehabilitation program.  Review of Systems  Constitutional: Negative for chills and fever.  HENT: Negative for hearing loss.   Eyes: Negative for blurred vision and double vision.  Cardiovascular: Positive for leg swelling. Negative for chest pain and palpitations.  Gastrointestinal: Positive for constipation. Negative for heartburn, nausea and vomiting.  Genitourinary: Positive for urgency. Negative for dysuria, flank pain and hematuria.  Musculoskeletal: Positive for joint pain and myalgias.  Skin: Negative for rash.  Neurological: Positive for seizures and weakness.  Psychiatric/Behavioral: The patient has insomnia.   All other systems reviewed and are negative.  Past Medical History:  Diagnosis Date  . CAD (coronary artery disease)   . CKD (chronic kidney disease), stage III (HCC)   . 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 History of DVT (deep vein thrombosis)   . History of kidney stones    passed stones - no surgery  . HLD (hyperlipidemia)   .  Myocardial infarction (HCC) 2009  . Peripheral vascular disease (HCC)    right leg  . Seizure (HCC)    last one 2015  . Stroke [facility]) 2012, 10/2017   2012-speech was effected- has come back.10/2017- numbness of right side no taste right  side. 2012-  . Tobacco abuse    Past Surgical History:  Procedure Laterality Date  . AMPUTATION Right 02/23/2017   Procedure: RIGHT FOOT 5TH RAY AMPUTATION;  Surgeon: [name], MD;  Location: [facility] OR;  Service: Orthopedics;  Laterality: Right;  . AMPUTATION Right 04/23/2018   Procedure: Right Great Toe Amputation;  Surgeon: [name], MD;  Location: [facility] OR;  Service: Orthopedics;  Laterality: Right;  . AMPUTATION Right 07/23/2018   Procedure: RIGHT TRANSMETATARSAL AMPUTATION, APPLY WOUND VAC;  Surgeon: [name], MD;  Location: [facility] OR;  Service: Orthopedics;  Laterality: Right;  . AMPUTATION Right 09/17/2018   Procedure: RIGHT AMPUTATION BELOW KNEE;  Surgeon: [name], MD;  Location: [facility] OR;  Service: Orthopedics;  Laterality: Right;  . APPLICATION OF WOUND VAC Right 09/17/2018   Procedure: Application Of Wound Vac;  Surgeon: [name], MD;  Location: [facility] OR;  Service: Orthopedics;  Laterality: Right;  . BELOW KNEE LEG AMPUTATION Right 09/17/2018  . CORONARY ANGIOPLASTY  03/05/2008   stent  . FOOT SURGERY    . PERIPHERAL ARTERIAL STENT GRAFT Right 2014   leg 2 stents- clotted per pat  . STENT PLACEMENT VASCULAR ([facility] HX)     Several in right leg  . TONSILLECTOMY    . TRANSMETATARSAL AMPUTATION Right 07/23/2018   Family History  Problem Relation Age of Onset  . Diabetes Mellitus II Mother   . Diabetes Mellitus II Father    Social History:  reports that he has been smoking cigarettes. He has a 3.75 pack-year smoking history. He has never used smokeless tobacco. He reports previous alcohol use. He reports that he does not use drugs. Allergies: No Known Allergies Medications Prior to Admission  Medication Sig Dispense Refill  .  aspirin EC 81 MG tablet Take 81 mg by mouth daily.     Marland Kitchen atorvastatin (LIPITOR) 20 MG tablet Take 1 tablet (20 mg total) by mouth daily. (Patient taking differently: Take 20 mg by mouth daily at 6 PM. ) 90 tablet 3  . clopidogrel (PLAVIX) 75 MG tablet Take 1 tablet (75 mg total) by mouth at bedtime. (Patient taking differently: Take 75 mg by mouth daily. ) 90 tablet 4  . ezetimibe (ZETIA) 10 MG tablet Take 1 tablet (10 mg total) by mouth daily. (Patient taking differently: Take 10 mg by mouth daily at 6 PM. ) 90 tablet 3  . HYDROcodone-acetaminophen (NORCO/VICODIN) 5-325 MG tablet Take 1 tablet by mouth every 6 (six) hours as needed for moderate pain. 20 tablet 0  . insulin glargine (LANTUS) 100 unit/mL SOPN Inject 0.37 mLs (37 Units total) into the skin at bedtime. (Patient taking differently: Inject 37 Units into the skin daily. Pt's bedtime is morning) 15 mL 3  . insulin lispro (HUMALOG KWIKPEN) 100 UNIT/ML KiwkPen Inject 0-0.15 mLs (0-15 Units total) into the skin 2 (two) times daily. Typically takes 4-6 units per meal. Per sliding scale (Patient taking differently: Inject 4-12 Units into the skin 3 (three) times daily as needed (high blood sugar). ) 15 mL 11  . levETIRAcetam (KEPPRA XR) 500 MG 24 hr tablet Take 1 tablet (500 mg total) by mouth daily. (Patient taking differently: Take 500 mg by mouth every morning. ) 90 tablet 3  . linagliptin (TRADJENTA) 5 MG TABS tablet Take 1 tablet (5 mg total) by mouth daily. (Patient taking differently: Take 5 mg by mouth daily at 6 PM. ) 30 tablet 3  . clindamycin (CLEOCIN) 300 MG capsule Take 1 capsule (300 mg total) by mouth 3 (three) times daily. (Patient not taking: Reported on 09/12/2018) 40 capsule 0  . Continuous Blood Gluc Sensor (FREESTYLE LIBRE 14 DAY SENSOR) MISC Place 1 patch onto the skin every 14 (fourteen) days. 6 each 2  . nitroGLYCERIN (NITRODUR - DOSED IN MG/24 HR) 0.4 mg/hr patch Place 1 patch (0.4 mg total) onto the skin daily. (Patient not  taking: Reported on 09/12/2018) 30 patch 12  . oxyCODONE-acetaminophen (PERCOCET/ROXICET) 5-325 MG tablet Take 1 tablet by mouth every 6 (six) hours as needed for moderate pain or severe pain. (Patient not taking: Reported on 09/12/2018) 30 tablet 0  . pentoxifylline (TRENTAL) 400 MG CR tablet Take 1 tablet (400 mg total) by mouth 3 (three) times daily with meals. (Patient not taking: Reported on 09/12/2018) 90 tablet 3  . sulfamethoxazole-trimethoprim (BACTRIM DS,SEPTRA DS) 800-160 MG tablet Take 1 tablet by mouth 2 (two) times daily. (Patient not taking: Reported on 09/12/2018) 28 tablet 0    Drug Regimen Review Drug regimen was reviewed and remains appropriate with no significant issues identified  Home: Home Living Family/patient expects to be discharged to:: Private residence Living Arrangements: Alone Available Help at Discharge: Friend(s), Available PRN/intermittently Type of Home: Apartment Home Access: Level entry Home Layout: One level Bathroom Shower/Tub: Engineer, manufacturing systems: Standard Home Equipment: None Additional Comments: Pt reports friends will come to drop him off home but may not receive much help in home setting unless he pays someone to  come help.   Functional History: Prior Function Level of Independence: Independent with assistive device(s) Comments: Was using knee scooter for mobility  Functional Status:  Mobility: Bed Mobility Overal bed mobility: Needs Assistance Bed Mobility: Supine to Sit Supine to sit: Supervision Sit to supine: Supervision General bed mobility comments: Supervision for safety; use of rail Transfers Overall transfer level: Needs assistance Equipment used: Rolling walker (2 wheeled) Transfer via Lift Equipment: Stedy Transfers: Sit to/from Stand Sit to Stand: Mod assist General transfer comment: cues for safe hand placement and positioning prior to stand; pt stood X 2 trials with assist to power up into standing and steady  upon stand Ambulation/Gait Ambulation/Gait assistance: Min assist, Mod assist Gait Distance (Feet): (10 ft X 2 trials) Assistive device: Rolling walker (2 wheeled) Gait Pattern/deviations: Step-to pattern General Gait Details: cues for safe use of AD and posture; assist at times for balance but no overt LOB  Gait velocity: decreased    ADL: ADL Overall ADL's : Needs assistance/impaired Eating/Feeding: Set up, Sitting Grooming: Wash/dry hands, Wash/dry face, Oral care, Set up, Sitting Upper Body Bathing: Independent Lower Body Bathing: Min guard Lower Body Dressing: Sitting/lateral leans, Min guard Toilet Transfer: Moderate assistance, Cueing for safety, Cueing for sequencing, RW Toilet Transfer Details (indicate cue type and reason): simulated transfer from bed to recliner Functional mobility during ADLs: Moderate assistance, Cueing for safety, Cueing for sequencing, Rolling walker  Cognition: Cognition Overall Cognitive Status: Within Functional Limits for tasks assessed Orientation Level: Oriented X4 Cognition Arousal/Alertness: Awake/alert Behavior During Therapy: WFL for tasks assessed/performed Overall Cognitive Status: Within Functional Limits for tasks assessed  Physical Exam: Blood pressure 133/70, pulse 95, temperature 98.8 F (37.1 C), temperature source Oral, resp. rate 18, height  (1.88 m), weight 90.7 kg, SpO2 95 %. Physical Exam  Vitals reviewed. Constitutional: He is oriented to person, place, and time. He appears well-developed and well-nourished.  HENT:  Head: Normocephalic and atraumatic.  Eyes: Pupils are equal, round, and reactive to light. EOM are normal.  Neck: Normal range of motion. No thyromegaly present.  Cardiovascular: Normal rate and regular rhythm. Exam reveals no friction rub.  No murmur heard. Respiratory: Effort normal and breath sounds normal. No respiratory distress. He has no wheezes.  GI: Soft. He exhibits no distension. There is  no abdominal tenderness.  Musculoskeletal:     Comments: Right leg with expected edema, tender to palpation  Neurological: He is alert and oriented to person, place, and time.  UE 5/5. LLE 4/5 prox to distal. Able to lift RLE off bed and can bend knee.  Skin:  BKA site is dressed with wound VAC in place to suction  Psychiatric:  Pt very anxious, tangential, difficult to redirect    Results for orders placed or performed during the hospital encounter of 09/17/18 (from the past 48 hour(s))  Glucose, capillary     Status: Abnormal   Collection Time: 09/17/18  7:32 AM  Result Value Ref Range   Glucose-Capillary 119 (H) 70 - 99 mg/dL  Basic metabolic panel     Status: Abnormal   Collection Time: 09/17/18  8:07 AM  Result Value Ref Range   Sodium 136 135 - 145 mmol/L   Potassium 4.1 3.5 - 5.1 mmol/L   Chloride 104 98 - 111 mmol/L   CO2 22 22 - 32 mmol/L   Glucose, Bld 105 (H) 70 - 99 mg/dL   BUN 21 (H) 6 - 20 mg/dL   Creatinine, Ser 1.61 (H) 0.61 - 1.24  mg/dL   Calcium 8.5 (L) 8.9 - 10.3 mg/dL   GFR calc non Af Amer 53 (L) >60 mL/min   GFR calc Af Amer >60 >60 mL/min   Anion gap 10 5 - 15    Comment: Performed at [facility], [address] [place], [place] [address]  CBC     Status: Abnormal   Collection Time: 09/17/18  8:07 AM  Result Value Ref Range   WBC 14.2 (H) 4.0 - 10.5 K/uL   RBC 4.86 4.22 - 5.81 MIL/uL   Hemoglobin 14.7 13.0 - 17.0 g/dL   HCT 69.4 85.4 - 62.7 %   MCV 94.0 80.0 - 100.0 fL   MCH 30.2 26.0 - 34.0 pg   MCHC 32.2 30.0 - 36.0 g/dL   RDW 03.5 00.9 - 38.1 %   Platelets 369 150 - 400 K/uL   nRBC 0.0 0.0 - 0.2 %    Comment: Performed at [facility], [address] [place], [place] [address]  Hemoglobin A1c     Status: Abnormal   Collection Time: 09/17/18  8:07 AM  Result Value Ref Range   Hgb A1c MFr Bld 7.5 (H) 4.8 - 5.6 %    Comment: (NOTE) Pre diabetes:          5.7%-6.4% Diabetes:              >6.4% Glycemic control for   <7.0%  adults with diabetes    Mean Plasma Glucose 168.55 mg/dL    Comment: Performed at [facility], [address], [place], [place] [address]  Glucose, capillary     Status: None   Collection Time: 09/17/18  9:29 AM  Result Value Ref Range   Glucose-Capillary 88 70 - 99 mg/dL   Comment 1 Notify RN    Comment 2 Document in Chart   Glucose, capillary     Status: None   Collection Time: 09/17/18 10:42 AM  Result Value Ref Range   Glucose-Capillary 97 70 - 99 mg/dL  Glucose, capillary     Status: Abnormal   Collection Time: 09/17/18  5:55 PM  Result Value Ref Range   Glucose-Capillary 144 (H) 70 - 99 mg/dL  Glucose, capillary     Status: Abnormal   Collection Time: 09/17/18  9:05 PM  Result Value Ref Range   Glucose-Capillary 136 (H) 70 - 99 mg/dL  Glucose, capillary     Status: None   Collection Time: 09/18/18  8:17 AM  Result Value Ref Range   Glucose-Capillary 82 70 - 99 mg/dL  Glucose, capillary     Status: None   Collection Time: 09/18/18 11:22 AM  Result Value Ref Range   Glucose-Capillary 97 70 - 99 mg/dL  Glucose, capillary     Status: Abnormal   Collection Time: 09/18/18  4:53 PM  Result Value Ref Range   Glucose-Capillary 127 (H) 70 - 99 mg/dL  Glucose, capillary     Status: Abnormal   Collection Time: 09/18/18  9:02 PM  Result Value Ref Range   Glucose-Capillary 111 (H) 70 - 99 mg/dL   No results found.     Medical Problem List and Plan: 1.  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 Pt very concerned about job and being able to return to work place 2.  Antithrombotics: -DVT/anticoagulation: SCD left lower  extremity   -antiplatelet therapy: aspirin 81 mg daily, Plavix 75 mg daily 3. Pain Management: Neurontin 300 mg 3 times a day  - oxycodone and Robaxin as  needed 4. Mood:  Provide emotional support  -antipsychotic agents: N/A 5. Neuropsych: This patient is capable of making decisions on his own behalf. 6. Skin/Wound Care:  Routine skin checks 7. Fluids/Electrolytes/Nutrition:  Routine ins and outs with follow-up chemistries 8. Diabetes mellitus. Hemoglobin A1c 7.5.   -Lantus insulin 30 units daily at bedtime  -Tradjenta 5 mg daily.   -Check blood sugars before meals and at bedtime 9. CAD with stenting. Continue aspirin and Plavix. No chest pain or shortness of breath 10. History of seizures. Keppra 500 mg daily. 11.CKD stage III. Creatinine baseline 1.35-1.80. Follow-up chemistries 12. Hyperlipidemia.Zetia10 mg daily, Lipitor 20 mg daily 13. History of tobacco abuse. Provide counseling 14. Constipation. Laxative assistance      Charlton Amor, PA-C 09/19/2018

## 2018-09-19 NOTE — Discharge Summary (Signed)
Discharge Diagnoses:  Active Problems:   Gangrene of right foot (HCC)   Below knee amputation (HCC)   Surgeries: Procedure(s): RIGHT AMPUTATION BELOW KNEE Application Of Wound Vac on 09/17/2018    Consultants:   Discharged Condition: Improved  Hospital Course: Curtis Bradford is an 58 y.o. male who was admitted 09/17/2018 with a chief complaint of dehiscence right foot wound, with a final diagnosis of dehiscence, with osteomyelitis right foot.  Patient was brought to the operating room on 09/17/2018 and underwent Procedure(s): RIGHT AMPUTATION BELOW KNEE Application Of Wound Vac.    Patient was given perioperative antibiotics:  Anti-infectives (From admission, onward)   Start     Dose/Rate Route Frequency Ordered Stop   09/17/18 1315  ceFAZolin (ANCEF) IVPB 1 g/50 mL premix     1 g 100 mL/hr over 30 Minutes Intravenous Every 6 hours 09/17/18 1302 09/18/18 0450   09/17/18 0715  ceFAZolin (ANCEF) IVPB 2g/100 mL premix     2 g 200 mL/hr over 30 Minutes Intravenous On call to O.R. 09/17/18 2811 09/17/18 1004    .  Patient was given sequential compression devices, early ambulation, and aspirin for DVT prophylaxis.  Recent vital signs:  Patient Vitals for the past 24 hrs:  BP Temp Temp src Pulse Resp SpO2  09/19/18 0457 133/70 98.8 F (37.1 C) Oral 95 18 95 %  09/18/18 1957 (!) 143/85 98.8 F (37.1 C) Oral 93 16 92 %  .  Recent laboratory studies: No results found.  Discharge Medications:   Allergies as of 09/19/2018   No Known Allergies     Medication List    STOP taking these medications   clindamycin 300 MG capsule Commonly known as:  Cleocin   HYDROcodone-acetaminophen 5-325 MG tablet Commonly known as:  NORCO/VICODIN   nitroGLYCERIN 0.4 mg/hr patch Commonly known as:  NITRODUR - Dosed in mg/24 hr   oxyCODONE-acetaminophen 5-325 MG tablet Commonly known as:  PERCOCET/ROXICET   sulfamethoxazole-trimethoprim 800-160 MG tablet Commonly known as:  BACTRIM DS,SEPTRA  DS     TAKE these medications   aspirin EC 81 MG tablet Take 81 mg by mouth daily.   atorvastatin 20 MG tablet Commonly known as:  Lipitor Take 1 tablet (20 mg total) by mouth daily. What changed:  when to take this   clopidogrel 75 MG tablet Commonly known as:  PLAVIX Take 1 tablet (75 mg total) by mouth at bedtime. What changed:  when to take this   ezetimibe 10 MG tablet Commonly known as:  Zetia Take 1 tablet (10 mg total) by mouth daily. What changed:  when to take this   FreeStyle Libre 14 Day Sensor Misc Place 1 patch onto the skin every 14 (fourteen) days.   gabapentin 300 MG capsule Commonly known as:  NEURONTIN Take 1 capsule (300 mg total) by mouth 3 (three) times daily.   insulin aspart 100 UNIT/ML injection Commonly known as:  novoLOG Inject 0-15 Units into the skin 3 (three) times daily with meals.   insulin aspart 100 UNIT/ML injection Commonly known as:  novoLOG Inject 4 Units into the skin 3 (three) times daily with meals.   insulin glargine 100 unit/mL Sopn Commonly known as:  LANTUS Inject 0.37 mLs (37 Units total) into the skin at bedtime. What changed:    when to take this  additional instructions   insulin glargine 100 UNIT/ML injection Commonly known as:  LANTUS Inject 0.3 mLs (30 Units total) into the skin at bedtime. What changed:  You were  already taking a medication with the same name, and this prescription was added. Make sure you understand how and when to take each.   insulin lispro 100 UNIT/ML KiwkPen Commonly known as:  HumaLOG KwikPen Inject 0-0.15 mLs (0-15 Units total) into the skin 2 (two) times daily. Typically takes 4-6 units per meal. Per sliding scale What changed:    how much to take  when to take this  reasons to take this  additional instructions   levETIRAcetam 500 MG 24 hr tablet Commonly known as:  Keppra XR Take 1 tablet (500 mg total) by mouth daily. What changed:  when to take this   linagliptin 5 MG  Tabs tablet Commonly known as:  TRADJENTA Take 1 tablet (5 mg total) by mouth daily. What changed:  when to take this   pentoxifylline 400 MG CR tablet Commonly known as:  TRENTAL Take 1 tablet (400 mg total) by mouth 3 (three) times daily with meals.       Diagnostic Studies: Xr Foot Complete Right  Result Date: 09/05/2018 Radiographs of the right foot show the patient to be status post transmetatarsal amputation without evidence of osteomyelitis over the residual metatarsals or elsewhere.  No other osseous abnormalities are noted.   Patient benefited maximally from their hospital stay and there were no complications.     Disposition: Discharge disposition: 02-Transferred to [facility]      Discharge Instructions    Call MD / Call 911   Complete by:  As directed    If you experience chest pain or shortness of breath, CALL 911 and be transported to the hospital emergency room.  If you develope a fever above 101 F, pus (white drainage) or increased drainage or redness at the wound, or calf pain, call your surgeon's office.   Constipation Prevention   Complete by:  As directed    Drink plenty of fluids.  Prune juice may be helpful.  You may use a stool softener, such as Colace (over the counter) 100 mg twice a day.  Use MiraLax (over the counter) for constipation as needed.   Diet - low sodium heart healthy   Complete by:  As directed    Increase activity slowly as tolerated   Complete by:  As directed      Follow-up Information    [name], MD In 1 week.   Specialty:  Orthopedic Surgery Contact information: [address] [place] [address] [phone]            Signed: [name] 09/19/2018, 3:02 PM

## 2018-09-19 NOTE — H&P (Signed)
Physical Medicine and Rehabilitation Admission H&P     Chief complaint: stump pain   HPI: Curtis Bradford a 58 year old right-handed male with history of CAD with stenting maintained on aspirin and Plavix,CKD stage III, diabetes mellitus, hypertension,CVA 2012, tobacco abuse, seizure disorder maintained on Keppra. 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 Per chart review patient lives alone. One level apartment. Friends check on him routinely. Presented 09/17/2018 with progressive dehiscence ischemic changes of recent right TMA amputation. 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 Lajoyce Corners with application of wound VAC. Hospital course pain management. Patient remains on aspirin and Plavix as prior to admission. Therapy evaluations completed with recommendations of physical medicine rehabilitation consult. Patient was admitted for a comprehensive rehabilitation program.   Review of Systems  Constitutional: Negative for chills and fever.  HENT: Negative for hearing loss.   Eyes: Negative for blurred vision and double vision.  Cardiovascular: Positive for leg swelling. Negative for chest pain and palpitations.  Gastrointestinal: Positive for constipation. Negative for heartburn, nausea and vomiting.  Genitourinary: Positive for urgency. Negative for dysuria, flank pain and hematuria.  Musculoskeletal: Positive for joint pain and myalgias.  Skin: Negative for rash.  Neurological: Positive for seizures and weakness.  Psychiatric/Behavioral: The patient has insomnia.   All other systems reviewed and are negative.       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 2012-   Tobacco abuse           Past Surgical History:  Procedure Laterality Date   AMPUTATION Right 02/23/2017    Procedure: RIGHT FOOT 5TH RAY AMPUTATION;  Surgeon: [name], MD;  Location: [facility] OR;  Service: Orthopedics;  Laterality: Right;   AMPUTATION Right 04/23/2018    Procedure: Right Great Toe Amputation;  Surgeon: [name], MD;  Location: [facility] OR;  Service: Orthopedics;  Laterality: Right;   AMPUTATION Right 07/23/2018    Procedure: RIGHT TRANSMETATARSAL AMPUTATION, APPLY WOUND VAC;  Surgeon: [name], MD;  Location: [facility] OR;  Service: Orthopedics;  Laterality: Right;   AMPUTATION Right 09/17/2018    Procedure: RIGHT AMPUTATION BELOW KNEE;  Surgeon: [name], MD;  Location: [facility] OR;  Service: Orthopedics;  Laterality: Right;   APPLICATION OF WOUND VAC Right 09/17/2018    Procedure: Application Of Wound Vac;  Surgeon: [name], MD;  Location: [facility] OR;  Service: Orthopedics;  Laterality: Right;   BELOW KNEE LEG AMPUTATION Right 09/17/2018   CORONARY ANGIOPLASTY   03/05/2008    stent   FOOT SURGERY       PERIPHERAL ARTERIAL STENT GRAFT Right 2014    leg 2 stents- clotted per pat   STENT PLACEMENT VASCULAR ([facility] HX)  Several in right leg   TONSILLECTOMY       TRANSMETATARSAL AMPUTATION Right 07/23/2018         Family History  Problem Relation Age of Onset   Diabetes Mellitus II Mother     Diabetes Mellitus II Father      Social History:  reports that he has been smoking cigarettes. He has a 3.75 pack-year smoking history. He has never used smokeless tobacco. He reports previous alcohol use. He reports that he does not use drugs. Allergies: No Known Allergies         Medications Prior to Admission  Medication Sig Dispense Refill   aspirin EC 81 MG tablet Take 81 mg by mouth daily.        atorvastatin (LIPITOR) 20 MG tablet Take 1 tablet (20 mg total) by mouth daily. (Patient taking differently: Take 20 mg by mouth daily at 6 PM. ) 90 tablet 3   clopidogrel (PLAVIX) 75 MG tablet Take 1 tablet (75 mg total) by mouth at bedtime. (Patient taking differently: Take 75 mg by mouth daily. ) 90 tablet 4   ezetimibe (ZETIA) 10 MG tablet Take 1 tablet (10 mg total) by mouth daily. (Patient taking differently: Take 10 mg by mouth daily at 6 PM. ) 90 tablet 3   HYDROcodone-acetaminophen (NORCO/VICODIN) 5-325 MG tablet Take 1 tablet by mouth every 6 (six) hours as needed for moderate pain. 20 tablet 0   insulin glargine (LANTUS) 100 unit/mL SOPN Inject 0.37 mLs (37 Units total) into the skin at bedtime. (Patient taking differently: Inject 37 Units into the skin daily. Pt's bedtime is morning) 15 mL 3   insulin lispro (HUMALOG KWIKPEN) 100 UNIT/ML KiwkPen Inject 0-0.15 mLs (0-15 Units total) into the skin 2 (two) times daily. Typically takes 4-6 units per meal. Per sliding scale (Patient taking differently: Inject 4-12 Units into the skin 3 (three) times daily as needed (high blood sugar). ) 15 mL 11   levETIRAcetam (KEPPRA XR) 500 MG 24 hr tablet Take 1 tablet (500 mg total) by mouth daily. (Patient taking differently: Take 500 mg by mouth every morning. ) 90 tablet 3   linagliptin (TRADJENTA) 5 MG TABS tablet Take 1 tablet (5 mg total) by mouth daily. (Patient taking differently: Take 5 mg by mouth daily at 6 PM. ) 30 tablet 3   clindamycin (CLEOCIN) 300 MG capsule Take 1 capsule (300 mg total) by mouth 3 (three) times daily. (Patient not taking: Reported on 09/12/2018) 40 capsule 0   Continuous Blood Gluc Sensor (FREESTYLE LIBRE 14 DAY SENSOR) MISC Place 1 patch onto the skin every 14 (fourteen) days. 6 each 2   nitroGLYCERIN (NITRODUR - DOSED IN MG/24 HR) 0.4  mg/hr patch Place 1 patch (0.4 mg total) onto the skin daily. (Patient not taking: Reported on 09/12/2018) 30 patch 12   oxyCODONE-acetaminophen (PERCOCET/ROXICET) 5-325 MG tablet Take 1 tablet by mouth every 6 (six) hours as needed for moderate pain or severe pain. (Patient not taking: Reported on 09/12/2018) 30 tablet 0   pentoxifylline (TRENTAL) 400 MG CR tablet Take 1 tablet (400 mg total) by mouth 3 (three) times daily with meals. (Patient not taking: Reported on 09/12/2018) 90 tablet 3   sulfamethoxazole-trimethoprim (BACTRIM DS,SEPTRA DS) 800-160 MG tablet Take 1 tablet by mouth 2 (two) times daily. (Patient not taking: Reported on 09/12/2018) 28 tablet 0      Drug Regimen Review Drug regimen was reviewed and remains appropriate with no significant issues identified   Home: Home  Living Family/patient expects to be discharged to:: Private residence Living Arrangements: Alone Available Help at Discharge: Friend(s), Available PRN/intermittently Type of Home: Apartment Home Access: Level entry Home Layout: One level Bathroom Shower/Tub: Engineer, manufacturing systems: Standard Home Equipment: None Additional Comments: Pt reports friends will come to drop him off home but may not receive much help in home setting unless he pays someone to come help.   Functional History: Prior Function Level of Independence: Independent with assistive device(s) Comments: Was using knee scooter for mobility   Functional Status:  Mobility: Bed Mobility Overal bed mobility: Needs Assistance Bed Mobility: Supine to Sit Supine to sit: Supervision Sit to supine: Supervision General bed mobility comments: Supervision for safety; use of rail Transfers Overall transfer level: Needs assistance Equipment used: Rolling walker (2 wheeled) Transfer via Lift Equipment: Stedy Transfers: Sit to/from Stand Sit to Stand: Mod assist General transfer comment: cues for safe hand placement and positioning prior  to stand; pt stood X 2 trials with assist to power up into standing and steady upon stand Ambulation/Gait Ambulation/Gait assistance: Min assist, Mod assist Gait Distance (Feet): (10 ft X 2 trials) Assistive device: Rolling walker (2 wheeled) Gait Pattern/deviations: Step-to pattern General Gait Details: cues for safe use of AD and posture; assist at times for balance but no overt LOB  Gait velocity: decreased   ADL: ADL Overall ADL's : Needs assistance/impaired Eating/Feeding: Set up, Sitting Grooming: Wash/dry hands, Wash/dry face, Oral care, Set up, Sitting Upper Body Bathing: Independent Lower Body Bathing: Min guard Lower Body Dressing: Sitting/lateral leans, Min guard Toilet Transfer: Moderate assistance, Cueing for safety, Cueing for sequencing, RW Toilet Transfer Details (indicate cue type and reason): simulated transfer from bed to recliner Functional mobility during ADLs: Moderate assistance, Cueing for safety, Cueing for sequencing, Rolling walker   Cognition: Cognition Overall Cognitive Status: Within Functional Limits for tasks assessed Orientation Level: Oriented X4 Cognition Arousal/Alertness: Awake/alert Behavior During Therapy: WFL for tasks assessed/performed Overall Cognitive Status: Within Functional Limits for tasks assessed   Physical Exam: Blood pressure 133/70, pulse 95, temperature 98.8 F (37.1 C), temperature source Oral, resp. rate 18, height  (1.88 m), weight 90.7 kg, SpO2 95 %. Physical Exam  Vitals reviewed. Constitutional: He is oriented to person, place, and time. He appears well-developed and well-nourished.  HENT:  Head: Normocephalic and atraumatic.  Eyes: Pupils are equal, round, and reactive to light. EOM are normal.  Neck: Normal range of motion. No thyromegaly present.  Cardiovascular: Normal rate and regular rhythm. Exam reveals no friction rub.  No murmur heard. Respiratory: Effort normal and breath sounds normal. No respiratory  distress. He has no wheezes.  GI: Soft. He exhibits no distension. There is no abdominal tenderness.  Musculoskeletal:     Comments: Right leg with expected edema, tender to palpation  Neurological: He is alert and oriented to person, place, and time.  UE 5/5. LLE 4/5 prox to distal. Able to lift RLE off bed and can bend knee.  Skin:  BKA site is dressed with wound VAC in place to suction  Psychiatric:  Pt very anxious, tangential, difficult to redirect      Lab Results Last 48 Hours  Results for orders placed or performed during the hospital encounter of 09/17/18 (from the past 48 hour(s))  Glucose, capillary     Status: Abnormal    Collection Time: 09/17/18  7:32 AM  Result Value Ref Range    Glucose-Capillary 119 (H) 70 - 99 mg/dL  Basic metabolic panel  Status: Abnormal    Collection Time: 09/17/18  8:07 AM  Result Value Ref Range    Sodium 136 135 - 145 mmol/L    Potassium 4.1 3.5 - 5.1 mmol/L    Chloride 104 98 - 111 mmol/L    CO2 22 22 - 32 mmol/L    Glucose, Bld 105 (H) 70 - 99 mg/dL    BUN 21 (H) 6 - 20 mg/dL    Creatinine, Ser 8.64 (H) 0.61 - 1.24 mg/dL    Calcium 8.5 (L) 8.9 - 10.3 mg/dL    GFR calc non Af Amer 53 (L) >60 mL/min    GFR calc Af Amer >60 >60 mL/min    Anion gap 10 5 - 15      Comment: Performed at [facility] Lab, [address], [place], [place] [address]  CBC     Status: Abnormal    Collection Time: 09/17/18  8:07 AM  Result Value Ref Range    WBC 14.2 (H) 4.0 - 10.5 K/uL    RBC 4.86 4.22 - 5.81 MIL/uL    Hemoglobin 14.7 13.0 - 17.0 g/dL    HCT 72.1 82.8 - 83.3 %    MCV 94.0 80.0 - 100.0 fL    MCH 30.2 26.0 - 34.0 pg    MCHC 32.2 30.0 - 36.0 g/dL    RDW 74.4 51.4 - 60.4 %    Platelets 369 150 - 400 K/uL    nRBC 0.0 0.0 - 0.2 %      Comment: Performed at [facility], [address], [place], [place] [address]  Hemoglobin A1c     Status: Abnormal    Collection Time: 09/17/18  8:07 AM  Result Value Ref Range    Hgb A1c MFr Bld  7.5 (H) 4.8 - 5.6 %      Comment: (NOTE) Pre diabetes:          5.7%-6.4% Diabetes:              >6.4% Glycemic control for   <7.0% adults with diabetes      Mean Plasma Glucose 168.55 mg/dL      Comment: Performed at [facility], [address], [place], [place] [address]  Glucose, capillary     Status: None    Collection Time: 09/17/18  9:29 AM  Result Value Ref Range    Glucose-Capillary 88 70 - 99 mg/dL    Comment 1 Notify RN      Comment 2 Document in Chart    Glucose, capillary     Status: None    Collection Time: 09/17/18 10:42 AM  Result Value Ref Range    Glucose-Capillary 97 70 - 99 mg/dL  Glucose, capillary     Status: Abnormal    Collection Time: 09/17/18  5:55 PM  Result Value Ref Range    Glucose-Capillary 144 (H) 70 - 99 mg/dL  Glucose, capillary     Status: Abnormal    Collection Time: 09/17/18  9:05 PM  Result Value Ref Range    Glucose-Capillary 136 (H) 70 - 99 mg/dL  Glucose, capillary     Status: None    Collection Time: 09/18/18  8:17 AM  Result Value Ref Range    Glucose-Capillary 82 70 - 99 mg/dL  Glucose, capillary     Status: None    Collection Time: 09/18/18 11:22 AM  Result Value Ref Range    Glucose-Capillary 97 70 - 99 mg/dL  Glucose, capillary  Status: Abnormal    Collection Time: 09/18/18  4:53 PM  Result Value Ref Range    Glucose-Capillary 127 (H) 70 - 99 mg/dL  Glucose, capillary     Status: Abnormal    Collection Time: 09/18/18  9:02 PM  Result Value Ref Range    Glucose-Capillary 111 (H) 70 - 99 mg/dL      Imaging Results (Last 48 hours)  No results found.           Medical Problem List and Plan: 1.  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 Pt very concerned about job and being able to return to work  place 2.  Antithrombotics: -DVT/anticoagulation: SCD left lower extremity              -antiplatelet therapy: aspirin 81 mg daily, Plavix 75 mg daily 3. Pain Management: Neurontin 300 mg 3 times a day             - oxycodone and Robaxin as needed 4. Mood:  Provide emotional support             -antipsychotic agents: N/A 5. Neuropsych: This patient is capable of making decisions on his own behalf. 6. Skin/Wound Care:  Routine skin checks 7. Fluids/Electrolytes/Nutrition:  Routine ins and outs with follow-up chemistries 8. Diabetes mellitus. Hemoglobin A1c 7.5.              -Lantus insulin 30 units daily at bedtime             -Tradjenta 5 mg daily.              -Check blood sugars before meals and at bedtime 9. CAD with stenting. Continue aspirin and Plavix. No chest pain or shortness of breath 10. History of seizures. Keppra 500 mg daily. 11.CKD stage III. Creatinine baseline 1.35-1.80. Follow-up chemistries 12. Hyperlipidemia.Zetia10 mg daily, Lipitor 20 mg daily 13. History of tobacco abuse. Provide counseling 14. Constipation. Laxative assistance     Post Admission Physician Evaluation: 1. Functional deficits secondary  to right BKA. 2. [name] admitted to receive collaborative, interdisciplinary care between the physiatrist, rehab nursing staff, and therapy team. 3. Patient's level of medical complexity and substantial therapy needs in context of that medical necessity cannot be provided at a lesser intensity of care such as a SNF. 4. Patient has experienced substantial functional loss from his/her baseline which was documented above under the "Functional History" and "Functional Status" headings.  Judging by the patient's diagnosis, physical exam, and functional history, the patient has potential for functional progress which will result in measurable gains while on inpatient rehab.  These gains will be of substantial and practical use upon discharge  in facilitating mobility and  self-care at the household level. 5. Physiatrist will provide 24 hour management of medical needs as well as oversight of the therapy plan/treatment and provide guidance as appropriate regarding the interaction of the two. 6. The Preadmission Screening has been reviewed and patient status is unchanged unless otherwise stated above. 7. 24 hour rehab nursing will assist with bladder management, bowel management, safety, skin/wound care, disease management, medication administration, pain management and patient education  and help integrate therapy concepts, techniques,education, etc. 8. PT will assess and treat  for/with: Lower extremity strength, range of motion, stamina, balance, functional mobility, safety, adaptive techniques and equipment, pre-prosthetic ed.   Goals are: mod I. 9. OT will assess and treat for/with: ADL's, functional mobility, safety, upper extremity strength, adaptive techniques and equipment, pre-prosthetic ed.   Goals are: mod I. Therapy may proceed with showering this patient if vac is protected 10. SLP will assess and treat for/with: n/a.  Goals are: n/a. 11. Case Management and Social Worker will assess and treat for psychological issues and discharge planning. 12. Team conference will be held weekly to assess progress toward goals and to determine barriers to discharge. 13. Patient will receive at least 3 hours of therapy per day at least 5 days per week. 14. ELOS: 7 days       15. Prognosis:  excellent   I have personally performed a face to face diagnostic evaluation of this patient and formulated the key components of the plan.  Additionally, I have personally reviewed laboratory data, imaging studies, as well as relevant notes and concur with the physician assistant's documentation above.  Ranelle Oyster, MD, FAAPMR     Mcarthur Rossetti Angiulli, PA-C 09/19/2018

## 2018-09-20 ENCOUNTER — Encounter (HOSPITAL_COMMUNITY): Payer: Self-pay | Admitting: *Deleted

## 2018-09-20 ENCOUNTER — Inpatient Hospital Stay (HOSPITAL_COMMUNITY): Payer: Managed Care, Other (non HMO) | Admitting: Occupational Therapy

## 2018-09-20 ENCOUNTER — Inpatient Hospital Stay (HOSPITAL_COMMUNITY): Payer: Managed Care, Other (non HMO) | Admitting: Physical Therapy

## 2018-09-20 DIAGNOSIS — G8918 Other acute postprocedural pain: Secondary | ICD-10-CM

## 2018-09-20 LAB — GLUCOSE, CAPILLARY
Glucose-Capillary: 84 mg/dL (ref 70–99)
Glucose-Capillary: 79 mg/dL (ref 70–99)
Glucose-Capillary: 125 mg/dL — ABNORMAL HIGH (ref 70–99)
Glucose-Capillary: 147 mg/dL — ABNORMAL HIGH (ref 70–99)

## 2018-09-20 MED ORDER — OXYCODONE HCL ER 10 MG PO T12A
10.0000 mg | EXTENDED_RELEASE_TABLET | Freq: Every day | ORAL | Status: DC
  Administered 2018-09-20 – 2018-09-22 (×3): 10 mg via ORAL
  Filled 2018-09-20 (×3): qty 1

## 2018-09-20 NOTE — Evaluation (Signed)
Physical Therapy Assessment and Plan  Patient Details  Name: Curtis Bradford MRN: [id number] Date of Birth: 1960/11/15  PT Diagnosis: Abnormality of gait, Coordination disorder, Difficulty walking, Muscle weakness and Pain in R residual limb Rehab Potential: Good ELOS: 9-12 days   Today's Date: 09/20/2018 PT Individual Time: 1100-1155 PT Individual Time Calculation (min): 55 min    Problem List:  Patient Active Problem List   Diagnosis Date Noted  . Right below-knee amputee ([place]) 09/19/2018  . Below knee amputation ([place]) 09/17/2018  . Gangrene of right foot ([place])   . Status post transmetatarsal amputation of foot, right ([place]) 07/23/2018  . Osteomyelitis ([place]) 07/23/2018  . TIA (transient ischemic attack) 10/30/2017  . Medication monitoring encounter 03/28/2017  . Status post amputation of toe of right foot ([place]) 03/05/2017  . Subacute osteomyelitis, right ankle and foot ([place])   . Peripheral neuropathy 02/16/2017  . Peripheral artery disease ([place]) 02/16/2017  . Type 2 diabetes mellitus with diabetic foot ulcer ([place]) 02/14/2017  . Diabetic foot infection ([place]) 02/14/2017  . Type II diabetes mellitus with renal manifestations ([place])   . HLD (hyperlipidemia)   . Tobacco abuse   . CKD stage 2 due to type 2 diabetes mellitus ([place])   . CAD (coronary artery disease)   . Seizure disorder [facility])     Past Medical History:  Past Medical History:  Diagnosis Date  . CAD (coronary artery disease)   . CKD (chronic kidney disease), stage III ([place])   . Diabetes mellitus without complication ([address])    Type II  . History of DVT (deep vein thrombosis)   . History of kidney stones    passed stones - no surgery  . HLD (hyperlipidemia)   . Myocardial infarction ([place]) 2009  . Peripheral vascular disease ([facility])    right leg  . Seizure ([place])    last one 2015  . Stroke [facility]) 2012, 10/2017   2012-speech was effected- has come back.10/2017- numbness of right side no taste right  side. 2012-  . Tobacco  abuse    Past Surgical History:  Past Surgical History:  Procedure Laterality Date  . AMPUTATION Right 02/23/2017   Procedure: RIGHT FOOT 5TH RAY AMPUTATION;  Surgeon: Newt Minion, MD;  Location: [place];  Service: Orthopedics;  Laterality: Right;  . AMPUTATION Right 04/23/2018   Procedure: Right Great Toe Amputation;  Surgeon: Newt Minion, MD;  Location: [place];  Service: Orthopedics;  Laterality: Right;  . AMPUTATION Right 07/23/2018   Procedure: RIGHT TRANSMETATARSAL AMPUTATION, APPLY WOUND VAC;  Surgeon: Newt Minion, MD;  Location: [place];  Service: Orthopedics;  Laterality: Right;  . AMPUTATION Right 09/17/2018   Procedure: RIGHT AMPUTATION BELOW KNEE;  Surgeon: Newt Minion, MD;  Location: [place];  Service: Orthopedics;  Laterality: Right;  . APPLICATION OF WOUND VAC Right 09/17/2018   Procedure: Application Of Wound Vac;  Surgeon: Newt Minion, MD;  Location: [place];  Service: Orthopedics;  Laterality: Right;  . BELOW KNEE LEG AMPUTATION Right 09/17/2018  . CORONARY ANGIOPLASTY  03/05/2008   stent  . FOOT SURGERY    . PERIPHERAL ARTERIAL STENT GRAFT Right 2014   leg 2 stents- clotted per pat  . STENT PLACEMENT VASCULAR ([place] [facility])     Several in right leg  . TONSILLECTOMY    . TRANSMETATARSAL AMPUTATION Right 07/23/2018    Assessment & Plan Clinical Impression: [name] a 58 year old right-handed male with history of CAD with stenting maintained on aspirin and Plavix,CKD  stage III, diabetes mellitus, hypertension,CVA 2012, tobacco abuse, seizure disorder maintained on Keppra. 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 Per chart review patient lives alone. One level apartment. Friends check on him routinely. Presented 09/17/2018 with progressive dehiscence ischemic changes of recent right TMA amputation. 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 Sharol Given with application of wound VAC. Hospital course pain  management. Patient remains on aspirin and Plavix as prior to admission. Therapy evaluations completed with recommendations of physical medicine rehabilitation consult. Patient transferred to CIR on 09/19/2018 .   Patient currently requires min with mobility secondary to muscle weakness and muscle joint tightness, decreased cardiorespiratoy endurance and decreased sitting balance, decreased standing balance and decreased balance strategies.  Prior to hospitalization, patient was modified independent  with mobility and lived with Alone in a [place] home.  Home access is  Level entry.  Patient will benefit from skilled PT intervention to maximize safe functional mobility, minimize fall risk and decrease caregiver burden for planned discharge home with intermittent assist.  Anticipate patient will benefit from follow up [facility] at discharge.  PT - End of Session Activity Tolerance: Tolerates < 10 min activity, no significant change in vital signs Endurance Deficit: Yes Endurance Deficit Description: increased work of breathing w/ all mobility, increased energy demands w/ recent R BKA PT Assessment Rehab Potential (ACUTE/IP ONLY): Good PT Barriers to Discharge: Decreased caregiver support;Wound Care PT Barriers to Discharge Comments: History of non-healing amputation incisions PT Patient demonstrates impairments in the following area(s): Endurance;Balance;Pain;Safety;Skin Integrity;Sensory PT Transfers Functional Problem(s): Bed Mobility;Furniture;Floor;Bed to Chair;Car PT Locomotion Functional Problem(s): Wheelchair Mobility;Ambulation PT Plan PT Intensity: Minimum of 1-2 x/day ,45 to 90 minutes PT Frequency: 5 out of 7 days PT Duration Estimated Length of Stay: 9-12 days PT Treatment/Interventions: Ambulation/gait training;Balance/vestibular training;Community reintegration;Discharge planning;Disease management/prevention;DME/adaptive equipment instruction;Functional electrical stimulation;Functional  mobility training;Neuromuscular re-education;Pain management;Patient/family education;Psychosocial support;Skin care/wound management;Splinting/orthotics;Therapeutic Activities;Therapeutic Exercise;UE/LE Strength taining/ROM;UE/LE Coordination activities;Wheelchair propulsion/positioning PT Transfers Anticipated Outcome(s): modified independent PT Locomotion Anticipated Outcome(s): modified independent household gait and w/c locomotion PT Recommendation Recommendations for Other Services: Neuropsych consult Follow Up Recommendations: Home health PT Patient destination: Home Equipment Recommended: To be determined  Skilled Therapeutic Intervention  Pt in w/c and agreeable to therapy, pain as detailed below. Performed w/c mobility, bed mobility, squat pivot transfers, sit<>stand to RW, and gait w/ RW x 30' as detailed below. Needed multiple attempts at standing to RW 2/2 identifying technique that worked best for weight distribution for balance. Additionally performed car transfer w/ min assist. Initiated amputee education regarding residual limb wrapping, limb desensitization techniques, and preparation for prosthetic use including importance of maintaining R knee extension ROM. Pt verbalized understanding of all education and was provided w/ written handout as well. Instructed pt in results of PT evaluation as detailed below, PT POC, rehab potential, rehab goals, and discharge recommendations. Ended session in supine, all needs in reach.  PT Evaluation Precautions/Restrictions Precautions Precautions: Fall Restrictions Weight Bearing Restrictions: Yes RLE Weight Bearing: Non weight bearing Pain Pain Assessment Pain Score: 2  Home Living/Prior Functioning Home Living Available Help at Discharge: Friend(s);Available PRN/intermittently Type of Home: Apartment Home Access: Level entry Home Layout: One level Bathroom Shower/Tub: Chiropodist: Standard Bathroom  Accessibility: Yes Additional Comments: Pt reports friends will come to drop him off home but may not receive much help in home setting unless he pays someone to come help.  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, on short term disability at this point Vision/Perception  Perception Perception: Within [place]: Intact  Cognition Overall Cognitive Status: Within Functional Limits for tasks assessed Arousal/Alertness: Awake/alert Orientation Level: Oriented X4 Memory: Appears intact Awareness: Appears intact Problem Solving: Appears intact Safety/Judgment: Appears intact Sensation Sensation Light Touch: Appears Intact(hypersensitive at residual limb incision site) Coordination Gross Motor Movements are Fluid and Coordinated: No Coordination and Movement Description: impaired 2/2 recent R BKA Motor  Motor Motor: Within Functional Limits Motor - Skilled Clinical Observations: generalized weakness  Mobility Bed Mobility Bed Mobility: Rolling Right;Rolling Left;Supine to Sit;Sit to Supine Rolling Right: Supervision/verbal cueing Rolling Left: Supervision/Verbal cueing Supine to Sit: Supervision/Verbal cueing Sit to Supine: Supervision/Verbal cueing Transfers Transfers: Sit to Stand;Stand to Sit;Stand Pivot Transfers Sit to Stand: Minimal Assistance - Patient > 75% Stand to Sit: Minimal Assistance - Patient > 75% Stand Pivot Transfers: Minimal Assistance - Patient > 75% Stand Pivot Transfer Details: Verbal cues for safe use of DME/AE;Verbal cues for precautions/safety;Verbal cues for technique;Manual facilitation for  weight shifting;Manual facilitation for weight bearing Transfer (Assistive device): Rolling walker Locomotion  Gait Ambulation: Yes Gait Assistance: Minimal Assistance - Patient > 75% Gait Distance (Feet): 30 Feet Assistive device: Rolling walker Gait Assistance Details: Manual facilitation for weight bearing;Verbal cues for safe use of DME/AE;Verbal cues for technique;Verbal cues for precautions/safety;Manual facilitation for weight shifting Gait Gait: Yes Gait Pattern: Impaired Gait Pattern: Step-to pattern(impaired 2/2 R BKA) Gait velocity: decreased Stairs / Additional Locomotion Stairs: No Wheelchair Mobility Wheelchair Mobility: Yes Wheelchair Assistance: Chartered loss adjuster: Both upper extremities Wheelchair Parts Management: Supervision/cueing Distance: 150'  Trunk/Postural Assessment  Cervical Assessment Cervical Assessment: Within Functional Limits Thoracic Assessment Thoracic Assessment: Within Functional Limits Lumbar Assessment Lumbar Assessment: Within Functional Limits Postural Control Postural Control: Within Functional Limits  Balance Balance Balance Assessed: Yes Static Sitting Balance Static Sitting - Balance Support: No upper extremity supported;Feet supported Static Sitting - Level of Assistance: 5: Stand by assistance Dynamic Sitting Balance Dynamic Sitting - Balance Support: No upper extremity supported;During functional activity Dynamic Sitting - Level of Assistance: 5: Stand by assistance Static Standing Balance Static Standing - Balance Support: Bilateral upper extremity supported;During functional activity Static Standing - Level of Assistance: 4: Min assist Dynamic Standing Balance Dynamic Standing - Balance Support: During functional activity;Left upper extremity supported Dynamic Standing - Level of Assistance: 4: Min assist Extremity Assessment  RLE Assessment RLE Assessment: Exceptions to [facility] Passive Range of  Motion (PROM) Comments: Pain at end ranges of knee flexion/extension, limited in knee flexion 2/2 pain, hip WFL, R BKA General Strength Comments: limited by pain, hip WFL LLE Assessment LLE Assessment: Within Functional Limits    Refer to Care Plan for Long Term Goals  Recommendations for other services: Neuropsych  Discharge Criteria: Patient will be discharged from PT if patient refuses treatment 3 consecutive times without medical reason, if treatment goals not met, if there is a change in medical status, if patient makes no progress towards goals or if patient is discharged from hospital.  The above assessment, treatment plan, treatment alternatives and goals were discussed and mutually agreed upon: by patient  Curtis Bradford Clent Demark 09/20/2018, 12:46 PM

## 2018-09-20 NOTE — Progress Notes (Signed)
Occupational Therapy Session Note  Patient Details  Name: Curtis Bradford MRN: [id number] Date of Birth: 1961-06-08  Today's Date: 09/20/2018 OT Individual Time: 7829-5621 OT Individual Time Calculation (min): 27 min   Short Term Goals: Week 1:  OT Short Term Goal 1 (Week 1): Pt will complete bathing sit to stand with supervision. OT Short Term Goal 2 (Week 1): Pt will complete LB dressing sit to stand with supervision. OT Short Term Goal 3 (Week 1): Pt will complete toilet transfer supervision using the RW. OT Short Term Goal 4 (Week 1): Pt complete tub shower transfer with supervision using the RW and tub bench.    Skilled Therapeutic Interventions/Progress Updates:  Pt greeted in bed, not yet due for pain medicine and reporting residual limb pain. Discussed using massage/tap technique for pain mgt. Squat pivot<w/c completed with Min A and vcs. Worked on standing balance/endurance while washing hair at sink. Min A sit<stand and Mod A for balance with unilateral UE support on counter or when leaning forward onto elbows. Able to stand for 3 minutes before needing a rest break. He completed oral care and handwashing while seated due to fatigue. Pt then completed squat pivot<bed again with vcs for technique and Min A for buttocks clearance.  Pt left with all needs within reach and bed alarm set.   Therapy Documentation Precautions:  Precautions Precautions: Fall Precaution Comments: precautions/positioning reviewed with pt Restrictions Weight Bearing Restrictions: Yes RLE Weight Bearing: Non weight bearing Pain: Pain Assessment Pain Scale: 0-10 Pain Score: 6  Faces Pain Scale: Hurts a little bit Pain Type: Surgical pain;Phantom pain Pain Location: Leg Pain Orientation: Right Pain Descriptors / Indicators: Discomfort;Aching Pain Frequency: Intermittent Pain Onset: On-going Patients Stated Pain Goal: 2 Pain Intervention(s): Medication (See eMAR) ADL: ADL Eating: Independent Where  Assessed-Eating: Edge of bed Grooming: Setup Where Assessed-Grooming: Wheelchair Upper Body Bathing: Setup Where Assessed-Upper Body Bathing: Wheelchair Lower Body Bathing: Minimal assistance Where Assessed-Lower Body Bathing: Wheelchair Upper Body Dressing: Supervision/safety Where Assessed-Upper Body Dressing: Edge of bed Lower Body Dressing: Minimal assistance Where Assessed-Lower Body Dressing: Standing at sink, Sitting at sink, Wheelchair Toileting: Minimal assistance Where Assessed-Toileting: Bedside Commode Toilet Transfer: Minimal assistance Toilet Transfer Method: Stand pivot Toilet Transfer Equipment: Bedside commode     Therapy/Group: Individual Therapy  Abundio Miu 09/20/2018, 4:38 PM

## 2018-09-20 NOTE — Progress Notes (Signed)
   09/20/18 1911  What Happened  Was fall witnessed? No  Was patient injured? No  Patient found on floor;in bathroom  Found by Staff-comment Chyrel Masson, NT)  Stated prior activity other (comment) (attempting to pull up pants while looking to pull call bell )  Follow Up  MD notified Dr. Wynn Banker  Time MD notified [phone]  Family notified No- patient refusal  Additional tests No  Progress note created (see row info) Yes  Adult Fall Risk Assessment  Risk Factor Category (scoring not indicated) Fall has occurred during this admission (document High fall risk)  Age 58  Fall History: Fall within 6 months prior to admission 0  Elimination; Bowel and/or Urine Incontinence 0  Elimination; Bowel and/or Urine Urgency/Frequency 0  Medications: includes PCA/Opiates, Anti-convulsants, Anti-hypertensives, Diuretics, Hypnotics, Laxatives, Sedatives, and Psychotropics 5  Patient Care Equipment 2  Mobility-Assistance 2  Mobility-Gait 2  Mobility-Sensory Deficit 0  Altered awareness of immediate physical environment 1  Impulsiveness 2  Lack of understanding of one's physical/cognitive limitations 4  Total Score 18  Patient Fall Risk Level High fall risk  Adult Fall Risk Interventions  Required Bundle Interventions *See Row Information* High fall risk - low, moderate, and high requirements implemented  Additional Interventions Lap belt while in chair/wheelchair;Use of appropriate toileting equipment (bedpan, BSC, etc.)  Screening for Fall Injury Risk (To be completed on HIGH fall risk patients) - Assessing Need for Low Bed  Risk For Fall Injury- Low Bed Criteria None identified - Continue screening  Will Implement Low Bed and Floor Mats Low bed contraindicated, floor mats in place  Specialty Low Bed Contraindicated Hemodynamically unstable  Screening for Fall Injury Risk (To be completed on HIGH fall risk patients who do not meet crieteria for Low Bed) - Assessing Need for Floor Mats Only  Risk For Fall  Injury- Criteria for Floor Mats Bleeding risk-anticoagulation (not prophylaxis)  Will Implement Floor Mats Yes  Vitals  Temp 98.3 F (36.8 C)  Temp Source Oral  BP 132/84  MAP (mmHg) 99  BP Location Left Arm  BP Method Automatic  Patient Position (if appropriate) Sitting  Pulse Rate (!) 105  Resp 18  Oxygen Therapy  SpO2 99 %  O2 Device Room Air  Pain Assessment  Pain Scale 0-10  Pain Score 0  Neurological  Neuro (WDL) WDL  Musculoskeletal  Musculoskeletal (WDL) X  Assistive Device Wheelchair  Generalized Weakness Yes  Weight Bearing Restrictions Yes  RLE Weight Bearing NWB  Musculoskeletal Details  RLE Surgery;BKA;Limited movement;Weakness  Integumentary  Integumentary (WDL) X  Skin Color Appropriate for ethnicity  Skin Condition Dry  Skin Integrity Surgical Incision (see LDA)  Skin Turgor Non-tenting

## 2018-09-20 NOTE — Evaluation (Addendum)
Occupational Therapy Assessment and Plan  Patient Details  Name: Curtis Bradford MRN: [id number] Date of Birth: 02-Dec-1960  OT Diagnosis: abnormal posture, acute pain, muscle weakness (generalized) and pain in joint Rehab Potential: Rehab Potential (ACUTE ONLY): Excellent ELOS: 9-12 days   Today's Date: 09/20/2018 OT Individual Time: 9163-8466 OT Individual Time Calculation (min): 76 min     Problem List:  Patient Active Problem List   Diagnosis Date Noted  . Right below-knee amputee ([place]) 09/19/2018  . Below knee amputation ([place]) 09/17/2018  . Gangrene of right foot ([place])   . Status post transmetatarsal amputation of foot, right ([place]) 07/23/2018  . Osteomyelitis ([place]) 07/23/2018  . TIA (transient ischemic attack) 10/30/2017  . Medication monitoring encounter 03/28/2017  . Status post amputation of toe of right foot ([place]) 03/05/2017  . Subacute osteomyelitis, right ankle and foot ([place])   . Peripheral neuropathy 02/16/2017  . Peripheral artery disease ([place]) 02/16/2017  . Type 2 diabetes mellitus with diabetic foot ulcer ([place]) 02/14/2017  . Diabetic foot infection ([place]) 02/14/2017  . Type II diabetes mellitus with renal manifestations ([place])   . HLD (hyperlipidemia)   . Tobacco abuse   . CKD stage 2 due to type 2 diabetes mellitus ([place])   . CAD (coronary artery disease)   . Seizure disorder [facility])     Past Medical History:  Past Medical History:  Diagnosis Date  . CAD (coronary artery disease)   . CKD (chronic kidney disease), stage III ([place])   . Diabetes mellitus without complication ([place])    Type II  . History of DVT (deep vein thrombosis)   . History of kidney stones    passed stones - no surgery  . HLD (hyperlipidemia)   . Myocardial infarction ([place]) 2009  . Peripheral vascular disease ([facility])    right leg  . Seizure ([place])    last one 2015  . Stroke [facility]) 2012, 10/2017   2012-speech was effected- has come back.10/2017- numbness of right side no taste right  side. 2012-   . Tobacco abuse    Past Surgical History:  Past Surgical History:  Procedure Laterality Date  . AMPUTATION Right 02/23/2017   Procedure: RIGHT FOOT 5TH RAY AMPUTATION;  Surgeon: Newt Minion, MD;  Location: [place];  Service: Orthopedics;  Laterality: Right;  . AMPUTATION Right 04/23/2018   Procedure: Right Great Toe Amputation;  Surgeon: Newt Minion, MD;  Location: [place];  Service: Orthopedics;  Laterality: Right;  . AMPUTATION Right 07/23/2018   Procedure: RIGHT TRANSMETATARSAL AMPUTATION, APPLY WOUND VAC;  Surgeon: Newt Minion, MD;  Location: [place];  Service: Orthopedics;  Laterality: Right;  . AMPUTATION Right 09/17/2018   Procedure: RIGHT AMPUTATION BELOW KNEE;  Surgeon: Newt Minion, MD;  Location: [place];  Service: Orthopedics;  Laterality: Right;  . APPLICATION OF WOUND VAC Right 09/17/2018   Procedure: Application Of Wound Vac;  Surgeon: Newt Minion, MD;  Location: [place];  Service: Orthopedics;  Laterality: Right;  . BELOW KNEE LEG AMPUTATION Right 09/17/2018  . CORONARY ANGIOPLASTY  03/05/2008   stent  . FOOT SURGERY    . PERIPHERAL ARTERIAL STENT GRAFT Right 2014   leg 2 stents- clotted per pat  . STENT PLACEMENT VASCULAR ([place] [facility])     Several in right leg  . TONSILLECTOMY    . TRANSMETATARSAL AMPUTATION Right 07/23/2018    Assessment & Plan Clinical Impression: [name] a 58 y.o. year old male with recent admission to the hospital on with  09/17/2018 with progressive dehiscence ischemic changes of recent right TMA amputation. 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 Sharol Given with application of wound VAC. Hospital course pain management.  Patient transferred to CIR on 09/19/2018 .    Patient currently requires min with basic self-care skills secondary to muscle weakness and decreased standing balance and decreased balance strategies.  Prior to hospitalization, patient could complete ADLs with modified independent .  Patient will benefit from skilled intervention to  decrease level of assist with basic self-care skills and increase independence with basic self-care skills prior to discharge home independently.  Anticipate patient will require intermittent supervision and follow up home health.  OT - End of Session Endurance Deficit: Yes Endurance Deficit Description: increased work of breathing w/ all mobility, increased energy demands w/ recent R BKA OT Assessment Rehab Potential (ACUTE ONLY): Excellent OT Barriers to Discharge: Decreased caregiver support OT Barriers to Discharge Comments: Lives alone OT Patient demonstrates impairments in the following area(s): Balance;Sensory;Endurance;Safety;Pain OT Basic ADL's Functional Problem(s): Grooming;Bathing;Dressing;Toileting OT Advanced ADL's Functional Problem(s): Simple Meal Preparation;Light Housekeeping OT Transfers Functional Problem(s): Toilet;Tub/Shower OT Additional Impairment(s): None OT Plan OT Intensity: Minimum of 1-2 x/day, 45 to 90 minutes OT Frequency: 5 out of 7 days OT Duration/Estimated Length of Stay: 9-12 days OT Treatment/Interventions: Balance/vestibular training;Discharge planning;Community reintegration;Disease Lawyer;Functional mobility training;Pain management;Psychosocial support;Patient/family education;Neuromuscular re-education;Self Care/advanced ADL retraining;Therapeutic Exercise;UE/LE Coordination activities;Wheelchair propulsion/positioning;Skin care/wound managment;Therapeutic Activities;UE/LE Strength taining/ROM OT Self Feeding Anticipated Outcome(s): independent OT Basic Self-Care Anticipated Outcome(s): modified independent OT Toileting Anticipated Outcome(s): modified independent OT Bathroom Transfers Anticipated Outcome(s): modified independent OT Recommendation Recommendations for Other Services: Neuropsych consult Patient destination: Home Follow Up Recommendations: Home health OT Equipment Recommended: 3 in 1  bedside comode;Tub/shower bench   Skilled Therapeutic Intervention Began education on selfcare retraining sit to stand from the bed.  He was able to complete sit to stand with min assist.  He was able to complete UB bathing and dressing with supervision.  Min assist for sit to stand with the RW with mod instructional cueing for hand placement with sit to stand.  Min assist as well for stand pivot transfer to the wheelchair.  Pt left in the wheelchair with call button and phone in reach and safety alarm belt in place.    OT Evaluation Precautions/Restrictions  Precautions Precautions: Fall Precaution Comments: precautions/positioning reviewed with pt Restrictions Weight Bearing Restrictions: Yes RLE Weight Bearing: Non weight bearing   Pain  Pain 6/10 in the R residual limb  Home Living/Prior Functioning Home Living Available Help at Discharge: Friend(s), Available PRN/intermittently Type of Home: Apartment Home Access: Level entry Home Layout: One level Bathroom Shower/Tub: Chiropodist: Standard Bathroom Accessibility: Yes Additional Comments: Pt reports friends will come to drop him off home but may not receive much help in home setting unless he pays someone to come help.  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, on short term disability at this point Comments: Was using knee scooter for mobility ADL ADL Eating: Independent Where  Assessed-Eating: Edge of bed Grooming: Setup Where Assessed-Grooming: Wheelchair Upper Body Bathing: Setup Where Assessed-Upper Body Bathing: Wheelchair Lower [employer]  Bathing: Minimal assistance Where Assessed-Lower Body Bathing: Wheelchair Upper Body Dressing: Supervision/safety Where Assessed-Upper Body Dressing: Edge of bed Lower Body Dressing: Minimal assistance Where Assessed-Lower Body Dressing: Standing at sink, Sitting at sink, Wheelchair Toileting: Minimal assistance Where Assessed-Toileting: Bedside Commode Toilet Transfer: Minimal assistance Toilet Transfer Method: Arts development officer: Bedside commode Vision Baseline Vision/History: No visual deficits Patient Visual Report: No change from baseline Perception  Perception: Within Functional Limits Praxis Praxis: Intact Cognition Overall Cognitive Status: Within Functional Limits for tasks assessed Arousal/Alertness: Awake/alert Orientation Level: Person;Place;Situation Person: Oriented Place: Oriented Situation: Oriented Year: 2020 Month: March Day of Week: Correct Memory: Appears intact Immediate Memory Recall: Sock;Bed Memory Recall: Sock;Blue;Bed Memory Recall Sock: Without Cue Memory Recall Blue: Without Cue Memory Recall Bed: Without Cue Attention: Sustained;Selective Selective Attention: Appears intact Awareness: Appears intact Problem Solving: Appears intact Safety/Judgment: Appears intact Sensation Sensation Light Touch: Appears Intact Proprioception: Impaired Detail(Pt reports peripheral proprioceptive deficits since CVA in May of 2019) Proprioception Impaired Details: Impaired RUE Stereognosis: Appears Intact Coordination Gross Motor Movements are Fluid and Coordinated: Yes Fine Motor Movements are Fluid and Coordinated: Yes Motor  Motor Motor: Within Functional Limits Motor - Skilled Clinical Observations: generalized weakness Mobility  Bed Mobility Bed Mobility: Rolling  Right;Rolling Left;Supine to Sit;Sit to Supine Rolling Right: Supervision/verbal cueing Rolling Left: Supervision/Verbal cueing Supine to Sit: Supervision/Verbal cueing Sit to Supine: Supervision/Verbal cueing Transfers Sit to Stand: Minimal Assistance - Patient > 75% Stand to Sit: Minimal Assistance - Patient > 75%  Trunk/Postural Assessment  Cervical Assessment Cervical Assessment: Within Functional Limits Thoracic Assessment Thoracic Assessment: Within Functional Limits Lumbar Assessment Lumbar Assessment: Within Functional Limits Postural Control Postural Control: Within Functional Limits  Balance Balance Balance Assessed: Yes Static Sitting Balance Static Sitting - Balance Support: No upper extremity supported;Feet supported Static Sitting - Level of Assistance: 7: Independent Dynamic Sitting Balance Dynamic Sitting - Balance Support: No upper extremity supported;During functional activity Dynamic Sitting - Level of Assistance: 5: Stand by assistance Static Standing Balance Static Standing - Balance Support: Bilateral upper extremity supported;During functional activity Static Standing - Level of Assistance: 4: Min assist Dynamic Standing Balance Dynamic Standing - Balance Support: During functional activity;Bilateral upper extremity supported Dynamic Standing - Level of Assistance: 4: Min assist Extremity/Trunk Assessment RUE Assessment RUE Assessment: Within Functional Limits LUE Assessment LUE Assessment: Within Functional Limits     Refer to Care Plan for Long Term Goals  Recommendations for other services: Neuropsych   Discharge Criteria: Patient will be discharged from OT if patient refuses treatment 3 consecutive times without medical reason, if treatment goals not met, if there is a change in medical status, if patient makes no progress towards goals or if patient is discharged from hospital.  The above assessment, treatment plan, treatment alternatives and  goals were discussed and mutually agreed upon: by patient  Tristen Pennino OTR/L 09/20/2018, 4:04 PM

## 2018-09-20 NOTE — Progress Notes (Signed)
[place] PHYSICAL MEDICINE & REHABILITATION PROGRESS NOTE   Subjective/Complaints:  Per pt was unable to get pain med for >7 hrs last noc, took his own OxyIR 5mg .  Per RN pt refused to give up his supply Pt also concerned that wound vac is not draining properly, RN checked and suction was normal, we discussed that most surgeons do no use wound vac, lack of drainage is not a "bad sign"  \ROS- denies CP SOB, N/V/D  Objective:   No results found. No results for input(s): WBC, HGB, HCT, PLT in the last 72 hours. No results for input(s): NA, K, CL, CO2, GLUCOSE, BUN, CREATININE, CALCIUM in the last 72 hours.  Intake/Output Summary (Last 24 hours) at 09/20/2018 0808 Last data filed at 09/19/2018 1900 Gross per 24 hour  Intake 120 ml  Output 675 ml  Net -555 ml     Physical Exam: Vital Signs Blood pressure 127/64, pulse 83, temperature 98.6 F (37 C), temperature source Oral, resp. rate 14, SpO2 96 %.   General: No acute distress Mood and affect are appropriate Heart: Regular rate and rhythm no rubs murmurs or extra sounds Lungs: Clear to auscultation, breathing unlabored, no rales or wheezes Abdomen: Positive bowel sounds, soft nontender to palpation, nondistended Extremities: No clubbing, cyanosis, or edema Skin: No evidence of breakdown, no evidence of rash Neurologic: Cranial nerves II through XII intact, motor strength is 5/5 in bilateral deltoid, bicep, tricep, grip, hip flexor, knee extensors, ankle dorsiflexor and plantar flexor   Musculoskeletal:Right BKA with wound vac,  Assessment/Plan: 1. Functional deficits secondary to RIght BKA which require 3+ hours per day of interdisciplinary therapy in a comprehensive inpatient rehab setting.  Physiatrist is providing close team supervision and 24 hour management of active medical problems listed below.  Physiatrist and rehab team continue to assess barriers to discharge/monitor patient progress toward functional and medical  goals  Care Tool:  Bathing              Bathing assist       Upper Body Dressing/Undressing Upper body dressing        Upper body assist      Lower Body Dressing/Undressing Lower body dressing            Lower body assist       Toileting Toileting    Toileting assist       Transfers Chair/bed transfer  Transfers assist           Locomotion Ambulation   Ambulation assist              Walk 10 feet activity   Assist           Walk 50 feet activity   Assist           Walk 150 feet activity   Assist           Walk 10 feet on uneven surface  activity   Assist           Wheelchair     Assist               Wheelchair 50 feet with 2 turns activity    Assist            Wheelchair 150 feet activity     Assist          Medical Problem List and Plan: 1.Decreased functional mobilitysecondary to right BKA 09/17/2018 with wound VAC after dehiscence of TMA amputation January 2020 -CIR  OT, PT -spent extensive time reviewing pain meds , also received 2 med bottles, 1 robaxin, about half full and one Oxy IR 5mg  in a bottle RN states that there were additional pills in that bottle last noc  process/prosthesis timing.2. Antithrombotics: -DVT/anticoagulation:SCD left lower extremity -antiplatelet therapy: aspirin 81 mg daily, Plavix 75 mg daily 3. Pain Management:Neurontin 300 mg 3 times a day -oxycodone and Robaxin as needed Add long acting oxy CR at noc 4. Mood:Provide emotional support -antipsychotic agents: N/A 5. Neuropsych: This patientiscapable of making decisions on hisown behalf. 6. Skin/Wound Care:Routine skin checks 7. Fluids/Electrolytes/Nutrition:Routine ins and outs with follow-up chemistries 8. Diabetes mellitus. Hemoglobin A1c 7.5. -Lantus insulin 30 units daily at  bedtime -Tradjenta 5 mg daily. - CBG (last 3)  Recent Labs    09/19/18 1725 09/19/18 2158 09/20/18 0645  GLUCAP 105* 202* 84  controlled 3/28 9. CAD with stenting. Continue aspirin and Plavix. No chest pain or shortness of breath 10. History of seizures. Keppra 500 mg daily. 11.CKD stage III. Creatinine baseline 1.35-1.80. Follow-up chemistries 12. Hyperlipidemia.Zetia10 mg daily, Lipitor 20 mg daily 13. History of tobacco abuse. Provide counseling 14. Constipation. Laxative assistance    LOS: 1 days A FACE TO FACE EVALUATION WAS PERFORMED  Erick Colace 09/20/2018, 8:08 AM

## 2018-09-20 NOTE — Progress Notes (Signed)
Occupational Therapy Session Note  Patient Details  Name: Curtis Bradford MRN: [id number] Date of Birth: 11/14/1960  Today's Date: 09/20/2018 OT Individual Time: 4239-5320 OT Individual Time Calculation (min): 45 min    Short Term Goals: Week 1:  OT Short Term Goal 1 (Week 1): Pt will complete bathing sit to stand with supervision. OT Short Term Goal 2 (Week 1): Pt will complete LB dressing sit to stand with supervision. OT Short Term Goal 3 (Week 1): Pt will complete toilet transfer supervision using the RW. OT Short Term Goal 4 (Week 1): Pt complete tub shower transfer with supervision using the RW and tub bench.    Skilled Therapeutic Interventions/Progress Updates:  Pt completed stand pivot transfer from bed to the wheelchair with min assist using the RW for support.  He then complete wheelchair mobility down to the tub room with supervision using BUEs.  He then completed tub/shower transfer with use of the tub bench and RW with min assist.  Educated pt on the need for a hand held shower as well as for removing throw rugs for safety.  He completed transfer back to the room and to the bed with min assist squat pivot with min contact assist.  Finished session with call button and phone in reach and bed alarm in place.     Therapy Documentation Precautions:  Precautions Precautions: Fall Precaution Comments: precautions/positioning reviewed with pt Restrictions Weight Bearing Restrictions: Yes RLE Weight Bearing: Non weight bearing  Pain: Pain Assessment Pain Scale: 0-10 Pain Score: 6  Faces Pain Scale: Hurts a little bit Pain Type: Surgical pain;Phantom pain Pain Location: Leg Pain Orientation: Right Pain Descriptors / Indicators: Discomfort;Aching Pain Frequency: Intermittent Pain Onset: On-going Patients Stated Pain Goal: 2 Pain Intervention(s): Medication (See eMAR)   Therapy/Group: Individual Therapy  Jeric Slagel OTR/L 09/20/2018, 4:29 PM

## 2018-09-20 NOTE — Progress Notes (Signed)
Patient noted by writer to have his personal home medications ( Oxycodone and Robaxin) in his luggage at bedside  Explained system policy related to  Personal medication at bedside, especially Narcotic medication, explained also by the CN. Patient states he understood but will keep his meds with him. House [facility] Onalee Hua) also was informed .On call MD will be noticed on round. Marland Kitchen

## 2018-09-21 ENCOUNTER — Other Ambulatory Visit: Payer: Self-pay | Admitting: Endocrinology

## 2018-09-21 ENCOUNTER — Inpatient Hospital Stay (HOSPITAL_COMMUNITY): Payer: Managed Care, Other (non HMO) | Admitting: Occupational Therapy

## 2018-09-21 ENCOUNTER — Inpatient Hospital Stay (HOSPITAL_COMMUNITY): Payer: Managed Care, Other (non HMO) | Admitting: Physical Therapy

## 2018-09-21 LAB — GLUCOSE, CAPILLARY
GLUCOSE-CAPILLARY: 137 mg/dL — AB (ref 70–99)
Glucose-Capillary: 123 mg/dL — ABNORMAL HIGH (ref 70–99)
Glucose-Capillary: 142 mg/dL — ABNORMAL HIGH (ref 70–99)
Glucose-Capillary: 132 mg/dL — ABNORMAL HIGH (ref 70–99)

## 2018-09-21 NOTE — Progress Notes (Signed)
[place] PHYSICAL MEDICINE & REHABILITATION PROGRESS NOTE   Subjective/Complaints:  Slipped in BR yesterday pm, lowered slowly to floor onto buttocks without injury  \ROS- denies CP SOB, N/V/D  Objective:   No results found. No results for input(s): WBC, HGB, HCT, PLT in the last 72 hours. No results for input(s): NA, K, CL, CO2, GLUCOSE, BUN, CREATININE, CALCIUM in the last 72 hours.  Intake/Output Summary (Last 24 hours) at 09/21/2018 0803 Last data filed at 09/21/2018 0528 Gross per 24 hour  Intake 360 ml  Output 1350 ml  Net -990 ml     Physical Exam: Vital Signs Blood pressure 131/61, pulse 78, temperature 98.3 F (36.8 C), temperature source Oral, resp. rate 18, SpO2 100 %.   General: No acute distress Mood and affect are appropriate Heart: Regular rate and rhythm no rubs murmurs or extra sounds Lungs: Clear to auscultation, breathing unlabored, no rales or wheezes Abdomen: Positive bowel sounds, soft nontender to palpation, nondistended Extremities: No clubbing, cyanosis, or edema Skin: No evidence of breakdown, no evidence of rash Neurologic: Cranial nerves II through XII intact, motor strength is 5/5 in bilateral deltoid, bicep, tricep, grip, hip flexor, knee extensors, ankle dorsiflexor and plantar flexor   Musculoskeletal:Right BKA with wound vac,  Assessment/Plan: 1. Functional deficits secondary to RIght BKA which require 3+ hours per day of interdisciplinary therapy in a comprehensive inpatient rehab setting.  Physiatrist is providing close team supervision and 24 hour management of active medical problems listed below.  Physiatrist and rehab team continue to assess barriers to discharge/monitor patient progress toward functional and medical goals  Care Tool:  Bathing    Body parts bathed by patient: Right arm, Left arm, Chest, Abdomen, Front perineal area, Buttocks, Left lower leg, Right upper leg, Face, Left upper leg     Body parts n/a: Right  lower leg   Bathing assist Assist Level: Minimal Assistance - Patient > 75%     Upper Body Dressing/Undressing Upper body dressing   What is the patient wearing?: Pull over shirt    Upper body assist Assist Level: Supervision/Verbal cueing    Lower Body Dressing/Undressing Lower body dressing      What is the patient wearing?: Pants     Lower body assist Assist for lower body dressing: Minimal Assistance - Patient > 75%     Toileting Toileting    Toileting assist Assist for toileting: Minimal Assistance - Patient > 75%     Transfers Chair/bed transfer  Transfers assist     Chair/bed transfer assist level: Minimal Assistance - Patient > 75%     Locomotion Ambulation   Ambulation assist   Ambulation activity did not occur: Safety/medical concerns          Walk 10 feet activity   Assist  Walk 10 feet activity did not occur: Safety/medical concerns        Walk 50 feet activity   Assist Walk 50 feet with 2 turns activity did not occur: Safety/medical concerns         Walk 150 feet activity   Assist Walk 150 feet activity did not occur: Safety/medical concerns         Walk 10 feet on uneven surface  activity   Assist Walk 10 feet on uneven surfaces activity did not occur: Safety/medical concerns         Wheelchair     Assist Will patient use wheelchair at discharge?: Yes Type of Wheelchair: Manual    Wheelchair assist level: Supervision/Verbal  cueing Max wheelchair distance: 150'    Wheelchair 50 feet with 2 turns activity    Assist        Assist Level: Supervision/Verbal cueing   Wheelchair 150 feet activity     Assist     Assist Level: Supervision/Verbal cueing    Medical Problem List and Plan: 1.Decreased functional mobilitysecondary to right BKA 09/17/2018 with wound VAC after dehiscence of TMA amputation January 2020 -CIR OT, PT reviewed "lessons from near fall" 2.  Antithrombotics: -DVT/anticoagulation:SCD left lower extremity -antiplatelet therapy: aspirin 81 mg daily, Plavix 75 mg daily 3. Pain Management:Neurontin 300 mg 3 times a day --OxyCR 10mg  qhs helpful for sleep, Oxy IR 5-10mg  q6prn Add long acting oxy CR at noc 4. Mood:Provide emotional support- rec Neuropsych for adjustment -antipsychotic agents: N/A 5. Neuropsych: This patientiscapable of making decisions on hisown behalf. 6. Skin/Wound Care:Routine skin checks 7. Fluids/Electrolytes/Nutrition:Routine ins and outs with follow-up chemistries 8. Diabetes mellitus. Hemoglobin A1c 7.5. -Lantus insulin 30 units daily at bedtime -Tradjenta 5 mg daily. - CBG (last 3)  Recent Labs    09/20/18 1729 09/20/18 2111 09/21/18 0639  GLUCAP 125* 147* 137*  controlled 3/29 9. CAD with stenting. Continue aspirin and Plavix. No chest pain or shortness of breath 10. History of seizures. Keppra 500 mg daily. 11.CKD stage III. Creatinine baseline 1.35-1.80. Follow-up chemistries 12. Hyperlipidemia.Zetia10 mg daily, Lipitor 20 mg daily 13. History of tobacco abuse. Provide counseling 14. Constipation. Laxative assistance    LOS: 2 days A FACE TO FACE EVALUATION WAS PERFORMED  Erick Colace 09/21/2018, 8:03 AM

## 2018-09-22 ENCOUNTER — Inpatient Hospital Stay (HOSPITAL_COMMUNITY): Payer: Managed Care, Other (non HMO) | Admitting: Occupational Therapy

## 2018-09-22 ENCOUNTER — Inpatient Hospital Stay (HOSPITAL_COMMUNITY): Payer: Managed Care, Other (non HMO) | Admitting: Physical Therapy

## 2018-09-22 ENCOUNTER — Inpatient Hospital Stay (HOSPITAL_COMMUNITY): Payer: Managed Care, Other (non HMO)

## 2018-09-22 DIAGNOSIS — E46 Unspecified protein-calorie malnutrition: Secondary | ICD-10-CM

## 2018-09-22 DIAGNOSIS — M792 Neuralgia and neuritis, unspecified: Secondary | ICD-10-CM

## 2018-09-22 DIAGNOSIS — D62 Acute posthemorrhagic anemia: Secondary | ICD-10-CM

## 2018-09-22 DIAGNOSIS — R569 Unspecified convulsions: Secondary | ICD-10-CM

## 2018-09-22 DIAGNOSIS — G8918 Other acute postprocedural pain: Secondary | ICD-10-CM

## 2018-09-22 DIAGNOSIS — E119 Type 2 diabetes mellitus without complications: Secondary | ICD-10-CM

## 2018-09-22 DIAGNOSIS — E8809 Other disorders of plasma-protein metabolism, not elsewhere classified: Secondary | ICD-10-CM

## 2018-09-22 LAB — CBC WITH DIFFERENTIAL/PLATELET
Abs Immature Granulocytes: 0.07 10*3/uL (ref 0.00–0.07)
Basophils Absolute: 0.1 10*3/uL (ref 0.0–0.1)
Basophils Relative: 1 %
EOS ABS: 0.5 10*3/uL (ref 0.0–0.5)
Eosinophils Relative: 5 %
HCT: 31.5 % — ABNORMAL LOW (ref 39.0–52.0)
Hemoglobin: 10 g/dL — ABNORMAL LOW (ref 13.0–17.0)
Immature Granulocytes: 1 %
Lymphocytes Relative: 19 %
Lymphs Abs: 1.9 10*3/uL (ref 0.7–4.0)
MCH: 28.9 pg (ref 26.0–34.0)
MCHC: 31.7 g/dL (ref 30.0–36.0)
MCV: 91 fL (ref 80.0–100.0)
Monocytes Absolute: 1.2 10*3/uL — ABNORMAL HIGH (ref 0.1–1.0)
Monocytes Relative: 12 %
Neutro Abs: 6.2 10*3/uL (ref 1.7–7.7)
Neutrophils Relative %: 62 %
Platelets: 375 10*3/uL (ref 150–400)
RBC: 3.46 MIL/uL — ABNORMAL LOW (ref 4.22–5.81)
RDW: 12.7 % (ref 11.5–15.5)
WBC: 9.9 10*3/uL (ref 4.0–10.5)
nRBC: 0 % (ref 0.0–0.2)

## 2018-09-22 LAB — COMPREHENSIVE METABOLIC PANEL
ALT: 17 U/L (ref 0–44)
AST: 20 U/L (ref 15–41)
Albumin: 3 g/dL — ABNORMAL LOW (ref 3.5–5.0)
Alkaline Phosphatase: 70 U/L (ref 38–126)
Anion gap: 8 (ref 5–15)
BUN: 17 mg/dL (ref 6–20)
CO2: 25 mmol/L (ref 22–32)
Calcium: 8.7 mg/dL — ABNORMAL LOW (ref 8.9–10.3)
Chloride: 103 mmol/L (ref 98–111)
Creatinine, Ser: 1.48 mg/dL — ABNORMAL HIGH (ref 0.61–1.24)
GFR calc Af Amer: >60 mL/min (ref >60)
GFR calc non Af Amer: 52 mL/min — ABNORMAL LOW (ref >60)
Glucose, Bld: 140 mg/dL — ABNORMAL HIGH (ref 70–99)
Potassium: 4.1 mmol/L (ref 3.5–5.1)
Sodium: 136 mmol/L (ref 135–145)
TOTAL PROTEIN: 5.9 g/dL — AB (ref 6.5–8.1)
Total Bilirubin: 0.3 mg/dL (ref 0.3–1.2)

## 2018-09-22 LAB — GLUCOSE, CAPILLARY
Glucose-Capillary: 147 mg/dL — ABNORMAL HIGH (ref 70–99)
Glucose-Capillary: 87 mg/dL (ref 70–99)
Glucose-Capillary: 119 mg/dL — ABNORMAL HIGH (ref 70–99)
Glucose-Capillary: 119 mg/dL — ABNORMAL HIGH (ref 70–99)

## 2018-09-22 MED ORDER — PRO-STAT SUGAR FREE PO LIQD
30.0000 mL | Freq: Two times a day (BID) | ORAL | Status: DC
  Administered 2018-09-22 – 2018-10-03 (×23): 30 mL via ORAL
  Filled 2018-09-22 (×22): qty 30

## 2018-09-22 NOTE — IPOC Note (Signed)
Overall Plan of Care [facility]) Patient Details Name: Curtis Bradford MRN: [id number] DOB: 1960-09-19  Admitting Diagnosis: <principal problem not specified>  Hospital Problems: Active Problems:   Right below-knee amputee (HCC)   Acute blood loss anemia   Hypoalbuminemia due to protein-calorie malnutrition (HCC)   Diabetes mellitus type 2 in nonobese (HCC)   Postoperative pain   Neuropathic pain   Seizures (HCC)     Functional Problem List: Nursing Behavior, Bladder, Bowel, Edema, Endurance, Medication Management, Pain, Safety, Skin Integrity  PT Endurance, Balance, Pain, Safety, Skin Integrity, Sensory  OT Balance, Sensory, Endurance, Safety, Pain  SLP    TR         Basic ADL's: OT Grooming, Bathing, Dressing, Toileting     Advanced  ADL's: OT Simple Meal Preparation, Light Housekeeping     Transfers: PT Bed Mobility, Furniture, Floor, Bed to Chair, Customer service manager, Research scientist (life sciences): PT Psychologist, prison and probation services, Ambulation     Additional Impairments: OT None  SLP        TR      Anticipated Outcomes Item Anticipated Outcome  Self Feeding independent  Swallowing      Basic self-care  modified independent  Toileting  modified independent   Bathroom Transfers modified independent  Bowel/Bladder  patient will be continent of bowel and bladder with min assist  Transfers  modified independent  Locomotion  modified independent household gait and w/c locomotion  Communication     Cognition     Pain  pain will be equal to or less than 4/10 with min assist  Safety/Judgment  patient will be free from falls/injury and making sound safety decisions   Therapy Plan: PT Intensity: Minimum of 1-2 x/day ,45 to 90 minutes PT Frequency: 5 out of 7 days PT Duration Estimated Length of Stay: 9-12 days OT Intensity: Minimum of 1-2 x/day, 45 to 90 minutes OT Frequency: 5 out of 7 days OT Duration/Estimated Length of Stay: 9-12 days      Team  Interventions: Nursing Interventions Patient/Family Education, Pain Management, Bladder Management, Medication Management, Bowel Management, Skin Care/Wound Management, Discharge Planning  PT interventions Ambulation/gait training, Balance/vestibular training, Community reintegration, Discharge planning, Disease management/prevention, DME/adaptive equipment instruction, Functional electrical stimulation, Functional mobility training, Neuromuscular re-education, Pain management, Patient/family education, Psychosocial support, Skin care/wound management, Splinting/orthotics, Therapeutic Activities, Therapeutic Exercise, UE/LE Strength taining/ROM, UE/LE Coordination activities, Wheelchair propulsion/positioning  OT Interventions Warden/ranger, Discharge planning, Community reintegration, Disease mangement/prevention, Fish farm manager, Functional mobility training, Pain management, Psychosocial support, Patient/family education, Neuromuscular re-education, Self Care/advanced ADL retraining, Therapeutic Exercise, UE/LE Coordination activities, Wheelchair propulsion/positioning, Skin care/wound managment, Therapeutic Activities, UE/LE Strength taining/ROM  SLP Interventions    TR Interventions    SW/CM Interventions Discharge Planning, Psychosocial Support, Patient/Family Education   Barriers to Discharge MD  Medical stability, Wound care and Weight bearing restrictions  Nursing Wound Care    PT Decreased caregiver support, Wound Care History of non-healing amputation incisions  OT Decreased caregiver support Lives alone  SLP      SW Decreased caregiver support Does not have 24 hr care at home   Team Discharge Planning: Destination: PT-Home ,OT- Home , SLP-  Projected Follow-up: PT-Home health PT, OT-  Home health OT, SLP-  Projected Equipment Needs: PT-To be determined, OT- 3 in 1 bedside comode, Tub/shower bench, SLP-  Equipment Details: PT- , OT-   Patient/family involved in discharge planning: PT- Patient,  OT-Patient, SLP-   MD ELOS: 7-10 days. Medical Rehab Prognosis:  Good  Assessment: 58 year old right-handed male with history of CAD with stenting maintained on aspirin and Plavix,CKD stage III, diabetes mellitus, hypertension,CVA 2012, tobacco abuse, seizure disorder maintained on Keppra. 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 Presented 09/17/2018 with progressive dehiscence ischemic changes of recent right TMA amputation. 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 Lajoyce Corners with application of wound VAC. Hospital course pain management. Patient remains on aspirin and Plavix as prior to admission.  Patient with resulting functional deficits with mobility, transfers, self-care.  We will set goals for mod I with PT/OT.  See Team Conference Notes for weekly updates to the plan of care

## 2018-09-22 NOTE — Progress Notes (Signed)
Occupational Therapy Session Note  Patient Details  Name: Curtis Bradford MRN: [id number] Date of Birth: 1961-02-15  Today's Date: 09/22/2018 OT Individual Time: 3419-3790 OT Individual Time Calculation (min): 90 min    Short Term Goals: Week 1:  OT Short Term Goal 1 (Week 1): Pt will complete bathing sit to stand with supervision. OT Short Term Goal 2 (Week 1): Pt will complete LB dressing sit to stand with supervision. OT Short Term Goal 3 (Week 1): Pt will complete toilet transfer supervision using the RW. OT Short Term Goal 4 (Week 1): Pt complete tub shower transfer with supervision using the RW and tub bench.    Skilled Therapeutic Interventions/Progress Updates:    Pt received in w/c, discussed the causes of his fall in the bathroom and pt felt it was caused by a wet floor due to the gap between [facility] seat and toilet seat.  Practiced transfers to and from [facility] over toilet with min A and then without it with mod A due to low seat.  Obtained a different BSC with a drop arm and a splash guard. With this commode, pt was able to to complete a squat pivot 3x back and forth from w/c with close S.   He tried one time with standing, but required min-mod A without use of the RW.  Recommended to pt to only do squat pivots with nursing staff at this time and therapy can practice with use of the RW.   Pt also practiced doffing and donning clothing over hips using lateral leans and self cleansing with lateral leans/ weight shift.  Recommended to pt that he not try to stand up and manage his clothing at this time.   Reviewed the need for him to have staff with him for the transfer and with his clothing management to prevent a fall.   Pt sat at sink to complete grooming and bathing.  I with UB self care from w/c level.  After shaving, pt had 3 small nicks possibly from his inexpensive razor and dry skin.  Applied a few bandaids.  Pt only bathed UB and donned a new shirt. He stated he bathed LB yesterday well  and had just put on clean clothing this morning.  Pt is very talkative and often needs guidance to refocus on what tasks he needs to get finished.  Otherwise, he participated well today.   Pt transferred to EOB to sit to prepare for lunch. Bed alarm on and all needs met.  Therapy Documentation Precautions:  Precautions Precautions: Fall Precaution Comments: precautions/positioning reviewed with pt Restrictions Weight Bearing Restrictions: Yes RLE Weight Bearing: Non weight bearing   Pain: Pain Assessment Pain Scale: 0-10 Pain Score: 3  Pain Type: Acute pain Pain Location: Leg Pain Orientation: Right Pain Descriptors / Indicators: Throbbing Pain Frequency: Intermittent Pain Onset: With Activity Patients Stated Pain Goal: 3 Pain Intervention(s): Medication (See eMAR) ADL: ADL Eating: Independent Where Assessed-Eating: Edge of bed Grooming: Setup Where Assessed-Grooming: Wheelchair Upper Body Bathing: Setup Where Assessed-Upper Body Bathing: Wheelchair Lower Body Bathing: Minimal assistance Where Assessed-Lower Body Bathing: Wheelchair Upper Body Dressing: Supervision/safety Where Assessed-Upper Body Dressing: Edge of bed Lower Body Dressing: Minimal assistance Where Assessed-Lower Body Dressing: Standing at sink, Sitting at sink, Wheelchair Toileting: Minimal assistance Where Assessed-Toileting: Bedside Commode Toilet Transfer: Minimal assistance Toilet Transfer Method: Stand pivot Toilet Transfer Equipment: Bedside commode   Therapy/Group: Individual Therapy  [place] 09/22/2018, 10:44 AM

## 2018-09-22 NOTE — Progress Notes (Signed)
Physical Therapy Session Note  Patient Details  Name: Curtis Bradford MRN: [id number] Date of Birth: [birth year]  Today's Date: 09/22/2018 PT Individual Time: 0800-0900 PT Individual Time Calculation (min): 60 min   Short Term Goals: Week 1:  PT Short Term Goal 1 (Week 1): Pt will perform bed<>chair transfers w/ supervision PT Short Term Goal 2 (Week 1): Pt will ambulate 4' in household environment w/ min assist PT Short Term Goal 3 (Week 1): Pt will maintain dynamic standing balance w/ supervision  Skilled Therapeutic Interventions/Progress Updates:  Pt resting in bed.  He stated that pain is 4/10,  tightness R calf.  He had pain meds about 1 hr ago.  Bed mobility with supervision to sit up. Pt wearing shrinker sock R residual limb. Pt donned L shoe with supervision.  Seated Therapeutic exercise performed with LE to increase strength for functional mobility: 10 x 1 R short arc quad knee extensions, bil hip adductor squeezes.    Sitting balance practice reaching out of BOS with R hand in all directions with supervision.  Squat pivot transfer bed> w/c to r with CGA.  W/c propulsion using bil UEs and LLE spontaneously.  When using LLE, pt's residual limb moves around on pad of leg rest.  PT suggested pt use bil UEs only, with good results.  Training for w/c parts mgt from w/c level: min assist and mod cues for managing leg rests, VCS for brakes.  Gait training straight path x 50' on level tile with RW, min assist.  Pt is chatty, and at end of walk, his L toes caught briefly on floor.  He recovered balance.  PT advised him to avoid talking while ambulating at this point.  Pt left resting in w/c with seat belt alarm set, propelling w/c around unit.      Therapy Documentation Precautions:  Precautions Precautions: Fall Precaution Comments: precautions/positioning reviewed with pt Restrictions Weight Bearing Restrictions: Yes RLE Weight Bearing: Non weight bearing   Pain: Pain  Assessment Pain Scale: 0-10 Pain Score: 2  Pain Type: Acute pain Pain Location: Leg Pain Orientation: Right Pain Intervention(s): Medication (See eMAR)      Therapy/Group: Individual Therapy  Curtis Bradford 09/22/2018, 10:21 AM

## 2018-09-22 NOTE — Care Management Note (Signed)
Inpatient Rehabilitation Center Individual Statement of Services  Patient Name:  Curtis Bradford  Date:  09/22/2018  Welcome to the Inpatient Rehabilitation Center.  Our goal is to provide you with an individualized program based on your diagnosis and situation, designed to meet your specific needs.  With this comprehensive rehabilitation program, you will be expected to participate in at least 3 hours of rehabilitation therapies Monday-Friday, with modified therapy programming on the weekends.  Your rehabilitation program will include the following services:  Physical Therapy (PT), Occupational Therapy (OT), 24 hour per day rehabilitation nursing, Therapeutic Recreaction (TR), Neuropsychology, Case Management (Social Worker), Rehabilitation Medicine, Nutrition Services and Pharmacy Services  Weekly team conferences will be held on Wednesday to discuss your progress.  Your Social Worker will talk with you frequently to get your input and to update you on team discussions.  Team conferences with you and your family in attendance may also be held.  Expected length of stay: 9-12 days  Overall anticipated outcome: independent with device  Depending on your progress and recovery, your program may change. Your Social Worker will coordinate services and will keep you informed of any changes. Your Social Worker's name and contact numbers are listed  below.  The following services may also be recommended but are not provided by the Inpatient Rehabilitation Center:   Driving Evaluations  Home Health Rehabiltiation Services  Outpatient Rehabilitation Services  Vocational Rehabilitation   Arrangements will be made to provide these services after discharge if needed.  Arrangements include referral to agencies that provide these services.  Your insurance has been verified to be:  [place] Your primary doctor is:  Gracy Bruins  Pertinent information will be shared with your doctor and your insurance  company.  Social Worker:  Dossie Der, SW [phone] or ([phone]  Information discussed with and copy given to patient by: Lucy Chris, 09/22/2018, 9:51 AM

## 2018-09-22 NOTE — Progress Notes (Signed)
Social Work  Social Work Assessment and Plan  Patient Details  Name: Curtis Bradford MRN: [id number] Date of Birth: [birth year]  Today's Date: 09/22/2018  Problem List:  Patient Active Problem List   Diagnosis Date Noted  . Right below-knee amputee (HCC) 09/19/2018  . Below knee amputation (HCC) 09/17/2018  . Gangrene of right foot (HCC)   . Status post transmetatarsal amputation of foot, right (HCC) 07/23/2018  . Osteomyelitis (HCC) 07/23/2018  . TIA (transient ischemic attack) 10/30/2017  . Medication monitoring encounter 03/28/2017  . Status post amputation of toe of right foot (HCC) 03/05/2017  . Subacute osteomyelitis, right ankle and foot (HCC)   . Peripheral neuropathy 02/16/2017  . Peripheral artery disease (HCC) 02/16/2017  . 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 Diabetic foot infection (HCC) 02/14/2017  . Type II diabetes mellitus with renal manifestations (HCC)   . HLD (hyperlipidemia)   . Tobacco abuse   . CKD stage 2 due to type 2 diabetes mellitus (HCC)   . CAD (coronary artery disease)   . Seizure disorder [facility])    Past Medical History:  Past Medical History:  Diagnosis Date  . CAD (coronary artery disease)   . CKD (chronic kidney disease), stage III (HCC)   . 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 History of DVT (deep vein thrombosis)   . History of kidney stones    passed stones - no surgery  . HLD (hyperlipidemia)   . Myocardial infarction (HCC) 2009  . Peripheral vascular disease (HCC)    right leg  . Seizure (HCC)    last one 2015  . Stroke [facility]) 2012, 10/2017   2012-speech was effected- has come back.10/2017- numbness of right side no taste right  side. 2012-  . Tobacco abuse    Past Surgical History:  Past Surgical History:  Procedure Laterality Date  . AMPUTATION Right 02/23/2017   Procedure: RIGHT FOOT 5TH RAY AMPUTATION;  Surgeon: [name], MD;  Location: [facility] OR;  Service: Orthopedics;   Laterality: Right;  . AMPUTATION Right 04/23/2018   Procedure: Right Great Toe Amputation;  Surgeon: [name], MD;  Location: [facility] OR;  Service: Orthopedics;  Laterality: Right;  . AMPUTATION Right 07/23/2018   Procedure: RIGHT TRANSMETATARSAL AMPUTATION, APPLY WOUND VAC;  Surgeon: [name], MD;  Location: [facility] OR;  Service: Orthopedics;  Laterality: Right;  . AMPUTATION Right 09/17/2018   Procedure: RIGHT AMPUTATION BELOW KNEE;  Surgeon: [name], MD;  Location: [facility] OR;  Service: Orthopedics;  Laterality: Right;  . APPLICATION OF WOUND VAC Right 09/17/2018   Procedure: Application Of Wound Vac;  Surgeon: [name], MD;  Location: [facility] OR;  Service: Orthopedics;  Laterality: Right;  . BELOW KNEE LEG AMPUTATION Right 09/17/2018  . CORONARY ANGIOPLASTY  03/05/2008   stent  . FOOT SURGERY    . PERIPHERAL ARTERIAL STENT GRAFT Right 2014   leg 2 stents- clotted per pat  . STENT PLACEMENT VASCULAR ([facility] HX)     Several in right leg  . TONSILLECTOMY    . TRANSMETATARSAL AMPUTATION Right 07/23/2018   Social History:  reports that he has been smoking cigarettes. He has a 3.75 pack-year smoking history. He has never used smokeless tobacco. He reports previous alcohol use. He reports that he does not use drugs.  Family / Support Systems Marital Status: Single Patient Roles: Other (Comment)(employee & son) Other Supports: [place] [employer] [phone]  [phone] Anticipated Caregiver: Friends  Ability/Limitations of Caregiver: Intermittent Caregiver Availability: Intermittent Family Dynamics: Pt has friends and Mother who are involved but will not be assisting him. They can provide intermittent assist for him. Made aware he will not be driving when he leaves the hospital  Social History Preferred language: English Religion: None Cultural Background: No issues Education: High School Read: Yes Write: Yes Employment Status: Employed Name of Employer: [employer] of Employment:  36 Legal History/Current Legal Issues: No issues Guardian/Conservator: None-according to MD pt is capable of making his own decisions while here   Abuse/Neglect Abuse/Neglect Assessment Can Be Completed: Yes Physical Abuse: Denies Verbal Abuse: Denies Sexual Abuse: Denies Exploitation of patient/patient's resources: Denies Self-Neglect: Denies  Emotional Status Pt's affect, behavior and adjustment status: Pt is motivated to recover and has always done for himself and feels he will be again. He is not one to ask for help from others. He is hoping he will be independent when he goes home. He does not have 24 hr care. Recent Psychosocial Issues: other health issues have not stopped him before Psychiatric History: No history deferred depression screen due to pt adjusting and learning the new unit. He may benefit from seeing neuro-psych while here. Will ask input form team Substance Abuse History: Tobacco unsure if he plans to quit when goes home  Patient / Family Perceptions, Expectations & Goals Pt/Family understanding of illness & functional limitations: Pt is able to explain his amputation and reason for it. He feels now he needs to focus on his recovery. He does talk with the MD daily and feels he has a good understanding of his condition and going forward his treatment plan. Premorbid pt/family roles/activities: Son, Financial controller, friend, etc Anticipated changes in roles/activities/participation: resume Pt/family expectations/goals: Pt states: " I want to get where I can manage on my own since I am home alone."  Community Resources Premorbid Home Care/DME Agencies: Other (Comment)([facility] saw last admit-06/2018) Transportation available at discharge: Self possibly freinds now Resource referrals recommended: Neuropsychology, Support group (specify)  Discharge Planning Living Arrangements: Alone Support Systems: Parent, Friends/neighbors Type of Residence: Private residence Insurance Resources:  Media planner (specify)([employer]) Financial Resources: Employment Financial Screen Referred: No Living Expenses: Psychologist, sport and exercise Management: Patient Does the patient have any problems obtaining your medications?: No Home Management: Self Patient/Family Preliminary Plans: Return home at a mod/i level, and have his friends and Mom check on him. Pt thought he would be getting prothesis before he left rehab. Informed him this would not be the case. Do see goals independent with device and ELOS 9-12 days. Will work on this with pt. Sw Barriers to Discharge: Decreased caregiver support Sw Barriers to Discharge Comments: Does not have 24 hr care at home Social Work Anticipated Follow Up Needs: HH/OP, Support Group  Clinical Impression Pleasant gentleman who is motivated to regain his independence while here. He has friends who will check on him along with his Mom. Goals to be independent with device upon discharge. Pt may benefit from seeing neuro-psych will get input form team.  Lucy Chris 09/22/2018, 9:49 AM

## 2018-09-22 NOTE — Progress Notes (Signed)
Inpatient Rehabilitation  Patient information reviewed and entered into eRehab system by Ashlee Player M. Harm Jou, M.A., CCC/SLP, PPS Coordinator.  Information including medical coding, functional ability and quality indicators will be reviewed and updated through discharge.    

## 2018-09-22 NOTE — Progress Notes (Addendum)
[place] PHYSICAL MEDICINE & REHABILITATION PROGRESS NOTE   Subjective/Complaints: Patient seen sitting up at the edge of his bed working with therapy this morning.  He states he slept well overnight.  He states he had a good weekend.  He is question about prognosis and return to work.  ROS: Denies CP SOB, N/V/D  Objective:   No results found. Recent Labs    09/22/18 0550  WBC 9.9  HGB 10.0*  HCT 31.5*  PLT 375   Recent Labs    09/22/18 0550  NA 136  K 4.1  CL 103  CO2 25  GLUCOSE 140*  BUN 17  CREATININE 1.48*  CALCIUM 8.7*    Intake/Output Summary (Last 24 hours) at 09/22/2018 1023 Last data filed at 09/22/2018 0744 Gross per 24 hour  Intake 980 ml  Output 1800 ml  Net -820 ml     Physical Exam: Vital Signs Blood pressure 138/69, pulse 74, temperature 98 F (36.7 C), temperature source Oral, resp. rate 18, SpO2 100 %. Constitutional: No distress . Vital signs reviewed. HENT: Normocephalic.  Atraumatic. Eyes: EOMI. No discharge. Cardiovascular: RRR. No JVD. Respiratory: CTA Bilaterally. Normal effort. GI: BS +. Non-distended. Musc: Right BKA. Neurologic: Alert and oriented Motor: 5/5 bilateral upper extremities and left lower extremity 5/5 right hip flexion Skin: Right BKA with VAC  Assessment/Plan: 1. Functional deficits secondary to RIght BKA which require 3+ hours per day of interdisciplinary therapy in a comprehensive inpatient rehab setting.  Physiatrist is providing close team supervision and 24 hour management of active medical problems listed below.  Physiatrist and rehab team continue to assess barriers to discharge/monitor patient progress toward functional and medical goals  Care Tool:  Bathing    Body parts bathed by patient: Right arm, Left arm, Chest, Abdomen, Front perineal area, Buttocks, Left lower leg, Right upper leg, Face, Left upper leg     Body parts n/a: Right lower leg   Bathing assist Assist Level: Minimal Assistance -  Patient > 75%     Upper Body Dressing/Undressing Upper body dressing   What is the patient wearing?: Pull over shirt    Upper body assist Assist Level: Supervision/Verbal cueing    Lower Body Dressing/Undressing Lower body dressing      What is the patient wearing?: Underwear/pull up     Lower body assist Assist for lower body dressing: Minimal Assistance - Patient > 75%     Toileting Toileting    Toileting assist Assist for toileting: Minimal Assistance - Patient > 75%     Transfers Chair/bed transfer  Transfers assist     Chair/bed transfer assist level: Contact Guard/Touching assist     Locomotion Ambulation   Ambulation assist   Ambulation activity did not occur: Safety/medical concerns  Assist level: Minimal Assistance - Patient > 75% Assistive device: Walker-rolling Max distance: 50   Walk 10 feet activity   Assist  Walk 10 feet activity did not occur: Safety/medical concerns  Assist level: Minimal Assistance - Patient > 75% Assistive device: Walker-rolling   Walk 50 feet activity   Assist Walk 50 feet with 2 turns activity did not occur: Safety/medical concerns         Walk 150 feet activity   Assist Walk 150 feet activity did not occur: Safety/medical concerns         Walk 10 feet on uneven surface  activity   Assist Walk 10 feet on uneven surfaces activity did not occur: Safety/medical concerns  Wheelchair     Assist Will patient use wheelchair at discharge?: Yes Type of Wheelchair: Manual    Wheelchair assist level: Supervision/Verbal cueing Max wheelchair distance: 150'    Wheelchair 50 feet with 2 turns activity    Assist        Assist Level: Supervision/Verbal cueing   Wheelchair 150 feet activity     Assist     Assist Level: Supervision/Verbal cueing    Medical Problem List and Plan: 1.Decreased functional mobilitysecondary to 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 Antithrombotics: -DVT/anticoagulation:SCD left lower extremity -antiplatelet therapy: aspirin 81 mg daily, Plavix 75 mg daily 3. Pain Management:Neurontin 300 mg 3 times a day  -OxyCR 10mg  qhs helpful for sleep, Oxy IR 5-10mg  q6prn  Will begin to wean soon Add long acting oxy CR at noc 4. Mood:Provide emotional support- rec Neuropsych for adjustment -antipsychotic agents: N/A 5. Neuropsych: This patientiscapable of making decisions on hisown behalf. 6. Skin/Wound Care:Routine skin checks  Plan to DC wound VAC 4/1 7. Fluids/Electrolytes/Nutrition:Routine ins and outs 8. Diabetes mellitus. Hemoglobin A1c 7.5.  Lantus insulin 30 units daily at bedtime  Tradjenta 5 mg daily. CBG (last 3)  Recent Labs    09/21/18 1638 09/21/18 2107 09/22/18 0627  GLUCAP 142* 132* 147*   Elevated and?  Trending up on 3/30 9. CAD with stenting. Continue aspirin and Plavix. No chest pain or shortness of breath 10. History of seizures. Keppra 500 mg daily. 11.CKD stage III. Creatinine baseline 1.35-1.80.   Creatinine 1.48 on 3/30  Continue to monitor 12. Hyperlipidemia.Zetia10 mg daily, Lipitor 20 mg daily 13. History of tobacco abuse. Provide counseling 14. Constipation. Laxative assistance 15.  Hypoalbuminemia  Supplement initiated on 3/30 16.  Acute blood loss anemia  Hemoglobin 10.0 on 3/30  Continue to monitor  LOS: 3 days A FACE TO FACE EVALUATION WAS PERFORMED   Curtis Bradford 09/22/2018, 10:23 AM

## 2018-09-22 NOTE — Progress Notes (Signed)
Physical Therapy Session Note  Patient Details  Name: Curtis Bradford MRN: [id number] Date of Birth: Nov 11, 1960  Today's Date: 09/22/2018 PT Individual Time: 1431-1535 PT Individual Time Calculation (min): 64 min   Short Term Goals: Week 1:  PT Short Term Goal 1 (Week 1): Pt will perform bed<>chair transfers w/ supervision PT Short Term Goal 2 (Week 1): Pt will ambulate 22' in household environment w/ min assist PT Short Term Goal 3 (Week 1): Pt will maintain dynamic standing balance w/ supervision  Skilled Therapeutic Interventions/Progress Updates:   Pt received supine in bed and agreeable to therapy session. Pt educated on importance of maintaining L LE skin integrity. Pt performed squat pivot transfers bed<>w/c<>mat table with supervision throughout session and cuing for proper set-up of transfer. Pt performed ~159ft of w/c mobility using B UEs for propulsion and intermittent use of L LE for directional changes all with supervision. Performed sit<>stand from mat to RW with heavy reliance on B UE support via RW and min assist for balance. Pt performed bed mobility tasks of supine<>sit and rolling R/L and prone<>supine all with supervision.   Performed the following therapeutic exercises with 2-3 sets to fatigue (reps ranging from 10-16) - Supine R LE straight leg raise with cuing on maintaining knee extension - Sidelying R LE hip abduction with manual facilitation for proper technique progressed to manually resisted movement - Prone B LE hip extension with knee flexion focusing on gluteal activation with manual facilitation for proper technique  - Supine B LE bent to straight leg lowers focusing on lower abdominal muscular strengthening with tactile and verbal cuing for maintaining neutral spine alignment and sustained abdominal muscular contracture - Seated reclined russian twist with 6lb weighted ball with cuing for proper technique   Pt transported back to room in w/c for time management  and performed squat pivot transfer to bed. Reinforcement of prior education on lying flat and performing supine R LE hip flexor stretch outside of therapy session. Pt left supine in bed, needs in reach, lines intact, and wound vac plugged in.   Therapy Documentation Precautions:  Precautions Precautions: Fall Precaution Comments: precautions/positioning reviewed with pt Restrictions Weight Bearing Restrictions: Yes RLE Weight Bearing: Non weight bearing  Pain: Reported minimal to no pain in R LE throughout session stating recent pain medication administration.   Therapy/Group: Individual Therapy  Ginny Forth, PT, DPT 09/22/2018, 3:44 PM

## 2018-09-23 ENCOUNTER — Inpatient Hospital Stay (HOSPITAL_COMMUNITY): Payer: Managed Care, Other (non HMO)

## 2018-09-23 ENCOUNTER — Inpatient Hospital Stay (HOSPITAL_COMMUNITY): Payer: Managed Care, Other (non HMO) | Admitting: Physical Therapy

## 2018-09-23 LAB — GLUCOSE, CAPILLARY
GLUCOSE-CAPILLARY: 144 mg/dL — AB (ref 70–99)
Glucose-Capillary: 83 mg/dL (ref 70–99)
Glucose-Capillary: 86 mg/dL (ref 70–99)
Glucose-Capillary: 140 mg/dL — ABNORMAL HIGH (ref 70–99)

## 2018-09-23 NOTE — Progress Notes (Signed)
Occupational Therapy Session Note  Patient Details  Name: Curtis Bradford MRN: [id number] Date of Birth: 30-Dec-1960  Today's Date: 09/23/2018 OT Individual Time: 0932-6712 OT Individual Time Calculation (min): 75 min    Short Term Goals: Week 1:  OT Short Term Goal 1 (Week 1): Pt will complete bathing sit to stand with supervision. OT Short Term Goal 2 (Week 1): Pt will complete LB dressing sit to stand with supervision. OT Short Term Goal 3 (Week 1): Pt will complete toilet transfer supervision using the RW. OT Short Term Goal 4 (Week 1): Pt complete tub shower transfer with supervision using the RW and tub bench.    Skilled Therapeutic Interventions/Progress Updates:    OT intervention with focus on BADL retraining, functional transfers, lateral leans in w/c, activity tolerance, w/c mobility, and safety awareness to increase independence with BADLs.  Pt completed bathing/dressing tasks at w/c level at sink.  Pt utilized lateral leans to doff/donn LB clothing and perform hygiene.  Pt completed all bathing/dressing tasks at supervisoin level. Pt educated on safety protocol in Rehab and purpose of belt alarm.  Pt verbalized understanding but continues to insist he doesn't need it.  Discussed home arrangement and discharge planning. Pt will need tub bench.  Pt requested to return to bed at end of session.  Pt remained in bed with all needs within reach and bed alarm activated.   Therapy Documentation Precautions:  Precautions Precautions: Fall Precaution Comments: precautions/positioning reviewed with pt Restrictions Weight Bearing Restrictions: Yes RLE Weight Bearing: Non weight bearing   Pain: Pain Assessment Pain Scale: 0-10 Pain Score: 6  Pain Type: Acute pain;Surgical pain Pain Location: Leg Pain Orientation: Right Pain Descriptors / Indicators: Sore;Aching Pain Frequency: Intermittent Pain Onset: On-going Patients Stated Pain Goal: 3 Pain Intervention(s): RN notified and meds  admin   Therapy/Group: Individual Therapy  Rich Brave 09/23/2018, 11:10 AM

## 2018-09-23 NOTE — Progress Notes (Signed)
Physical Therapy Session Note  Patient Details  Name: Curtis Bradford MRN: [id number] Date of Birth: 08/13/1960  Today's Date: 09/23/2018 PT Individual Time: 1884-1660 PT Individual Time Calculation (min): 54 min   Short Term Goals: Week 1:  PT Short Term Goal 1 (Week 1): Pt will perform bed<>chair transfers w/ supervision PT Short Term Goal 2 (Week 1): Pt will ambulate 51' in household environment w/ min assist PT Short Term Goal 3 (Week 1): Pt will maintain dynamic standing balance w/ supervision  Skilled Therapeutic Interventions/Progress Updates: Pt presented sitting EOB requesting PTA to return "in a bit" to complete phone call. Pt completed call 10 min later with PTA standing outside room. Pt agreeable to therapy. Pt performed squat pivot transfer to w/c CGA. Session focused on w/c mobility throughout hallways and UE strengthening for improved tolerance with RW. Pt propelled in hallways with BUE only to [employer]. Pt required intermittent breaks due to fatigue and intermittent cues for increased use of L/R UE due to occasional drift in either direction. Pt also propelled back to unit and proceeded to dayroom. Pt participated in UBE x 8 min (4 forward/4 backwards) on L8 for continued endurance. Propelled to rehab gym and participated in UE strengthening including bicep curls with 7lb x 20, shoulder flexion 6lb x 20, and chair push ups x 15. Pt propelled to room and returned to EOB in same manner as prior. Pt left at EOB with bed alarm on, call bell within reach and needs met.      Therapy Documentation Precautions:  Precautions Precautions: Fall Precaution Comments: precautions/positioning reviewed with pt Restrictions Weight Bearing Restrictions: Yes RLE Weight Bearing: Non weight bearing General:   Vital Signs: Therapy Vitals Temp: 98.1 F (36.7 C) Temp Source: Oral Pulse Rate: 97 Resp: 14 BP: (!) 122/103 Patient Position (if appropriate): Sitting Oxygen  Therapy SpO2: 100 % O2 Device: Room Air    Therapy/Group: Individual Therapy  Laiylah Roettger  Jonie Burdell, PTA  09/23/2018, 4:11 PM

## 2018-09-23 NOTE — Progress Notes (Signed)
Physical Therapy Session Note  Patient Details  Name: Curtis Bradford MRN: [id number] Date of Birth: January 03, 1961  Today's Date: 09/23/2018 PT Individual Time: 0800-0902 PT Individual Time Calculation (min): 62 min   Short Term Goals: Week 1:  PT Short Term Goal 1 (Week 1): Pt will perform bed<>chair transfers w/ supervision PT Short Term Goal 2 (Week 1): Pt will ambulate 69' in household environment w/ min assist PT Short Term Goal 3 (Week 1): Pt will maintain dynamic standing balance w/ supervision  Skilled Therapeutic Interventions/Progress Updates:     Patient sitting EOB to receive morning meds from RN upon PT arrival. Patient alert and agreeable to PT session.  Therapeutic Activity: Bed Mobility: Patient performed supine to/from sit with supervision for safety with cues for protecting residual limb. Patient applied limb guard with max A from therapist for placement and adjusting to fit. [name] missing upper strap of limb guard causing it to slide down in standing. Transfers: Patient performed sit to/from stand CGA for safety and balance using a RW x6. Provided verbal cues for controlled standing and sitting, and reaching back before sitting.  Gait Training:  Patient ambulated 50 feet x2 using RW with CGA for safety. Ambulated with hop-to gait pattern on R with diabetic shoe. Provided verbal cues for slow and controlled hops using UE for assistance to reduce impact at initial contact on R foot, and for landing between his hands to avoided getting too close to the RW when stepping.  Therapeutic Exercise: Patient performed the following exercises with verbal and tactile cues for proper technique. -L SLR x15 for 2 sets in supine -L standing hip abduction x15 for 2 sets -L standing hip extension x15 for 2 sets -L LAQ holding in extension for 5 seconds x15 for 2 sets  Patient in w/c at end of session with breaks locked, seat belt alarm set, and all needs within reach with social worker  in room. During session patient was educated on desensitization techniques, phantom pain and neuroplasticity, skin assessment for his residual limb and well limb daily, and energy conservation techniques.   Therapy Documentation Precautions:  Precautions Precautions: Fall Precaution Comments: precautions/positioning reviewed with pt Restrictions Weight Bearing Restrictions: Yes RLE Weight Bearing: Non weight bearing Pain: Pain Assessment Pain Scale: 0-10 Pain Score: 6  Pain Type: Acute pain;Surgical pain Pain Location: Leg Pain Orientation: Right Pain Descriptors / Indicators: Sore;Aching Pain Frequency: Intermittent Pain Onset: On-going Patients Stated Pain Goal: 3 Pain Intervention(s): Medication (See eMAR)   Therapy/Group: Individual Therapy  Jamen Loiseau L Octivia Canion, PT, DPT 09/23/2018, 11:07 AM

## 2018-09-23 NOTE — Progress Notes (Signed)
[facility] PHYSICAL MEDICINE & REHABILITATION PROGRESS NOTE   Subjective/Complaints: Patient seen laying in bed this morning.  He states he slept well overnight.  States bowel movements have improved.  He has questions regarding staples and dressing changes after VAD is DCed.  ROS: Denies CP SOB, N/V/D  Objective:   No results found. Recent Labs    09/22/18 0550  WBC 9.9  HGB 10.0*  HCT 31.5*  PLT 375   Recent Labs    09/22/18 0550  NA 136  K 4.1  CL 103  CO2 25  GLUCOSE 140*  BUN 17  CREATININE 1.48*  CALCIUM 8.7*    Intake/Output Summary (Last 24 hours) at 09/23/2018 0835 Last data filed at 09/23/2018 0550 Gross per 24 hour  Intake 960 ml  Output 1000 ml  Net -40 ml     Physical Exam: Vital Signs Blood pressure 135/86, pulse 85, temperature 98.2 F (36.8 C), temperature source Oral, resp. rate 18, SpO2 99 %. Constitutional: No distress . Vital signs reviewed. HENT: Normocephalic.  Atraumatic. Eyes: EOMI. No discharge. Cardiovascular: RRR.  No JVD. Respiratory: CTA bilaterally.  Normal effort. GI: BS +. Non-distended. Musc: Right BKA. Neurologic: Alert and oriented Motor: 5/5 bilateral upper extremities and left lower extremity, stable 5/5 right hip flexion, stable Skin: Right BKA with VAC  Assessment/Plan: 1. Functional deficits secondary to RIght BKA which require 3+ hours per day of interdisciplinary therapy in a comprehensive inpatient rehab setting.  Physiatrist is providing close team supervision and 24 hour management of active medical problems listed below.  Physiatrist and rehab team continue to assess barriers to discharge/monitor patient progress toward functional and medical goals  Care Tool:  Bathing    Body parts bathed by patient: Right arm, Left arm, Chest, Face, Abdomen(UB only this am)     Body parts n/a: Right lower leg   Bathing assist Assist Level: Independent(w/c level)     Upper Body Dressing/Undressing Upper body  dressing   What is the patient wearing?: Pull over shirt    Upper body assist Assist Level: Independent    Lower Body Dressing/Undressing Lower body dressing      What is the patient wearing?: Underwear/pull up     Lower body assist Assist for lower body dressing: Minimal Assistance - Patient > 75%     Toileting Toileting    Toileting assist Assist for toileting: Contact Guard/Touching assist     Transfers Chair/bed transfer  Transfers assist     Chair/bed transfer assist level: Supervision/Verbal cueing(squat pivot)     Locomotion Ambulation   Ambulation assist   Ambulation activity did not occur: Safety/medical concerns  Assist level: Minimal Assistance - Patient > 75% Assistive device: Walker-rolling Max distance: 50   Walk 10 feet activity   Assist  Walk 10 feet activity did not occur: Safety/medical concerns  Assist level: Minimal Assistance - Patient > 75% Assistive device: Walker-rolling   Walk 50 feet activity   Assist Walk 50 feet with 2 turns activity did not occur: Safety/medical concerns         Walk 150 feet activity   Assist Walk 150 feet activity did not occur: Safety/medical concerns         Walk 10 feet on uneven surface  activity   Assist Walk 10 feet on uneven surfaces activity did not occur: Safety/medical concerns         Wheelchair     Assist Will patient use wheelchair at discharge?: Yes Type of Wheelchair: Manual  Wheelchair assist level: Supervision/Verbal cueing Max wheelchair distance: 150'    Wheelchair 50 feet with 2 turns activity    Assist        Assist Level: Supervision/Verbal cueing   Wheelchair 150 feet activity     Assist     Assist Level: Supervision/Verbal cueing    Medical Problem List and Plan: 1.Decreased functional mobilitysecondary to right BKA 09/17/2018 with wound VAC after 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  Antithrombotics: -DVT/anticoagulation:SCD left lower extremity -antiplatelet therapy: aspirin 81 mg daily, Plavix 75 mg daily 3. Pain Management:Neurontin 300 mg 3 times a day  OxyCR 10mg  qhs helpful for sleep DC'd on 3/31  Oxy IR 5-10mg  q6prn  Will begin to wean soon 4. Mood:Provide emotional support- rec Neuropsych for adjustment -antipsychotic agents: N/A 5. Neuropsych: This patientiscapable of making decisions on hisown behalf. 6. Skin/Wound Care:Routine skin checks  Plan to DC wound VAC 4/1 7. Fluids/Electrolytes/Nutrition:Routine ins and outs 8. Diabetes mellitus. Hemoglobin A1c 7.5.  Lantus insulin 30 units daily at bedtime  Tradjenta 5 mg daily. CBG (last 3)  Recent Labs    09/22/18 1705 09/22/18 2106 09/23/18 0638  GLUCAP 119* 119* 144*   Slightly labile, but overall controlled on 3/31 9. CAD with stenting. Continue aspirin and Plavix. No chest pain or shortness of breath 10. History of seizures. Keppra 500 mg daily. 11.CKD stage III. Creatinine baseline 1.35-1.80.   Creatinine 1.48 on 3/30  Continue to monitor 12. Hyperlipidemia.Zetia10 mg daily, Lipitor 20 mg daily 13. History of tobacco abuse. Provide counseling 14. Constipation. Laxative assistance 15.  Hypoalbuminemia  Supplement initiated on 3/30 16.  Acute blood loss anemia  Hemoglobin 10.0 on 3/30  Continue to monitor  LOS: 4 days A FACE TO FACE EVALUATION WAS PERFORMED   Karis Juba 09/23/2018, 8:35 AM

## 2018-09-24 ENCOUNTER — Inpatient Hospital Stay (HOSPITAL_COMMUNITY): Payer: Managed Care, Other (non HMO)

## 2018-09-24 ENCOUNTER — Inpatient Hospital Stay (INDEPENDENT_AMBULATORY_CARE_PROVIDER_SITE_OTHER): Payer: Managed Care, Other (non HMO) | Admitting: Orthopedic Surgery

## 2018-09-24 ENCOUNTER — Encounter (HOSPITAL_COMMUNITY): Payer: Managed Care, Other (non HMO) | Admitting: Psychology

## 2018-09-24 LAB — GLUCOSE, CAPILLARY
Glucose-Capillary: 110 mg/dL — ABNORMAL HIGH (ref 70–99)
Glucose-Capillary: 89 mg/dL (ref 70–99)
Glucose-Capillary: 198 mg/dL — ABNORMAL HIGH (ref 70–99)
Glucose-Capillary: 74 mg/dL (ref 70–99)

## 2018-09-24 NOTE — Progress Notes (Signed)
Occupational Therapy Session Note  Patient Details  Name: Curtis Bradford MRN: [id number] Date of Birth: 20-May-1961  Today's Date: 09/24/2018 OT Individual Time: 1030-1130 OT Individual Time Calculation (min): 60 min    Short Term Goals: Week 1:  OT Short Term Goal 1 (Week 1): Pt will complete bathing sit to stand with supervision. OT Short Term Goal 2 (Week 1): Pt will complete LB dressing sit to stand with supervision. OT Short Term Goal 3 (Week 1): Pt will complete toilet transfer supervision using the RW. OT Short Term Goal 4 (Week 1): Pt complete tub shower transfer with supervision using the RW and tub bench.    Skilled Therapeutic Interventions/Progress Updates:    Pt resting in bed upon arrival and agreeable to therapy.  Pt stated MD informed him that wound vac would be removed at end of day.  Pt agreeable to taking a shower tomorrow. OT intervention with focus on squat pivot tranfsers to toilet, tub bench, and simulated bed transfer at height of bed at home. Pt performed all transfers with supervision and assistance with w/c parts and min verbal cues for safety/sequencing. Pt also engaged in w/c mobility in kitchen and discussed home setup.  Pt will have to stand to access microwave.  Pt stated he is feeling better about discharge but still anxious on how he is going to make it all work.  Assured pt that therapists would continue to address his concerns and help him problem solve possible barriers. Pt returned to room and transferred to bed with supervisoin . Pt remained in bed with all needs within reach and bed alarm activated.   Therapy Documentation Precautions:  Precautions Precautions: Fall Precaution Comments: precautions/positioning reviewed with pt Restrictions Weight Bearing Restrictions: Yes RLE Weight Bearing: Non weight bearing  Pain:  Pt c/o RLE pain 5/10; repositioned and emotional support provided   Therapy/Group: Individual Therapy  Rich Brave 09/24/2018, 11:33 AM

## 2018-09-24 NOTE — Plan of Care (Signed)
Due to the current state of emergency, patients may not be receiving their 3-hours of Medicare-mandated therapy.   

## 2018-09-24 NOTE — Progress Notes (Addendum)
[place] PHYSICAL MEDICINE & REHABILITATION PROGRESS NOTE   Subjective/Complaints: Patient seen laying in the bed this morning.  He states he slept well overnight.  He has questions about discontinuing VAC.  ROS: Denies CP SOB, N/V/D  Objective:   No results found. Recent Labs    09/22/18 0550  WBC 9.9  HGB 10.0*  HCT 31.5*  PLT 375   Recent Labs    09/22/18 0550  NA 136  K 4.1  CL 103  CO2 25  GLUCOSE 140*  BUN 17  CREATININE 1.48*  CALCIUM 8.7*    Intake/Output Summary (Last 24 hours) at 09/24/2018 0858 Last data filed at 09/24/2018 0600 Gross per 24 hour  Intake 480 ml  Output 1550 ml  Net -1070 ml     Physical Exam: Vital Signs Blood pressure (!) 143/78, pulse 72, temperature 97.7 F (36.5 C), temperature source Oral, resp. rate 18, SpO2 100 %. Constitutional: No distress . Vital signs reviewed. HENT: Normocephalic.  Atraumatic. Eyes: EOMI. No discharge. Cardiovascular: RRR. No JVD. Respiratory: CTA bilaterally. Normal effort. GI: BS +. Non-distended. Musc: Right BKA. Neurologic: Alert and oriented Motor: 5/5 bilateral upper extremities and left lower extremity, unchanged 5/5 right hip flexion, unchanged Skin: Right BKA with VAC  Assessment/Plan: 1. Functional deficits secondary to RIght BKA which require 3+ hours per day of interdisciplinary therapy in a comprehensive inpatient rehab setting.  Physiatrist is providing close team supervision and 24 hour management of active medical problems listed below.  Physiatrist and rehab team continue to assess barriers to discharge/monitor patient progress toward functional and medical goals  Care Tool:  Bathing    Body parts bathed by patient: Right arm, Left arm, Chest, Face, Abdomen, Front perineal area, Buttocks, Right upper leg, Left upper leg, Left lower leg     Body parts n/a: Right lower leg   Bathing assist Assist Level: Independent with assistive device     Upper Body  Dressing/Undressing Upper body dressing   What is the patient wearing?: Pull over shirt    Upper body assist Assist Level: Independent    Lower Body Dressing/Undressing Lower body dressing      What is the patient wearing?: Underwear/pull up, Pants     Lower body assist Assist for lower body dressing: Supervision/Verbal cueing     Toileting Toileting    Toileting assist Assist for toileting: Contact Guard/Touching assist     Transfers Chair/bed transfer  Transfers assist     Chair/bed transfer assist level: Contact Guard/Touching assist     Locomotion Ambulation   Ambulation assist   Ambulation activity did not occur: Safety/medical concerns  Assist level: Contact Guard/Touching assist Assistive device: Walker-rolling Max distance: 50'   Walk 10 feet activity   Assist  Walk 10 feet activity did not occur: Safety/medical concerns  Assist level: Contact Guard/Touching assist Assistive device: Walker-rolling   Walk 50 feet activity   Assist Walk 50 feet with 2 turns activity did not occur: Safety/medical concerns  Assist level: Contact Guard/Touching assist Assistive device: Walker-rolling    Walk 150 feet activity   Assist Walk 150 feet activity did not occur: Safety/medical concerns         Walk 10 feet on uneven surface  activity   Assist Walk 10 feet on uneven surfaces activity did not occur: Safety/medical concerns         Wheelchair     Assist Will patient use wheelchair at discharge?: Yes Type of Wheelchair: Manual    Wheelchair assist level:  Supervision/Verbal cueing Max wheelchair distance: 150'    Wheelchair 50 feet with 2 turns activity    Assist        Assist Level: Supervision/Verbal cueing   Wheelchair 150 feet activity     Assist     Assist Level: Supervision/Verbal cueing    Medical Problem List and Plan: 1.Decreased functional mobilitysecondary to right BKA 09/17/2018 with wound VAC  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  2. Antithrombotics: -DVT/anticoagulation:SCD left lower extremity -antiplatelet therapy: aspirin 81 mg daily, Plavix 75 mg daily 3. Pain Management:Neurontin 300 mg 3 times a day  OxyCR 10mg  qhs helpful for sleep DC'd on 3/31  Oxy IR 5-10mg  q6prn 4. Mood:Provide emotional support- rec Neuropsych for adjustment -antipsychotic agents: N/A 5. Neuropsych: This patientiscapable of making decisions on hisown behalf. 6. Skin/Wound Care:Routine skin checks  DC VAC today 7. Fluids/Electrolytes/Nutrition:Routine ins and outs 8. Diabetes mellitus. Hemoglobin A1c 7.5.  Lantus insulin 30 units daily at bedtime  Tradjenta 5 mg daily. CBG (last 3)  Recent Labs    09/23/18 1636 09/23/18 2102 09/24/18 0546  GLUCAP 86 140* 110*   Relatively controlled on 4/1 9. CAD with stenting. Continue aspirin and Plavix. No chest pain or shortness of breath 10. History of seizures. Keppra 500 mg daily. 11.CKD stage III. Creatinine baseline 1.35-1.80.   Creatinine 1.48 on 3/30  Continue to monitor 12. Hyperlipidemia.Zetia10 mg daily, Lipitor 20 mg daily 13. History of tobacco abuse. Provide counseling 14. Constipation. Laxative assistance 15.  Hypoalbuminemia  Supplement initiated on 3/30 16.  Acute blood loss anemia  Hemoglobin 10.0 on 3/30  Continue to monitor  LOS: 5 days A FACE TO FACE EVALUATION WAS PERFORMED  Curtis Bradford Curtis Bradford 09/24/2018, 8:58 AM

## 2018-09-24 NOTE — Consult Note (Signed)
Neuropsychological Consultation   Patient:   Curtis Bradford   DOB:   March 19, 1961  MR Number:  [id number]  Location:  [facility] [address] CENTER [address] [id number] [place] [address] Dept: [phone] Loc: [phone]           Date of Service:   09/24/2018  Start Time:   9 AM End Time:   10 AM  Provider/Observer:  Arley Phenix, Psy.D.       Clinical Neuropsychologist       Billing Code/Service: [phone]  Chief Complaint:    Curtis Bradford is a 58 year old right-handed male with a history of CAD with stenting maintained on aspirin and Plavix, chronic kidney disease stage III, diabetes, hypertension, CVA 2012, tobacco abuse, seizure disorder maintained on Keppra.  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  The patient presented on 09/17/2018 with progressive dehiscence ischemic changes of recent right TMA amputation.  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 Lajoyce Corners.  The patient does have a history of anxiety associated with his medical issues.  Reason for Service:  MHD:QQIW Curtis Bradford a 58 year old right-handed male with history of CAD with stenting maintained on aspirin and Plavix,CKD stage III, diabetes mellitus, hypertension,CVA 2012, tobacco abuse, seizure disorder maintained on Keppra. 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 Per chart review patient lives alone. One level apartment. Friends check on him routinely. Presented 09/17/2018 with progressive dehiscence ischemic changes of recent right TMA amputation. 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 Lajoyce Corners with application of wound VAC. Hospital course pain management. Patient remains on aspirin and Plavix as prior to admission. Therapy evaluations completed with recommendations of physical medicine rehabilitation  consult. Patient was admitted for a comprehensive rehabilitation program.  Current Status:  The patient has had times of significant anxiety and worry about how his amputation and recovery time will affect his job status versus short-term disability.  The patient reports that he has experienced increased worry but realizes that he is likely looking at less medical interventions once he heals up from his amputation.   Behavioral Observation: Curtis Bradford  presents as a 58 y.o.-year-old Right Caucasian Male who appeared his stated age. his dress was Appropriate and he was Well Groomed and his manners were Appropriate to the situation.  his participation was indicative of Appropriate and Attentive behaviors.  There were any physical disabilities noted.  he displayed an appropriate level of cooperation and motivation.     Interactions:    Active Appropriate and Attentive  Attention:   within normal limits and attention span and concentration were age appropriate  Memory:   within normal limits; recent and remote memory intact  Visuo-spatial:  not examined  Speech (Volume):  normal  Speech:   normal; normal  Thought Process:  Coherent and Relevant  Though Content:  WNL; not suicidal and not homicidal  Orientation:   person, place, time/date and situation  Judgment:   Good  Planning:   Good  Affect:    Anxious  Mood:    Anxious  Insight:   Good  Intelligence:   normal  Medical History:   Past Medical History:  Diagnosis Date  . CAD (coronary artery disease)   . CKD (chronic kidney disease), stage III (HCC)   . 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 History  of DVT (deep vein thrombosis)   . History of kidney stones    passed stones - no surgery  . HLD (hyperlipidemia)   . Myocardial infarction (HCC) 2009  . Peripheral vascular disease (HCC)    right leg  . Seizure (HCC)    last one 2015  . Stroke [facility]) 2012, 10/2017   2012-speech was effected- has  come back.10/2017- numbness of right side no taste right  side. 2012-  . Tobacco abuse     Psychiatric History:  The patient denies any significant prior psychiatric history.  He does have a history of seizure post CVA.  Family Med/Psych History:  Family History  Problem Relation Age of Onset  . Diabetes Mellitus II Mother   . Diabetes Mellitus II Father     Risk of Suicide/Violence: low the patient denies any suicidal homicidal ideation.  Impression/DX:  Curtis Bradford is a 58 year old right-handed male with a history of CAD with stenting maintained on aspirin and Plavix, chronic kidney disease stage III, diabetes, hypertension, CVA 2012, tobacco abuse, seizure disorder maintained on Keppra.  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  The patient presented on 09/17/2018 with progressive dehiscence ischemic changes of recent right TMA amputation.  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 Lajoyce Corners.  The patient does have a history of anxiety associated with his medical issues.  The patient has had times of significant anxiety and worry about how his amputation and recovery time will affect his job status versus short-term disability.  The patient reports that he has experienced increased worry but realizes that he is likely looking at less medical interventions once he heals up from his amputation.  Disposition/Plan:  Will follow up with the patient next week if needed.  The patient does acknowledge a significant amount of worry and anxiety but reports it is not limiting his physical therapies and rehabilitation efforts.  Diagnosis:    BKA, Adjustment issues.        Electronically Signed   _______________________ Arley Phenix, Psy.D.

## 2018-09-24 NOTE — Progress Notes (Signed)
Social Work Patient ID: Curtis Bradford, male   DOB: Jan 09, 1961, 58 y.o.   MRN: [id number] Met with pt to discuss team conference goals mod/i wheelchair level and target discharge 4/8. He is in a place he is not used to being in and feels somewhat out of control and helpless. He has spoken with neuro-psych and feels it has helped and will problem solve with therapists getting around in his home. He has limited supports and this doesn't help the way he feels either. He can see his progress which is encouraging to him and will work on discharge needs.

## 2018-09-24 NOTE — Patient Care Conference (Signed)
Inpatient RehabilitationTeam Conference and Plan of Care Update Date: 09/24/2018   Time: 10:20 AM    Patient Name: Curtis Bradford      Medical Record Number: [id number]  Date of Birth: 10-Jun-1961 Sex: Male         Room/Bed: 4M05C/4M05C-01 Payor Info: Payor: [employer] / Plan: Optician, dispensing / Product Type: *No Product type* /    Admitting Diagnosis: BKA  Admit Date/Time:  09/19/2018  3:47 PM Admission Comments: No comment available   Primary Diagnosis:  <principal problem not specified> Principal Problem: <principal problem not specified>  Patient Active Problem List   Diagnosis Date Noted  . Acute blood loss anemia   . Hypoalbuminemia due to protein-calorie malnutrition (HCC)   . Diabetes mellitus type 2 in nonobese (HCC)   . Postoperative pain   . Neuropathic pain   . Seizures (HCC)   . Right below-knee amputee (HCC) 09/19/2018  . Below knee amputation (HCC) 09/17/2018  . Gangrene of right foot (HCC)   . Status post transmetatarsal amputation of foot, right (HCC) 07/23/2018  . Osteomyelitis (HCC) 07/23/2018  . TIA (transient ischemic attack) 10/30/2017  . Medication monitoring encounter 03/28/2017  . Status post amputation of toe of right foot (HCC) 03/05/2017  . Subacute osteomyelitis, right ankle and foot (HCC)   . Peripheral neuropathy 02/16/2017  . Peripheral artery disease (HCC) 02/16/2017  . 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 Diabetic foot infection (HCC) 02/14/2017  . Type II diabetes mellitus with renal manifestations (HCC)   . HLD (hyperlipidemia)   . Tobacco abuse   . CKD stage 2 due to type 2 diabetes mellitus (HCC)   . CAD (coronary artery disease)   . Seizure disorder [facility])     Expected Discharge Date: Expected Discharge Date: 10/01/18  Team Members Present: Physician leading conference: Dr. 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  Improve CKD, ABLA, pain, wound, DM, mobility, therapies  See above   Bowel/Bladder        Cont B & B     Swallow/Nutrition/ Hydration             ADL's   supervision overall at w/c level for bathing/dressing; impulsive, decreased safety awareness  mod I overall  discharge planning, safety awareness, BADL retraining, education, activity tolerance, standing balance   Mobility   supervision basic transfers, CGA gait x 30' with RW, supervision w/c propulsion x 150'  Mod I transfers, w/c x 150', gait x 25'  pt ed, safety, transfers, gait training, w/c propulsion, HEP   Communication             Safety/Cognition/ Behavioral Observations            Pain        taking meds as ordered for pain issues     Skin        wound vac to be dc today-monitor and wrap leg        *See Care Plan and progress notes for long and short-term goals.     Barriers to Discharge  Current Status/Progress Possible Resolutions Date Resolved   Physician  Medical stability;Wound Care;Weight bearing restrictions;Decreased caregiver support;Home environment access/layout     See above  Therapies, follow labs, optimize DM meds      Nursing                  PT                    OT                  SLP                SW Decreased caregiver support Does not have 24 hr care at home            Discharge Planning/Teaching Needs:  HOme with intermittent assist from freinds, will need to be mod/i level before going home alone      Team Discussion:  Making progress in therapies, talks a lot and at times distracted by this. Safety awareness working on currently poor. Neuro-psych seeing  today. Weaning pain meds. Vac to DC today. Needs to be mod/i to go home safely due to intermittent assist only.  Revisions to Treatment Plan:  DC 4/8    Continued Need for Acute Rehabilitation Level of Care: The patient requires daily medical management by a physician with specialized training in physical medicine and rehabilitation for the following conditions: Daily direction of a multidisciplinary physical rehabilitation program to ensure safe treatment while eliciting the highest outcome that is of practical value to the patient.: Yes Daily medical management of patient stability for increased activity during participation in an intensive rehabilitation regime.: Yes Daily analysis of laboratory values and/or radiology reports with any subsequent need for medication adjustment of medical intervention for : Post surgical problems;Wound care problems;Diabetes problems   I attest that I was present, lead the team conference, and concur with the assessment and plan of the team.   Lucy Chris 09/25/2018, 9:28 AM

## 2018-09-24 NOTE — Progress Notes (Addendum)
Physical Therapy Session Note  Patient Details  Name: Curtis Bradford MRN: [id number] Date of Birth: 12-19-1960  Today's Date: 09/24/2018 PT Individual Time: 0800-0900 and 1330-1445 PT Individual Time Calculation (min): 60 min and 75 min  Short Term Goals: Week 1:  PT Short Term Goal 1 (Week 1): Pt will perform bed<>chair transfers w/ supervision PT Short Term Goal 2 (Week 1): Pt will ambulate 15' in household environment w/ min assist PT Short Term Goal 3 (Week 1): Pt will maintain dynamic standing balance w/ supervision  Skilled Therapeutic Interventions/Progress Updates:  tx 1:  Pt resting in bed.  Bed mobility with supervision. Pain R residual limb 7/10, premedicated.   Pt donned L shoe in sitting EOB.  Squat pivot to R with supervision, bed> w/c.  W/c propulsion on level tile with bil UEs with supervision, x 150'. Pt needs cues to lock.unlcok brakes safely, and min assist and mod cues to manage bil leg rests.  Therapeutic exercise performed with LLE to increase strength for functional mobility: KInetron from w/c level at resistance 50 cm/sec, x 20 reps x 2 targeting quadriceps, x 20 reps x 2 targeting gluteals.  Gait training with RW x 30' with CGA, min cues for step length.  Advanced gait training, side and backwards stepping,, x 5' each direction with CGA, RW.  W/c> bed with supervsion squat pivot.  Pt left sitting EOB with alarm set, needs at hand, using urinal.  tx 2:  Pt sitting EOB finishing lunch.  Pain 4/10 residual limb, premedicated.  Using hand out and mod cues, pt performed 15 x 1 each: Seated: R short arc quad knee extension, chair push ups (10 x 1)   Supine: L unilateral bridging with R hip flexion, bil glut sets, bil quad sets, bil adductor squeezes.   L side lying: R hip abduction Prone: R/L isolated hip extension with flexed knee  Gait training with RW on level tile x 40' with 1 turn, CGA.  Pt's L toes caught on the floor x1, without LOB.  W/c> bed squat  pivot with supervision. Bed mobility with supervision.  Educated pt on prone lying x 15 min BID to avoid contractures of hips.  Pt left resting in bed with alarm set and needs at hand.      Therapy Documentation Precautions:  Precautions Precautions: Fall Precaution Comments: precautions/positioning reviewed with pt Restrictions Weight Bearing Restrictions: Yes RLE Weight Bearing: Non weight bearing       Therapy/Group: Individual Therapy  Kamani Magnussen 09/24/2018, 10:46 AM

## 2018-09-25 ENCOUNTER — Inpatient Hospital Stay (HOSPITAL_COMMUNITY): Payer: Managed Care, Other (non HMO) | Admitting: Occupational Therapy

## 2018-09-25 ENCOUNTER — Inpatient Hospital Stay (HOSPITAL_COMMUNITY): Payer: Managed Care, Other (non HMO)

## 2018-09-25 DIAGNOSIS — R7309 Other abnormal glucose: Secondary | ICD-10-CM

## 2018-09-25 LAB — GLUCOSE, CAPILLARY
Glucose-Capillary: 163 mg/dL — ABNORMAL HIGH (ref 70–99)
Glucose-Capillary: 98 mg/dL (ref 70–99)
Glucose-Capillary: 150 mg/dL — ABNORMAL HIGH (ref 70–99)
Glucose-Capillary: 148 mg/dL — ABNORMAL HIGH (ref 70–99)

## 2018-09-25 NOTE — Progress Notes (Signed)
Occupational Therapy Session Note  Patient Details  Name: Curtis Bradford MRN: [id number] Date of Birth: 05-Mar-1961  Today's Date: 09/25/2018 OT Individual Time: 1230-1345 OT Individual Time Calculation (min): 75 min    Short Term Goals: Week 2:  OT Short Term Goal 1 (Week 2): STG=LTG 2/2 ELOS  Skilled Therapeutic Interventions/Progress Updates:    OT intervention with focus on BADL restraining, functional transfers, w/c mobility, bathing at shower level, dressing at w/c and bed level, activity tolerance, safety awareness and discharge planning to increase independence with BADLs.  Pt performed all squat pivot transfers with close supervision and min verbal cues for safety.  Pt completred bathing tasks seated on tub bench in shower using lateral leans to bathe buttocks.  Pt completed dressing tasks at w/c level and bed level with lateral leans to pull pants over hips.  Pt required min verbal cues for safety awareness througout session.  Pt requires more than a reasonabel amount of time to complete tasks.  Pt transferred to bed and remain seated EOB eating lunch with bed alarm activated.   Therapy Documentation Precautions:  Precautions Precautions: Fall Precaution Comments: precautions/positioning reviewed with pt Restrictions Weight Bearing Restrictions: Yes RLE Weight Bearing: Non weight bearing   Pain:  Pt c/o 4/10 pain in RLE; repositioned   Therapy/Group: Individual Therapy  Rich Brave 09/25/2018, 2:36 PM

## 2018-09-25 NOTE — Progress Notes (Signed)
[place] PHYSICAL MEDICINE & REHABILITATION PROGRESS NOTE   Subjective/Complaints: Patient seen laying in bed this morning.  He states he slept well overnight.  He notes that his shrinker is too big for him.  ROS: Denies CP SOB, N/V/D  Objective:   No results found. No results for input(s): WBC, HGB, HCT, PLT in the last 72 hours. No results for input(s): NA, K, CL, CO2, GLUCOSE, BUN, CREATININE, CALCIUM in the last 72 hours.  Intake/Output Summary (Last 24 hours) at 09/25/2018 1030 Last data filed at 09/25/2018 0900 Gross per 24 hour  Intake 720 ml  Output 1875 ml  Net -1155 ml     Physical Exam: Vital Signs Blood pressure 119/78, pulse 75, temperature 98 F (36.7 C), temperature source Oral, resp. rate 16, SpO2 99 %. Constitutional: No distress . Vital signs reviewed. HENT: Normocephalic.  Atraumatic. Eyes: EOMI. No discharge. Cardiovascular: RRR.  No JVD. Respiratory: CTA bilaterally.  Normal effort. GI: BS +. Non-distended. Musc: Right BKA, with tenderness along edges of incision. Neurologic: Alert and oriented Motor: 5/5 bilateral upper extremities and left lower extremity, unchanged 5/5 right hip flexion, unchanged Skin: Right BKA with incision C/D/I  Assessment/Plan: 1. Functional deficits secondary to RIght BKA which require 3+ hours per day of interdisciplinary therapy in a comprehensive inpatient rehab setting.  Physiatrist is providing close team supervision and 24 hour management of active medical problems listed below.  Physiatrist and rehab team continue to assess barriers to discharge/monitor patient progress toward functional and medical goals  Care Tool:  Bathing    Body parts bathed by patient: Right arm, Left arm, Chest, Face, Abdomen, Front perineal area, Buttocks, Right upper leg, Left upper leg, Left lower leg     Body parts n/a: Right lower leg   Bathing assist Assist Level: Independent with assistive device     Upper Body  Dressing/Undressing Upper body dressing   What is the patient wearing?: Pull over shirt    Upper body assist Assist Level: Independent    Lower Body Dressing/Undressing Lower body dressing      What is the patient wearing?: Underwear/pull up, Pants     Lower body assist Assist for lower body dressing: Supervision/Verbal cueing     Toileting Toileting    Toileting assist Assist for toileting: Contact Guard/Touching assist     Transfers Chair/bed transfer  Transfers assist     Chair/bed transfer assist level: Supervision/Verbal cueing     Locomotion Ambulation   Ambulation assist   Ambulation activity did not occur: Safety/medical concerns  Assist level: Contact Guard/Touching assist Assistive device: Walker-rolling Max distance: 30   Walk 10 feet activity   Assist  Walk 10 feet activity did not occur: Safety/medical concerns  Assist level: Contact Guard/Touching assist Assistive device: Walker-rolling   Walk 50 feet activity   Assist Walk 50 feet with 2 turns activity did not occur: Safety/medical concerns  Assist level: Contact Guard/Touching assist Assistive device: Walker-rolling    Walk 150 feet activity   Assist Walk 150 feet activity did not occur: Safety/medical concerns         Walk 10 feet on uneven surface  activity   Assist Walk 10 feet on uneven surfaces activity did not occur: Safety/medical concerns         Wheelchair     Assist Will patient use wheelchair at discharge?: Yes Type of Wheelchair: Manual    Wheelchair assist level: Independent Max wheelchair distance: 200    Wheelchair 50 feet with 2 turns  activity    Assist        Assist Level: Independent   Wheelchair 150 feet activity     Assist     Assist Level: Independent    Medical Problem List and Plan: 1.Decreased functional mobilitysecondary to 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 Antithrombotics: -DVT/anticoagulation:SCD left lower extremity -antiplatelet therapy: aspirin 81 mg daily, Plavix 75 mg daily 3. Pain Management:Neurontin 300 mg 3 times a day  OxyCR 10mg  qhs helpful for sleep DC'd on 3/31  Oxy IR 5-10mg  q6prn  Relatively controlled on 4/2 4. Mood:Provide emotional support- rec Neuropsych for adjustment -antipsychotic agents: N/A 5. Neuropsych: This patientiscapable of making decisions on hisown behalf. 6. Skin/Wound Care:Routine skin checks  DC VAC on 4/1 7. Fluids/Electrolytes/Nutrition:Routine ins and outs 8. Diabetes mellitus. Hemoglobin A1c 7.5.  Lantus insulin 30 units daily at bedtime  Tradjenta 5 mg daily. CBG (last 3)  Recent Labs    09/24/18 1732 09/24/18 2059 09/25/18 0654  GLUCAP 198* 74 163*   Labile on 4/2 9. CAD with stenting. Continue aspirin and Plavix. No chest pain or shortness of breath 10. History of seizures. Keppra 500 mg daily. 11.CKD stage III. Creatinine baseline 1.35-1.80.   Creatinine 1.48 on 3/30  Continue to monitor 12. Hyperlipidemia.Zetia10 mg daily, Lipitor 20 mg daily 13. History of tobacco abuse. Provide counseling 14. Constipation. Laxative assistance 15.  Hypoalbuminemia  Supplement initiated on 3/30 16.  Acute blood loss anemia  Hemoglobin 10.0 on 3/30  Labs ordered for tomorrow  Continue to monitor  LOS: 6 days A FACE TO FACE EVALUATION WAS PERFORMED  Ankit Karis Juba 09/25/2018, 10:30 AM

## 2018-09-25 NOTE — Progress Notes (Signed)
Physical Therapy Session Note  Patient Details  Name: Curtis Bradford MRN: [id number] Date of Birth: 03-31-1961  Today's Date: 09/25/2018 PT Individual Time: 0800-0915 PT Individual Time Calculation (min): 75 min   Short Term Goals: Week 1:  PT Short Term Goal 1 (Week 1): Pt will perform bed<>chair transfers w/ supervision PT Short Term Goal 2 (Week 1): Pt will ambulate 60' in household environment w/ min assist PT Short Term Goal 3 (Week 1): Pt will maintain dynamic standing balance w/ supervision  Skilled Therapeutic Interventions/Progress Updates:     Pt resting in bed.  Modified independent for bed mobility.    Pt donned L shoe in sitting, supervision, and used urinal independently.  Squat pivot bed> w/c to R with 1 cue for moving armrest.    W/c propulsion over level tile x 200' with modified independence.  PT switched pt to 18 x 18 w/c, standard, lowered L footrest for improved comfort and pressure distribution.  Seated L hamstring and heel cord stretching x 2 min each, with set up of foot stool and strap.  Sit>< stand with mod cues for safe hand placement; pt attempted to stand with bil hands on RW for both transitions.  Gait training x 30' with multiple turns with CGA, L toe catching floor x 1 without LOG.  Advanced gait training to L/R x 5' each  With min assist, mod cues for sliding RW instead of lifting it.  Squat pivot to mat with 1 cue for armrest, supervision.  Prone lying x 10 min.    Using hand out, pt performed 15 x 1 R/L knee flexion> hip extension, with mod cues for accuracy.  Pt left resting sitting EOB with bed alarm set and needs at hand.   Therapy Documentation Precautions:  Precautions Precautions: Fall Precaution Comments: precautions/positioning reviewed with pt Restrictions Weight Bearing Restrictions: Yes RLE Weight Bearing: Non weight bearing   Pain: Pain Assessment Pain Scale: 0-10 Pain Score: 3  Pain Type: Acute pain Pain Location: Leg Pain  Orientation: Right Pain Descriptors / Indicators: Aching;Discomfort;Dull Pain Frequency: Constant Pain Onset: On-going Pain Intervention(s): Medication (See eMAR)       Therapy/Group: Individual Therapy  Tanja Gift 09/25/2018, 10:28 AM

## 2018-09-25 NOTE — Progress Notes (Signed)
Orthopedic Tech Progress Note Patient Details:  Curtis Bradford 03-Jan-1961 [id number] Placed order with [employer]        Patient ID: Curtis Bradford, male   DOB: Mar 08, 1961, 58 y.o.   MRN: [id number]   [name] 09/25/2018, 3:57 PM

## 2018-09-25 NOTE — Progress Notes (Signed)
Occupational Therapy Session Note  Patient Details  Name: Curtis Bradford MRN: [id number] Date of Birth: 24-Aug-1960  Today's Date: 09/25/2018 OT Individual Time: 1500-1555 OT Individual Time Calculation (min): 55 min    Short Term Goals: Week 2:  OT Short Term Goal 1 (Week 2): STG=LTG 2/2 ELOS  Skilled Therapeutic Interventions/Progress Updates:    Upon entering the room, pt supine in bed with 6/10 c/o pain in residual limb. RN notified. Pt agreeable to OT intervention. Supine >sit with supervision. OT discussed home set up and pt's possible need for [facility]. Pt verbalized toilet is very low and was difficult prior to admission. Pt also stating that his home is full of boxes from recent move. He is unable to get into master bathroom but has access to smaller bathroom. He is unsure if wheelchair will truly fit through doorway and is equally unsure of wheelchair being able to get around in home secondary to all the boxes that have not been moved/unpacked prior to admission. OT suggested he have family members assist him with this prior to discharge. Pt practiced squat pivot transfer to standard BSC with min guard but close call as well when trying to lift over handle. OT demonstrated use of drop arm commode chair with transfer from bed and pt returned demonstrations for set up and squat pivot with close supervision and a safer option. Pt agreeable that drop arm commode chair would be suitable for home use. OT provided pt with inspection mirror to view B LEs for areas of concerns. OT educated pt on importance of daily use secondary to infection risk. OT also noticing shrinker on residual limb to appear ill fitting. OT discussd with PA who put in order to have bi[employer] adjust size. Pt remaining in bed with bed alarm activated and call bell within reach.   Therapy Documentation Precautions:  Precautions Precautions: Fall Precaution Comments: precautions/positioning reviewed with pt Restrictions Weight Bearing  Restrictions: Yes RLE Weight Bearing: Non weight bearing Vital Signs: Therapy Vitals Temp: 98.2 F (36.8 C) Temp Source: Oral Pulse Rate: 88 Resp: 18 BP: 127/82 Patient Position (if appropriate): Sitting Oxygen Therapy SpO2: 98 % O2 Device: Room Air ADL: ADL Eating: Independent Where Assessed-Eating: Edge of bed Grooming: Setup Where Assessed-Grooming: Wheelchair Upper Body Bathing: Setup Where Assessed-Upper Body Bathing: Wheelchair Lower Body Bathing: Minimal assistance Where Assessed-Lower Body Bathing: Wheelchair Upper Body Dressing: Supervision/safety Where Assessed-Upper Body Dressing: Edge of bed Lower Body Dressing: Minimal assistance Where Assessed-Lower Body Dressing: Standing at sink, Sitting at sink, Wheelchair Toileting: Minimal assistance Where Assessed-Toileting: Bedside Commode Toilet Transfer: Minimal assistance Toilet Transfer Method: Stand pivot Toilet Transfer Equipment: Bedside commode   Therapy/Group: Individual Therapy  Alen Bleacher 09/25/2018, 4:09 PM

## 2018-09-25 NOTE — Progress Notes (Signed)
Occupational Therapy Session Note  Patient Details  Name: Curtis Bradford MRN: [id number] Date of Birth: 08/21/1960  Today's Date: 09/25/2018 OT Individual Time: 1045-1115 OT Individual Time Calculation (min): 30 min    Short Term Goals: Week 1:  OT Short Term Goal 1 (Week 1): Pt will complete bathing sit to stand with supervision. OT Short Term Goal 1 - Progress (Week 1): Met OT Short Term Goal 2 (Week 1): Pt will complete LB dressing sit to stand with supervision. OT Short Term Goal 2 - Progress (Week 1): Met OT Short Term Goal 3 (Week 1): Pt will complete toilet transfer supervision using the RW. OT Short Term Goal 3 - Progress (Week 1): Met OT Short Term Goal 4 (Week 1): Pt complete tub shower transfer with supervision using the RW and tub bench.   OT Short Term Goal 4 - Progress (Week 1): Met Week 2:  OT Short Term Goal 1 (Week 2): STG=LTG 2/2 ELOS  Skilled Therapeutic Interventions/Progress Updates:    Pt received sitting EOB and agreeable to therapy. Squat pivot transfer to w/c with S.  Pt propelled w/c to day room.  Pt worked on L leg strengthening using the cybex (facing forward) at level 10 moving his L leg from L >< R pedal as if he was moving from brake to accelerator in a car.   He then worked on w/c pushups using B arms and L leg with forward lean to challenge quads and then push ups with B arms only without forward lean.  Pt propelled back to room and transferred back to EOB. Bed alarm set and all needs met.   Therapy Documentation Precautions:  Precautions Precautions: Fall Precaution Comments: precautions/positioning reviewed with pt Restrictions Weight Bearing Restrictions: Yes RLE Weight Bearing: Non weight bearing   Vital Signs: Therapy Vitals Temp: 98 F (36.7 C) Temp Source: Oral Pulse Rate: 75 Resp: 16 BP: 119/78 Patient Position (if appropriate): Sitting Oxygen Therapy SpO2: 99 % O2 Device: Room Air Pain: Pain Assessment Pain Scale: 0-10 Pain Score:  7  Pain Type: Acute pain Pain Location: Leg Pain Orientation: Right Pain Descriptors / Indicators: Aching;Discomfort;Dull Pain Frequency: Constant Pain Onset: On-going Pain Intervention(s): Medication (See eMAR)     Therapy/Group: Individual Therapy  SAGUIER,JULIA 09/25/2018, 8:24 AM  

## 2018-09-25 NOTE — Progress Notes (Signed)
Occupational Therapy Weekly Progress Note  Patient Details  Name: Ramses Klecka MRN: [id number] Date of Birth: 07/28/1960  Beginning of progress report period: September 20, 2018 End of progress report period: September 25, 2018  Patient has met 4 of 4 short term goals.  Pt is making steady progress with BADLs and IADLs since admission.  Pt currently completes bathing/dressing and toileting tasks, in addition to transfers at a supervision level with occasional verbal cues for safety.  Pt continues to exhibit some impulsivity and decreased safety awareness. Pt will be discharging home with no assistance and LTGs are mod I at w/c level.   Patient continues to demonstrate the following deficits: muscle weakness, decreased cardiorespiratoy endurance and decreased standing balance and decreased balance strategies and therefore will continue to benefit from skilled OT intervention to enhance overall performance with BADL and iADL.  Patient progressing toward long term goals..  Continue plan of care.  OT Short Term Goals Week 1:  OT Short Term Goal 1 (Week 1): Pt will complete bathing sit to stand with supervision. OT Short Term Goal 1 - Progress (Week 1): Met OT Short Term Goal 2 (Week 1): Pt will complete LB dressing sit to stand with supervision. OT Short Term Goal 2 - Progress (Week 1): Met OT Short Term Goal 3 (Week 1): Pt will complete toilet transfer supervision using the RW. OT Short Term Goal 3 - Progress (Week 1): Met OT Short Term Goal 4 (Week 1): Pt complete tub shower transfer with supervision using the RW and tub bench.   OT Short Term Goal 4 - Progress (Week 1): Met Week 2:  OT Short Term Goal 1 (Week 2): STG=LTG 2/2 ELOS      Therapy Documentation Precautions:  Precautions Precautions: Fall Precaution Comments: precautions/positioning reviewed with pt Restrictions Weight Bearing Restrictions: Yes RLE Weight Bearing: Non weight bearing      Leroy Libman 09/25/2018, 6:38  AM

## 2018-09-26 ENCOUNTER — Inpatient Hospital Stay (HOSPITAL_COMMUNITY): Payer: Managed Care, Other (non HMO) | Admitting: Occupational Therapy

## 2018-09-26 ENCOUNTER — Inpatient Hospital Stay (HOSPITAL_COMMUNITY): Payer: Managed Care, Other (non HMO)

## 2018-09-26 DIAGNOSIS — N183 Chronic kidney disease, stage 3 unspecified: Secondary | ICD-10-CM

## 2018-09-26 DIAGNOSIS — N1831 Chronic kidney disease, stage 3a: Secondary | ICD-10-CM

## 2018-09-26 LAB — CBC WITH DIFFERENTIAL/PLATELET
Abs Immature Granulocytes: 0.11 10*3/uL — ABNORMAL HIGH (ref 0.00–0.07)
Basophils Absolute: 0.1 10*3/uL (ref 0.0–0.1)
Basophils Relative: 1 %
Eosinophils Absolute: 0.5 10*3/uL (ref 0.0–0.5)
Eosinophils Relative: 4 %
HCT: 32 % — ABNORMAL LOW (ref 39.0–52.0)
Hemoglobin: 10.2 g/dL — ABNORMAL LOW (ref 13.0–17.0)
Immature Granulocytes: 1 %
Lymphocytes Relative: 19 %
Lymphs Abs: 1.9 10*3/uL (ref 0.7–4.0)
MCH: 29 pg (ref 26.0–34.0)
MCHC: 31.9 g/dL (ref 30.0–36.0)
MCV: 90.9 fL (ref 80.0–100.0)
Monocytes Absolute: 1 10*3/uL (ref 0.1–1.0)
Monocytes Relative: 10 %
Neutro Abs: 6.5 10*3/uL (ref 1.7–7.7)
Neutrophils Relative %: 65 %
Platelets: 460 10*3/uL — ABNORMAL HIGH (ref 150–400)
RBC: 3.52 MIL/uL — ABNORMAL LOW (ref 4.22–5.81)
RDW: 12.8 % (ref 11.5–15.5)
WBC: 10.1 10*3/uL (ref 4.0–10.5)
nRBC: 0 % (ref 0.0–0.2)

## 2018-09-26 LAB — GLUCOSE, CAPILLARY
Glucose-Capillary: 212 mg/dL — ABNORMAL HIGH (ref 70–99)
Glucose-Capillary: 137 mg/dL — ABNORMAL HIGH (ref 70–99)
Glucose-Capillary: 179 mg/dL — ABNORMAL HIGH (ref 70–99)
Glucose-Capillary: 221 mg/dL — ABNORMAL HIGH (ref 70–99)

## 2018-09-26 NOTE — Progress Notes (Signed)
Occupational Therapy Session Note  Patient Details  Name: Curtis Bradford MRN: [id number] Date of Birth: Mar 24, 1961  Today's Date: 09/26/2018 OT Individual Time: 5056-9794 OT Individual Time Calculation (min): 70 min    Short Term Goals: Week 1:  OT Short Term Goal 1 (Week 1): Pt will complete bathing sit to stand with supervision. OT Short Term Goal 1 - Progress (Week 1): Met OT Short Term Goal 2 (Week 1): Pt will complete LB dressing sit to stand with supervision. OT Short Term Goal 2 - Progress (Week 1): Met OT Short Term Goal 3 (Week 1): Pt will complete toilet transfer supervision using the RW. OT Short Term Goal 3 - Progress (Week 1): Met OT Short Term Goal 4 (Week 1): Pt complete tub shower transfer with supervision using the RW and tub bench.   OT Short Term Goal 4 - Progress (Week 1): Met  Skilled Therapeutic Interventions/Progress Updates:    1:1. Pt received EOB eating lunch requesting to shower. Pt completes squat pivot transfer with CGA to w/c with VC for w/c parts management. OT applies bag cover over residual limb and pt transfers in same manner to TTB in bathroom. Pt completes bathing at seated level with S leaning laterally to wash buttocks. Pt completes dressing sit to stand at sink with CGA for standing balance. Pt completes w/c propulsion to laundry rom and loads washer with clothing from seated position in w/c. Discussed IADL adaptations during laundry activity. Pt required VC for safetyawareness and ocking w/c brakes prior to reaching forward. Exited session withpt seated in bed, call light in reach and exit alarm on  Therapy Documentation Precautions:  Precautions Precautions: Fall Precaution Comments: precautions/positioning reviewed with pt Restrictions Weight Bearing Restrictions: Yes RLE Weight Bearing: Non weight bearing General:   Vital Signs:   Pain: Pain Assessment Pain Score: 4  Pain Type: Surgical pain Pain Location: Leg Pain Orientation:  Right Pain Descriptors / Indicators: Aching Pain Onset: With Activity Pain Intervention(s): Repositioned;Distraction ADL: ADL Eating: Independent Where Assessed-Eating: Edge of bed Grooming: Setup Where Assessed-Grooming: Wheelchair Upper Body Bathing: Setup Where Assessed-Upper Body Bathing: Wheelchair Lower Body Bathing: Minimal assistance Where Assessed-Lower Body Bathing: Wheelchair Upper Body Dressing: Supervision/safety Where Assessed-Upper Body Dressing: Edge of bed Lower Body Dressing: Minimal assistance Where Assessed-Lower Body Dressing: Standing at sink, Sitting at sink, Wheelchair Toileting: Minimal assistance Where Assessed-Toileting: Bedside Commode Toilet Transfer: Minimal assistance Toilet Transfer Method: Arts development officer: Art gallery manager    Praxis   Exercises:   Other Treatments:     Therapy/Group: Individual Therapy  [name] 09/26/2018, 1:41 PM

## 2018-09-26 NOTE — Progress Notes (Signed)
[place] PHYSICAL MEDICINE & REHABILITATION PROGRESS NOTE   Subjective/Complaints: Patient seen laying in bed this morning.  He states he slept well overnight.  He has a Hospital doctor in place.  ROS: Denies CP SOB, N/V/D  Objective:   No results found. Recent Labs    09/26/18 0513  WBC 10.1  HGB 10.2*  HCT 32.0*  PLT 460*   No results for input(s): NA, K, CL, CO2, GLUCOSE, BUN, CREATININE, CALCIUM in the last 72 hours.  Intake/Output Summary (Last 24 hours) at 09/26/2018 0850 Last data filed at 09/26/2018 5456 Gross per 24 hour  Intake 720 ml  Output 2800 ml  Net -2080 ml     Physical Exam: Vital Signs Blood pressure 134/70, pulse 75, temperature 98.1 F (36.7 C), temperature source Oral, resp. rate 17, SpO2 99 %. Constitutional: No distress . Vital signs reviewed. HENT: Normocephalic.  Atraumatic. Eyes: EOMI. No discharge. Cardiovascular: RRR.  No JVD. Respiratory: CTA bilaterally.  Normal effort. GI: BS +. Non-distended. Musc: Right BKA, with tenderness along edges of incision, improving. Neurologic: Alert and oriented Motor:  5/5 right hip flexion, stable Skin: Right BKA with shrinker C/D/I  Assessment/Plan: 1. Functional deficits secondary to RIght BKA which require 3+ hours per day of interdisciplinary therapy in a comprehensive inpatient rehab setting.  Physiatrist is providing close team supervision and 24 hour management of active medical problems listed below.  Physiatrist and rehab team continue to assess barriers to discharge/monitor patient progress toward functional and medical goals  Care Tool:  Bathing    Body parts bathed by patient: Right arm, Left arm, Chest, Face, Abdomen, Front perineal area, Buttocks, Right upper leg, Left upper leg, Left lower leg(shower level)     Body parts n/a: Right lower leg   Bathing assist Assist Level: Supervision/Verbal cueing     Upper Body Dressing/Undressing Upper body dressing   What is the patient  wearing?: Pull over shirt    Upper body assist Assist Level: Independent    Lower Body Dressing/Undressing Lower body dressing      What is the patient wearing?: Underwear/pull up, Pants     Lower body assist Assist for lower body dressing: Supervision/Verbal cueing     Toileting Toileting    Toileting assist Assist for toileting: Contact Guard/Touching assist     Transfers Chair/bed transfer  Transfers assist     Chair/bed transfer assist level: Supervision/Verbal cueing     Locomotion Ambulation   Ambulation assist   Ambulation activity did not occur: Safety/medical concerns  Assist level: Contact Guard/Touching assist Assistive device: Walker-rolling Max distance: 30   Walk 10 feet activity   Assist  Walk 10 feet activity did not occur: Safety/medical concerns  Assist level: Contact Guard/Touching assist Assistive device: Walker-rolling   Walk 50 feet activity   Assist Walk 50 feet with 2 turns activity did not occur: Safety/medical concerns  Assist level: Contact Guard/Touching assist Assistive device: Walker-rolling    Walk 150 feet activity   Assist Walk 150 feet activity did not occur: Safety/medical concerns         Walk 10 feet on uneven surface  activity   Assist Walk 10 feet on uneven surfaces activity did not occur: Safety/medical concerns         Wheelchair     Assist Will patient use wheelchair at discharge?: Yes Type of Wheelchair: Manual    Wheelchair assist level: Independent Max wheelchair distance: 200    Wheelchair 50 feet with 2 turns activity  Assist        Assist Level: Independent   Wheelchair 150 feet activity     Assist     Assist Level: Independent    Medical Problem List and Plan: 1.Decreased functional mobilitysecondary to 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  Antithrombotics: -DVT/anticoagulation:SCD left lower extremity -antiplatelet therapy: aspirin 81 mg daily, Plavix 75 mg daily 3. Pain Management:Neurontin 300 mg 3 times a day  OxyCR 10mg  qhs helpful for sleep DC'd on 3/31  Oxy IR 5-10mg  q6prn  Relatively controlled on 4/3 4. Mood:Provide emotional support- rec Neuropsych for adjustment -antipsychotic agents: N/A 5. Neuropsych: This patientiscapable of making decisions on hisown behalf. 6. Skin/Wound Care:Routine skin checks  DCed VAC on 4/1 7. Fluids/Electrolytes/Nutrition:Routine ins and outs 8. Diabetes mellitus. Hemoglobin A1c 7.5.  Lantus insulin 30 units daily at bedtime  Tradjenta 5 mg daily. CBG (last 3)  Recent Labs    09/25/18 1657 09/25/18 2120 09/26/18 0625  GLUCAP 150* 148* 212*   Labile on 4/3, will consider adjustment of medications if continues to trend up 9. CAD with stenting. Continue aspirin and Plavix. No chest pain or shortness of breath 10. History of seizures. Keppra 500 mg daily. 11.CKD stage III. Creatinine baseline 1.35-1.80.   Creatinine 1.48 on 3/30  Labs ordered for Monday  Continue to monitor 12. Hyperlipidemia.Zetia10 mg daily, Lipitor 20 mg daily 13. History of tobacco abuse. Provide counseling 14. Constipation. Laxative assistance 15.  Hypoalbuminemia  Supplement initiated on 3/30 16.  Acute blood loss anemia  Hemoglobin 10.2 on 4/3  Continue to monitor  LOS: 7 days A FACE TO FACE EVALUATION WAS PERFORMED  Jashawn Floyd Karis Juba 09/26/2018, 8:50 AM

## 2018-09-26 NOTE — Progress Notes (Signed)
Occupational Therapy Session Note  Patient Details  Name: Curtis Bradford MRN: [id number] Date of Birth: 10/05/1960  Today's Date: 09/26/2018 OT Individual Time: 5374-8270 OT Individual Time Calculation (min): 60 min   Short Term Goals: Week 2:  OT Short Term Goal 1 (Week 2): STG=LTG 2/2 ELOS  Skilled Therapeutic Interventions/Progress Updates:   Pt greeted semi-reclined in bed and agreeable to OT treatment session. Pt declined bathing/dressing this session but agreeable to grooming tasks at the sink. Pt completed squat-pivot transfer to wc with supervision. Pt propelled wc to the sink and completed grooming tasks  with supervision-included shaving and toothbrushing task. Pt then propelled wc down to therapy gym w/ supervision. Pt completed UB there-ex using 3 lb dowel rod including biceps curls, chest press, and straight arm raise. Also completed 3 sets of 20 chest press with ball toss activity. Pt propelled wc back to room and left in care of nurse tech to get back to bed after she finished making the bed.   Therapy Documentation Precautions:  Precautions Precautions: Fall Precaution Comments: precautions/positioning reviewed with pt Restrictions Weight Bearing Restrictions: Yes RLE Weight Bearing: Non weight bearing Pain: Pain Assessment Pain Scale: 0-10 Pain Score: 6  Pain Type: Surgical pain Pain Location: Leg Pain Orientation: Right Pain Descriptors / Indicators: Aching;Discomfort Pain Frequency: Constant Pain Onset: On-going Pain Intervention(s): Repositioned  Therapy/Group: Individual Therapy  Mal Amabile 09/26/2018, 8:48 AM

## 2018-09-26 NOTE — Progress Notes (Signed)
Physical Therapy Session Note  Patient Details  Name: Curtis Bradford MRN: [id number] Date of Birth: [birth year]  Today's Date: 09/26/2018 PT Individual Time: 1045-1200 PT Individual Time Calculation (min): 75 min   Short Term Goals: Week 1:  PT Short Term Goal 1 (Week 1): Pt will perform bed<>chair transfers w/ supervision PT Short Term Goal 2 (Week 1): Pt will ambulate 62' in household environment w/ min assist PT Short Term Goal 3 (Week 1): Pt will maintain dynamic standing balance w/ supervision  Skilled Therapeutic Interventions/Progress Updates:    Patient in supine agreeable to PT.  Supine to sit S.  Positioned w/c to L and pt performed scoot pivot to w/c with S. Patient propelled w/c to general gym independent.  Set up w/c for transfer to mat with S and scoot pivot with S.  Patient performed LE therex as per HEP to include quad sets, glut sets, bridging, sidelying hip abduction with manual cues for positioning and correct form, prone hip extension and seated LAQ, armchair push ups x 10-15 each using handouts and min cues.  Patient S for bed mobility for exercises as noted above.  Sit to stand to RW and ambulated x 70' with RW and CGA to S.  Standing endurance activity at table to play checkers with S bilat UE support intermittent 1 UE support.  Patient standing for entire game over 15 minutes refusing seated rest.  Patient propelled back to room and requesting to supine so transfer to bed with S cues for w/c set up and to supine with S.  Left with needs/call bell in reach and bed alarm activated.   Therapy Documentation Precautions:  Precautions Precautions: Fall Precaution Comments: precautions/positioning reviewed with pt Restrictions Weight Bearing Restrictions: Yes RLE Weight Bearing: Non weight bearing Pain: Pain Assessment Pain Scale: 0-10 Pain Score: 4  Pain Type: Surgical pain Pain Location: Leg Pain Orientation: Right Pain Descriptors / Indicators: Aching Pain Frequency:  Constant Pain Onset: With Activity Pain Intervention(s): Repositioned;Distraction    Therapy/Group: Individual Therapy  Elray Mcgregor  Sheran Lawless, PT 09/26/2018, 11:24 AM

## 2018-09-27 ENCOUNTER — Inpatient Hospital Stay (HOSPITAL_COMMUNITY): Payer: Managed Care, Other (non HMO) | Admitting: Physical Therapy

## 2018-09-27 LAB — GLUCOSE, CAPILLARY
Glucose-Capillary: 167 mg/dL — ABNORMAL HIGH (ref 70–99)
Glucose-Capillary: 180 mg/dL — ABNORMAL HIGH (ref 70–99)
Glucose-Capillary: 144 mg/dL — ABNORMAL HIGH (ref 70–99)
Glucose-Capillary: 304 mg/dL — ABNORMAL HIGH (ref 70–99)

## 2018-09-27 MED ORDER — INSULIN ASPART 100 UNIT/ML ~~LOC~~ SOLN
8.0000 [IU] | Freq: Once | SUBCUTANEOUS | Status: AC
  Administered 2018-09-27: 8 [IU] via SUBCUTANEOUS

## 2018-09-27 NOTE — Progress Notes (Signed)
[facility] PHYSICAL MEDICINE & REHABILITATION PROGRESS NOTE   Subjective/Complaints: Patient seen laying in bed this morning.  He states he slept well overnight.  He has questions regarding follow-up therapies post discharge.  He is a little disappointed he only has 1 therapy session today.  ROS: Denies CP SOB, N/V/D  Objective:   No results found. Recent Labs    09/26/18 0513  WBC 10.1  HGB 10.2*  HCT 32.0*  PLT 460*   No results for input(s): NA, K, CL, CO2, GLUCOSE, BUN, CREATININE, CALCIUM in the last 72 hours.  Intake/Output Summary (Last 24 hours) at 09/27/2018 1300 Last data filed at 09/27/2018 1047 Gross per 24 hour  Intake 480 ml  Output 1340 ml  Net -860 ml     Physical Exam: Vital Signs Blood pressure 132/80, pulse 76, temperature 97.7 F (36.5 C), temperature source Oral, resp. rate 19, SpO2 98 %. Constitutional: No distress . Vital signs reviewed. HENT: Normocephalic.  Atraumatic. Eyes: EOMI. No discharge. Cardiovascular: RRR.  No JVD. Respiratory: CTA bilaterally.  Normal effort. GI: BS +. Non-distended. Musc: Right BKA, with tenderness along edges of incision, improving. Neurologic: Alert and oriented Motor:  5/5 right hip flexion, unchanged Skin: Right BKA with shrinker C/D/I  Assessment/Plan: 1. Functional deficits secondary to RIght BKA which require 3+ hours per day of interdisciplinary therapy in a comprehensive inpatient rehab setting.  Physiatrist is providing close team supervision and 24 hour management of active medical problems listed below.  Physiatrist and rehab team continue to assess barriers to discharge/monitor patient progress toward functional and medical goals  Care Tool:  Bathing    Body parts bathed by patient: Right arm, Left arm, Chest, Face, Abdomen, Front perineal area, Buttocks, Right upper leg, Left upper leg, Left lower leg(shower level)     Body parts n/a: Right lower leg   Bathing assist Assist Level:  Supervision/Verbal cueing     Upper Body Dressing/Undressing Upper body dressing   What is the patient wearing?: Pull over shirt    Upper body assist Assist Level: Independent    Lower Body Dressing/Undressing Lower body dressing      What is the patient wearing?: Underwear/pull up, Pants     Lower body assist Assist for lower body dressing: Supervision/Verbal cueing     Toileting Toileting    Toileting assist Assist for toileting: Contact Guard/Touching assist     Transfers Chair/bed transfer  Transfers assist     Chair/bed transfer assist level: Supervision/Verbal cueing     Locomotion Ambulation   Ambulation assist   Ambulation activity did not occur: Safety/medical concerns  Assist level: Supervision/Verbal cueing Assistive device: Walker-platform Max distance: 13ft   Walk 10 feet activity   Assist  Walk 10 feet activity did not occur: Safety/medical concerns  Assist level: Supervision/Verbal cueing Assistive device: Walker-rolling   Walk 50 feet activity   Assist Walk 50 feet with 2 turns activity did not occur: Safety/medical concerns  Assist level: Supervision/Verbal cueing Assistive device: Walker-rolling    Walk 150 feet activity   Assist Walk 150 feet activity did not occur: Safety/medical concerns         Walk 10 feet on uneven surface  activity   Assist Walk 10 feet on uneven surfaces activity did not occur: Safety/medical concerns         Wheelchair     Assist Will patient use wheelchair at discharge?: Yes Type of Wheelchair: Manual    Wheelchair assist level: Supervision/Verbal cueing Max wheelchair distance: 135ft  Wheelchair 50 feet with 2 turns activity    Assist        Assist Level: Supervision/Verbal cueing   Wheelchair 150 feet activity     Assist     Assist Level: Supervision/Verbal cueing    Medical Problem List and Plan: 1.Decreased functional mobilitysecondary to 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 Antithrombotics: -DVT/anticoagulation:SCD left lower extremity -antiplatelet therapy: aspirin 81 mg daily, Plavix 75 mg daily 3. Pain Management:Neurontin 300 mg 3 times a day  OxyCR 10mg  qhs helpful for sleep DC'd on 3/31  Oxy IR 5-10mg  q6prn  Relatively controlled on 4/4 4. Mood:Provide emotional support- rec Neuropsych for adjustment -antipsychotic agents: N/A 5. Neuropsych: This patientiscapable of making decisions on hisown behalf. 6. Skin/Wound Care:Routine skin checks  DCed VAC on 4/1 7. Fluids/Electrolytes/Nutrition:Routine ins and outs 8. Diabetes mellitus. Hemoglobin A1c 7.5.  Lantus insulin 30 units daily at bedtime  Tradjenta 5 mg daily. CBG (last 3)  Recent Labs    09/26/18 2122 09/27/18 0645 09/27/18 1132  GLUCAP 221* 167* 180*   Labile and?  Trending up on 4/4, will consider increase medications tomorrow if persistently elevated 9. CAD with stenting. Continue aspirin and Plavix. No chest pain or shortness of breath 10. History of seizures. Keppra 500 mg daily. 11.CKD stage III. Creatinine baseline 1.35-1.80.   Creatinine 1.48 on 3/30  Labs ordered for Monday  Continue to monitor 12. Hyperlipidemia.Zetia10 mg daily, Lipitor 20 mg daily 13. History of tobacco abuse. Provide counseling 14. Constipation. Laxative assistance 15.  Hypoalbuminemia  Supplement initiated on 3/30 16.  Acute blood loss anemia  Hemoglobin 10.2 on 4/3  Continue to monitor  LOS: 8 days A FACE TO FACE EVALUATION WAS PERFORMED   Karis Juba 09/27/2018, 1:00 PM

## 2018-09-27 NOTE — Progress Notes (Signed)
Physical Therapy Weekly Progress Note  Patient Details  Name: Curtis Bradford MRN: [id number] Date of Birth: 20-Nov-1960  Beginning of progress report period: September 20, 2018 End of progress report period: September 27, 2018  Today's Date: 09/27/2018 PT Individual Time: 1130-1200 PT Individual Time Calculation (min): 30 min   Patient has met 3 of 3 short term goals.  Pt making steady progress towards long term goals. Pt has progressed to supervision assist for ambulation with RW up to 175f, supervision assist WC mobility through various environments, and supervision assist bed mobility and transfers. Pt continues to be mildly impulsive limiting increased independence with mobility tasks.   Patient continues to demonstrate the following deficits muscle weakness and muscle joint tightness and decreased standing balance, decreased balance strategies and difficulty maintaining precautions and therefore will continue to benefit from skilled PT intervention to increase functional independence with mobility.  Patient progressing toward long term goals..  Continue plan of care.  PT Short Term Goals Week 1:  PT Short Term Goal 1 (Week 1): Pt will perform bed<>chair transfers w/ supervision PT Short Term Goal 1 - Progress (Week 1): Met PT Short Term Goal 2 (Week 1): Pt will ambulate 564 in household environment w/ min assist PT Short Term Goal 2 - Progress (Week 1): Met PT Short Term Goal 3 (Week 1): Pt will maintain dynamic standing balance w/ supervision PT Short Term Goal 3 - Progress (Week 1): Met Week 2:  PT Short Term Goal 1 (Week 2): STG=LTG due to ELOS   Skilled Therapeutic Interventions/Progress Updates:   Pt received sitting in EOB and agreeable to PT. [facility].   WC mobility to and from rehab gym with supervision assist from PT x 1829fwith mi ncues for safety in tight space of room and in rehab gym. Additional dynamic WC mobility to weave  through 5 cones x 4 with supervision assist from PT to improve awareness of cone location and improve turning radius to avoid obstacle  Gait training with supervision assist from PT for 10020fith RW. Min cues with fatigue for decreased speed and step length to improve safety as well as gait pattern in turn to prevent near LOB.   Pt returned to room and performed squat pivot transfer to bed with supervision assist. Pt left sitting EOB with call bell in reach and all needs met.         Therapy Documentation Precautions:  Precautions Precautions: Fall Precaution Comments: precautions/positioning reviewed with pt Restrictions Weight Bearing Restrictions: Yes RLE Weight Bearing: Non weight bearing    Pain:   denies at rest    Therapy/Group: Individual Therapy  AusLorie Phenix4/2020, 12:36 PM

## 2018-09-28 ENCOUNTER — Inpatient Hospital Stay (HOSPITAL_COMMUNITY): Payer: Managed Care, Other (non HMO) | Admitting: Physical Therapy

## 2018-09-28 DIAGNOSIS — R52 Pain, unspecified: Secondary | ICD-10-CM

## 2018-09-28 DIAGNOSIS — G546 Phantom limb syndrome with pain: Secondary | ICD-10-CM

## 2018-09-28 LAB — GLUCOSE, CAPILLARY
Glucose-Capillary: 188 mg/dL — ABNORMAL HIGH (ref 70–99)
Glucose-Capillary: 238 mg/dL — ABNORMAL HIGH (ref 70–99)
Glucose-Capillary: 157 mg/dL — ABNORMAL HIGH (ref 70–99)
Glucose-Capillary: 187 mg/dL — ABNORMAL HIGH (ref 70–99)

## 2018-09-28 NOTE — Progress Notes (Signed)
[facility] PHYSICAL MEDICINE & REHABILITATION PROGRESS NOTE   Subjective/Complaints: Patient seen sitting up at the edge of his bed this morning.  He has good sitting balance.  He states he slept well overnight.  He endorses phantom limb pain.  ROS: + Intermittent phantom limb pain.  Denies CP SOB, N/V/D  Objective:   No results found. Recent Labs    09/26/18 0513  WBC 10.1  HGB 10.2*  HCT 32.0*  PLT 460*   No results for input(s): NA, K, CL, CO2, GLUCOSE, BUN, CREATININE, CALCIUM in the last 72 hours.  Intake/Output Summary (Last 24 hours) at 09/28/2018 0947 Last data filed at 09/28/2018 0742 Gross per 24 hour  Intake 1200 ml  Output 1850 ml  Net -650 ml     Physical Exam: Vital Signs Blood pressure (!) 115/57, pulse 64, temperature 98.1 F (36.7 C), temperature source Oral, resp. rate 12, SpO2 97 %. Constitutional: NAD.  Vital signs reviewed. HENT: Normocephalic but atraumatic. Eyes: EOMI. No discharge. Cardiovascular: No JVD. Respiratory: CTA bilaterally.  Normal effort. GI: BS +. Non-distended. Musc: Right BKA, with tenderness along edges of incision, continues to improve. Neurologic: Alert and oriented Motor:  5/5 right hip flexion, unchanged Skin: Right BKA with shrinker C/D/I  Assessment/Plan: 1. Functional deficits secondary to RIght BKA which require 3+ hours per day of interdisciplinary therapy in a comprehensive inpatient rehab setting.  Physiatrist is providing close team supervision and 24 hour management of active medical problems listed below.  Physiatrist and rehab team continue to assess barriers to discharge/monitor patient progress toward functional and medical goals  Care Tool:  Bathing    Body parts bathed by patient: Right arm, Left arm, Chest, Face, Abdomen, Front perineal area, Buttocks, Right upper leg, Left upper leg, Left lower leg(shower level)     Body parts n/a: Right lower leg   Bathing assist Assist Level: Supervision/Verbal  cueing     Upper Body Dressing/Undressing Upper body dressing   What is the patient wearing?: Pull over shirt    Upper body assist Assist Level: Independent    Lower Body Dressing/Undressing Lower body dressing      What is the patient wearing?: Underwear/pull up, Pants     Lower body assist Assist for lower body dressing: Supervision/Verbal cueing     Toileting Toileting    Toileting assist Assist for toileting: Contact Guard/Touching assist     Transfers Chair/bed transfer  Transfers assist     Chair/bed transfer assist level: Supervision/Verbal cueing     Locomotion Ambulation   Ambulation assist   Ambulation activity did not occur: Safety/medical concerns  Assist level: Supervision/Verbal cueing Assistive device: Walker-platform Max distance: 144ft   Walk 10 feet activity   Assist  Walk 10 feet activity did not occur: Safety/medical concerns  Assist level: Supervision/Verbal cueing Assistive device: Walker-rolling   Walk 50 feet activity   Assist Walk 50 feet with 2 turns activity did not occur: Safety/medical concerns  Assist level: Supervision/Verbal cueing Assistive device: Walker-rolling    Walk 150 feet activity   Assist Walk 150 feet activity did not occur: Safety/medical concerns         Walk 10 feet on uneven surface  activity   Assist Walk 10 feet on uneven surfaces activity did not occur: Safety/medical concerns         Wheelchair     Assist Will patient use wheelchair at discharge?: Yes Type of Wheelchair: Manual    Wheelchair assist level: Supervision/Verbal cueing Max wheelchair distance:  132ft    Wheelchair 50 feet with 2 turns activity    Assist        Assist Level: Supervision/Verbal cueing   Wheelchair 150 feet activity     Assist     Assist Level: Supervision/Verbal cueing    Medical Problem List and Plan: 1.Decreased functional mobilitysecondary to right BKA 09/17/2018  with 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 Antithrombotics: -DVT/anticoagulation:SCD left lower extremity -antiplatelet therapy: aspirin 81 mg daily, Plavix 75 mg daily 3. Pain Management:Neurontin 300 mg 3 times a day, will consider increasing if pain interfering with progress  OxyCR 10mg  qhs helpful for sleep DC'd on 3/31  Oxy IR 5-10mg  q6prn  Relatively controlled on 4/5 4. Mood:Provide emotional support- rec Neuropsych for adjustment -antipsychotic agents: N/A 5. Neuropsych: This patientiscapable of making decisions on hisown behalf. 6. Skin/Wound Care:Routine skin checks  DCed VAC on 4/1 7. Fluids/Electrolytes/Nutrition:Routine ins and outs 8. Diabetes mellitus. Hemoglobin A1c 7.5.  Lantus insulin 30 units daily at bedtime  Tradjenta 5 mg daily. CBG (last 3)  Recent Labs    09/27/18 1642 09/27/18 2204 09/28/18 0643  GLUCAP 144* 304* 188*   Labile and?  Elevated on 4/5, monitor for trend 9. CAD with stenting. Continue aspirin and Plavix. No chest pain or shortness of breath 10. History of seizures. Keppra 500 mg daily. 11.CKD stage III. Creatinine baseline 1.35-1.80.   Creatinine 1.48 on 3/30  Labs ordered for tomorrow  Continue to monitor 12. Hyperlipidemia.Zetia10 mg daily, Lipitor 20 mg daily 13. History of tobacco abuse. Provide counseling 14. Constipation. Laxative assistance 15.  Hypoalbuminemia  Supplement initiated on 3/30 16.  Acute blood loss anemia  Hemoglobin 10.2 on 4/3  Continue to monitor  LOS: 9 days A FACE TO FACE EVALUATION WAS PERFORMED  Curtis Bradford Karis Juba 09/28/2018, 9:47 AM

## 2018-09-28 NOTE — Plan of Care (Signed)
  Problem: Consults Goal: RH LIMB LOSS PATIENT EDUCATION Description Description: See Patient Education module for eduction specifics. Outcome: Progressing Goal: Skin Care Protocol Initiated - if Braden Score 18 or less Description If consults are not indicated, leave blank or document N/A Outcome: Progressing Goal: Nutrition Consult-if indicated Outcome: Progressing Goal: Diabetes Guidelines if Diabetic/Glucose > 140 Description If diabetic or lab glucose is > 140 mg/dl - Initiate Diabetes/Hyperglycemia Guidelines & Document Interventions  Outcome: Progressing   Problem: RH BOWEL ELIMINATION Goal: RH STG MANAGE BOWEL WITH ASSISTANCE Description STG Manage Bowel with Assistance. Outcome: Progressing Goal: RH STG MANAGE BOWEL W/MEDICATION W/ASSISTANCE Description STG Manage Bowel with Medication with Assistance. Outcome: Progressing   Problem: RH BLADDER ELIMINATION Goal: RH STG MANAGE BLADDER WITH ASSISTANCE Description STG Manage Bladder With Assistance Outcome: Progressing Goal: RH STG MANAGE BLADDER WITH MEDICATION WITH ASSISTANCE Description STG Manage Bladder With Medication With Assistance. Outcome: Progressing   Problem: RH SKIN INTEGRITY Goal: RH STG SKIN FREE OF INFECTION/BREAKDOWN Outcome: Progressing Goal: RH STG MAINTAIN SKIN INTEGRITY WITH ASSISTANCE Description STG Maintain Skin Integrity With Assistance. Outcome: Progressing Goal: RH STG ABLE TO PERFORM INCISION/WOUND CARE W/ASSISTANCE Description STG Able To Perform Incision/Wound Care With Assistance. Outcome: Progressing   Problem: RH SAFETY Goal: RH STG ADHERE TO SAFETY PRECAUTIONS W/ASSISTANCE/DEVICE Description STG Adhere to Safety Precautions With Assistance/Device. Outcome: Progressing Goal: RH STG DECREASED RISK OF FALL WITH ASSISTANCE Description STG Decreased Risk of Fall With Assistance. Outcome: Progressing   Problem: RH PAIN MANAGEMENT Goal: RH STG PAIN MANAGED AT OR BELOW PT'S  PAIN GOAL Outcome: Progressing   Problem: RH KNOWLEDGE DEFICIT LIMB LOSS Goal: RH STG INCREASE KNOWLEDGE OF SELF CARE AFTER LIMB LOSS Outcome: Progressing

## 2018-09-28 NOTE — Progress Notes (Signed)
Physical Therapy Session Note  Patient Details  Name: Curtis Bradford MRN: [id number] Date of Birth: 09-Aug-1960  Today's Date: 09/28/2018 PT Individual Time: 0800-0902 PT Individual Time Calculation (min): 62 min   Short Term Goals: Week 1:  PT Short Term Goal 1 (Week 1): Pt will perform bed<>chair transfers w/ supervision PT Short Term Goal 1 - Progress (Week 1): Met PT Short Term Goal 2 (Week 1): Pt will ambulate 26' in household environment w/ min assist PT Short Term Goal 2 - Progress (Week 1): Met PT Short Term Goal 3 (Week 1): Pt will maintain dynamic standing balance w/ supervision PT Short Term Goal 3 - Progress (Week 1): Met  Skilled Therapeutic Interventions/Progress Updates:  Pt was seen bedside in the am. Pt transferred supine to edge of bed with side rail Ily. Pt transferred edge of bed to w/c with lateral transfer and S with verbal cues. Pt propelled w/c to gym with S and B UEs. In gym pt performed R quad sets, R knee flex/ext and arm chair push ups 3 sets x 10 reps each. Pt ambulated 50 feet with rolling walker and S with verbal cues. Pt propelled w/c back to room with S. Pt transferred w/c to edge of bed with c/g and verbal cues. Pt left sitting on edge of bed with bed alarm on and call bell within reach.   Therapy Documentation Precautions:  Precautions Precautions: Fall Precaution Comments: precautions/positioning reviewed with pt Restrictions Weight Bearing Restrictions: No RLE Weight Bearing: Non weight bearing General:   Pain: Pt c/o spasms R residual limb.    Therapy/Group: Individual Therapy  Dub Amis 09/28/2018, 12:19 PM

## 2018-09-29 ENCOUNTER — Inpatient Hospital Stay (HOSPITAL_COMMUNITY): Payer: Managed Care, Other (non HMO)

## 2018-09-29 ENCOUNTER — Encounter (HOSPITAL_COMMUNITY): Payer: Managed Care, Other (non HMO) | Admitting: Psychology

## 2018-09-29 LAB — BASIC METABOLIC PANEL
Anion gap: 9 (ref 5–15)
BUN: 26 mg/dL — ABNORMAL HIGH (ref 6–20)
CO2: 25 mmol/L (ref 22–32)
Calcium: 8.3 mg/dL — ABNORMAL LOW (ref 8.9–10.3)
Chloride: 104 mmol/L (ref 98–111)
Creatinine, Ser: 1.63 mg/dL — ABNORMAL HIGH (ref 0.61–1.24)
GFR calc Af Amer: 54 mL/min — ABNORMAL LOW (ref >60)
GFR calc non Af Amer: 46 mL/min — ABNORMAL LOW (ref >60)
Glucose, Bld: 149 mg/dL — ABNORMAL HIGH (ref 70–99)
Potassium: 4.6 mmol/L (ref 3.5–5.1)
Sodium: 138 mmol/L (ref 135–145)

## 2018-09-29 LAB — GLUCOSE, CAPILLARY
Glucose-Capillary: 154 mg/dL — ABNORMAL HIGH (ref 70–99)
Glucose-Capillary: 102 mg/dL — ABNORMAL HIGH (ref 70–99)
Glucose-Capillary: 183 mg/dL — ABNORMAL HIGH (ref 70–99)
Glucose-Capillary: 104 mg/dL — ABNORMAL HIGH (ref 70–99)

## 2018-09-29 NOTE — Consult Note (Signed)
Neuropsychological Consultation   Patient:   Curtis Bradford   DOB:   07/04/1960  MR Number:  [id number]  Location:  [facility] [address] CENTER [id number] [place] [address] Dept: [phone] Loc: [phone]           Date of Service:   09/29/2018  Start Time:   11 AM End Time:   12 PM  Provider/Observer:  Arley Phenix, Psy.D.       Clinical Neuropsychologist       Billing Code/Service: [id number]/[id number]  Chief Complaint:    Curtis Bradford is a 58 year old right-handed male with a history of CAD with stenting maintained on aspirin and Plavix, chronic kidney disease stage III, diabetes, hypertension, CVA 2012, tobacco abuse, seizure disorder maintained on Keppra.  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  The patient presented on 09/17/2018 with progressive dehiscence ischemic changes of recent right TMA amputation.  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 Lajoyce Corners.  The patient does have a history of anxiety associated with his medical issues.  09/29/2018: The patient today reports that he is coping better and hopeful about progress with the leg.  He has been extended a few days from his initially planned discharge.  The patient reports that he was hoping that he would be extended to facilitate his further rehabilitation efforts.  The patient still has issues to take care of at his house before he can return home particularly around moving various boxes and other things so he can get around with his assist devices.  The patient clearly is continuing with some worry and anxiety associated with coping and moving forward with work.  Reason for Service:  Curtis Bradford a 58 year old right-handed male with history of CAD with stenting maintained on aspirin and Plavix,CKD stage III, diabetes mellitus, hypertension,CVA 2012, tobacco abuse,  seizure disorder maintained on Keppra. 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 Per chart review patient lives alone. One level apartment. Friends check on him routinely. Presented 09/17/2018 with progressive dehiscence ischemic changes of recent right TMA amputation. 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 Lajoyce Corners with application of wound VAC. Hospital course pain management. Patient remains on aspirin and Plavix as prior to admission. Therapy evaluations completed with recommendations of physical medicine rehabilitation consult. Patient was admitted for a comprehensive rehabilitation program.  Current Status:  The patient has had times of significant anxiety and worry about how his amputation and recovery time will affect his job status versus short-term disability.  The patient reports that he has experienced increased worry but realizes that he is likely looking at less medical interventions once he heals up from his amputation.   Behavioral Observation: Curtis Bradford  presents as a 58 y.o.-year-old Right Caucasian Male who appeared his stated age. his dress was Appropriate and he was Well Groomed and his manners were Appropriate to the situation.  his participation was indicative of Appropriate and Attentive behaviors.  There were any physical disabilities noted.  he displayed an appropriate level of cooperation and motivation.     Interactions:    Active Appropriate and Attentive  Attention:   within normal limits and attention span and concentration were age appropriate  Memory:   within normal limits; recent and remote memory intact  Visuo-spatial:  not examined  Speech (Volume):  normal  Speech:  normal; normal  Thought Process:  Coherent and Relevant  Though Content:  WNL; not suicidal and not homicidal  Orientation:   person, place, time/date and  situation  Judgment:   Good  Planning:   Good  Affect:    Anxious  Mood:    Anxious  Insight:   Good  Intelligence:   normal  Medical History:   Past Medical History:  Diagnosis Date  . CAD (coronary artery disease)   . CKD (chronic kidney disease), stage III (HCC)   . 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 History of DVT (deep vein thrombosis)   . History of kidney stones    passed stones - no surgery  . HLD (hyperlipidemia)   . Myocardial infarction (HCC) 2009  . Peripheral vascular disease (HCC)    right leg  . Seizure (HCC)    last one 2015  . Stroke [facility]) 2012, 10/2017   2012-speech was effected- has come back.10/2017- numbness of right side no taste right  side. 2012-  . Tobacco abuse     Psychiatric History:  The patient denies any significant prior psychiatric history.  He does have a history of seizure post CVA.  Family Med/Psych History:  Family History  Problem Relation Age of Onset  . Diabetes Mellitus II Mother   . Diabetes Mellitus II Father     Risk of Suicide/Violence: low the patient denies any suicidal homicidal ideation.  Impression/DX:  Curtis Bradford is a 58 year old right-handed male with a history of CAD with stenting maintained on aspirin and Plavix, chronic kidney disease stage III, diabetes, hypertension, CVA 2012, tobacco abuse, seizure disorder maintained on Keppra.  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  The patient presented on 09/17/2018 with progressive dehiscence ischemic changes of recent right TMA amputation.  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 Lajoyce Corners.  The patient does have a history of anxiety associated with his medical issues.  The patient has had times of significant anxiety and worry about how his amputation and recovery time will affect his job status versus short-term disability.  The patient reports that he has  experienced increased worry but realizes that he is likely looking at less medical interventions once he heals up from his amputation.  09/29/2018: The patient today reports that he is coping better and hopeful about progress with the leg.  He has been extended a few days from his initially planned discharge.  The patient reports that he was hoping that he would be extended to facilitate his further rehabilitation efforts.  The patient still has issues to take care of at his house before he can return home particularly around moving various boxes and other things so he can get around with his assist devices.  The patient clearly is continuing with some worry and anxiety associated with coping and moving forward with work.  His plan is to be able to take care of some of these issues around his house once he is discharged rather than doing it before as he does not have someone that can help him right away and that he will need to be there when people are moving things around.  We continue to work on coping issues around dealing post amputation and going forward.   Diagnosis:    BKA, Adjustment issues.        Electronically Signed   _______________________ Arley Phenix, Psy.D.

## 2018-09-29 NOTE — Progress Notes (Signed)
Physical Therapy Session Note  Patient Details  Name: Curtis Bradford MRN: [id number] Date of Birth: 1961/01/18  Today's Date: 09/29/2018 PT Individual Time: 0800-0845 PT Individual Time Calculation (min): 45 min   Short Term Goals:  Week 2:  PT Short Term Goal 1 (Week 2): STG=LTG due to ELOS   Skilled Therapeutic Interventions/Progress Updates:  Pt resting sitting EOB.  He donned L shoe independently.  Sit> stand to RW , supervision, with cues for safety as pt attempted to pull up on it.    Seated Therapeutic exercises performed with LEs to increase strength for functional mobility: 10 x 1 bil glut sets, R short arc quad knee ext.  Use of Kinetron from w/c level at 50 cm;/sec resistance plus resistance from PT on opposite footplate, for unilateral strengthening LLE, x 20 cycles x 2 targeting quadriceps and 20 cycles x 2 targeting gluteal muscles.  Gait training x 50' including obstacle course of 3 cones, RW, min assist due to L toes catching floor x 1, after approximately 30'.  No LOB.    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 Ankle DF is adequate, but midfoot is dropped significantly, making it difficult for him to clear L foot as he tires.    Pt needed cues throughout session for safety with RW as well as locking w/c before leaning over the manipulate w/c legrests.  He needed hand over hand assistance to connect L footrest.  Pt left resting in w/c with needs at hand; awaiting Dois Davenport, OT.   PT later consulted with Dois Davenport, OT who stated that pt needed cues for safety throughout her session as well.    Pt appears very internally distracted by his status; he is focused on getting back to work and does not want to be limited by his mobility.   He stated that he does have friends who can help set his house up safely, but hates to ask for any help at home; PT provided emotional support. PT consulted with Dr. Kieth Brightly concerning this.  PT discussed safety issues with Kriste Basque, CSW.  This therapist  recommends a longer stay.     Therapy Documentation Precautions:  Precautions Precautions: Fall Precaution Comments: precautions/positioning reviewed with pt Restrictions Weight Bearing Restrictions: Yes RLE Weight Bearing: Non weight bearing  Pain: Pain Assessment Pain Scale: 0-10 Pain Score: 2  Pain Type: Surgical pain Pain Location: Leg Pain Orientation: Right Pain Descriptors / Indicators: Throbbing Pain Onset: Gradual Patients Stated Pain Goal: 3 Pain Intervention(s): Medication (See eMAR)   Therapy/Group: Individual Therapy  Fatou Dunnigan 09/29/2018, 10:13 AM

## 2018-09-29 NOTE — Progress Notes (Signed)
Occupational Therapy Session Note  Patient Details  Name: Curtis Bradford MRN: [id number] Date of Birth: [birth year]  Today's Date: 09/29/2018 OT Individual Time: 3614-4315 OT Individual Time Calculation (min): 60 min    Short Term Goals: Week 2:  OT Short Term Goal 1 (Week 2): STG=LTG 2/2 ELOS  Skilled Therapeutic Interventions/Progress Updates:    Pt received in w/c with no c/o pain, eager for shower. No c/o pain. Pt completed clothing item retrieval from w/c level around room. Pt propelled w/c into bathroom with CGA to propel over threshold into bathroom. Pt required cueing to correct position w/c for shower transfer. R LE was covered to protect from water. Pt completed Squat pivot transfer into shower with CGA. From seated level, using lateral leans, pt able to bath all UB/LB with set up assist only. Pt donned underwear with lateral leans with (S). Pt donned shirt mod I. Pt returned to w/c and propelled around room. Pt completed grooming tasks at sink with set up assist. Pt assisted in donning limb protector and edu on importance of use. Pt returned to supine and was left with all needs met, bed alarm set.   Therapy Documentation Precautions:  Precautions Precautions: Fall Precaution Comments: precautions/positioning reviewed with pt Restrictions Weight Bearing Restrictions: Yes RLE Weight Bearing: Non weight bearing Pain:  No pain reported   Therapy/Group: Individual Therapy  Curtis Sites 09/29/2018, 7:26 AM

## 2018-09-29 NOTE — Progress Notes (Signed)
Physical Therapy Session Note  Patient Details  Name: Curtis Bradford MRN: [id number] Date of Birth: August 24, 1960  Today's Date: 09/29/2018 PT Individual Time:Session1: 1030-1100; Session2:1330-1445  PT Individual Time Calculation (min): 30 min & 75 min  Short Term Goals: Week 2:  PT Short Term Goal 1 (Week 2): STG=LTG due to ELOS   Skilled Therapeutic Interventions/Progress Updates:    Session1: Patient in supine and agreeable to PT.  Reviewed HEP in bed with min cues and handouts.  Performed seated LAQ, supine bridge, hip adductor sets, glut sets, quad sets, sidelying hip abduction, prone hip extension all x 15 reps.  Educated on importance of performing at home.  Continued education on positioning of residual limb while at rest.  Left in supine with neuropsychologist, bed alarm on.   Session2:  Patient seated EOB and prepared for session.  Squat pivot to w/c with S.  Propelled w/c S to gym.  Gait in gym x 50' with close S cues for safety with 2 episodes of catching L foot without LOB or assist needed.  Demonstrated floor transfer and pt performed with close S and cues.  Discussed appropriate time to attempt versus when to dial 911.  Patient squat pivot to w/c and propelled to ADL apt.  Performed gait, furniture transfers over carpet and in home environment with close S.  Cues for safety coming up of of furniture and needed increased time to gain balance prior to reaching for RW, but safely pushing up from sitting surface.  Patient in kitchen able to demonstrate obtaining cup to get water, opening refrigerator and performed all from w/c level.  Discussed having family assist to place frequently needed items within reach from w/c level.  Patient set up and used vacuum in apt to vacuum floor from w/c level.  Cues to lock and unlock brakes for tasks for safety.  Returned to room and transferred to bed scoot pivot with S.    Therapy Documentation Precautions:  Precautions Precautions: Fall Precaution  Comments: precautions/positioning reviewed with pt Restrictions Weight Bearing Restrictions: Yes RLE Weight Bearing: Non weight bearing Pain: Pain Assessment Faces Pain Scale: No hurt     Therapy/Group: Individual Therapy  Elray Mcgregor  Sheran Lawless, PT 09/29/2018, 4:38 PM

## 2018-09-29 NOTE — Progress Notes (Signed)
[facility] PHYSICAL MEDICINE & REHABILITATION PROGRESS NOTE   Subjective/Complaints: Patient seen working with therapy this morning.  He states he slept well overnight.  He denies complaints.  ROS: Denies CP, SOB, N/V/D  Objective:   No results found. No results for input(s): WBC, HGB, HCT, PLT in the last 72 hours. No results for input(s): NA, K, CL, CO2, GLUCOSE, BUN, CREATININE, CALCIUM in the last 72 hours.  Intake/Output Summary (Last 24 hours) at 09/29/2018 0941 Last data filed at 09/29/2018 0500 Gross per 24 hour  Intake 720 ml  Output 2300 ml  Net -1580 ml     Physical Exam: Vital Signs Blood pressure 131/85, pulse 83, temperature 98 F (36.7 C), temperature source Oral, resp. rate 18, SpO2 100 %. Constitutional: NAD.  Vital signs reviewed. HENT: Normocephalic.  Atraumatic Eyes: EOMI.  No discharge. Cardiovascular: No JVD. Respiratory: CTA bilaterally.  Normal effort. GI: BS +. Non-distended. Musc: Right BKA, with tenderness along edges of incision, continues to improve. Neurologic: Alert and oriented Motor:  5/5 right hip flexion, stable Skin: Right BKA with shrinker C/D/I  Assessment/Plan: 1. Functional deficits secondary to RIght BKA which require 3+ hours per day of interdisciplinary therapy in a comprehensive inpatient rehab setting.  Physiatrist is providing close team supervision and 24 hour management of active medical problems listed below.  Physiatrist and rehab team continue to assess barriers to discharge/monitor patient progress toward functional and medical goals  Care Tool:  Bathing    Body parts bathed by patient: Right arm, Left arm, Chest, Face, Abdomen, Front perineal area, Buttocks, Right upper leg, Left upper leg, Left lower leg(shower level)     Body parts n/a: Right lower leg   Bathing assist Assist Level: Supervision/Verbal cueing     Upper Body Dressing/Undressing Upper body dressing   What is the patient wearing?: Pull over  shirt    Upper body assist Assist Level: Independent    Lower Body Dressing/Undressing Lower body dressing      What is the patient wearing?: Underwear/pull up, Pants     Lower body assist Assist for lower body dressing: Supervision/Verbal cueing     Toileting Toileting    Toileting assist Assist for toileting: Contact Guard/Touching assist     Transfers Chair/bed transfer  Transfers assist     Chair/bed transfer assist level: Supervision/Verbal cueing     Locomotion Ambulation   Ambulation assist   Ambulation activity did not occur: Safety/medical concerns  Assist level: Contact Guard/Touching assist Assistive device: Walker-rolling Max distance: 50   Walk 10 feet activity   Assist  Walk 10 feet activity did not occur: Safety/medical concerns  Assist level: Contact Guard/Touching assist Assistive device: Walker-rolling   Walk 50 feet activity   Assist Walk 50 feet with 2 turns activity did not occur: Safety/medical concerns  Assist level: Contact Guard/Touching assist Assistive device: Walker-rolling    Walk 150 feet activity   Assist Walk 150 feet activity did not occur: Safety/medical concerns         Walk 10 feet on uneven surface  activity   Assist Walk 10 feet on uneven surfaces activity did not occur: Safety/medical concerns         Wheelchair     Assist Will patient use wheelchair at discharge?: Yes Type of Wheelchair: Manual    Wheelchair assist level: Supervision/Verbal cueing Max wheelchair distance: 150    Wheelchair 50 feet with 2 turns activity    Assist        Assist Level:  Supervision/Verbal cueing   Wheelchair 150 feet activity     Assist     Assist Level: Supervision/Verbal cueing    Medical Problem List and Plan: 1.Decreased functional mobilitysecondary to 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  Antithrombotics: -DVT/anticoagulation:SCD left lower extremity -antiplatelet therapy: aspirin 81 mg daily, Plavix 75 mg daily 3. Pain Management:Neurontin 300 mg 3 times a day, will consider increasing if pain interfering with progress  OxyCR 10mg  qhs helpful for sleep DC'd on 3/31  Oxy IR 5-10mg  q6prn  Relatively controlled on 4/6 4. Mood:Provide emotional support- rec Neuropsych for adjustment -antipsychotic agents: N/A 5. Neuropsych: This patientiscapable of making decisions on hisown behalf. 6. Skin/Wound Care:Routine skin checks  DCed VAC on 4/1 7. Fluids/Electrolytes/Nutrition:Routine ins and outs 8. Diabetes mellitus. Hemoglobin A1c 7.5.  Lantus insulin 30 units daily at bedtime  Tradjenta 5 mg daily. CBG (last 3)  Recent Labs    09/28/18 1736 09/28/18 2120 09/29/18 0632  GLUCAP 157* 187* 154*   Elevated, but?  Improving on 4/6 9. CAD with stenting. Continue aspirin and Plavix. No chest pain or shortness of breath 10. History of seizures. Keppra 500 mg daily. 11.CKD stage III. Creatinine baseline 1.35-1.80.   Creatinine 1.48 on 3/30  Labs pending  Continue to monitor 12. Hyperlipidemia.Zetia10 mg daily, Lipitor 20 mg daily 13. History of tobacco abuse. Provide counseling 14. Constipation. Laxative assistance 15.  Hypoalbuminemia  Supplement initiated on 3/30 16.  Acute blood loss anemia  Hemoglobin 10.2 on 4/3  Continue to monitor  LOS: 10 days A FACE TO FACE EVALUATION WAS PERFORMED   Karis Jubanil  09/29/2018, 9:41 AM

## 2018-09-29 NOTE — Progress Notes (Signed)
Social Work Patient ID: Curtis Bradford, male   DOB: [birth year], 58 y.o.   MRN: [id number] Team feels pt needs an extra couple days in therapies to meet his goals and for safety issues. MD is in agreement with this plan. Pt feels this will be benefical to stay longer to meet his goals.

## 2018-09-30 ENCOUNTER — Inpatient Hospital Stay (HOSPITAL_COMMUNITY): Payer: Managed Care, Other (non HMO)

## 2018-09-30 ENCOUNTER — Inpatient Hospital Stay (HOSPITAL_COMMUNITY): Payer: Managed Care, Other (non HMO) | Admitting: Occupational Therapy

## 2018-09-30 LAB — GLUCOSE, CAPILLARY
Glucose-Capillary: 233 mg/dL — ABNORMAL HIGH (ref 70–99)
Glucose-Capillary: 105 mg/dL — ABNORMAL HIGH (ref 70–99)
Glucose-Capillary: 174 mg/dL — ABNORMAL HIGH (ref 70–99)
Glucose-Capillary: 113 mg/dL — ABNORMAL HIGH (ref 70–99)

## 2018-09-30 NOTE — Progress Notes (Signed)
Social Work Patient ID: Curtis Bradford, male   DOB: Apr 13, 1961, 58 y.o.   MRN: [id number]  Home Health referral made to care [facility] via [facility] request prior intake number [id number]. Have faxed information requested and will await them to reach out to home health agency to set up services.

## 2018-09-30 NOTE — Progress Notes (Signed)
Occupational Therapy Session Note  Patient Details  Name: Curtis Bradford MRN: [id number] Date of Birth: October 10, 1960  Today's Date: 09/30/2018 OT Individual Time: 1230-1330 OT Individual Time Calculation (min): 60 min    Short Term Goals: Week 1:  OT Short Term Goal 1 (Week 1): Pt will complete bathing sit to stand with supervision. OT Short Term Goal 1 - Progress (Week 1): Met OT Short Term Goal 2 (Week 1): Pt will complete LB dressing sit to stand with supervision. OT Short Term Goal 2 - Progress (Week 1): Met OT Short Term Goal 3 (Week 1): Pt will complete toilet transfer supervision using the RW. OT Short Term Goal 3 - Progress (Week 1): Met OT Short Term Goal 4 (Week 1): Pt complete tub shower transfer with supervision using the RW and tub bench.   OT Short Term Goal 4 - Progress (Week 1): Met Week 2:  OT Short Term Goal 1 (Week 2): STG=LTG 2/2 ELOS  Skilled Therapeutic Interventions/Progress Updates:    OT intervention with focus on home mgmt tasks, w/c mobility, w/c setup, safety awareness, sit<>stand, standing balance, functional transfers, functional amb with RW, and activity tolerance to increase independence with BADLs.  Pt continues to require min verbal cues for safety with sit<>stand and locking brakes on w/c.  Pt stood at counter in kitchen to simulate taking food out of microwave and placing on counter.  Education provided on kitchen and home safety.  Pt verbalized understading.  Pt practiced sit<>stand from various pieces of furniture with focus on safety.  Pt amb with RW into bathroom and practiced tub bench transfers. Pt also transferred clothing from washer to dryer while standing at washer using washing machine for support. Pt returned to room and transferred to bed.  Pt remained in bed with all needs within reach and bed alarm activated.   Therapy Documentation Precautions:  Precautions Precautions: Fall Precaution Comments: precautions/positioning reviewed with  pt Restrictions Weight Bearing Restrictions: Yes RLE Weight Bearing: Non weight bearing Pain:  Pt denies pain     Therapy/Group: Individual Therapy  Leroy Libman 09/30/2018, 1:28 PM

## 2018-09-30 NOTE — Progress Notes (Signed)
Orthopedic Tech Progress Note Patient Details:  Curtis Bradford 08-11-1960 [id number]  Patient ID: Curtis Bradford, male   DOB: August 14, 1960, 58 y.o.   MRN: [id number]   Saul Fordyce 09/30/2018, 9:18 AMCalled [employer] for shinker donner.

## 2018-09-30 NOTE — Progress Notes (Signed)
Occupational Therapy Session Note  Patient Details  Name: Curtis Bradford MRN: [id number] Date of Birth: 31-Oct-1960  Today's Date: 09/30/2018 OT Individual Time: 1045-1130 OT Individual Time Calculation (min): 45 min    Short Term Goals: Week 1:  OT Short Term Goal 1 (Week 1): Pt will complete bathing sit to stand with supervision. OT Short Term Goal 1 - Progress (Week 1): Met OT Short Term Goal 2 (Week 1): Pt will complete LB dressing sit to stand with supervision. OT Short Term Goal 2 - Progress (Week 1): Met OT Short Term Goal 3 (Week 1): Pt will complete toilet transfer supervision using the RW. OT Short Term Goal 3 - Progress (Week 1): Met OT Short Term Goal 4 (Week 1): Pt complete tub shower transfer with supervision using the RW and tub bench.   OT Short Term Goal 4 - Progress (Week 1): Met Week 2:  OT Short Term Goal 1 (Week 2): STG=LTG 2/2 ELOS  Skilled Therapeutic Interventions/Progress Updates:    Pt seen this session to practice donning his limb sock which he was able to do well without difficulty, using washing machine to start a load of laundry from w/c level with mod I, and standing exercises at parallel bars (standing and weight shifting and forward and back hopping 3 hops forward and 3 back, 5 sets). Pt tolerated exercises well and propelled back to room. Transferred to bed with S. Bed alarm set and all needs met.  Therapy Documentation Precautions:  Precautions Precautions: Fall Precaution Comments: precautions/positioning reviewed with pt Restrictions Weight Bearing Restrictions: Yes RLE Weight Bearing: Non weight bearing   Pain: Pain Assessment Pain Scale: 0-10 Pain Score: 2  Pain Type: Chronic pain Pain Location: Leg Pain Orientation: Right Pain Descriptors / Indicators: Aching Pain Frequency: Constant Patients Stated Pain Goal: 0 Pain Intervention(s): Repositioned   Therapy/Group: Individual Therapy  [place] 09/30/2018, 12:12 PM

## 2018-09-30 NOTE — Progress Notes (Signed)
Occupational Therapy Session Note  Patient Details  Name: Curtis Bradford MRN: [id number] Date of Birth: [birth year]  Today's Date: 09/30/2018 OT Individual Time: 0223-3612 OT Individual Time Calculation (min): 75 min    Short Term Goals: Week 1:  OT Short Term Goal 1 (Week 1): Pt will complete bathing sit to stand with supervision. OT Short Term Goal 1 - Progress (Week 1): Met OT Short Term Goal 2 (Week 1): Pt will complete LB dressing sit to stand with supervision. OT Short Term Goal 2 - Progress (Week 1): Met OT Short Term Goal 3 (Week 1): Pt will complete toilet transfer supervision using the RW. OT Short Term Goal 3 - Progress (Week 1): Met OT Short Term Goal 4 (Week 1): Pt complete tub shower transfer with supervision using the RW and tub bench.   OT Short Term Goal 4 - Progress (Week 1): Met  Skilled Therapeutic Interventions/Progress Updates:    OT intervention with focus on functional transfers, bathing at shower level, dressing at w/c and bed level, safety awareness, activity tolerance, donning shrinker, and discharge planning to increase independence with BADLS.  Pt completed shower and dressing at supervision level with min verbal cues for safety awareness during transfers. Pt educated on donning sock/shrinker.  Pt completed grooming tasks at w/c level and returned to bed.  Pt remained seated EOB with bed alarm activated and all needs within reach.   Therapy Documentation Precautions:  Precautions Precautions: Fall Precaution Comments: precautions/positioning reviewed with pt Restrictions Weight Bearing Restrictions: Yes RLE Weight Bearing: Non weight bearing Pain:  Pt denies pain this morning   Therapy/Group: Individual Therapy  Leroy Libman 09/30/2018, 8:39 AM

## 2018-09-30 NOTE — Discharge Instructions (Signed)
Inpatient Rehab Discharge Instructions  Curtis Bradford Discharge date and time: No discharge date for patient encounter.   Activities/Precautions/ Functional Status: Activity: activity as tolerated Diet: diabetic diet Wound Care: keep wound clean and dry Functional status:  ___ No restrictions     ___ Walk up steps independently ___ 24/7 supervision/assistance   ___ Walk up steps with assistance ___ Intermittent supervision/assistance  ___ Bathe/dress independently ___ Walk with walker     _x__ Bathe/dress with assistance ___ Walk Independently    ___ Shower independently ___ Walk with assistance    ___ Shower with assistance ___ No alcohol     ___ Return to work/school ________  Special Instructions:  No driving or smoking    COMMUNITY REFERRALS UPON DISCHARGE:    Home Health:   PT, OT, RN  Agency:[facility] Phone:[phone]   Date of last service:10/03/2018  Medical Equipment/Items Ordered:WHEELCHAIR & BEDSIDE COMMODE  Agency/Supplier:[facility]   [phone]   GENERAL COMMUNITY RESOURCES FOR PATIENT/FAMILY: Support Groups:[facility] THE SECOND Thursday OF EACH MONTH @ 7:00-8:30 PM  LOCATED AT [facility] ROOM 1H105 QUESTIONS Lamar Benes [phone]   My questions have been answered and I understand these instructions. I will adhere to these goals and the provided educational materials after my discharge from the hospital.  Patient/Caregiver Signature _______________________________ Date __________  Clinician Signature _______________________________________ Date __________  Please bring this form and your medication list with you to all your follow-up doctor's appointments.

## 2018-09-30 NOTE — Progress Notes (Addendum)
[facility] PHYSICAL MEDICINE & REHABILITATION PROGRESS NOTE   Subjective/Complaints: Patient seen sitting up in his chair working with therapies.  He states he has a hard time donning the stump shrinker.  Discussed donning aid.  ROS: Denies CP, SOB, N/V/D  Objective:   No results found. No results for input(s): WBC, HGB, HCT, PLT in the last 72 hours. Recent Labs    09/29/18 0957  NA 138  K 4.6  CL 104  CO2 25  GLUCOSE 149*  BUN 26*  CREATININE 1.63*  CALCIUM 8.3*    Intake/Output Summary (Last 24 hours) at 09/30/2018 0836 Last data filed at 09/30/2018 0745 Gross per 24 hour  Intake 635 ml  Output 1200 ml  Net -565 ml     Physical Exam: Vital Signs Blood pressure 139/65, pulse 72, temperature 98.2 F (36.8 C), resp. rate 16, SpO2 96 %. Constitutional: NAD.  Vital signs reviewed. HENT: Normocephalic.  Atraumatic. Eyes: EOMI.  No discharge. Cardiovascular: No JVD. Respiratory: CTA bilaterally.  Normal effort. GI: BS +. Non-distended. Musc: Right BKA, with tenderness along edges of incision, improving. Neurologic: Alert and oriented Motor:  5/5 right hip flexion, unchanged Skin: Right BKA with shrinker C/D/I  Assessment/Plan: 1. Functional deficits secondary to RIght BKA which require 3+ hours per day of interdisciplinary therapy in a comprehensive inpatient rehab setting.  Physiatrist is providing close team supervision and 24 hour management of active medical problems listed below.  Physiatrist and rehab team continue to assess barriers to discharge/monitor patient progress toward functional and medical goals  Care Tool:  Bathing    Body parts bathed by patient: Right arm, Left arm, Chest, Face, Abdomen, Front perineal area, Buttocks, Right upper leg, Left upper leg, Left lower leg(shower level)     Body parts n/a: Right lower leg   Bathing assist Assist Level: Supervision/Verbal cueing     Upper Body Dressing/Undressing Upper body dressing   What is  the patient wearing?: Pull over shirt    Upper body assist Assist Level: Independent    Lower Body Dressing/Undressing Lower body dressing      What is the patient wearing?: Underwear/pull up, Pants     Lower body assist Assist for lower body dressing: Supervision/Verbal cueing     Toileting Toileting    Toileting assist Assist for toileting: Contact Guard/Touching assist     Transfers Chair/bed transfer  Transfers assist     Chair/bed transfer assist level: Supervision/Verbal cueing     Locomotion Ambulation   Ambulation assist   Ambulation activity did not occur: Safety/medical concerns  Assist level: Supervision/Verbal cueing Assistive device: Walker-rolling Max distance: 50   Walk 10 feet activity   Assist  Walk 10 feet activity did not occur: Safety/medical concerns  Assist level: Supervision/Verbal cueing Assistive device: Walker-rolling   Walk 50 feet activity   Assist Walk 50 feet with 2 turns activity did not occur: Safety/medical concerns  Assist level: Supervision/Verbal cueing Assistive device: Walker-rolling    Walk 150 feet activity   Assist Walk 150 feet activity did not occur: Safety/medical concerns         Walk 10 feet on uneven surface  activity   Assist Walk 10 feet on uneven surfaces activity did not occur: Safety/medical concerns         Wheelchair     Assist Will patient use wheelchair at discharge?: Yes Type of Wheelchair: Manual    Wheelchair assist level: Supervision/Verbal cueing Max wheelchair distance: 150    Wheelchair 50 feet with  2 turns activity    Assist        Assist Level: Supervision/Verbal cueing   Wheelchair 150 feet activity     Assist     Assist Level: Supervision/Verbal cueing    Medical Problem List and Plan: 1.Decreased functional mobilitysecondary to 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 Antithrombotics: -DVT/anticoagulation:SCD left lower extremity -antiplatelet therapy: aspirin 81 mg daily, Plavix 75 mg daily 3. Pain Management:Neurontin 300 mg 3 times a day, will consider increasing if pain interfering with progress  OxyCR 10mg  qhs helpful for sleep DC'd on 3/31  Oxy IR 5-10mg  q6prn  Controlled on 4/7 4. Mood:Provide emotional support- rec Neuropsych for adjustment -antipsychotic agents: N/A 5. Neuropsych: This patientiscapable of making decisions on hisown behalf. 6. Skin/Wound Care:Routine skin checks  DCed VAC on 4/1 7. Fluids/Electrolytes/Nutrition:Routine ins and outs 8. Diabetes mellitus. Hemoglobin A1c 7.5.  Lantus insulin 30 units daily at bedtime  Tradjenta 5 mg daily. CBG (last 3)  Recent Labs    09/29/18 1649 09/29/18 2057 09/30/18 0626  GLUCAP 183* 104* 233*   Labile on 4/7 9. CAD with stenting. Continue aspirin and Plavix. No chest pain or shortness of breath 10. History of seizures. Keppra 500 mg daily. 11.CKD stage III. Creatinine baseline 1.35-1.80.   Creatinine 1.63 on 4/6  Continue to monitor 12. Hyperlipidemia.Zetia10 mg daily, Lipitor 20 mg daily 13. History of tobacco abuse. Provide counseling 14. Constipation. Laxative assistance 15.  Hypoalbuminemia  Supplement initiated on 3/30 16.  Acute blood loss anemia  Hemoglobin 10.2 on 4/3  Continue to monitor  LOS: 11 days A FACE TO FACE EVALUATION WAS PERFORMED  Dawsyn Ramsaran Karis Juba 09/30/2018, 8:36 AM

## 2018-09-30 NOTE — Progress Notes (Addendum)
Physical Therapy Session Note  Patient Details  Name: Curtis Bradford MRN: [id number] Date of Birth: May 04, 1961  Today's Date: 09/30/2018 PT Individual Time: 0900-1000 PT Individual Time Calculation (min): 60 min   Short Term Goals: Week 2:  PT Short Term Goal 1 (Week 2): STG=LTG due to ELOS   Skilled Therapeutic Interventions/Progress Updates:     Patient in bed upon PT arrival. Patient alert and agreeable to PT session.  Therapeutic Activity: Bed Mobility: Patient performed supine to/from sit independently x2.  Transfers: Patient performed sit to/from stand x4 from the bed, w/c, and a rocking recliner, with CGA-Supervision for safety. Provided verbal cues for reaching back before sitting x2.  Gait Training:  Patient ambulated 65 feet x1 with 2 90 degree turns and 1 180 degree turn and 25 feet x1 using RW with CGA-supervision for safety and balance. Ambulated with hop-to pattern on L, required CGA during turns due to taking larger steps and having decreased control, stepped too close to RW x1 during ambulation, patient self corrected, stated "I stepped too close, I know," and patient's L foot did not clear the floor x1 during second trial without LOB. Provided verbal cues for maintaining focus on ambulation technique, controlling steps, and slowing down during turns.  Wheelchair Mobility:  Patient propelled wheelchair 100 feet with supervision. Patient was able to manage w/c parts, arm rest, leg rest, and limb support, with set-up assist correctly and state why the limb support was necessary when sitting for longer durations appropriately. He managed the w/c breaks appropriately throughout session, requiring cuing x1 before standing.   Therapeutic Exercise: Patient performed the following exercises following the HEP in his CIR notebook with minimal verbal and tactile cues for proper technique. All exercises were performed with 10 reps for 1 set. -B glute sets in supine -B hip adduction  isometrics in supine -R SAQ in supine -L bridging in supine -R hip abduction in side-lying -B hip extension with knee flexion in prone -R LAQ in sitting   Patient sitting EOB, RN aware, at end of session with breaks locked, bed alarm set, and all needs within reach.    Therapy Documentation Precautions:  Precautions Precautions: Fall Precaution Comments: precautions/positioning reviewed with pt Restrictions Weight Bearing Restrictions: Yes RLE Weight Bearing: Non weight bearing Pain: Pain Assessment Pain Scale: 0-10 Pain Score: 2  Pain Type: Chronic pain Pain Location: Leg Pain Orientation: Right Pain Descriptors / Indicators: Aching Pain Frequency: Constant Patients Stated Pain Goal: 0 Pain Intervention(s): Repositioned2/10 pain R residual limb, shooting and throbbing, patient repositioned and distracted during session for pain intervention.   Therapy/Group: Individual Therapy  Helayne Seminole, PT, DPT 09/30/2018, 12:45 PM

## 2018-10-01 ENCOUNTER — Inpatient Hospital Stay (HOSPITAL_COMMUNITY): Payer: Managed Care, Other (non HMO)

## 2018-10-01 ENCOUNTER — Inpatient Hospital Stay (HOSPITAL_COMMUNITY): Payer: Managed Care, Other (non HMO) | Admitting: Physical Therapy

## 2018-10-01 LAB — GLUCOSE, CAPILLARY
Glucose-Capillary: 196 mg/dL — ABNORMAL HIGH (ref 70–99)
Glucose-Capillary: 137 mg/dL — ABNORMAL HIGH (ref 70–99)
Glucose-Capillary: 142 mg/dL — ABNORMAL HIGH (ref 70–99)
Glucose-Capillary: 163 mg/dL — ABNORMAL HIGH (ref 70–99)

## 2018-10-01 MED ORDER — INSULIN GLARGINE 100 UNIT/ML ~~LOC~~ SOLN
5.0000 [IU] | Freq: Every day | SUBCUTANEOUS | Status: DC
  Administered 2018-10-01 – 2018-10-03 (×3): 5 [IU] via SUBCUTANEOUS
  Filled 2018-10-01 (×4): qty 0.05

## 2018-10-01 MED ORDER — INSULIN GLARGINE 100 UNIT/ML ~~LOC~~ SOLN
27.0000 [IU] | Freq: Every day | SUBCUTANEOUS | Status: DC
  Administered 2018-10-01 – 2018-10-02 (×2): 27 [IU] via SUBCUTANEOUS
  Filled 2018-10-01 (×3): qty 0.27

## 2018-10-01 NOTE — Progress Notes (Addendum)
[facility] PHYSICAL MEDICINE & REHABILITATION PROGRESS NOTE   Subjective/Complaints: Patient seen sitting up in a chair working with therapy this morning.  He states he slept well overnight.  He denies complaints.  He states he is now able to don his shrinker.  ROS: Denies CP, SOB, N/V/D  Objective:   No results found. No results for input(s): WBC, HGB, HCT, PLT in the last 72 hours. Recent Labs    09/29/18 0957  NA 138  K 4.6  CL 104  CO2 25  GLUCOSE 149*  BUN 26*  CREATININE 1.63*  CALCIUM 8.3*    Intake/Output Summary (Last 24 hours) at 10/01/2018 0842 Last data filed at 10/01/2018 0715 Gross per 24 hour  Intake 642 ml  Output 2400 ml  Net -1758 ml     Physical Exam: Vital Signs Blood pressure 123/73, pulse 80, temperature 98 F (36.7 C), temperature source Oral, resp. rate 19, SpO2 97 %. Constitutional: NAD.  Vital signs reviewed. HENT: Normocephalic.  Atraumatic. Eyes: EOMI.  No discharge. Cardiovascular: No JVD. Respiratory: CTA bilaterally.  Normal effort. GI: BS +. Non-distended. Musc: Right BKA, with tenderness along edges of incision, improving. Neurologic: Alert and oriented Motor:  5/5 right hip flexion, stable Skin: Right BKA with shrinker C/D/I  Assessment/Plan: 1. Functional deficits secondary to RIght BKA which require 3+ hours per day of interdisciplinary therapy in a comprehensive inpatient rehab setting.  Physiatrist is providing close team supervision and 24 hour management of active medical problems listed below.  Physiatrist and rehab team continue to assess barriers to discharge/monitor patient progress toward functional and medical goals  Care Tool:  Bathing    Body parts bathed by patient: Right arm, Left arm, Chest, Face, Abdomen, Front perineal area, Buttocks, Right upper leg, Left upper leg, Left lower leg     Body parts n/a: Right lower leg   Bathing assist Assist Level: Supervision/Verbal cueing     Upper Body  Dressing/Undressing Upper body dressing   What is the patient wearing?: Pull over shirt    Upper body assist Assist Level: Independent    Lower Body Dressing/Undressing Lower body dressing      What is the patient wearing?: Underwear/pull up, Pants     Lower body assist Assist for lower body dressing: Supervision/Verbal cueing     Toileting Toileting    Toileting assist Assist for toileting: Contact Guard/Touching assist     Transfers Chair/bed transfer  Transfers assist     Chair/bed transfer assist level: Supervision/Verbal cueing     Locomotion Ambulation   Ambulation assist   Ambulation activity did not occur: Safety/medical concerns  Assist level: Supervision/Verbal cueing Assistive device: Walker-rolling Max distance: 60'   Walk 10 feet activity   Assist  Walk 10 feet activity did not occur: Safety/medical concerns  Assist level: Supervision/Verbal cueing Assistive device: Walker-rolling   Walk 50 feet activity   Assist Walk 50 feet with 2 turns activity did not occur: Safety/medical concerns  Assist level: Supervision/Verbal cueing Assistive device: Walker-rolling    Walk 150 feet activity   Assist Walk 150 feet activity did not occur: Safety/medical concerns         Walk 10 feet on uneven surface  activity   Assist Walk 10 feet on uneven surfaces activity did not occur: Safety/medical concerns         Wheelchair     Assist Will patient use wheelchair at discharge?: Yes Type of Wheelchair: Manual    Wheelchair assist level: Supervision/Verbal cueing Max  wheelchair distance: 100'    Wheelchair 50 feet with 2 turns activity    Assist        Assist Level: Supervision/Verbal cueing   Wheelchair 150 feet activity     Assist     Assist Level: Supervision/Verbal cueing    Medical Problem List and Plan: 1.Decreased functional mobilitysecondary to right BKA 09/17/2018 with wound VAC after dehiscence  of 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  2. Antithrombotics: -DVT/anticoagulation:SCD left lower extremity -antiplatelet therapy: aspirin 81 mg daily, Plavix 75 mg daily 3. Pain Management:Neurontin 300 mg 3 times a day, will consider increasing if pain interfering with progress  OxyCR 10mg  qhs helpful for sleep DC'd on 3/31  Oxy IR 5-10mg  q6prn  Controlled on 4/8 4. Mood:Provide emotional support- rec Neuropsych for adjustment -antipsychotic agents: N/A 5. Neuropsych: This patientiscapable of making decisions on hisown behalf. 6. Skin/Wound Care:Routine skin checks  DCed VAC on 4/1 7. Fluids/Electrolytes/Nutrition:Routine ins and outs 8. Diabetes mellitus. Hemoglobin A1c 7.5.  Lantus insulin 30 units daily at bedtime, decreased to 27 on 4/8  Lantus 5 units daily added on 4/8  Tradjenta 5 mg daily. CBG (last 3)  Recent Labs    09/30/18 1627 09/30/18 2115 10/01/18 0659  GLUCAP 174* 113* 196*   Labile on 4/8 9. CAD with stenting. Continue aspirin and Plavix. No chest pain or shortness of breath 10. History of seizures. Keppra 500 mg daily. 11.CKD stage III. Creatinine baseline 1.35-1.80.   Creatinine 1.63 on 4/6  Continue to monitor 12. Hyperlipidemia.Zetia10 mg daily, Lipitor 20 mg daily 13. History of tobacco abuse. Provide counseling 14. Constipation. Laxative assistance 15.  Hypoalbuminemia  Supplement initiated on 3/30 16.  Acute blood loss anemia  Hemoglobin 10.2 on 4/3  Continue to monitor  LOS: 12 days A FACE TO FACE EVALUATION WAS PERFORMED  Curtis Bradford 10/01/2018, 8:42 AM

## 2018-10-01 NOTE — Progress Notes (Signed)
Physical Therapy Session Note  Patient Details  Name: Curtis Bradford MRN: [id number] Date of Birth: 1961-03-23  Today's Date: 10/01/2018 PT Individual Time: 0915-0955 PT Individual Time Calculation (min): 40 min   Short Term Goals: Week 2:  PT Short Term Goal 1 (Week 2): STG=LTG due to ELOS   Skilled Therapeutic Interventions/Progress Updates:    Patient received in bed, very pleasant and willing to participate with PT today. Able to perform bed mobility with full independence, functional transfers with S and RW, and gait approximately 17f with RW and S during session today, noted improved safety and sequencing even when patient talking to therapist and nursing staff during gait period. Also worked on functional exercises appropriate for amputee including quad sets, SLRs, single leg bridges with non-surgical LE, prone on elbows stretch for hip flexors, prone hip extension,and sidelying hip abduction. Education provided on benefit/progression of shrinker and relation to progressing to prosthetic as well as management strategies for phantom pain. He was able to independently navigate WC back to his room, transferred back to bed with RW and S, and was left sitting EOB with all needs met and bed alarm active this morning.   Therapy Documentation Precautions:  Precautions Precautions: Fall Precaution Comments: precautions/positioning reviewed with pt Restrictions Weight Bearing Restrictions: Yes RLE Weight Bearing: (RBKA) General:   Vital Signs:   Pain: Pain Assessment Pain Scale: 0-10 Pain Score: 0-No pain Faces Pain Scale: Hurts little more    Therapy/Group: Individual Therapy   KDeniece ReePT, DPT, CBIS  Supplemental Physical Therapist [facility]   Pager [phone] Rehab Office [phone]  10/01/2018, 12:42 PM

## 2018-10-01 NOTE — Patient Care Conference (Addendum)
Inpatient RehabilitationTeam Conference and Plan of Care Update Date: 10/01/2018   Time: 2:15 PM    Patient Name: Curtis Bradford      Medical Record Number: [id number]  Date of Birth: August 31, 1960 Sex: Male         Room/Bed: 4M05C/4M05C-01 Payor Info: Payor: [employer] / Plan: Optician, dispensing / Product Type: *No Product type* /    Admitting Diagnosis: BKA  Admit Date/Time:  09/19/2018  3:47 PM Admission Comments: No comment available   Primary Diagnosis:  <principal problem not specified> Principal Problem: <principal problem not specified>  Patient Active Problem List   Diagnosis Date Noted  . Intermittent pain   . Phantom limb pain (HCC)   . CKD (chronic kidney disease), stage III (HCC)   . Labile blood glucose   . Acute blood loss anemia   . Hypoalbuminemia due to protein-calorie malnutrition (HCC)   . Diabetes mellitus type 2 in nonobese (HCC)   . Postoperative pain   . Neuropathic pain   . Seizures (HCC)   . Right below-knee amputee (HCC) 09/19/2018  . Below knee amputation (HCC) 09/17/2018  . Gangrene of right foot (HCC)   . Status post transmetatarsal amputation of foot, right (HCC) 07/23/2018  . Osteomyelitis (HCC) 07/23/2018  . TIA (transient ischemic attack) 10/30/2017  . Medication monitoring encounter 03/28/2017  . Status post amputation of toe of right foot (HCC) 03/05/2017  . Subacute osteomyelitis, right ankle and foot (HCC)   . Peripheral neuropathy 02/16/2017  . Peripheral artery disease (HCC) 02/16/2017  . 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 Diabetic foot infection (HCC) 02/14/2017  . Type II diabetes mellitus with renal manifestations (HCC)   . HLD (hyperlipidemia)   . Tobacco abuse   . CKD stage 2 due to type 2 diabetes mellitus (HCC)   . CAD (coronary artery disease)   . Seizure disorder [facility])     Expected Discharge Date: Expected Discharge Date: 10/03/18  Team Members Present: Physician leading conference: Dr. Maryla Morrow Social Worker Present: Dossie Der, LCSW Nurse Present: Chana Bode, RN;Elena Laverda Sorenson, RN PT Present: Wanda Plump, PT OT Present: Ardis Rowan, COTA SLP Present: Jackalyn Lombard, SLP PPS Coordinator present : Fae Pippin     Current Status/Progress Goal Weekly Team Focus  Medical   Decreased functional mobility secondary to right BKA 09/17/2018 with wound VAC after dehiscence of TMA amputation January 2020  Improve CKD, pain, DM  See above   Bowel/Bladder   Continent of bladder/bowel. last BM 09/30/18     Address and monitor toileting QS and PRN,    Swallow/Nutrition/ Hydration             ADL's   supervision overall at w/ level for bathing/dressing; decreased safety awareness requring min verbal cues for safety  mod I overall  discharge planning, safety awarness, educatoin, standing balance   Mobility   Supervision basic transfers, gait up to 62' with RW, and w/c propulsion 150'  Mod I transfers, w/c x 150', gait x 25'  pt ed, safety, transfers, gait training, w/c propulsion, HEP   Communication             Safety/Cognition/ Behavioral Observations            Pain        pain managed by medications     Skin        healing BKA shrinker on now        *See Care Plan and progress  notes for long and short-term goals.     Barriers to Discharge  Current Status/Progress Possible Resolutions Date Resolved   Physician    Medical stability;Wound Care;Weight bearing restrictions;Decreased caregiver support;Home environment access/layout     See above  Therapies, follow labs, optimize DM meds      Nursing                  PT  Decreased caregiver support;Wound Care  History of non-healing amputation incisions, intermittent caregiver support upon d/c  Pt reports that he will have someone move the boxes in his home to one of the spare bedrooms before d/c on Friday.            OT                  SLP                SW                Discharge Planning/Teaching Needs:   Getting close to reaching goals butr needed two extra days to reach them, to be able to be home alone and be safe with intermittent assist from friends.      Team Discussion:  Progressing toward his mod/i wheelchair level goals. MD adjusting DM meds. Needs cues for safety at times. Will be high risk to fall at home due to distracted which he is aware of. Intermittent assist from friends.   Revisions to Treatment Plan:  DC 4/10    Continued Need for Acute Rehabilitation Level of Care: The patient requires daily medical management by a physician with specialized training in physical medicine and rehabilitation for the following conditions: Daily direction of a multidisciplinary physical rehabilitation program to ensure safe treatment while eliciting the highest outcome that is of practical value to the patient.: Yes Daily medical management of patient stability for increased activity during participation in an intensive rehabilitation regime.: Yes Daily analysis of laboratory values and/or radiology reports with any subsequent need for medication adjustment of medical intervention for : Post surgical problems;Wound care problems;Diabetes problems   I attest that I was present, lead the team conference, and concur with the assessment and plan of the team. Teleconference held due to COVID-19   Yomar Mejorado, Lemar LivingsRebecca G 10/01/2018, 2:15 PM

## 2018-10-01 NOTE — Progress Notes (Signed)
Social Work Patient ID: Curtis Bradford, male   DOB: 04-16-1961, 58 y.o.   MRN: [id number] Met with pt to discuss team conference and plan for Friday discharge. Have ordered equipment and home health arranged via [facility]> he is looking forward to discharge Friday. Will have mom transport home and nephew help move boxes so wheelchair will fit through the doorways

## 2018-10-01 NOTE — Progress Notes (Signed)
Physical Therapy Session Note  Patient Details  Name: Curtis Bradford MRN: [id number] Date of Birth: 06/04/1961  Today's Date: 10/01/2018 PT Individual Time: 1205-1235 and 1330-1400 PT Individual Time Calculation (min): 30 min and 30 min  Short Term Goals:  Week 2:  PT Short Term Goal 1 (Week 2): STG=LTG due to ELOS   Skilled Therapeutic Interventions/Progress Updates:   tx 1: Pt resting in bed.  He used urinal in sitting independently.  Pt stated that he sleeps on his abdomen often, so thinks this will be helpful.  PT reinforced that pt needs to stay in prone x 15 minutes x 2, QD.  Discussed safety re: w/c brakes and poor eccentric control stand> sit.  Pt states that he feels overconfident when in the w/c.  PT explained rationale behind being safe and slow for transfers to prevent injury and LOB.    Squat pivot bed> w/c with supervision and extra time to sequence and manage w/c parts.  W/c propulsion on unit, supervision.   Pt stood x 3 minutes during bil UE task of composing a PVC puzzle via picture, accurately.  He leaned abdomen against table for balance with cues.  W/c> bed squat pivot with supervision.  Pt left sitting EOB with bed alarm set.  Pt needed cues to lock w/c brakes before moving footrests away. Pt continues to distract himself by talking continuously during therapy.    tx 2:  Pt resting in bed.  Squat pivot bed> w/c to R supervision.  W/c propulsion on level tile, forgetting to move R amputee pad around and place his residual limb on it.  Pt  needed mod cues to recognize that this is a safety risk when propelling w/c.    Simulated car transfer to SUV height with stand pivot, RW, supervision.  No cues needed for safety.  PT demonstrated how Rw and wc fold.  His mother will not be able to move wc in/out of back of her SUV.  Pt stated that his nephew will remove the w/c from the SUV when he gets home.  His family was going today to move boxes in his apt to allow room  for the w/c.  W/c propulsion up/down ramp x 2 with close supervision, cues for ensuring that casters are straight as he approaches transition in flooring.  Pt left resting at EOB with alarm set, and needs at hand.     Therapy Documentation Precautions:  Precautions Precautions: Fall Precaution Comments: precautions/positioning reviewed with pt Restrictions Weight Bearing Restrictions: Yes RLE Weight Bearing: (RBKA)   Pain: tx 1: pt denies; premedicated tx 2: pt denies, premedicated      Therapy/Group: Individual Therapy  Mikkel Charrette 10/01/2018, 12:44 PM

## 2018-10-01 NOTE — Progress Notes (Signed)
Occupational Therapy Session Note  Patient Details  Name: Curtis Bradford MRN: [id number] Date of Birth: [birth year]  Today's Date: 10/01/2018 OT Individual Time: 4235-3614 OT Individual Time Calculation (min): 55 min    Short Term Goals: Week 2:  OT Short Term Goal 1 (Week 2): STG=LTG 2/2 ELOS  Skilled Therapeutic Interventions/Progress Updates:    OT intervention with focus on functional transfers, functional amb with RW, education, w/c mobility, discharge planning, activity tolerance, sit<>stand, standing balance, and safety awareness to increase independence with BADL. Pt educated on use of RW bag when using RW at home.  Pt educated to not carry items in hands when using RW.  Pt verbalized understanding.  Reemphasized improtance of locking w/c brakes when stationary.  Pt verbalized understanding.  Pt issued Amput[employer] Support [employer] brochure and Pain/Sensation handout.  Pt practiced w/c mobility in simulated home environment. Pt returned to room and amb with RW to bed.  Pt remained in bed with all needs within reach and bed alarm activated.   Therapy Documentation Precautions:  Precautions Precautions: Fall Precaution Comments: precautions/positioning reviewed with pt Restrictions Weight Bearing Restrictions: Yes RLE Weight Bearing: (RBKA)   Pain: Pain Assessment Pain Scale: 0-10 Pain Score: 7 phantom pain RLE; RN aware, education and handout provided    Therapy/Group: Individual Therapy  Curtis Bradford 10/01/2018, 11:40 AM

## 2018-10-01 NOTE — Progress Notes (Signed)
Occupational Therapy Session Note  Patient Details  Name: Curtis Bradford MRN: [id number] Date of Birth: 1960-07-15  Today's Date: 10/01/2018 OT Individual Time: 1975-8832 OT Individual Time Calculation (min): 75 min    Short Term Goals: Week 2:  OT Short Term Goal 1 (Week 2): STG=LTG 2/2 ELOS  Skilled Therapeutic Interventions/Progress Updates:    OT intervention with focus on funcitonal amb with RW, w/c mobility, safety awareness, functional tranfsers, BADL training, discharge planning, and activity tolerance to increase independence with BADLs.  Pt completed all tasks including funcitonal tranfsers and functional amb with RW at supervision/mod I level.  Pt did required 2 verbal cues to lock brakes on w/c when performing sit<>stand and tranfsers.  Pt upset that his mom and sister-in-law are going to his apt to arrange and unpack boxes.  Emotional support provided.  Pt remained in bed with all needs within reach and bed alarm activated.   Therapy Documentation Precautions:  Precautions Precautions: Fall Precaution Comments: precautions/positioning reviewed with pt Restrictions Weight Bearing Restrictions: Yes RLE Weight Bearing: Non weight bearing Pain: Pt c/o 4/10 pain in LLE; RN aware and meds admin at end of session   Therapy/Group: Individual Therapy  Rich Brave 10/01/2018, 9:26 AM

## 2018-10-02 ENCOUNTER — Inpatient Hospital Stay (INDEPENDENT_AMBULATORY_CARE_PROVIDER_SITE_OTHER): Payer: Managed Care, Other (non HMO) | Admitting: Orthopedic Surgery

## 2018-10-02 ENCOUNTER — Inpatient Hospital Stay (HOSPITAL_COMMUNITY): Payer: Managed Care, Other (non HMO)

## 2018-10-02 LAB — GLUCOSE, CAPILLARY
Glucose-Capillary: 242 mg/dL — ABNORMAL HIGH (ref 70–99)
Glucose-Capillary: 167 mg/dL — ABNORMAL HIGH (ref 70–99)
Glucose-Capillary: 110 mg/dL — ABNORMAL HIGH (ref 70–99)
Glucose-Capillary: 151 mg/dL — ABNORMAL HIGH (ref 70–99)

## 2018-10-02 MED ORDER — ACETAMINOPHEN 325 MG PO TABS: 325.0000 mg | ORAL_TABLET | Freq: Four times a day (QID) | ORAL | Status: AC | PRN

## 2018-10-02 MED ORDER — GABAPENTIN 300 MG PO CAPS: 300.0000 mg | ORAL_CAPSULE | Freq: Three times a day (TID) | ORAL | 0 refills | Status: DC

## 2018-10-02 MED ORDER — EZETIMIBE 10 MG PO TABS: 10.0000 mg | ORAL_TABLET | Freq: Every day | ORAL | 3 refills | Status: DC

## 2018-10-02 MED ORDER — OXYCODONE HCL 5 MG PO TABS: 5.0000 mg | ORAL_TABLET | Freq: Four times a day (QID) | ORAL | 0 refills | Status: DC | PRN

## 2018-10-02 MED ORDER — OXYCODONE HCL 5 MG PO TABS: 5.0000 mg | ORAL_TABLET | ORAL | 0 refills | Status: DC | PRN

## 2018-10-02 MED ORDER — INSULIN GLARGINE 100 UNIT/ML SOLOSTAR PEN: 27.0000 [IU] | PEN_INJECTOR | Freq: Every day | SUBCUTANEOUS | 11 refills | Status: DC

## 2018-10-02 MED ORDER — OXYCODONE HCL 5 MG PO TABS
5.0000 mg | ORAL_TABLET | Freq: Four times a day (QID) | ORAL | Status: DC | PRN
  Administered 2018-10-02 – 2018-10-03 (×2): 5 mg via ORAL
  Filled 2018-10-02 (×2): qty 1

## 2018-10-02 MED ORDER — METHOCARBAMOL 500 MG PO TABS: 500.0000 mg | ORAL_TABLET | Freq: Four times a day (QID) | ORAL | 0 refills | Status: DC | PRN

## 2018-10-02 MED ORDER — LEVETIRACETAM ER 500 MG PO TB24: 500.0000 mg | ORAL_TABLET | Freq: Every day | ORAL | 3 refills | Status: DC

## 2018-10-02 MED ORDER — INSULIN GLARGINE 100 UNIT/ML SOLOSTAR PEN: 5.0000 [IU] | PEN_INJECTOR | Freq: Every day | SUBCUTANEOUS | 11 refills | Status: DC

## 2018-10-02 MED ORDER — LINAGLIPTIN 5 MG PO TABS: 5.0000 mg | ORAL_TABLET | Freq: Every day | ORAL | 3 refills | Status: DC

## 2018-10-02 MED ORDER — ATORVASTATIN CALCIUM 20 MG PO TABS: 20.0000 mg | ORAL_TABLET | Freq: Every day | ORAL | 3 refills | Status: DC

## 2018-10-02 MED ORDER — CLOPIDOGREL BISULFATE 75 MG PO TABS: 75.0000 mg | ORAL_TABLET | Freq: Every day | ORAL | 4 refills | Status: DC

## 2018-10-02 MED ORDER — LINAGLIPTIN 5 MG PO TABS: 5.0000 mg | ORAL_TABLET | Freq: Every day | ORAL | 0 refills | Status: DC

## 2018-10-02 NOTE — Progress Notes (Signed)
Occupational Therapy Discharge Summary  Patient Details  Name: Curtis Bradford MRN: [id number] Date of Birth: 05-29-1961  Patient has met 12 of 12 long term goals due to improved activity tolerance, improved balance and ability to compensate for deficits.  Pt made steady progress with BADLs and IADLs during this admission and is discharging home at mod I/I level.  Pt performs squat pivot transfers at mod I level.  Pt has demonstrated safe behaviors in kitchen during simulated kitchen tasks at w/c level and standing at counter to access microwave at home.  Pt performs tub bench transfers and toilet transfers at mod I level.  Pt lives in handicap accessible apartment with grab bars in tub and beside toilet.  Patient to discharge at overall Modified Independent level.  Patient does not have an active care partner to A, but does have friends in the community to A with errands.  Recommendation:  Patient will benefit from ongoing skilled OT services in home health setting to continue to advance functional skills in the area of BADL and iADL.  Equipment: BSC, pt purchased tub tranfser bench  Reasons for discharge: treatment goals met  Patient/family agrees with progress made and goals achieved: Yes  OT Discharge Vision Baseline Vision/History: No visual deficits Patient Visual Report: No change from baseline Perception  Perception: Within Functional Limits Praxis Praxis: Intact Cognition Overall Cognitive Status: Within Functional Limits for tasks assessed Arousal/Alertness: Awake/alert Orientation Level: Oriented X4 Attention: Sustained;Selective Sustained Attention: Appears intact Selective Attention: Appears intact Memory: Appears intact Awareness: Appears intact Problem Solving: Appears intact Behaviors: Impulsive Safety/Judgment: Appears intact Sensation Sensation Light Touch: Appears Intact Coordination Gross Motor Movements are Fluid and Coordinated: Yes Fine Motor Movements  are Fluid and Coordinated: Yes Motor  Motor Motor: Within Functional Limits   Trunk/Postural Assessment  Cervical Assessment Cervical Assessment: Within Functional Limits Thoracic Assessment Thoracic Assessment: Within Functional Limits Lumbar Assessment Lumbar Assessment: Within Functional Limits Postural Control Postural Control: Within Functional Limits  Balance Static Sitting Balance Static Sitting - Balance Support: Feet supported;No upper extremity supported Static Sitting - Level of Assistance: 7: Independent Dynamic Sitting Balance Dynamic Sitting - Balance Support: No upper extremity supported;During functional activity Dynamic Sitting - Level of Assistance: 7: Independent Extremity/Trunk Assessment RUE Assessment RUE Assessment: Within Functional Limits LUE Assessment LUE Assessment: Within Functional Limits   Leroy Libman 10/02/2018, 6:42 AM

## 2018-10-02 NOTE — Discharge Summary (Addendum)
Physician Discharge Summary  Patient ID: Curtis Bradford MRN: [id number] DOB/AGE: October 06, 1960 58 y.o.  Admit date: 09/19/2018 Discharge date: 10/03/2018  Discharge Diagnoses:  Active Problems:   Right below-knee amputee (HCC)   Acute blood loss anemia   Hypoalbuminemia due to protein-calorie malnutrition (HCC)   Diabetes mellitus type 2 in nonobese (HCC)   Postoperative pain   Neuropathic pain   Seizures (HCC)   Labile blood glucose   CKD (chronic kidney disease), stage III (HCC)   Intermittent pain   Phantom limb pain (HCC)   Discharged Condition: stable  Significant Diagnostic Studies: Xr Foot Complete Right  Result Date: 09/05/2018 Radiographs of the right foot show the patient to be status post transmetatarsal amputation without evidence of osteomyelitis over the residual metatarsals or elsewhere.  No other osseous abnormalities are noted.   Labs:  Basic Metabolic Panel: Recent Labs  Lab 09/29/18 0957  NA 138  K 4.6  CL 104  CO2 25  GLUCOSE 149*  BUN 26*  CREATININE 1.63*  CALCIUM 8.3*    CBC: Recent Labs  Lab 09/26/18 0513  WBC 10.1  NEUTROABS 6.5  HGB 10.2*  HCT 32.0*  MCV 90.9  PLT 460*    CBG: Recent Labs  Lab 09/30/18 2115 10/01/18 0659 10/01/18 1117 10/01/18 1640 10/01/18 2133  GLUCAP 113* 196* 137* 142* 163*   Family history. Mother and father with both diabetes mellitus type 2 as well as hypertension. Denies cancer  Brief HPI:    Curtis Bradford is a 58 year old right-handed male with history of CAD with stenting maintained on aspirin and Plavix, CKD stage III , diabetes mellitus, hypertension, CVA 2012, tobacco abuse, seizure disorder maintained on Keppra. Patient with right transmetatarsal amputation January 2020 discharge to home with home health therapies. Per chart review patient lives alone one level apartment. Friends check on him routinely. Presented 09/17/2018 with progressive dehiscence ischemic changes of recent right TMA  amputation. 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 Lajoyce Cornersuda with application of wound VAC. Hospital course pain management. Patient remained on aspirin and Plavix as prior to admission. Therapy evaluations completed and patient was admitted for a comprehensive rehabilitation program  Hospital Course: Curtis DimesJohn Berthold was admitted to rehab 09/19/2018 for inpatient therapies to consist of PT, ST and OT at least three hours five days a week. Past admission physiatrist, therapy team and rehab RN have worked together to provide customized collaborative inpatient rehab. Pertaining to [name] right BKA wound VAC was discontinued. Stump shrinker was ordered. SCDs for DVT prophylaxis of left lower extremity. Patient remained on aspirin and Plavix as prior to admission for history of CAD with stenting. Pain management with the use of oxycodone tapered to 5 mg every 6 hours as needed, Robaxin and Neurontin that was scheduled 3 times a day. Blood sugars monitored daily hemoglobin A1c 7.5 Lantus insulin is indicated as well as Lithuaniaradenta. Full diabetic teaching was completed. He would follow up with his primary M.D.CKDstage III creatinine baseline 1.35-1.80 with latest creatinine 1.63. He remained on zetia as well as Lipitor for hyperlipidemia. History of seizure disorder remained on Keppra.He did have a history of tobacco abuse receiving full counseling in regard to cessation of nicotine products it was questionable if he would be compliant with these requests. Acute blood loss anemia after recent amputation latest hemoglobin 10.2 no shortness of breath no dizziness no chest pain reported.  Physical exam. Blood pressure 133/70 pulse 90 11/11/1996 respirations 18 oxygen saturation 95% room air Constitutional. Alert and oriented. Well-developed. HEENT.  Head. Normocephalic and atraumatic Eyes. Round and reactive to light EOMs normal no discharge without nystagmus Neck supple nontender normal range of motion of thyromegaly without  JVD Cardiovascular. Normal rate and rhythm and no friction rub no murmur heard Respiratory. Effort normal no respiratory distress without wheezes GI. Soft exhibits no distention nontender without rebound Musculoskeletal. Right leg with expected edema tender to palpation Neurological. Alert and oriented upper extremity 5 out of 5 left lower extremity 4 out of 5 proximal to distal he was able to live right lower extremity off the bed. BKA site dressed wound VAC was in place initially  Rehab course: During patient's stay in rehab weekly team conferences were held to monitor patient's progress, set goals and discuss barriers to discharge. At admission, patient required moderate assist sit to stand, min mod assist to ambulate 10 feet rolling walker, supervision sit to supine. Independent upper body bathing minimal guard lower body bathing, minimal guard lower body dressing, moderate assist to transfers.   He  has had improvement in activity tolerance, balance, postural control as well as ability to compensate for deficits. He/She has had improvement in functional use RUE/LUE  and RLE/LLE as well as improvement in awareness.patient was able to perform bed mobility with full independence. Functional transfers with supervision rolling walker, gait approximately 80 feet rolling walker supervision. Noted improved safety and sequencing. Also worked on functional exercises appropriate for a PT including quad sets. He was able to independently navigate wheelchair back to his room transferred back to bed rolling walker supervision. ADLs educated use a rolling walker reemphasized importance of locking wheelchair brakes when stationary. He was able to gather his belongings complete his regular daily ADLs. Family teaching completed and plan discharge to home.       Disposition:  discharge to home   Diet: diabetic diet  Special Instructions: No smoking drinking or alcohol. No driving  Medications at  discharge. 1. Aspirin 81 mg by mouth daily 2. Lipitor 20 mg by mouth daily 3. Plavix 75 mg by mouth daily at bedtime 4. Zetia 10 mg daily 5. Neurontin 300 mg by mouth 3 times a day  6. Lantus insulin 5 units daily and 27 units daily at bedtime 7. Keppra 500 mg by mouth daily 8.tradjenta 5 mg by mouth daily 9. Robaxin 500 mg every 6 hours as needed muscle spasms 10. Oxycodone 5 mg every 6 hours as needed pain  Discharge Instructions    Ambulatory referral to Physical Medicine Rehab   Complete by:  As directed    Complexity follow-up 1-2 weeks right BKA      Follow-up Information    Marcello Fennel, MD Follow up.   Specialty:  Physical Medicine and Rehabilitation Why:  office to call for appointment Contact information: [address] [place] [address] [place] [address] [phone]        [name], MD Follow up.   Specialty:  Orthopedic Surgery Why:  call for appointment 2 weeks Contact information: [address] [place] [address] [phone]           Signed: Charlton Amor 10/02/2018, 5:20 AM Patient seen and examined by me on day of discharge. Maryla Morrow, MD, ABPMR

## 2018-10-02 NOTE — Progress Notes (Signed)
Physical Therapy Discharge Summary  Patient Details  Name: Curtis Bradford MRN: [id number] Date of Birth: 1960-12-18  Today's Date: 10/02/2018 PT Individual [phone],  1330-1450 PT Individual Time Calculation (min): 30 and 60  min    Patient has met 10 of 12 long term goals due to improved activity tolerance, improved balance, increased strength, decreased pain, ability to compensate for deficits, functional use of  right upper extremity, left upper extremity and left lower extremity and improved attention.  Patient to discharge at an ambulatory level Modified Independent at home, supervision in other settings due to pt's distractibility.   Patient did not have a care partner come in for training before d/c.  Pt is reluctant to ask anyone for help.  His mother has mobility issues, but he does have a nephew who will help him move boxes in his apartment (which have never been unpacked).   Reasons goals not met: distractibility; pt is verbose  Recommendation:  Patient will benefit from ongoing skilled PT services in home health setting to continue to advance safe functional mobility, address ongoing impairments in safety, gait in distracting environments, balance, amputee ed, and minimize fall risk.  Equipment: RW, w/c cushion, w/c with amputee axle and amputee pad  Reasons for discharge: discharge from hospital  Patient/family agrees with progress made and goals achieved: Yes  PT Discharge tx 1: pain 2/10 throbbing; premedicated  Pt sitting EOB.  Bed> w/c using RW, supervision.  W/c propulsion on unit independent.  Extra time to manage legrests, and forgot to lock brakes first.  Floor transfer modified independent.  Discussed use of 911 for fall recovery and pt verbalized rationale.  W/c> bed squat pivot with supervision, safely.  Discussed 2 exercises of HEP: pt to perform 15 x 1 each bil glut sets and r quad set before next PT session. Pt left resting in bed with needs at hand and alarm  set.  tx 2:  Pt on toilet, having finished using toilet supervised by NSG.  Stand pivot toilet > w/c modified independent.  Hand hygiene in sitting, independent.  Pain 2/10 residual limb, distal end at incision; premedicated.  Gait training on carpet in home setting modified independent (no distractions) with RW x 25'.  Pt education about S/S of CVA as he has had 3 ; PT recommending use of 911 given pt's hx.  DME provider arrived with w/c and cushion.  Cushion is not deep enough for w/c; PT consulted with CSW.  W/c and RW adjusted to fit pt.  Squat pivot w/c>< bed, modified independent, safely.  In prone, pt used HEP hand-out to perform 15 x 1 R/L knee flex/hip extensions.  Pt left resting on bed with alarm set and needs at hand. Precautions/Restrictions Restrictions Weight Bearing Restrictions: Yes RLE Weight Bearing: Non weight bearing Vital Signs Therapy Vitals Pulse Rate: 88 Resp: 18 BP: (!) 166/89 Patient Position (if appropriate): Sitting Oxygen Therapy SpO2: 98 % O2 Device: Room Air Pain Pain Assessment Pain Score: 3  Pain Type: Surgical pain Pain Intervention(s): Medication (See eMAR) Vision/Perception  No visual deficits; no change from baseline    Cognition Overall Cognitive Status: Within Functional Limits for tasks assessed Arousal/Alertness: Awake/alert Orientation Level: Oriented X4 Attention: Sustained;Selective Sustained Attention: Appears intact Selective Attention: Appears intact Memory: Appears intact Awareness: Appears intact Problem Solving: Appears intact Behaviors: Impulsive Safety/Judgment: Appears intact Sensation Sensation Light Touch: Appears Intact(hypersensitive at residual limb incision site) Proprioception Impaired Details: Impaired RUE Coordination Gross Motor Movements are Fluid and Coordinated: Yes Fine  Motor Movements are Fluid and Coordinated: Yes Coordination and Movement Description: impaired 2/2 recent R BKA Motor  Motor Motor:  (P) Within Functional Limits  Mobility Bed Mobility Bed Mobility: Rolling Right;Rolling Left;Supine to Sit;Sit to Supine Rolling Right: Independent Rolling Left: Independent Supine to Sit: Independent Sit to Supine: Supervision/Verbal cueing Transfers Transfers: Sit to Stand;Stand to Sit;Stand Pivot Transfers Sit to Stand: Minimal Assistance - Patient > 75% Stand to Sit: Independent Stand Pivot Transfers: Independent Transfer (Assistive device): Rolling walker Locomotion  Gait Ambulation: Yes Gait Assistance: Supervision/Verbal cueing Gait Distance (Feet): 50 Feet(modified independent for 25' in non-distracting env) Assistive device: Rolling walker Gait Assistance Details: Verbal cues for safe use of DME/AE;Verbal cues for precautions/safety Gait Assistance Details: pt places foot too far foward within RW , at times; when fatigued, pt sometimes does not clear L foot (dropped midfoot) well, but no LOB Gait Gait: Yes Gait Pattern: Impaired Gait Pattern: (impaired 2/2 R BKA) Gait velocity: decreased Stairs / Additional Locomotion Stairs: No Wheelchair Mobility Wheelchair Mobility: Yes Wheelchair Assistance: Independent with Camera operator: Both upper extremities Wheelchair Parts Management: Independent Distance: 150  Trunk/Postural Assessment  Cervical Assessment Cervical Assessment: Within Functional Limits Thoracic Assessment Thoracic Assessment: Within Functional Limits Lumbar Assessment Lumbar Assessment: Within Functional Limits Postural Control Postural Control: Within Functional Limits  Balance Balance Balance Assessed: Yes Static Sitting Balance Static Sitting - Balance Support: Feet supported;No upper extremity supported Static Sitting - Level of Assistance: 7: Independent Dynamic Sitting Balance Dynamic Sitting - Balance Support: No upper extremity supported;During functional activity Dynamic Sitting - Level of Assistance: 7:  Independent Static Standing Balance Static Standing - Balance Support: Right upper extremity supported Static Standing - Level of Assistance: 5: Stand by assistance Dynamic Standing Balance Dynamic Standing - Balance Support: During functional activity(abdomen leaning against sturdy table) Dynamic Standing - Balance Activities: Other (comment)(PVC puzzle, using bil hands at times) Extremity Assessment      RLE Assessment RLE Assessment: Exceptions to [facility] General Strength Comments: grossly in sitting: hip flex/abduction, adduction 4+/5, knee extension at least 3+/5 - limited resistance given LLE Assessment LLE Assessment: Within Functional Limits    Ritter Helsley 10/02/2018, 3:43 PM

## 2018-10-02 NOTE — Progress Notes (Signed)
Occupational Therapy Session Note  Patient Details  Name: Curtis Bradford MRN: [id number] Date of Birth: January 19, 1961  Today's Date: 10/02/2018 OT Individual Time: 8242-3536 OT Individual Time Calculation (min): 75 min    Short Term Goals: Week 2:  OT Short Term Goal 1 (Week 2): STG=LTG 2/2 ELOS  Skilled Therapeutic Interventions/Progress Updates:    OT intervention with focus on ongoing discharge planning, home safety, BADLs, functional amb with RW, safety awareness, and activity tolerance.  Pt amb with RW in room to access bathroom and sink.  Pt completed all tranfsers and BADLs at mod I/I level.  Pt did required one verbal cues to lock brakes on w/c when fastening shoes. Pt pleased with progress and ready for discharge tomorrow.  Therapy Documentation Precautions:  Precautions Precautions: Fall Precaution Comments: precautions/positioning reviewed with pt Restrictions Weight Bearing Restrictions: Yes RLE Weight Bearing: (RT BKA) Pain:  Pt denies pain   Therapy/Group: Individual Therapy  Rich Brave 10/02/2018, 8:19 AM

## 2018-10-02 NOTE — Progress Notes (Signed)
Occupational Therapy Session Note  Patient Details  Name: Curtis Bradford MRN: [id number] Date of Birth: 1960/11/02  Today's Date: 10/02/2018 OT Individual Time: 1030-1130 OT Individual Time Calculation (min): 60 min    Short Term Goals: Week 1:  OT Short Term Goal 1 (Week 1): Pt will complete bathing sit to stand with supervision. OT Short Term Goal 1 - Progress (Week 1): Met OT Short Term Goal 2 (Week 1): Pt will complete LB dressing sit to stand with supervision. OT Short Term Goal 2 - Progress (Week 1): Met OT Short Term Goal 3 (Week 1): Pt will complete toilet transfer supervision using the RW. OT Short Term Goal 3 - Progress (Week 1): Met OT Short Term Goal 4 (Week 1): Pt complete tub shower transfer with supervision using the RW and tub bench.   OT Short Term Goal 4 - Progress (Week 1): Met Week 2:  OT Short Term Goal 1 (Week 2): STG=LTG 2/2 ELOS  Skilled Therapeutic Interventions/Progress Updates:    OT intrevention with focus on tub bench transfers, toilet tranfsers, toileting, functional amb with RW, standing balance, continued discharge planning, home safety, general safety awareness, and activity tolerance to assure safe discharge home tomorrow.  Pt amb with RW in ADL apartment to gather items for safe transport.  Pt practiced furniture transfers.  During one transfer, pt set up w/c in a manner requring pt to make a 180 degree turn to sit in w/c.  Discussed safety concerns with pt regarding w/c setup.  Pt verbalized understanding and subsequent transfers were safer. Pt returned to room and transferred to bed with squat pivot transfer.  Pt remained in bed with all needs within reach and bed alarm activated.  Pt pleased with progress and ready for discharge tomorrow.   Therapy Documentation Precautions:  Precautions Precautions: Fall Precaution Comments: precautions/positioning reviewed with pt Restrictions Weight Bearing Restrictions: Yes RLE Weight Bearing: (RT BKA)   Pain:  Pt denies pain this morning    Therapy/Group: Individual Therapy  Leroy Libman 10/02/2018, 12:56 PM

## 2018-10-02 NOTE — Progress Notes (Signed)
Social Work  Discharge Note  The overall goal for the admission was met for:   Discharge location: Yes-HOME WITH INTERMITTENT ASSIST FROM FRIENDS  Length of Stay: Yes-13 DAYS  Discharge activity level: Yes-MOD/I [place]  Home/community participation: Yes  Services provided included: MD, RD, PT, OT, RN, CM, Pharmacy, Neuropsych and SW  Financial Services: Private Insurance: [place]  Follow-up services arranged: Home Health: [facility]-PT, OT, RN, DME: [facility]-WHEELCHAIR & 3 IN 1 and Patient/Family has no preference for HH/DME agencies  Comments (or additional information):PT REACHED MOD/I WHEELCHAIR LEVEL. STILL AT RISK TO FALL DUE TO SOME UNSAFE BEHAVIORS WITH MOVING TOO FAST AND DISTRACTIBILITY. HE IS AWARE OF THIS ISSUE  Patient/Family verbalized understanding of follow-up arrangements: Yes  Individual responsible for coordination of the follow-up plan: SELF  Confirmed correct DME delivered: Elease Hashimoto 10/02/2018    Elease Hashimoto

## 2018-10-02 NOTE — Progress Notes (Signed)
[facility] PHYSICAL MEDICINE & REHABILITATION PROGRESS NOTE   Subjective/Complaints: Patient seen sitting up working with therapy this morning.  He states he slept well overnight.  He is questions regarding follow-up therapies and appointments.  ROS: Denies CP, SOB, N/V/D  Objective:   No results found. No results for input(s): WBC, HGB, HCT, PLT in the last 72 hours. Recent Labs    09/29/18 0957  NA 138  K 4.6  CL 104  CO2 25  GLUCOSE 149*  BUN 26*  CREATININE 1.63*  CALCIUM 8.3*    Intake/Output Summary (Last 24 hours) at 10/02/2018 0936 Last data filed at 10/02/2018 0735 Gross per 24 hour  Intake 880 ml  Output 1100 ml  Net -220 ml     Physical Exam: Vital Signs Blood pressure 106/77, pulse 73, temperature 98.2 F (36.8 C), resp. rate 18, weight 90.7 kg, SpO2 96 %. Constitutional: NAD.  Vital signs reviewed. HENT: Normocephalic.  Atraumatic. Eyes: EOMI.  No discharge. Cardiovascular: No JVD. Respiratory: CTA bilaterally.  Normal effort. GI: BS +. Non-distended. Musc: Right BKA, with tenderness along edges of incision, improving. Neurologic: Alert and oriented Motor:  5/5 right hip flexion, unchanged Skin: Right BKA with shrinker C/D/I  Assessment/Plan: 1. Functional deficits secondary to RIght BKA which require 3+ hours per day of interdisciplinary therapy in a comprehensive inpatient rehab setting.  Physiatrist is providing close team supervision and 24 hour management of active medical problems listed below.  Physiatrist and rehab team continue to assess barriers to discharge/monitor patient progress toward functional and medical goals  Care Tool:  Bathing    Body parts bathed by patient: Right arm, Left arm, Chest, Face, Abdomen, Front perineal area, Buttocks, Right upper leg, Left upper leg, Left lower leg     Body parts n/a: Right lower leg   Bathing assist Assist Level: Independent with assistive device     Upper Body Dressing/Undressing Upper  body dressing   What is the patient wearing?: Pull over shirt    Upper body assist Assist Level: Independent    Lower Body Dressing/Undressing Lower body dressing      What is the patient wearing?: Underwear/pull up, Pants     Lower body assist Assist for lower body dressing: Independent with assitive device     Toileting Toileting    Toileting assist Assist for toileting: Independent with assistive device     Transfers Chair/bed transfer  Transfers assist     Chair/bed transfer assist level: Supervision/Verbal cueing     Locomotion Ambulation   Ambulation assist   Ambulation activity did not occur: Safety/medical concerns  Assist level: Supervision/Verbal cueing Assistive device: Walker-rolling Max distance: 780ft   Walk 10 feet activity   Assist  Walk 10 feet activity did not occur: Safety/medical concerns  Assist level: Supervision/Verbal cueing Assistive device: Walker-rolling   Walk 50 feet activity   Assist Walk 50 feet with 2 turns activity did not occur: Safety/medical concerns  Assist level: Supervision/Verbal cueing Assistive device: Walker-rolling    Walk 150 feet activity   Assist Walk 150 feet activity did not occur: Safety/medical concerns         Walk 10 feet on uneven surface  activity   Assist Walk 10 feet on uneven surfaces activity did not occur: Safety/medical concerns         Wheelchair     Assist Will patient use wheelchair at discharge?: Yes Type of Wheelchair: Manual    Wheelchair assist level: Supervision/Verbal cueing Max wheelchair distance: 15500ft  Wheelchair 50 feet with 2 turns activity    Assist        Assist Level: Supervision/Verbal cueing   Wheelchair 150 feet activity     Assist     Assist Level: Supervision/Verbal cueing    Medical Problem List and Plan: 1.Decreased functional mobilitysecondary to right BKA 09/17/2018 with 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 Antithrombotics: -DVT/anticoagulation:SCD left lower extremity -antiplatelet therapy: aspirin 81 mg daily, Plavix 75 mg daily 3. Pain Management:Neurontin 300 mg 3 times a day, will consider increasing if pain interfering with progress  OxyCR 10mg  qhs helpful for sleep DC'd on 3/31  Oxy IR 5-10mg  q6prn, decreased to 5 mg every 6 as needed  Controlled on 4/9 4. Mood:Provide emotional support- rec Neuropsych for adjustment -antipsychotic agents: N/A 5. Neuropsych: This patientiscapable of making decisions on hisown behalf. 6. Skin/Wound Care:Routine skin checks  DCed VAC on 4/1 7. Fluids/Electrolytes/Nutrition:Routine ins and outs 8. Diabetes mellitus. Hemoglobin A1c 7.5.  Lantus insulin 30 units daily at bedtime, decreased to 27 on 4/8  Lantus 5 units daily added on 4/8  Tradjenta 5 mg daily. CBG (last 3)  Recent Labs    10/01/18 1640 10/01/18 2133 10/02/18 0647  GLUCAP 142* 163* 242*   Level on 4/9, will follow daily ambulatory monitoring with further adjustments as necessary 9. CAD with stenting. Continue aspirin and Plavix. No chest pain or shortness of breath 10. History of seizures. Keppra 500 mg daily. 11.CKD stage III. Creatinine baseline 1.35-1.80.   Creatinine 1.63 on 4/6  Continue to monitor 12. Hyperlipidemia.Zetia10 mg daily, Lipitor 20 mg daily 13. History of tobacco abuse. Provide counseling 14. Constipation. Laxative assistance 15.  Hypoalbuminemia  Supplement initiated on 3/30 16.  Acute blood loss anemia  Hemoglobin 10.2 on 4/3  Continue to monitor  LOS: 13 days A FACE TO FACE EVALUATION WAS PERFORMED  Melesa Lecy Karis Juba 10/02/2018, 9:36 AM

## 2018-10-03 LAB — GLUCOSE, CAPILLARY
Glucose-Capillary: 162 mg/dL — ABNORMAL HIGH (ref 70–99)
Glucose-Capillary: 127 mg/dL — ABNORMAL HIGH (ref 70–99)

## 2018-10-03 MED ORDER — LOPERAMIDE HCL 2 MG PO CAPS
4.0000 mg | ORAL_CAPSULE | Freq: Once | ORAL | Status: AC
  Administered 2018-10-03: 4 mg via ORAL
  Filled 2018-10-03: qty 2

## 2018-10-03 NOTE — Progress Notes (Addendum)
[name] scheduled for discharge today. Discharge orders have been placed.  [name] alert and oriented x 4. Patient discharged to Home with Discharge Instructions and Personal belongings. Patient transported off unit via wheelchair.

## 2018-10-03 NOTE — Progress Notes (Signed)
Patient medicated with PRN Imodium for c/o loose stools. Will monitor for effectiveness.

## 2018-10-08 ENCOUNTER — Inpatient Hospital Stay (INDEPENDENT_AMBULATORY_CARE_PROVIDER_SITE_OTHER): Payer: Managed Care, Other (non HMO) | Admitting: Family

## 2018-10-08 ENCOUNTER — Inpatient Hospital Stay (INDEPENDENT_AMBULATORY_CARE_PROVIDER_SITE_OTHER): Payer: Self-pay | Admitting: Family

## 2018-10-08 ENCOUNTER — Ambulatory Visit (INDEPENDENT_AMBULATORY_CARE_PROVIDER_SITE_OTHER): Payer: Managed Care, Other (non HMO) | Admitting: Family

## 2018-10-08 ENCOUNTER — Encounter (INDEPENDENT_AMBULATORY_CARE_PROVIDER_SITE_OTHER): Payer: Self-pay | Admitting: Family

## 2018-10-08 ENCOUNTER — Other Ambulatory Visit: Payer: Self-pay

## 2018-10-08 VITALS — Ht 74.0 in | Wt 201.0 lb

## 2018-10-08 DIAGNOSIS — S88119A Complete traumatic amputation at level between knee and ankle, unspecified lower leg, initial encounter: Secondary | ICD-10-CM

## 2018-10-08 DIAGNOSIS — Z89511 Acquired absence of right leg below knee: Secondary | ICD-10-CM

## 2018-10-08 NOTE — Progress Notes (Signed)
Post-Op Visit Note   Patient: Curtis Bradford           Date of Birth: November 26, 1960           MRN: [id number] Visit Date: 10/08/2018 PCP: Patient, No Pcp Per  Chief Complaint:  Chief Complaint  Patient presents with  . Right Leg - Routine Post Op    09/17/18 right BKA     HPI:  HPI  [name] a 58 year old gentleman seen 3 weeks status post right BKA revision. Healing well. Wonders where he should get his prosthesis. Ortho Exam Incision is nearly fully healed. Dry. No erythema no drainage. No sign of infection.     Visit Diagnoses: No diagnosis found.  Plan: staples harvested today. Continue daily incisional cleansing. Apply shrinker daily. Work on extension. Will call hanger for prosthesis set up. Follow up in office in 2 weeks.   Follow-Up Instructions: No follow-ups on file.   Imaging: No results found.  Orders:  No orders of the defined types were placed in this encounter.  No orders of the defined types were placed in this encounter.    PMFS History: Patient Active Problem List   Diagnosis Date Noted  . Intermittent pain   . Phantom limb pain (HCC)   . CKD (chronic kidney disease), stage III (HCC)   . Labile blood glucose   . Acute blood loss anemia   . Hypoalbuminemia due to protein-calorie malnutrition (HCC)   . Diabetes mellitus type 2 in nonobese (HCC)   . Postoperative pain   . Neuropathic pain   . Seizures (HCC)   . Right below-knee amputee (HCC) 09/19/2018  . Below knee amputation (HCC) 09/17/2018  . Gangrene of right foot (HCC)   . Status post transmetatarsal amputation of foot, right (HCC) 07/23/2018  . Osteomyelitis (HCC) 07/23/2018  . TIA (transient ischemic attack) 10/30/2017  . Medication monitoring encounter 03/28/2017  . Status post amputation of toe of right foot (HCC) 03/05/2017  . Subacute osteomyelitis, right ankle and foot (HCC)   . Peripheral neuropathy 02/16/2017  . Peripheral artery disease (HCC) 02/16/2017  . Type 2 diabetes  mellitus with diabetic foot ulcer (HCC) 02/14/2017  . Diabetic foot infection (HCC) 02/14/2017  . Type II diabetes mellitus with renal manifestations (HCC)   . HLD (hyperlipidemia)   . Tobacco abuse   . CKD stage 2 due to type 2 diabetes mellitus (HCC)   . CAD (coronary artery disease)   . Seizure disorder [facility])    Past Medical History:  Diagnosis Date  . CAD (coronary artery disease)   . CKD (chronic kidney disease), stage III (HCC)   . 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 History of DVT (deep vein thrombosis)   . History of kidney stones    passed stones - no surgery  . HLD (hyperlipidemia)   . Myocardial infarction (HCC) 2009  . Peripheral vascular disease (HCC)    right leg  . Seizure (HCC)    last one 2015  . Stroke [facility]) 2012, 10/2017   2012-speech was effected- has come back.10/2017- numbness of right side no taste right  side. 2012-  . Tobacco abuse     Family History  Problem Relation Age of Onset  . Diabetes Mellitus II Mother   . Diabetes Mellitus II Father     Past Surgical History:  Procedure Laterality Date  . AMPUTATION Right 02/23/2017   Procedure: RIGHT FOOT 5TH RAY AMPUTATION;  Surgeon: Lajoyce Corners,  Randa EvensMarcus V, MD;  Location: [facility] OR;  Service: Orthopedics;  Laterality: Right;  . AMPUTATION Right 04/23/2018   Procedure: Right Great Toe Amputation;  Surgeon: Nadara Mustarduda, Marcus V, MD;  [facility];  Service: Orthopedics;  Laterality: Right;  . AMPUTATION Right 07/23/2018   Procedure: RIGHT TRANSMETATARSAL AMPUTATION, APPLY WOUND VAC;  Surgeon: Nadara Mustarduda, Marcus V, MD;  Location: [facility] OR;  Service: Orthopedics;  Laterality: Right;  . AMPUTATION Right 09/17/2018   Procedure: RIGHT AMPUTATION BELOW KNEE;  Surgeon: Nadara Mustarduda, Marcus V, MD;  [facility];  Service: Orthopedics;  Laterality: Right;  . APPLICATION OF WOUND VAC Right 09/17/2018   Procedure: Application Of Wound Vac;  Surgeon: Nadara Mustarduda, Marcus V, MD;  [facility];  Service: Orthopedics;  Laterality: Right;   . BELOW KNEE LEG AMPUTATION Right 09/17/2018  . CORONARY ANGIOPLASTY  03/05/2008   stent  . FOOT SURGERY    . PERIPHERAL ARTERIAL STENT GRAFT Right 2014   leg 2 stents- clotted per pat  . STENT PLACEMENT VASCULAR ([facility] HX)     Several in right leg  . TONSILLECTOMY    . TRANSMETATARSAL AMPUTATION Right 07/23/2018   Social History   Occupational History  . Not on file  Tobacco Use  . Smoking status: Light Tobacco Smoker    Packs/day: 0.25    Years: 15.00    Pack years: 3.75    Types: Cigarettes  . Smokeless tobacco: Never Used  . Tobacco comment: less than a pack a week  Substance and Sexual Activity  . Alcohol use: Not Currently    Comment: seldom beer  . Drug use: No  . Sexual activity: Not on file

## 2018-10-09 ENCOUNTER — Telehealth: Payer: Self-pay | Admitting: *Deleted

## 2018-10-09 NOTE — Telephone Encounter (Signed)
Hospital follow up call completed, Appointment set, Scheduled for the 30th of April as the patient has a same day appt with Dr. Lajoyce Corners and limited transportation, address confirmed, new patient packet sent  Transitional Care Questions   Questions for our staff to ask patients on Transitional care 48 hour phone call:   1. Are you/is patient experiencing any problems since coming home? Patient initially struggled with a bout of diarrhea.  He feels it is now under control. Are there any questions regarding any aspect of care? Patient asked about the timetable regarding prosthetic care. What steps will be taken and with whom. I counseled the patient to the best of my knowledge drawn from personal experience.  2. Are there any questions regarding medications administration/dosing? No  Are meds being taken as prescribed? Yes  Patient should review meds with caller to confirm   3. Have there been any falls? No  4. Has Home Health been to the house and/or have they contacted you? Yes, he has been visited by RN and PT  If not, have you tried to contact them? Can we help you contact them?   5. Are bowels and bladder emptying properly? Yes, diarrhea under control Are there any unexpected incontinence issues? No If applicable, is patient following bowel/bladder programs?   6. Any fevers, problems with breathing, unexpected pain? No  7. Are there any skin problems or new areas of breakdown? Surgical wound healing nicely.   8. Has the patient/family member arranged specialty MD follow up (ie cardiology/neurology/renal/surgical/etc)? Yes Can we help arrange?   9. Does the patient need any other services or support that we can help arrange? No, just concerned about prosthetic care  10. Are caregivers following through as expected in assisting the patient? Yes  11. Has the patient quit smoking, drinking alcohol, or using drugs as recommended? Patient does not drink or do drugs.  He smokes occasionally

## 2018-10-20 ENCOUNTER — Telehealth: Payer: Self-pay | Admitting: Physical Medicine & Rehabilitation

## 2018-10-20 NOTE — Telephone Encounter (Signed)
Prolonged discussion with patient regarding symptoms.  He complains of diarrhea that waxes and wanes.  He notes improvement with bread and coke.  He notes blood tinged mucous today.  Denies abdominal pain, nausea, fevers/chills, foul odor.  Encouraged patient to find PCP or follow up with Endo, if they practice IM.  If not, only option may be urgent care for further evaluation.  Encouraged not to use imodium for time being, but can try fiber.

## 2018-10-20 NOTE — Telephone Encounter (Signed)
Called ptn and home health was [facility] was in home during call.  They state he has extreme diarrhea now with blood and mucous - they have told him it could be Cdiff and he needs to be tested - he was advised he needed to take probiotics and immodium --- URGENT REQUEST  What does he need to do - he was told not to go to emergency room- they fear is anemia at this point  Number to reach [phone]

## 2018-10-22 ENCOUNTER — Emergency Department (HOSPITAL_BASED_OUTPATIENT_CLINIC_OR_DEPARTMENT_OTHER)
Admission: EM | Admit: 2018-10-22 | Discharge: 2018-10-22 | Disposition: A | Discharge status: Home / Self Care | Payer: Managed Care, Other (non HMO) | Attending: Emergency Medicine | Admitting: Emergency Medicine

## 2018-10-22 ENCOUNTER — Other Ambulatory Visit: Payer: Self-pay

## 2018-10-22 ENCOUNTER — Encounter (HOSPITAL_BASED_OUTPATIENT_CLINIC_OR_DEPARTMENT_OTHER): Payer: Self-pay

## 2018-10-22 DIAGNOSIS — E1122 Type 2 diabetes mellitus with diabetic chronic kidney disease: Secondary | ICD-10-CM | POA: Diagnosis not present

## 2018-10-22 DIAGNOSIS — I252 Old myocardial infarction: Secondary | ICD-10-CM | POA: Insufficient documentation

## 2018-10-22 DIAGNOSIS — N189 Chronic kidney disease, unspecified: Secondary | ICD-10-CM | POA: Diagnosis not present

## 2018-10-22 DIAGNOSIS — Z79899 Other long term (current) drug therapy: Secondary | ICD-10-CM | POA: Diagnosis not present

## 2018-10-22 DIAGNOSIS — Z794 Long term (current) use of insulin: Secondary | ICD-10-CM | POA: Insufficient documentation

## 2018-10-22 DIAGNOSIS — I2510 Atherosclerotic heart disease of native coronary artery without angina pectoris: Secondary | ICD-10-CM | POA: Insufficient documentation

## 2018-10-22 DIAGNOSIS — F17210 Nicotine dependence, cigarettes, uncomplicated: Secondary | ICD-10-CM | POA: Diagnosis not present

## 2018-10-22 DIAGNOSIS — Z8673 Personal history of transient ischemic attack (TIA), and cerebral infarction without residual deficits: Secondary | ICD-10-CM | POA: Insufficient documentation

## 2018-10-22 DIAGNOSIS — A0472 Enterocolitis due to Clostridium difficile, not specified as recurrent: Secondary | ICD-10-CM | POA: Insufficient documentation

## 2018-10-22 DIAGNOSIS — Z7902 Long term (current) use of antithrombotics/antiplatelets: Secondary | ICD-10-CM | POA: Diagnosis not present

## 2018-10-22 DIAGNOSIS — Z7982 Long term (current) use of aspirin: Secondary | ICD-10-CM | POA: Insufficient documentation

## 2018-10-22 DIAGNOSIS — N183 Chronic kidney disease, stage 3 (moderate): Secondary | ICD-10-CM | POA: Insufficient documentation

## 2018-10-22 DIAGNOSIS — R197 Diarrhea, unspecified: Secondary | ICD-10-CM | POA: Diagnosis present

## 2018-10-22 LAB — CBC WITH DIFFERENTIAL/PLATELET
Abs Immature Granulocytes: 0.13 10*3/uL — ABNORMAL HIGH (ref 0.00–0.07)
Basophils Absolute: 0.1 10*3/uL (ref 0.0–0.1)
Basophils Relative: 1 %
Eosinophils Absolute: 0.5 10*3/uL (ref 0.0–0.5)
Eosinophils Relative: 2 %
HCT: 44.1 % (ref 39.0–52.0)
Hemoglobin: 14.2 g/dL (ref 13.0–17.0)
Immature Granulocytes: 1 %
Lymphocytes Relative: 12 %
Lymphs Abs: 2.6 10*3/uL (ref 0.7–4.0)
MCH: 29.2 pg (ref 26.0–34.0)
MCHC: 32.2 g/dL (ref 30.0–36.0)
MCV: 90.7 fL (ref 80.0–100.0)
Monocytes Absolute: 1.6 10*3/uL — ABNORMAL HIGH (ref 0.1–1.0)
Monocytes Relative: 7 %
Neutro Abs: 16.9 10*3/uL — ABNORMAL HIGH (ref 1.7–7.7)
Neutrophils Relative %: 77 %
Platelets: 361 10*3/uL (ref 150–400)
RBC: 4.86 MIL/uL (ref 4.22–5.81)
RDW: 13.9 % (ref 11.5–15.5)
WBC: 21.7 10*3/uL — ABNORMAL HIGH (ref 4.0–10.5)
nRBC: 0 % (ref 0.0–0.2)

## 2018-10-22 LAB — COMPREHENSIVE METABOLIC PANEL
ALT: 20 U/L (ref 0–44)
AST: 12 U/L — ABNORMAL LOW (ref 15–41)
Albumin: 3.9 g/dL (ref 3.5–5.0)
Alkaline Phosphatase: 119 U/L (ref 38–126)
Anion gap: 11 (ref 5–15)
BUN: 17 mg/dL (ref 6–20)
CO2: 23 mmol/L (ref 22–32)
Calcium: 9.1 mg/dL (ref 8.9–10.3)
Chloride: 104 mmol/L (ref 98–111)
Creatinine, Ser: 1.41 mg/dL — ABNORMAL HIGH (ref 0.61–1.24)
GFR calc Af Amer: >60 mL/min (ref >60)
GFR calc non Af Amer: 55 mL/min — ABNORMAL LOW (ref >60)
Glucose, Bld: 116 mg/dL — ABNORMAL HIGH (ref 70–99)
Potassium: 3.8 mmol/L (ref 3.5–5.1)
Sodium: 138 mmol/L (ref 135–145)
Total Bilirubin: 0.5 mg/dL (ref 0.3–1.2)
Total Protein: 7.1 g/dL (ref 6.5–8.1)

## 2018-10-22 LAB — OCCULT BLOOD X 1 CARD TO LAB, STOOL: Fecal Occult Bld: NEGATIVE

## 2018-10-22 LAB — LIPASE, BLOOD: Lipase: 23 U/L (ref 11–51)

## 2018-10-22 LAB — C DIFFICILE QUICK SCREEN W PCR REFLEX
C Diff antigen: POSITIVE — AB
C Diff interpretation: DETECTED
C Diff toxin: POSITIVE — AB

## 2018-10-22 MED ORDER — VANCOMYCIN 50 MG/ML ORAL SOLUTION
125.0000 mg | Freq: Once | ORAL | Status: DC
  Filled 2018-10-22: qty 2.5

## 2018-10-22 MED ORDER — METRONIDAZOLE 500 MG PO TABS: 500.0000 mg | ORAL_TABLET | Freq: Four times a day (QID) | ORAL | 0 refills | Status: DC

## 2018-10-22 MED ORDER — VANCOMYCIN HCL 125 MG PO CAPS: 125.0000 mg | ORAL_CAPSULE | Freq: Four times a day (QID) | ORAL | 0 refills | Status: AC

## 2018-10-22 NOTE — ED Provider Notes (Signed)
[facility] EMERGENCY DEPARTMENT Provider Note   CSN: [id number] Arrival date & time: 10/22/18  1505    History   Chief Complaint Chief Complaint  Patient presents with  . Diarrhea    HPI Curtis Bradford is a 58 y.o. male.     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 States he was recently admitted from 3/25-4/9 after he had right BKA. Has tried imodium without relief. States diarrhea has been intermittent since discharge. Reports 6-9 episodes daily. Episodes were initially watery and mucousy. For the last few days he has had some bright red blood tinges in his stool. No grossly bloody stools or dark/tarry stools. Had some abd pain this AM that has resolved. No NV. No urinary sxs. No fevers.   States he was treated with abx for 3 weeks prior to his recent hospital admission. No current abx use and no abx since his surgery.   He is on plavix and asa. Does not take other NSAIDs. No ETOH use. Never had GI bleed.   Past Medical History:  Diagnosis Date  . CAD (coronary artery disease)   . CKD (chronic kidney disease), stage III (HCC)   . 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 History of DVT (deep vein thrombosis)   . History of kidney stones    passed stones - no surgery  . HLD (hyperlipidemia)   . Myocardial infarction (HCC) 2009  . Peripheral vascular disease (HCC)    right leg  . Seizure (HCC)    last one 2015  . Stroke [facility]) 2012, 10/2017   2012-speech was effected- has come back.10/2017- numbness of right side no taste right  side. 2012-  . Tobacco abuse     Patient Active Problem List   Diagnosis Date Noted  . Intermittent pain   . Phantom limb pain (HCC)   . CKD (chronic kidney disease), stage III (HCC)   . Labile blood glucose   . Acute blood loss anemia   .  Hypoalbuminemia due to protein-calorie malnutrition (HCC)   . Diabetes mellitus type 2 in nonobese (HCC)   . Postoperative pain   . Neuropathic pain   . Seizures (HCC)   . Right below-knee amputee (HCC) 09/19/2018  . Below knee amputation (HCC) 09/17/2018  . Gangrene of right foot (HCC)   . Status post transmetatarsal amputation of foot, right (HCC) 07/23/2018  . Osteomyelitis (HCC) 07/23/2018  . TIA (transient ischemic attack) 10/30/2017  . Medication monitoring encounter 03/28/2017  . Status post amputation of toe of right foot (HCC) 03/05/2017  . Subacute osteomyelitis, right ankle and foot (HCC)   . Peripheral neuropathy 02/16/2017  . Peripheral artery disease (HCC) 02/16/2017  . 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 Diabetic foot infection (HCC) 02/14/2017  . Type II diabetes mellitus with renal manifestations (HCC)   . HLD (hyperlipidemia)   . Tobacco abuse   . CKD stage 2 due to type 2 diabetes mellitus (HCC)   . CAD (coronary artery disease)   . Seizure disorder [facility])     Past Surgical History:  Procedure Laterality Date  . AMPUTATION Right 02/23/2017   Procedure: RIGHT FOOT 5TH RAY AMPUTATION;  Surgeon: [name], MD;  Location:  [facility] OR;  Service: Orthopedics;  Laterality: Right;  . AMPUTATION Right 04/23/2018   Procedure: Right Great Toe Amputation;  Surgeon: Nadara Mustarduda, Marcus V, MD;  [facility];  Service: Orthopedics;  Laterality: Right;  . AMPUTATION Right 07/23/2018   Procedure: RIGHT TRANSMETATARSAL AMPUTATION, APPLY WOUND VAC;  Surgeon: Nadara Mustarduda, Marcus V, MD;  Location: [facility] OR;  Service: Orthopedics;  Laterality: Right;  . AMPUTATION Right 09/17/2018   Procedure: RIGHT AMPUTATION BELOW KNEE;  Surgeon: Nadara Mustarduda, Marcus V, MD;  [facility];  Service: Orthopedics;  Laterality: Right;  . APPLICATION OF WOUND VAC Right 09/17/2018   Procedure: Application Of Wound Vac;  Surgeon: Nadara Mustarduda, Marcus V, MD;  [facility];  Service: Orthopedics;   Laterality: Right;  . BELOW KNEE LEG AMPUTATION Right 09/17/2018  . CORONARY ANGIOPLASTY  03/05/2008   stent  . FOOT SURGERY    . PERIPHERAL ARTERIAL STENT GRAFT Right 2014   leg 2 stents- clotted per pat  . STENT PLACEMENT VASCULAR ([facility] HX)     Several in right leg  . TONSILLECTOMY    . TRANSMETATARSAL AMPUTATION Right 07/23/2018        Home Medications    Prior to Admission medications   Medication Sig Start Date End Date Taking? Authorizing Provider  acetaminophen (TYLENOL) 325 MG tablet Take 1-2 tablets (325-650 mg total) by mouth every 6 (six) hours as needed for mild pain (pain score 1-3 or temp > 100.5). 10/02/18   Angiulli, Mcarthur Rossettianiel J, PA-C  aspirin EC 81 MG tablet Take 81 mg by mouth daily.     [provider]  atorvastatin (LIPITOR) 20 MG tablet Take 1 tablet (20 mg total) by mouth daily. 10/02/18   Angiulli, Mcarthur Rossettianiel J, PA-C  clopidogrel (PLAVIX) 75 MG tablet Take 1 tablet (75 mg total) by mouth at bedtime. 10/02/18   Angiulli, Mcarthur Rossettianiel J, PA-C  Continuous Blood Gluc Sensor (FREESTYLE LIBRE 14 DAY SENSOR) MISC Place 1 patch onto the skin every 14 (fourteen) days. 06/26/18   Reather LittlerKumar, Ajay, MD  ezetimibe (ZETIA) 10 MG tablet Take 1 tablet (10 mg total) by mouth daily. 10/02/18   Angiulli, Mcarthur Rossettianiel J, PA-C  gabapentin (NEURONTIN) 300 MG capsule Take 1 capsule (300 mg total) by mouth 3 (three) times daily. 10/02/18   Angiulli, Mcarthur Rossettianiel J, PA-C  Insulin Glargine (LANTUS) 100 UNIT/ML Solostar Pen Inject 5 Units into the skin daily. 10/02/18   Angiulli, Mcarthur Rossettianiel J, PA-C  Insulin Glargine (LANTUS) 100 UNIT/ML Solostar Pen Inject 27 Units into the skin at bedtime. 10/02/18   Angiulli, Mcarthur Rossettianiel J, PA-C  levETIRAcetam (KEPPRA XR) 500 MG 24 hr tablet Take 1 tablet (500 mg total) by mouth daily. 10/02/18   Angiulli, Mcarthur Rossettianiel J, PA-C  linagliptin (TRADJENTA) 5 MG TABS tablet Take 1 tablet (5 mg total) by mouth daily. 10/02/18   Angiulli, Mcarthur Rossettianiel J, PA-C  linagliptin (TRADJENTA) 5 MG TABS tablet Take 1 tablet (5 mg  total) by mouth daily. 10/02/18   Angiulli, Mcarthur Rossettianiel J, PA-C  methocarbamol (ROBAXIN) 500 MG tablet Take 1 tablet (500 mg total) by mouth every 6 (six) hours as needed for muscle spasms. 10/02/18   Angiulli, Mcarthur Rossettianiel J, PA-C  oxyCODONE (OXY IR/ROXICODONE) 5 MG immediate release tablet Take 1 tablet (5 mg total) by mouth every 6 (six) hours as needed for moderate pain (pain score 4-6). 10/02/18   Angiulli, Mcarthur Rossettianiel J, PA-C    Family History Family History  Problem Relation Age of Onset  . Diabetes Mellitus II Mother   . Diabetes Mellitus  II Father     Social History Social History   Tobacco Use  . Smoking status: Light Tobacco Smoker    Packs/day: 0.25    Years: 15.00    Pack years: 3.75    Types: Cigarettes  . Smokeless tobacco: Never Used  Substance Use Topics  . Alcohol use: Not Currently  . Drug use: No     Allergies   Patient has no known allergies.   Review of Systems Review of Systems  Constitutional: Negative for fever.  HENT: Negative for ear pain and sore throat.   Eyes: Negative for visual disturbance.  Respiratory: Negative for cough and shortness of breath.   Cardiovascular: Negative for chest pain.  Gastrointestinal: Positive for abdominal pain (resolved), anal bleeding, blood in stool and diarrhea. Negative for constipation, nausea and vomiting.  Genitourinary: Negative for dysuria and hematuria.  Musculoskeletal: Negative for back pain.  Skin: Negative for rash.  Neurological: Negative for headaches.  All other systems reviewed and are negative.  Physical Exam Updated Vital Signs BP (!) 129/99 (BP Location: Left Arm)   Pulse 96   Temp 98.4 F (36.9 C) (Oral)   Resp 16   Ht 6\' 2"  (1.88 m)   Wt 83.9 kg   SpO2 99%   BMI 23.75 kg/m   Physical Exam Vitals signs and nursing note reviewed.  Constitutional:      General: He is not in acute distress.    Appearance: He is well-developed. He is not ill-appearing or toxic-appearing.     Comments: Nontoxic  appearing  HENT:     Head: Normocephalic and atraumatic.  Eyes:     Conjunctiva/sclera: Conjunctivae normal.  Neck:     Musculoskeletal: Neck supple.  Cardiovascular:     Rate and Rhythm: Normal rate and regular rhythm.     Heart sounds: Normal heart sounds. No murmur.  Pulmonary:     Effort: Pulmonary effort is normal. No respiratory distress.     Breath sounds: Normal breath sounds.  Abdominal:     General: Bowel sounds are normal. There is no distension.     Palpations: Abdomen is soft.     Tenderness: There is no abdominal tenderness. There is no right CVA tenderness, left CVA tenderness, guarding or rebound.  Genitourinary:    Comments: DRE completed with chaperone present. No external hemorrhoids or obvious fissures present. No internal hemorrhoids palpated. No significant stool burden in rectal vault, no obvious melena or gross blood. Skin:    General: Skin is warm and dry.  Neurological:     Mental Status: He is alert.      ED Treatments / Results  Labs (all labs ordered are listed, but only abnormal results are displayed) Labs Reviewed - No data to display  EKG None  Radiology No results found.  Procedures Procedures (including critical care time)  Medications Ordered in ED Medications - No data to display   Initial Impression / Assessment and Plan / ED Course  I have reviewed the triage vital signs and the nursing notes.  Pertinent labs & imaging results that were available during my care of the patient were reviewed by me and considered in my medical decision making (see chart for details).     Final Clinical Impressions(s) / ED Diagnoses   Final diagnoses:  None   Pt present with persistent diarrhea for the last 3 weeks. Had recent extensive abx therapy then had subsequent BKA last month with several week hospital stay. Upon discharge he developed the  diarrhea. No improvement with immodium. No fevers or abd pain.  CBC with leukocytosis at 21, no  anemia noted making complicated GI bleed much less likely CMP with no gross electrolyte derangement, normal kidney and liver function Lipase negative Hemoccult is negative -- I have low suspicion for complicated GI bleed given this is negative, DRE was reassuring, and hgb is stable. He likely has uncomplicated bleeding from hemorrhoids or anal fissure.   Cdif positive GI panel pending Ova and parasite pending   Patient C. difficile positive, will treat with oral Vanco.  First dose given in the ED.  I do think that he is a candidate for outpatient therapy for this as his labs do not show significant dehydration and he is well-appearing on exam and is afebrile.  I have given him a referral to gastroenterology to follow-up on the remainder of his stool studies.  I have advised against him taking Imodium until we can receive the results of these tests.  Have given him strict return precautions.  He voices understanding of the plan and reasons to return to the ED.  All questions answered.  Patient stable for discharge.   ED Discharge Orders    None       Rayne Du 10/22/18 Greer Pickerel, MD 10/23/18 1623

## 2018-10-22 NOTE — ED Triage Notes (Addendum)
Pt c/o diarrhea since 4/9 during hosp for right BKA 3/24-pt states he was advised to come to ED "by the doctor over my PT"-to triage in w/c-NAD

## 2018-10-22 NOTE — Discharge Instructions (Addendum)
Your c dif testing today was positive. You will be treated will antibiotics. Please take the antibiotic prescription fully.    The remainder of the tests will take several days to result. You will need to follow up with gastroenterology in the next week to follow up on the remainder of your results and to re-evaluate your symptoms.   If you develop abdominal pain, fevers, significantly worse diarrhea, bloody/dark stools, vomiting or dehydration then you will need to return to the emergency department immediately.

## 2018-10-22 NOTE — ED Notes (Signed)
EDP made aware that vanco solution PO is not available.

## 2018-10-23 ENCOUNTER — Encounter: Payer: Self-pay | Admitting: Physical Medicine & Rehabilitation

## 2018-10-23 ENCOUNTER — Encounter
Payer: Managed Care, Other (non HMO) | Attending: Physical Medicine & Rehabilitation | Admitting: Physical Medicine & Rehabilitation

## 2018-10-23 ENCOUNTER — Ambulatory Visit (INDEPENDENT_AMBULATORY_CARE_PROVIDER_SITE_OTHER): Payer: Managed Care, Other (non HMO) | Admitting: Orthopedic Surgery

## 2018-10-23 ENCOUNTER — Other Ambulatory Visit: Payer: Self-pay

## 2018-10-23 DIAGNOSIS — M792 Neuralgia and neuritis, unspecified: Secondary | ICD-10-CM

## 2018-10-23 DIAGNOSIS — E119 Type 2 diabetes mellitus without complications: Secondary | ICD-10-CM

## 2018-10-23 DIAGNOSIS — G40909 Epilepsy, unspecified, not intractable, without status epilepticus: Secondary | ICD-10-CM

## 2018-10-23 DIAGNOSIS — R269 Unspecified abnormalities of gait and mobility: Secondary | ICD-10-CM

## 2018-10-23 DIAGNOSIS — A0472 Enterocolitis due to Clostridium difficile, not specified as recurrent: Secondary | ICD-10-CM

## 2018-10-23 DIAGNOSIS — Z89511 Acquired absence of right leg below knee: Secondary | ICD-10-CM

## 2018-10-23 DIAGNOSIS — G8918 Other acute postprocedural pain: Secondary | ICD-10-CM

## 2018-10-23 DIAGNOSIS — G546 Phantom limb syndrome with pain: Secondary | ICD-10-CM

## 2018-10-23 LAB — GASTROINTESTINAL PANEL BY PCR, STOOL (REPLACES STOOL CULTURE)

## 2018-10-23 NOTE — Progress Notes (Signed)
Subjective:    Patient ID: Curtis Bradford, male    DOB: Oct 31, 1960, 58 y.o.   MRN: [id number]  TELEHEALTH NOTE  Due to national recommendations of social distancing due to COVID 19, an audio/video telehealth visit is felt to be most appropriate for this patient at this time.  See Chart message from today for the patient's consent to telehealth from [facility].     I verified that I am speaking with the correct person using two identifiers.  Location of patient: Home Location of provider: Home Method of communication: Telephone Names of participants : Wadie Lessen scheduling, Suezanne Jacquet obtaining consent and vitals if available Established patient Time spent on call: 42 minutes in total  HPI 58 year old right-handed male with history of CAD with stenting maintained on aspirin and Plavix, CKD stage III , diabetes mellitus, hypertension, CVA 2012, tobacco abuse, seizure disorder presents for hospital follow up after receiving CIR for right BKA.  Since discharge, had prolonged discussion with patient regarding diarrhea.  He states did a video call with an MD and was told to go to the ED.  He ended up going to the ED and diagnosed with C.Diff. He was started on medication.  Since that time, he notes improvement in diarrhea.  He postponed his Ortho appointment until next week. He no longer is taking Gabapentin and notes improvement.  CBGs have been labile. Denies seizures.     Therapies: 3/week Mobility: Walker at home, wheelchair in community DME: Bedside commode, purchased tub bench  Pain Inventory Average Pain 7 Pain Right Now 0 My pain is intermittent and sharp  In the last 24 hours, has pain interfered with the following? General activity 0 Relation with others 0 Enjoyment of life 0 What TIME of day is your pain at its worst? night Sleep (in general) Good  Pain is worse with: some activites Pain improves with: rest and medication Relief  from Meds: 6  Mobility use a wheelchair needs help with transfers transfers alone  Function disabled: date disabled . I need assistance with the following:  meal prep, household duties and shopping  Neuro/Psych bowel control problems trouble walking  Prior Studies Any changes since last visit?  yes  +c diff on treatment as of 10/22/18  Physicians involved in your care Any changes since last visit?  no   Family History  Problem Relation Age of Onset  . Diabetes Mellitus II Mother   . Diabetes Mellitus II Father    Social History   Socioeconomic History  . Marital status: Single    Spouse name: Not on file  . Number of children: Not on file  . Years of education: Not on file  . Highest education level: Not on file  Occupational History  . Not on file  Social Needs  . Financial resource strain: Not on file  . Food insecurity:    Worry: Not on file    Inability: Not on file  . Transportation needs:    Medical: Not on file    Non-medical: Not on file  Tobacco Use  . Smoking status: Light Tobacco Smoker    Packs/day: 0.25    Years: 15.00    Pack years: 3.75    Types: Cigarettes  . Smokeless tobacco: Never Used  Substance and Sexual Activity  . Alcohol use: Not Currently  . Drug use: No  . Sexual activity: Not on file  Lifestyle  . Physical activity:    Days per week:  Not on file    Minutes per session: Not on file  . Stress: Not on file  Relationships  . Social connections:    Talks on phone: Not on file    Gets together: Not on file    Attends religious service: Not on file    Active member of club or organization: Not on file    Attends meetings of clubs or organizations: Not on file    Relationship status: Not on file  Other Topics Concern  . Not on file  Social History Narrative  . Not on file   Past Surgical History:  Procedure Laterality Date  . AMPUTATION Right 02/23/2017   Procedure: RIGHT FOOT 5TH RAY AMPUTATION;  Surgeon: [name],  MD;  Location: [facility] OR;  Service: Orthopedics;  Laterality: Right;  . AMPUTATION Right 04/23/2018   Procedure: Right Great Toe Amputation;  Surgeon: [name], MD;  Location: [facility] OR;  Service: Orthopedics;  Laterality: Right;  . AMPUTATION Right 07/23/2018   Procedure: RIGHT TRANSMETATARSAL AMPUTATION, APPLY WOUND VAC;  Surgeon: [name], MD;  Location: [facility] OR;  Service: Orthopedics;  Laterality: Right;  . AMPUTATION Right 09/17/2018   Procedure: RIGHT AMPUTATION BELOW KNEE;  Surgeon: [name], MD;  Location: [facility] OR;  Service: Orthopedics;  Laterality: Right;  . APPLICATION OF WOUND VAC Right 09/17/2018   Procedure: Application Of Wound Vac;  Surgeon: [name], MD;  Location: [facility] OR;  Service: Orthopedics;  Laterality: Right;  . BELOW KNEE LEG AMPUTATION Right 09/17/2018  . CORONARY ANGIOPLASTY  03/05/2008   stent  . FOOT SURGERY    . PERIPHERAL ARTERIAL STENT GRAFT Right 2014   leg 2 stents- clotted per pat  . STENT PLACEMENT VASCULAR ([facility] HX)     Several in right leg  . TONSILLECTOMY    . TRANSMETATARSAL AMPUTATION Right 07/23/2018   Past Medical History:  Diagnosis Date  . CAD (coronary artery disease)   . CKD (chronic kidney disease), stage III (HCC)   . 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 History of DVT (deep vein thrombosis)   . History of kidney stones    passed stones - no surgery  . HLD (hyperlipidemia)   . Myocardial infarction (HCC) 2009  . Peripheral vascular disease (HCC)    right leg  . Seizure (HCC)    last one 2015  . Stroke [facility]) 2012, 10/2017   2012-speech was effected- has come back.10/2017- numbness of right side no taste right  side. 2012-  . Tobacco abuse    There were no vitals taken for this visit.  Opioid Risk Score:   Fall Risk Score:  `1  Depression screen PHQ 2/9  Depression screen [facility] 2/9 10/23/2018 04/11/2017 03/28/2017  Decreased Interest 0 0 0  Down, Depressed, Hopeless 0 0 0  PHQ - 2 Score 0 0 0    Review of Systems  Constitutional: Negative.   HENT: Negative.   Eyes: Negative.   Respiratory: Negative.   Cardiovascular: Negative.   Gastrointestinal: Positive for diarrhea.       C diff  Endocrine: Negative.   Genitourinary: Negative.   Musculoskeletal: Positive for gait problem.  Skin: Negative.   Allergic/Immunologic: Negative.   Hematological: Bruises/bleeds easily.       Plavix  Psychiatric/Behavioral: Negative.   All other systems reviewed and are negative.      Objective:   Physical Exam Gen: NAD. Pulm: Effort normal Neuro: Alert and oriented  Assessment & Plan:  58 year old right-handed male with history of CAD with stenting maintained on aspirin and Plavix, CKD stage III , diabetes mellitus, hypertension, CVA 2012, tobacco abuse, seizure disorder presents for hospital follow up after receiving CIR for right BKA.  1.  Decreased functional mobility secondary to right BKA 09/17/2018 with wound VAC after dehiscence of TMA amputation January 2020             Cont therapies  Follow up with Ortho  2. Pain Management:   Improving pain  3. Diabetes mellitus.   Cont meds  Appears to be improving control after C.Diff treatment initated  4. C. Diff  Cont oral Vanc  5. Gait abnormality  Cont walker/wheelchair for safety  Cont therapies

## 2018-10-24 LAB — OVA + PARASITE EXAM

## 2018-10-24 LAB — O&P RESULT

## 2018-10-27 ENCOUNTER — Other Ambulatory Visit: Payer: Self-pay

## 2018-10-27 ENCOUNTER — Ambulatory Visit (INDEPENDENT_AMBULATORY_CARE_PROVIDER_SITE_OTHER): Payer: Managed Care, Other (non HMO) | Admitting: Orthopedic Surgery

## 2018-10-27 ENCOUNTER — Encounter (INDEPENDENT_AMBULATORY_CARE_PROVIDER_SITE_OTHER): Payer: Self-pay | Admitting: Physician Assistant

## 2018-10-27 ENCOUNTER — Encounter: Payer: Self-pay | Admitting: Orthopedic Surgery

## 2018-10-27 VITALS — Ht 74.0 in | Wt 185.0 lb

## 2018-10-27 DIAGNOSIS — E1142 Type 2 diabetes mellitus with diabetic polyneuropathy: Secondary | ICD-10-CM

## 2018-10-27 DIAGNOSIS — S88119A Complete traumatic amputation at level between knee and ankle, unspecified lower leg, initial encounter: Secondary | ICD-10-CM

## 2018-10-27 DIAGNOSIS — E1122 Type 2 diabetes mellitus with diabetic chronic kidney disease: Secondary | ICD-10-CM

## 2018-10-27 DIAGNOSIS — Z89611 Acquired absence of right leg above knee: Secondary | ICD-10-CM

## 2018-10-27 DIAGNOSIS — N182 Chronic kidney disease, stage 2 (mild): Secondary | ICD-10-CM

## 2018-10-27 DIAGNOSIS — Z794 Long term (current) use of insulin: Secondary | ICD-10-CM

## 2018-10-27 MED ORDER — OXYCODONE HCL 5 MG PO TABS: 5.0000 mg | ORAL_TABLET | Freq: Four times a day (QID) | ORAL | 0 refills | Status: DC | PRN

## 2018-10-27 NOTE — Progress Notes (Signed)
Office Visit Note   Patient: Curtis Bradford           Date of Birth: 11-21-1960           MRN: [id number] Visit Date: 10/27/2018              Requested by: No referring provider defined for this encounter. PCP: Patient, No Pcp Per  Chief Complaint  Patient presents with  . Right Leg - Routine Post Op    09/17/18 right BKA       HPI: The patient is a 58 year old gentleman who is seen for postoperative follow-up following a right below the knee revision on 09/17/2018.  He has been working with [facility] and is nearly ready for prosthetic fabrication.  He is wearing a 3 XL shrinker.  He does note that he had 1 retained staple in the incisional area and this was removed today.  He is otherwise feeling like he has been doing better.  He is continuing to have some residual pain and request 1 more refill on his pain medication this visit.  Assessment & Plan: Visit Diagnoses:  1. Below knee amputation (HCC)   2. Type 2 diabetes mellitus with diabetic polyneuropathy, with long-term current use of insulin (HCC)   3. CKD stage 2 due to type 2 diabetes mellitus (HCC)     Plan: Patient will continue to work with [facility] for prosthetic fabrication.  He also would like to switch out to a smaller shrinker stocking and will was given a prescription for this.  His one residual staple was removed.  He should remain out of work for another 4 weeks.  Follow-Up Instructions: Return in about 2 weeks (around 11/10/2018).   Ortho Exam  [name] alert, oriented, no adenopathy, well-dressed, normal affect, normal respiratory effort. The residual staple was removed from the incision line.  There is no sign of cellulitis or infection.  There is edema but it is gradually improving and we discussed downsizing to a smaller shrinker.  He has full knee extension and good flexion.  Imaging: No results found.    Labs: Lab Results  Component Value Date   HGBA1C 7.5 (H) 09/17/2018   HGBA1C 8.3 (H)  07/23/2018   HGBA1C 7.4 (H) 04/23/2018   ESRSEDRATE 31 (H) 02/14/2017   CRP 12.3 (H) 02/14/2017   REPTSTATUS 02/20/2017 FINAL 02/14/2017   REPTSTATUS 02/20/2017 FINAL 02/14/2017   CULT  02/14/2017    NO GROWTH 5 DAYS Performed at [facility], [address], [place], [place] [address]    CULT  02/14/2017    NO GROWTH 5 DAYS Performed at [facility], [address], [place], [place] [address]      Lab Results  Component Value Date   ALBUMIN 3.9 10/22/2018   ALBUMIN 3.0 (L) 09/22/2018   ALBUMIN 4.1 01/31/2018   PREALBUMIN 13.8 (L) 02/15/2017    Body mass index is 23.75 kg/m.  Orders:  No orders of the defined types were placed in this encounter.  Meds ordered this encounter  Medications  . oxyCODONE (OXY IR/ROXICODONE) 5 MG immediate release tablet    Sig: Take 1 tablet (5 mg total) by mouth every 6 (six) hours as needed for moderate pain (pain score 4-6).    Dispense:  30 tablet    Refill:  0     Procedures: No procedures performed  Clinical Data: No additional findings.  ROS:  All other systems negative, except as noted in the  HPI. Review of Systems  Objective: Vital Signs: Ht  (1.88 m)   Wt 185 lb (83.9 kg)   BMI 23.75 kg/m   Specialty Comments:  No specialty comments available.  PMFS History: Patient Active Problem List   Diagnosis Date Noted  . S/P BKA (below knee amputation) unilateral, right (HCC) 10/23/2018  . Intermittent pain   . Phantom limb pain (HCC)   . CKD (chronic kidney disease), stage III (HCC)   . Labile blood glucose   . Acute blood loss anemia   . Hypoalbuminemia due to protein-calorie malnutrition (HCC)   . Diabetes mellitus type 2 in nonobese (HCC)   . Postoperative pain   . Neuropathic pain   . Seizures (HCC)   . Right below-knee amputee (HCC) 09/19/2018  . Below knee amputation (HCC) 09/17/2018  . Gangrene of right foot (HCC)   . Status post transmetatarsal amputation of foot, right (HCC) 07/23/2018   . Osteomyelitis (HCC) 07/23/2018  . TIA (transient ischemic attack) 10/30/2017  . Medication monitoring encounter 03/28/2017  . Status post amputation of toe of right foot (HCC) 03/05/2017  . Subacute osteomyelitis, right ankle and foot (HCC)   . Peripheral neuropathy 02/16/2017  . Peripheral artery disease (HCC) 02/16/2017  . 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 Diabetic foot infection (HCC) 02/14/2017  . Type II diabetes mellitus with renal manifestations (HCC)   . HLD (hyperlipidemia)   . Tobacco abuse   . CKD stage 2 due to type 2 diabetes mellitus (HCC)   . CAD (coronary artery disease)   . Seizure disorder [facility])    Past Medical History:  Diagnosis Date  . CAD (coronary artery disease)   . CKD (chronic kidney disease), stage III (HCC)   . 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 History of DVT (deep vein thrombosis)   . History of kidney stones    passed stones - no surgery  . HLD (hyperlipidemia)   . Myocardial infarction (HCC) 2009  . Peripheral vascular disease (HCC)    right leg  . Seizure (HCC)    last one 2015  . Stroke [facility]) 2012, 10/2017   2012-speech was effected- has come back.10/2017- numbness of right side no taste right  side. 2012-  . Tobacco abuse     Family History  Problem Relation Age of Onset  . Diabetes Mellitus II Mother   . Diabetes Mellitus II Father     Past Surgical History:  Procedure Laterality Date  . AMPUTATION Right 02/23/2017   Procedure: RIGHT FOOT 5TH RAY AMPUTATION;  Surgeon: [name], MD;  Location: [facility] OR;  Service: Orthopedics;  Laterality: Right;  . AMPUTATION Right 04/23/2018   Procedure: Right Great Toe Amputation;  Surgeon: [name], MD;  Location: [facility] OR;  Service: Orthopedics;  Laterality: Right;  . AMPUTATION Right 07/23/2018   Procedure: RIGHT TRANSMETATARSAL AMPUTATION, APPLY WOUND VAC;  Surgeon: [name], MD;  [facility]: [facility] OR;  Service: Orthopedics;   Laterality: Right;  . AMPUTATION Right 09/17/2018   Procedure: RIGHT AMPUTATION BELOW KNEE;  Surgeon: [name], MD;  Location: [facility] OR;  Service: Orthopedics;  Laterality: Right;  . APPLICATION OF WOUND VAC Right 09/17/2018   Procedure: Application Of Wound Vac;  Surgeon: [name], MD;  Location: [facility] OR;  Service: Orthopedics;  Laterality: Right;  . BELOW KNEE LEG AMPUTATION Right 09/17/2018  . CORONARY ANGIOPLASTY  03/05/2008   stent  .  FOOT SURGERY    . PERIPHERAL ARTERIAL STENT GRAFT Right 2014   leg 2 stents- clotted per pat  . STENT PLACEMENT VASCULAR ([facility] HX)     Several in right leg  . TONSILLECTOMY    . TRANSMETATARSAL AMPUTATION Right 07/23/2018   Social History   Occupational History  . Not on file  Tobacco Use  . Smoking status: Light Tobacco Smoker    Packs/day: 0.25    Years: 15.00    Pack years: 3.75    Types: Cigarettes  . Smokeless tobacco: Never Used  Substance and Sexual Activity  . Alcohol use: Not Currently  . Drug use: No  . Sexual activity: Not on file

## 2018-11-07 ENCOUNTER — Telehealth: Payer: Self-pay | Admitting: Orthopedic Surgery

## 2018-11-07 NOTE — Telephone Encounter (Signed)
Patient requesting records from surgery done in March

## 2018-11-07 NOTE — Telephone Encounter (Signed)
Patient was called and was informed that he needs to sign a release form for his records and he can do this on appt day, patient understood

## 2018-11-07 NOTE — Telephone Encounter (Signed)
Ok, thank you patient has appt on 5/19 with [place] and he can get them then.

## 2018-11-11 ENCOUNTER — Other Ambulatory Visit: Payer: Self-pay

## 2018-11-11 ENCOUNTER — Encounter: Payer: Self-pay | Admitting: Physician Assistant

## 2018-11-11 ENCOUNTER — Ambulatory Visit (INDEPENDENT_AMBULATORY_CARE_PROVIDER_SITE_OTHER): Payer: Managed Care, Other (non HMO) | Admitting: Orthopedic Surgery

## 2018-11-11 ENCOUNTER — Encounter: Payer: Self-pay | Admitting: Orthopedic Surgery

## 2018-11-11 VITALS — Ht 74.0 in | Wt 185.0 lb

## 2018-11-11 DIAGNOSIS — E1142 Type 2 diabetes mellitus with diabetic polyneuropathy: Secondary | ICD-10-CM

## 2018-11-11 DIAGNOSIS — Z794 Long term (current) use of insulin: Secondary | ICD-10-CM

## 2018-11-11 DIAGNOSIS — N182 Chronic kidney disease, stage 2 (mild): Secondary | ICD-10-CM

## 2018-11-11 DIAGNOSIS — Z89511 Acquired absence of right leg below knee: Secondary | ICD-10-CM

## 2018-11-11 DIAGNOSIS — E1122 Type 2 diabetes mellitus with diabetic chronic kidney disease: Secondary | ICD-10-CM

## 2018-11-11 NOTE — Progress Notes (Signed)
Office Visit Note   Patient: Curtis Bradford           Date of Birth: 02/02/1961           MRN: [id number] Visit Date: 11/11/2018              Requested by: No referring provider defined for this encounter. PCP: Patient, No Pcp Per  Chief Complaint  Patient presents with  . Right Leg - Routine Post Op    09/17/18 right BKA       HPI: The patient is a 58 yo gentleman who was seen for post operative follow up following a right transtibial amputation on 09/17/2018. He has been wearing his shrinker stocking around the clock. He reports pain is improved.  He is working with [facility] for a prosthesis and has an appointment for fitting later today.  Assessment & Plan: Visit Diagnoses:  1. 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 Type 2 diabetes mellitus with diabetic polyneuropathy, with long-term current use of insulin (HCC)   3. CKD stage 2 due to type 2 diabetes mellitus (HCC)     Plan: Continue shrinker stocking around the clock. Continue working with [facility] for prosthetic fabrication.  Follow up in 3-4 weeks.  Should remain out of work for at least 4 more weeks.   Follow-Up Instructions: Return in about 4 weeks (around 12/09/2018).   Ortho Exam  [name] alert, oriented, no adenopathy, well-dressed, normal affect, normal respiratory effort. Right transtibial amputation site has small scab/slough laterally and this was debrided. No signs of infection or cellulitis.  Decreased edema. Full knee extension. Non tender to palpation.   Imaging: No results found.   Labs: Lab Results  Component Value Date   HGBA1C 7.5 (H) 09/17/2018   HGBA1C 8.3 (H) 07/23/2018   HGBA1C 7.4 (H) 04/23/2018   ESRSEDRATE 31 (H) 02/14/2017   CRP 12.3 (H) 02/14/2017   REPTSTATUS 02/20/2017 FINAL 02/14/2017   REPTSTATUS 02/20/2017 FINAL 02/14/2017   CULT  02/14/2017    NO GROWTH 5 DAYS Performed at [facility], [address] [place], [place] [address]    CULT  02/14/2017    NO GROWTH 5 DAYS Performed at [facility], [address], [place], [place] [address]      Lab Results  Component Value Date   ALBUMIN 3.9 10/22/2018   ALBUMIN 3.0 (L) 09/22/2018   ALBUMIN 4.1 01/31/2018   PREALBUMIN 13.8 (L) 02/15/2017    Body mass index is 23.75 kg/m.  Orders:  No orders of the defined types were placed in this encounter.  No orders of the defined types were placed in this encounter.    Procedures: No procedures performed  Clinical Data: No additional findings.  ROS:  All other systems negative, except as noted in the HPI. Review of Systems  Objective: Vital Signs: Ht 6\' 2"  (1.88 m)   Wt 185 lb (83.9 kg)   BMI 23.75 kg/m   Specialty Comments:  No specialty comments available.  PMFS History: Patient Active Problem List   Diagnosis Date Noted  . S/P BKA (below knee amputation) unilateral, right (HCC) 10/23/2018  . Intermittent pain   . Phantom limb pain (HCC)   . CKD (chronic kidney disease), stage III (HCC)   . Labile blood glucose   . Acute blood loss anemia   . Hypoalbuminemia due to protein-calorie malnutrition (HCC)   . Diabetes mellitus type 2 in nonobese (HCC)   .  Postoperative pain   . Neuropathic pain   . Seizures (HCC)   . Right below-knee amputee (HCC) 09/19/2018  . Below knee amputation (HCC) 09/17/2018  . Gangrene of right foot (HCC)   . Status post transmetatarsal amputation of foot, right (HCC) 07/23/2018  . Osteomyelitis (HCC) 07/23/2018  . TIA (transient ischemic attack) 10/30/2017  . Medication monitoring encounter 03/28/2017  . Status post amputation of toe of right foot (HCC) 03/05/2017  . Subacute osteomyelitis, right ankle and foot (HCC)   . Peripheral neuropathy 02/16/2017  . Peripheral artery disease (HCC) 02/16/2017  . 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 Diabetic foot infection (HCC) 02/14/2017  . Type II diabetes mellitus with renal  manifestations (HCC)   . HLD (hyperlipidemia)   . Tobacco abuse   . CKD stage 2 due to type 2 diabetes mellitus (HCC)   . CAD (coronary artery disease)   . Seizure disorder [facility](HCC)    Past Medical History:  Diagnosis Date  . CAD (coronary artery disease)   . CKD (chronic kidney disease), stage III (HCC)   . 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 History of DVT (deep vein thrombosis)   . History of kidney stones    passed stones - no surgery  . HLD (hyperlipidemia)   . Myocardial infarction (HCC) 2009  . Peripheral vascular disease (HCC)    right leg  . Seizure (HCC)    last one 2015  . Stroke [facility]) 2012, 10/2017   2012-speech was effected- has come back.10/2017- numbness of right side no taste right  side. 2012-  . Tobacco abuse     Family History  Problem Relation Age of Onset  . Diabetes Mellitus II Mother   . Diabetes Mellitus II Father     Past Surgical History:  Procedure Laterality Date  . AMPUTATION Right 02/23/2017   Procedure: RIGHT FOOT 5TH RAY AMPUTATION;  Surgeon: Nadara Mustarduda, Marcus V, MD;  [facility];  Service: Orthopedics;  Laterality: Right;  . AMPUTATION Right 04/23/2018   Procedure: Right Great Toe Amputation;  Surgeon: Nadara Mustarduda, Marcus V, MD;  [facility];  Service: Orthopedics;  Laterality: Right;  . AMPUTATION Right 07/23/2018   Procedure: RIGHT TRANSMETATARSAL AMPUTATION, APPLY WOUND VAC;  Surgeon: Nadara Mustarduda, Marcus V, MD;  [facility]: [facility] OR;  Service: Orthopedics;  Laterality: Right;  . AMPUTATION Right 09/17/2018   Procedure: RIGHT AMPUTATION BELOW KNEE;  Surgeon: Nadara Mustarduda, Marcus V, MD;  [facility];  Service: Orthopedics;  Laterality: Right;  . APPLICATION OF WOUND VAC Right 09/17/2018   Procedure: Application Of Wound Vac;  Surgeon: Nadara Mustarduda, Marcus V, MD;  [facility];  Service: Orthopedics;  Laterality: Right;  . BELOW KNEE LEG AMPUTATION Right 09/17/2018  . CORONARY ANGIOPLASTY  03/05/2008   stent  . FOOT SURGERY    . PERIPHERAL ARTERIAL  STENT GRAFT Right 2014   leg 2 stents- clotted per pat  . STENT PLACEMENT VASCULAR ([facility] HX)     Several in right leg  . TONSILLECTOMY    . TRANSMETATARSAL AMPUTATION Right 07/23/2018   Social History   Occupational History  . Not on file  Tobacco Use  . Smoking status: Light Tobacco Smoker    Packs/day: 0.25    Years: 15.00    Pack years: 3.75    Types: Cigarettes  . Smokeless tobacco: Never Used  Substance and Sexual Activity  . Alcohol use: Not Currently  . Drug use: No  .  Sexual activity: Not on file

## 2018-11-19 ENCOUNTER — Telehealth: Payer: Self-pay | Admitting: Physician Assistant

## 2018-11-19 NOTE — Telephone Encounter (Signed)
Pt request a call back from [facility] regarding his last office visit.

## 2018-11-20 NOTE — Telephone Encounter (Signed)
I called pt and he states that his paperwork from the [employer] was not received. I advised that yes this had been done and that I could refax it. He asked that I refax with his last office visit note. I advised that I sent the information with claim # [id number] to fax # [phone] he will call with any other questions.

## 2018-11-24 ENCOUNTER — Other Ambulatory Visit: Payer: Self-pay

## 2018-11-24 ENCOUNTER — Telehealth (INDEPENDENT_AMBULATORY_CARE_PROVIDER_SITE_OTHER): Payer: Managed Care, Other (non HMO) | Admitting: Gastroenterology

## 2018-11-24 ENCOUNTER — Encounter: Payer: Self-pay | Admitting: Gastroenterology

## 2018-11-24 VITALS — Ht 74.0 in | Wt 195.0 lb

## 2018-11-24 DIAGNOSIS — A0472 Enterocolitis due to Clostridium difficile, not specified as recurrent: Secondary | ICD-10-CM

## 2018-11-24 DIAGNOSIS — S88119A Complete traumatic amputation at level between knee and ankle, unspecified lower leg, initial encounter: Secondary | ICD-10-CM

## 2018-11-24 DIAGNOSIS — I2510 Atherosclerotic heart disease of native coronary artery without angina pectoris: Secondary | ICD-10-CM | POA: Diagnosis not present

## 2018-11-24 DIAGNOSIS — A09 Infectious gastroenteritis and colitis, unspecified: Secondary | ICD-10-CM

## 2018-11-24 DIAGNOSIS — N182 Chronic kidney disease, stage 2 (mild): Secondary | ICD-10-CM

## 2018-11-24 DIAGNOSIS — E1122 Type 2 diabetes mellitus with diabetic chronic kidney disease: Secondary | ICD-10-CM

## 2018-11-24 MED ORDER — VANCOMYCIN HCL 125 MG PO CAPS: 125.0000 mg | ORAL_CAPSULE | ORAL | 0 refills | Status: DC

## 2018-11-24 NOTE — Patient Instructions (Addendum)
If you are age 57 or older, your body mass index should be between 23-30. Your Body mass index is 25.04 kg/m. If this is out of the aforementioned range listed, please consider follow up with your Primary Care Provider.  If you are age 43 or younger, your body mass index should be between 19-25. Your Body mass index is 25.04 kg/m. If this is out of the aformentioned range listed, please consider follow up with your Primary Care Provider.   To help prevent the possible spread of infection to our patients, communities, and staff; we will be implementing the following measures:  As of now we are not allowing any visitors/family members to accompany you to any upcoming appointments with [facility]. If you have any concerns about this please contact our office to discuss prior to the appointment.   We have sent the following medications to your pharmacy for you to pick up at your convenience: Vancomycin 125mg  tablet Take 1 capsule by mouth four times daily for 2 weeks.  Take 1 capsule by mouth twice daily for 1 week.  Take 1 capsule by mouth once daily for 1 week. Then take 1 capsule by mouth every other day for 6 weeks. Then Stop.  Your provider has requested that you go to the basement level for lab work at our [place] location ([address] [place]. [place] [address]) . Press "B" on the elevator. The lab is located at the first door on the left as you exit the elevator. You may go at whatever time is convienent for you. The current hours of operations are Monday- Friday 7:30am-4:30pm.  Please call our office at [phone] to set up your 3 month follow up visit.  It was a pleasure to see you today!  Vito Cirigliano, D.O.

## 2018-11-24 NOTE — Progress Notes (Signed)
Chief Complaint: Diarrhea, C. difficile colitis  Referring Provider:     Alvira Monday, MD   HPI:    Due to current restrictions/limitations of in-office visits due to the COVID-19 pandemic, this scheduled clinical appointment was converted to a telehealth virtual consultation.   -The patient did consent to this virtual visit and is aware of possible charges through their insurance for this visit.  -Names of all parties present: Curtis Bradford (patient), Doristine Locks, DO, [facility] (physician) -Patient location: Home -Physician location: Office  Interactive audio and video telecommunications were attempted between this provider and patient, however failed, due to patient having technical difficulties OR patient did not have access to video capability. We continued and completed visit with audio only.  Curtis Bradford is a 58 y.o. male with a history of CAD, CKD, DM, DVT, nephrolithiasis, HLD, MI, PVD s/p right BKA (09/17/18), seizure, CVA referred to the Gastroenterology Clinic for evaluation of diarrhea.  He was seen in the ER on 10/22/2018 with c/o diarrhea since hospital discharge after right BKA on 09/17/2018.  Did describe some bright red blood tinges with 1 day of abdominal pain.  Did have antibiotics x3 weeks with recent prolonged hospitalization in 08/2018.  Evaluation notable for WBC 21.7, creatinine 1.4 (stable CKD) with o/w normal CMP, C. difficile positive, GI PCR panel negative, O&P negative, normal lipase.  Was started on vancomycin 125 mg qid x10 days.  Was scheduled for an appointment with Dr. Chales Abrahams later this week, but patient called today requesting earlier appointment.    Today he states sxs cleared with Vanco, and nearly returned to baseline by day 10. He started Probiotic, but states sxs have recurred since stopping probiotic. Back to watery, non-bloody diarrhea, having up to 3-4 stools/day.  No abdominal pain.  No hematochezia.  No nausea, vomiting, fever, chills.   Good p.o. intake.  Has an appointment later today with Orthopedics for prosthetic fitting.  Past medical history, past surgical history, social history, family history, medications, and allergies reviewed in the chart and with patient.    Past Medical History:  Diagnosis Date  . CAD (coronary artery disease)   . CKD (chronic kidney disease), stage III (HCC)   . 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 History of DVT (deep vein thrombosis)   . History of kidney stones    passed stones - no surgery  . HLD (hyperlipidemia)   . Myocardial infarction (HCC) 2009  . Peripheral vascular disease (HCC)    right leg  . Seizure (HCC)    last one 2015  . Stroke [facility]) 2012, 10/2017   2012-speech was effected- has come back.10/2017- numbness of right side no taste right  side. 2012-  . Tobacco abuse      Past Surgical History:  Procedure Laterality Date  . AMPUTATION Right 02/23/2017   Procedure: RIGHT FOOT 5TH RAY AMPUTATION;  Surgeon: [name], MD;  Location[facility] OR;  Service: Orthopedics;  Laterality: Right;  . AMPUTATION Right 04/23/2018   Procedure: Right Great Toe Amputation;  Surgeon: [name], MD;  Location: [facility] OR;  Service: Orthopedics;  Laterality: Right;  . AMPUTATION Right 07/23/2018   Procedure: RIGHT TRANSMETATARSAL AMPUTATION, APPLY WOUND VAC;  Surgeon: [name], MD;  [facility] OR;  Service: Orthopedics;  Laterality: Right;  . AMPUTATION Right 09/17/2018   Procedure: RIGHT AMPUTATION BELOW KNEE;  Surgeon: [name], MD;  [facility] OR;  Service: Orthopedics;  Laterality: Right;  . APPLICATION OF WOUND VAC Right 09/17/2018   Procedure: Application Of Wound Vac;  Surgeon: [name], MD;  Location[facility] OR;  Service: Orthopedics;  Laterality: Right;  . BELOW KNEE LEG AMPUTATION Right 09/17/2018  . CORONARY ANGIOPLASTY  03/05/2008   stent  . FOOT SURGERY    . PERIPHERAL ARTERIAL STENT GRAFT Right 2014   leg 2 stents- clotted per pat  .  STENT PLACEMENT VASCULAR ([facility] HX)     Several in right leg  . TONSILLECTOMY    . TRANSMETATARSAL AMPUTATION Right 07/23/2018   Family History  Problem Relation Age of Onset  . Diabetes Mellitus II Mother   . Diabetes Mellitus II Father   . Colon cancer Neg Hx   . Esophageal cancer Neg Hx    Social History   Tobacco Use  . Smoking status: Light Tobacco Smoker    Packs/day: 0.25    Years: 15.00    Pack years: 3.75    Types: Cigarettes  . Smokeless tobacco: Never Used  . Tobacco comment: 2 or 3 a day   Substance Use Topics  . Alcohol use: Not Currently  . Drug use: No   Current Outpatient Medications  Medication Sig Dispense Refill  . acetaminophen (TYLENOL) 325 MG tablet Take 1-2 tablets (325-650 mg total) by mouth every 6 (six) hours as needed for mild pain (pain score 1-3 or temp > 100.5).    Marland Kitchen aspirin EC 81 MG tablet Take 81 mg by mouth daily.     Marland Kitchen atorvastatin (LIPITOR) 20 MG tablet Take 1 tablet (20 mg total) by mouth daily. 90 tablet 3  . clopidogrel (PLAVIX) 75 MG tablet Take 1 tablet (75 mg total) by mouth at bedtime. 90 tablet 4  . Continuous Blood Gluc Sensor (FREESTYLE LIBRE 14 DAY SENSOR) MISC Place 1 patch onto the skin every 14 (fourteen) days. 6 each 2  . ezetimibe (ZETIA) 10 MG tablet Take 1 tablet (10 mg total) by mouth daily. 90 tablet 3  . Insulin Glargine (LANTUS) 100 UNIT/ML Solostar Pen Inject 27 Units into the skin at bedtime. (Patient taking differently: Inject 37 Units into the skin at bedtime. ) 15 mL 11  . levETIRAcetam (KEPPRA XR) 500 MG 24 hr tablet Take 1 tablet (500 mg total) by mouth daily. 90 tablet 3  . linagliptin (TRADJENTA) 5 MG TABS tablet Take 1 tablet (5 mg total) by mouth daily. 30 tablet 3  . oxyCODONE (OXY IR/ROXICODONE) 5 MG immediate release tablet Take 1 tablet (5 mg total) by mouth every 6 (six) hours as needed for moderate pain (pain score 4-6). 30 tablet 0   No current facility-administered medications for this visit.    No  Known Allergies   Review of Systems: All systems reviewed and negative except where noted in HPI.     Physical Exam:    Complete physical exam not completed due to the nature of this telehealth communication.     ASSESSMENT AND PLAN;   1) Diarrhea 2) C. difficile infection  58 year old male with multiple comorbidities with recent onset diarrhea following prolonged course for right BKA requiring prolonged course of antibiotics.  ER evaluation on 4/29 notable for positive C. difficile, appropriately treated with vancomycin 125 mg 4 times daily x10 days, initially with clinical improvement, now recurrence of index symptoms.  Not clear if this is incompletely eradicated versus first recurrence of C. difficile.  Plan to evaluate and treat as  below:  - Check CBC - Start vancomycin 125 mg p.o. with pulsed tapered course as below:    - QID x14 days    - BID x7 days    - Daily x7 days    - Every other day x6 weeks - Resume Align - RTC in 3 months or sooner as needed  3) Diabetes complicated by neuropathy with right BKA 4) CKD 2 5) CAD  I spent a total of 30 minutes of time with the patient. Greater than 50% of the time was spent counseling and coordinating care.     Blakely Maranan V Winnie Umali, DO, FACG  11/24/2018, 1:52 PM   No ref. provider found

## 2018-11-27 ENCOUNTER — Ambulatory Visit: Payer: Managed Care, Other (non HMO) | Admitting: Gastroenterology

## 2018-12-01 ENCOUNTER — Other Ambulatory Visit: Payer: Self-pay

## 2018-12-01 ENCOUNTER — Encounter
Payer: Managed Care, Other (non HMO) | Attending: Physical Medicine & Rehabilitation | Admitting: Physical Medicine & Rehabilitation

## 2018-12-01 ENCOUNTER — Encounter: Payer: Self-pay | Admitting: Physical Medicine & Rehabilitation

## 2018-12-01 VITALS — Ht 74.0 in | Wt 195.0 lb

## 2018-12-01 DIAGNOSIS — M792 Neuralgia and neuritis, unspecified: Secondary | ICD-10-CM

## 2018-12-01 DIAGNOSIS — Z89511 Acquired absence of right leg below knee: Secondary | ICD-10-CM | POA: Diagnosis not present

## 2018-12-01 DIAGNOSIS — G8918 Other acute postprocedural pain: Secondary | ICD-10-CM

## 2018-12-01 DIAGNOSIS — A0472 Enterocolitis due to Clostridium difficile, not specified as recurrent: Secondary | ICD-10-CM

## 2018-12-01 DIAGNOSIS — R269 Unspecified abnormalities of gait and mobility: Secondary | ICD-10-CM

## 2018-12-01 DIAGNOSIS — G546 Phantom limb syndrome with pain: Secondary | ICD-10-CM

## 2018-12-01 NOTE — Progress Notes (Signed)
Subjective:    Patient ID: Curtis Bradford, male    DOB: [birth year], 58 y.o.   MRN: [id number]  TELEHEALTH NOTE  Due to national recommendations of social distancing due to COVID 19, an audio/video telehealth visit is felt to be most appropriate for this patient at this time.  See Chart message from today for the patient's consent to telehealth from [place].     I verified that I am speaking with the correct person using two identifiers.  Location of patient: Home Location of provider: Office Method of communication: Telephone Names of participants : Zorita Pang scheduling, Marland Mcalpine obtaining consent and vitals if available Established patient Time spent on call: 13 minutes  HPI 58 year old right-handed male with history of CAD with stenting maintained on aspirin and Plavix, CKD stage III , diabetes mellitus, hypertension, CVA 2012, tobacco abuse, seizure disorder presents for follow-up for right BKA.  Last clinic visit on 10/23/2018.  Since that time, he is still in therapies. He is to get his prosthesis on Wed. He sees Surgery tomorrow.  Pain is relatively controlled. Using wheelchair for mobility.  Denies falls. Diarrhea has resolved for the most part.   Pain Inventory Average Pain 5 Pain Right Now 5 My pain is intermittent, sharp and throbbing  In the last 24 hours, has pain interfered with the following? General activity 0 Relation with others 0 Enjoyment of life 0 What TIME of day is your pain at its worst? evening Sleep (in general) Good  Pain is worse with: some activites Pain improves with: rest and medication Relief from Meds: 10  Mobility use a wheelchair needs help with transfers transfers alone  Function disabled: date disabled . I need assistance with the following:  meal prep, household duties and shopping  Neuro/Psych weakness trouble walking  Prior Studies Any changes since last visit?  yes  +c diff on  treatment as of 10/22/18  Physicians involved in your care Any changes since last visit?  no   Family History  Problem Relation Age of Onset  . Diabetes Mellitus II Mother   . Diabetes Mellitus II Father   . Colon cancer Neg Hx   . Esophageal cancer Neg Hx    Social History   Socioeconomic History  . Marital status: Single    Spouse name: Not on file  . Number of children: Not on file  . Years of education: Not on file  . Highest education level: Not on file  Occupational History  . Not on file  Social Needs  . Financial resource strain: Not on file  . Food insecurity:    Worry: Not on file    Inability: Not on file  . Transportation needs:    Medical: Not on file    Non-medical: Not on file  Tobacco Use  . Smoking status: Light Tobacco Smoker    Packs/day: 0.25    Years: 15.00    Pack years: 3.75    Types: Cigarettes  . Smokeless tobacco: Never Used  . Tobacco comment: 2 or 3 a day   Substance and Sexual Activity  . Alcohol use: Not Currently  . Drug use: No  . Sexual activity: Not on file  Lifestyle  . Physical activity:    Days per week: Not on file    Minutes per session: Not on file  . Stress: Not on file  Relationships  . Social connections:    Talks on phone: Not on file  Gets together: Not on file    Attends religious service: Not on file    Active member of club or organization: Not on file    Attends meetings of clubs or organizations: Not on file    Relationship status: Not on file  Other Topics Concern  . Not on file  Social History Narrative  . Not on file   Past Surgical History:  Procedure Laterality Date  . AMPUTATION Right 02/23/2017   Procedure: RIGHT FOOT 5TH RAY AMPUTATION;  Surgeon: Nadara Mustarduda, Marcus V, MD;  [facility];  Service: Orthopedics;  Laterality: Right;  . AMPUTATION Right 04/23/2018   Procedure: Right Great Toe Amputation;  Surgeon: Nadara Mustarduda, Marcus V, MD;  [facility];  Service: Orthopedics;  Laterality: Right;  .  AMPUTATION Right 07/23/2018   Procedure: RIGHT TRANSMETATARSAL AMPUTATION, APPLY WOUND VAC;  Surgeon: Nadara Mustarduda, Marcus V, MD;  Location: [facility] OR;  Service: Orthopedics;  Laterality: Right;  . AMPUTATION Right 09/17/2018   Procedure: RIGHT AMPUTATION BELOW KNEE;  Surgeon: Nadara Mustarduda, Marcus V, MD;  [facility];  Service: Orthopedics;  Laterality: Right;  . APPLICATION OF WOUND VAC Right 09/17/2018   Procedure: Application Of Wound Vac;  Surgeon: Nadara Mustarduda, Marcus V, MD;  [facility];  Service: Orthopedics;  Laterality: Right;  . BELOW KNEE LEG AMPUTATION Right 09/17/2018  . CORONARY ANGIOPLASTY  03/05/2008   stent  . FOOT SURGERY    . PERIPHERAL ARTERIAL STENT GRAFT Right 2014   leg 2 stents- clotted per pat  . STENT PLACEMENT VASCULAR ([facility] HX)     Several in right leg  . TONSILLECTOMY    . TRANSMETATARSAL AMPUTATION Right 07/23/2018   Past Medical History:  Diagnosis Date  . CAD (coronary artery disease)   . CKD (chronic kidney disease), stage III (HCC)   . 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 History of DVT (deep vein thrombosis)   . History of kidney stones    passed stones - no surgery  . HLD (hyperlipidemia)   . Myocardial infarction (HCC) 2009  . Peripheral vascular disease (HCC)    right leg  . Seizure (HCC)    last one 2015  . Stroke [facility](HCC) 2012, 10/2017   2012-speech was effected- has come back.10/2017- numbness of right side no taste right  side. 2012-  . Tobacco abuse    Ht 6\' 2"  (1.88 m)   Wt 195 lb (88.5 kg)   BMI 25.04 kg/m   Opioid Risk Score:   Fall Risk Score:  `1  Depression screen PHQ 2/9  [facility] 10/23/2018 04/11/2017 03/28/2017  Decreased Interest 0 0 0  Down, Depressed, Hopeless 0 0 0  PHQ - 2 Score 0 0 0   Review of Systems  Constitutional: Negative.   HENT: Negative.   Eyes: Negative.   Respiratory: Negative.   Cardiovascular: Negative.   Gastrointestinal: Negative.        C diff  Endocrine: Negative.    Genitourinary: Negative.   Musculoskeletal: Positive for gait problem.  Skin: Negative.   Allergic/Immunologic: Negative.   Neurological: Positive for weakness.  Hematological: Negative.        Plavix  Psychiatric/Behavioral: Negative.   All other systems reviewed and are negative.      Objective:   Physical Exam Gen: NAD. Pulm: Effort normal Neuro: Alert and oriented    Assessment & Plan:  58 year old right-handed male with history of CAD with stenting maintained on aspirin and Plavix, CKD  stage III , diabetes mellitus, hypertension, CVA 2012, tobacco abuse, seizure disorder presents for hospital follow up for right BKA.  1.  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 Pain Management:   Patient states he has Gabapentin, encouraged to take  3. C. Diff  Cont oral Vanc  4. Gait abnormality  Cont walker/wheelchair for safety  Cont therapies

## 2018-12-02 ENCOUNTER — Other Ambulatory Visit: Payer: Self-pay

## 2018-12-02 ENCOUNTER — Encounter: Payer: Self-pay | Admitting: Orthopedic Surgery

## 2018-12-02 ENCOUNTER — Ambulatory Visit (INDEPENDENT_AMBULATORY_CARE_PROVIDER_SITE_OTHER): Payer: Managed Care, Other (non HMO) | Admitting: Orthopedic Surgery

## 2018-12-02 VITALS — Ht 74.0 in | Wt 195.0 lb

## 2018-12-02 DIAGNOSIS — S88119A Complete traumatic amputation at level between knee and ankle, unspecified lower leg, initial encounter: Secondary | ICD-10-CM

## 2018-12-02 DIAGNOSIS — Z89511 Acquired absence of right leg below knee: Secondary | ICD-10-CM

## 2018-12-02 NOTE — Progress Notes (Signed)
Post-Op Visit Note   Patient: Curtis Bradford           Date of Birth: 05-19-1961           MRN: [id number] Visit Date: 12/02/2018 PCP: Patient, No Pcp Per  Chief Complaint:  Chief Complaint  Patient presents with  . Right Leg - Routine Post Op    09/17/18 right BKA     HPI:  HPI The patient is a 58 year old gentleman seen today status post right below the knee amputation on March 25.  He states his prosthesis will be ready for pickup on Friday he is eager to get back to ambulating.  Ortho Exam On examination there is a 5 mm in diameter to the lateral aspect of his incision this is covered with eschar there is no surrounding erythema no drainage no odor.  The limb is consolidating well.  Visit Diagnoses:  1. Below knee amputation [facility](HCC)     Plan: Continue with daily incisional care.  Mupirocin dressing changes.  Follow with [facility] for prosthesis needs.  He will begin therapy shortly.  Provided a note for work he will follow-up in the office here in 2 months.  Follow-Up Instructions: Return in about 2 months (around 02/01/2019).   Imaging: No results found.  Orders:  No orders of the defined types were placed in this encounter.  No orders of the defined types were placed in this encounter.    PMFS History: Patient Active Problem List   Diagnosis Date Noted  . S/P BKA (below knee amputation) unilateral, right (HCC) 10/23/2018  . Intermittent pain   . Phantom limb pain (HCC)   . CKD (chronic kidney disease), stage III (HCC)   . Labile blood glucose   . Acute blood loss anemia   . Hypoalbuminemia due to protein-calorie malnutrition (HCC)   . Diabetes mellitus type 2 in nonobese (HCC)   . Postoperative pain   . Neuropathic pain   . Seizures (HCC)   . Right below-knee amputee (HCC) 09/19/2018  . Below knee amputation (HCC) 09/17/2018  . Status post transmetatarsal amputation of foot, right (HCC) 07/23/2018  . Osteomyelitis (HCC) 07/23/2018  . TIA (transient ischemic  attack) 10/30/2017  . Medication monitoring encounter 03/28/2017  . Status post amputation of toe of right foot (HCC) 03/05/2017  . Subacute osteomyelitis, right ankle and foot (HCC)   . Peripheral neuropathy 02/16/2017  . Peripheral artery disease (HCC) 02/16/2017  . 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 Diabetic foot infection (HCC) 02/14/2017  . Type II diabetes mellitus with renal manifestations (HCC)   . HLD (hyperlipidemia)   . Tobacco abuse   . CKD stage 2 due to type 2 diabetes mellitus (HCC)   . CAD (coronary artery disease)   . Seizure disorder [facility](HCC)    Past Medical History:  Diagnosis Date  . CAD (coronary artery disease)   . CKD (chronic kidney disease), stage III (HCC)   . 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 History of DVT (deep vein thrombosis)   . History of kidney stones    passed stones - no surgery  . HLD (hyperlipidemia)   . Myocardial infarction (HCC) 2009  . Peripheral vascular disease (HCC)    right leg  . Seizure (HCC)    last one 2015  . Stroke [facility](HCC) 2012, 10/2017   2012-speech was effected- has come back.10/2017- numbness of right side no taste right  side. 2012-  .  Tobacco abuse     Family History  Problem Relation Age of Onset  . Diabetes Mellitus II Mother   . Diabetes Mellitus II Father   . Colon cancer Neg Hx   . Esophageal cancer Neg Hx     Past Surgical History:  Procedure Laterality Date  . AMPUTATION Right 02/23/2017   Procedure: RIGHT FOOT 5TH RAY AMPUTATION;  Surgeon: Newt Minion, MD;  Location: [place];  Service: Orthopedics;  Laterality: Right;  . AMPUTATION Right 04/23/2018   Procedure: Right Great Toe Amputation;  Surgeon: Newt Minion, MD;  Location: [place];  Service: Orthopedics;  Laterality: Right;  . AMPUTATION Right 07/23/2018   Procedure: RIGHT TRANSMETATARSAL AMPUTATION, APPLY WOUND VAC;  Surgeon: Newt Minion, MD;  Location: [place];  Service: Orthopedics;  Laterality:  Right;  . AMPUTATION Right 09/17/2018   Procedure: RIGHT AMPUTATION BELOW KNEE;  Surgeon: Newt Minion, MD;  Location: [place];  Service: Orthopedics;  Laterality: Right;  . APPLICATION OF WOUND VAC Right 09/17/2018   Procedure: Application Of Wound Vac;  Surgeon: Newt Minion, MD;  Location: [place];  Service: Orthopedics;  Laterality: Right;  . BELOW KNEE LEG AMPUTATION Right 09/17/2018  . CORONARY ANGIOPLASTY  03/05/2008   stent  . FOOT SURGERY    . PERIPHERAL ARTERIAL STENT GRAFT Right 2014   leg 2 stents- clotted per pat  . STENT PLACEMENT VASCULAR ([place] HX)     Several in right leg  . TONSILLECTOMY    . TRANSMETATARSAL AMPUTATION Right 07/23/2018   Social History   Occupational History  . Not on file  Tobacco Use  . Smoking status: Light Tobacco Smoker    Packs/day: 0.25    Years: 15.00    Pack years: 3.75    Types: Cigarettes  . Smokeless tobacco: Never Used  . Tobacco comment: 2 or 3 a day   Substance and Sexual Activity  . Alcohol use: Not Currently  . Drug use: No  . Sexual activity: Not on file

## 2018-12-03 ENCOUNTER — Telehealth: Payer: Self-pay

## 2018-12-03 NOTE — Telephone Encounter (Signed)
Link text to pts cell number for visit on 12/10/2018.

## 2018-12-03 NOTE — Telephone Encounter (Signed)
I called pt that due to COVID 19 the visit on June 17 will be change to virtual video visit. Pt gave verbal consent to do video and to file insurance. I explain the doxy process and pt has a smart phone with [employer]. I stated link will be text today. Pt knows to click link 10 minutes prior to visit. He verbalized understanding.

## 2018-12-09 NOTE — Telephone Encounter (Signed)
Tried to call pt to update meds. Unable to leave message, vm full.

## 2018-12-10 ENCOUNTER — Encounter: Payer: Self-pay | Admitting: Adult Health

## 2018-12-10 ENCOUNTER — Other Ambulatory Visit: Payer: Self-pay

## 2018-12-10 ENCOUNTER — Ambulatory Visit (INDEPENDENT_AMBULATORY_CARE_PROVIDER_SITE_OTHER): Payer: Managed Care, Other (non HMO) | Admitting: Adult Health

## 2018-12-10 DIAGNOSIS — E785 Hyperlipidemia, unspecified: Secondary | ICD-10-CM | POA: Diagnosis not present

## 2018-12-10 DIAGNOSIS — Z794 Long term (current) use of insulin: Secondary | ICD-10-CM

## 2018-12-10 DIAGNOSIS — I10 Essential (primary) hypertension: Secondary | ICD-10-CM | POA: Diagnosis not present

## 2018-12-10 DIAGNOSIS — E1122 Type 2 diabetes mellitus with diabetic chronic kidney disease: Secondary | ICD-10-CM

## 2018-12-10 DIAGNOSIS — I6381 Other cerebral infarction due to occlusion or stenosis of small artery: Secondary | ICD-10-CM

## 2018-12-10 DIAGNOSIS — I639 Cerebral infarction, unspecified: Secondary | ICD-10-CM

## 2018-12-10 DIAGNOSIS — N182 Chronic kidney disease, stage 2 (mild): Secondary | ICD-10-CM

## 2018-12-10 NOTE — Progress Notes (Signed)
I agree with the above plan 

## 2018-12-10 NOTE — Progress Notes (Signed)
[facility] [address] [place]. [place] [id number] [phone]       FOLLOW UP NOTE  [name] Date of Birth:  Jan 30, 1961 Medical Record Number:  [id number]   Reason for visit: Stroke follow up  Virtual Visit via Video Note  I connected with Curtis Bradford on 12/10/18 at  7:45 AM EDT by a video enabled telemedicine application located remotely in my own home and verified that I am speaking with the correct person using two identifiers who was located at their own home.   I discussed the limitations of evaluation and management by telemedicine and the availability of in person appointments. The patient expressed understanding and agreed to proceed.    CHIEF COMPLAINT:  Chief Complaint  Patient presents with  . Follow-up    Stroke follow-up    HPI:  Initial visit 12/03/2017: Curtis Bradford is being seen today in the office for left thalamic infarct on 10/30/2017. History obtained from patient and chart review. Reviewed all radiology images and labs personally.  Patient 58 year-old male with history of CAD/MI in 2009, CKD, DM, current smoker, PVD with multiple stents, HLD, one-time seizure due to metformin overdose, left MCA stroke in 2012 who was admitted for right-sided numbness for 4 days.  CT head showed left parietal MCA old infarct, left thalamic subacute infarct.  MRI showed acute left thalamic infarct and old left MCA and right thalamus infarct.  MRA unremarkable.  CTA neck with mild arthrosclerosis but no significant stenosis.  2D echo showed an EF of 55 to 60%.  LDL satisfactory at 38.  A1c elevated at 9.9.  Patient had stroke in 2012 with sudden onset aphasia.  Admitted to hospital in [place].    It was unsure about a stroke work-up over there, however patient stated that he was not told any etiology for the stroke.  It took him 6 months to get speech back.  He also had MI in 2009, and multiple lower extremity stent for PVD. In 2016, patient did have  one seizure after accidentally overdosing on metformin and since discontinuation of metformin, he denies seizure activity but has continued on Keppra.  Patient does have a history of right leg DVT 5 years ago and eventually received 2 stents that were subsequently clogged, unclogged, and clogged then additional 3 stents were placed/replaced.  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  Given history of embolic pattern stroke in 2012, it was decided to further work-up with a TCD bubble study which was negative for PFO, lower extremity venous Dopplers were negative for DVT and hypercoagulable work-up negative.  Recommended increasing Lipitor 20 mg to 80 mg for cholesterol control.  Also recommended close outpatient follow-up with PCP regarding elevated A1c level.  Patient was advised to quit smoking as he is an active smoker.  Patient discharged home in stable condition.    12/03/2017 visit: [name] being seen today for hospital follow-up.  He continues to have complaints of right-sided numbness along with facial numbness but has been improving.  He states when he first was discharged from the hospital he was unable to pull something out of his pocket as he was unable to feel anything but now he is able to see on object and pull it out.  He continues to take both aspirin and Plavix with mild bruising but no bleeding.  Continues to take Lipitor  without side effects of myalgias.  Blood pressure satisfactory 121/82.Patient has returned to all previous activities including work and driving.   He does have recent complaint of ringing sensation in his right ear along with coughing spells that has been occurring for approximately 2 weeks with mild postnasal drip.  He is using Mucinex DM which has been helping some.   Patient moved to the [place] area from [place] last October and has not found a PCP at this time.   Patient does have many medications for diabetic control.  Denies new or worsening stroke/TIA symptoms.  04/09/2018 visit: Patient returns today for scheduled follow-up visit.  He has been doing well from a stroke standpoint without residual deficits or recurring of symptoms.  He has continued taking both aspirin and Plavix without side effects of bleeding or bruising.  Continues to take Lipitor without side effects myalgias.  He was seen by endocrinology who did lab work on 01/31/2018 which showed LDL 22, cholesterol 86, triglycerides 200 and HDL 23.9.  Also A1c obtained which showed 6.5.  He continues to work without complication and continues to do all prior activities.  Blood pressure today 140/70.  Referral was placed for nephrology as well as internal medicine for PCP establishment but has not received a call from either offices.  Patient was provided with list of PCP offices in the [place] area along with phone number to contact nephrology due to continued elevated creatinine level.  Patient denies any recent seizure activity and continues to be compliant with Keppra thousand milligrams nightly.  Patient questioning whether this can start to be tapered off.  No further concerns at this time.  Denies new or worsening stroke/TIA symptoms.  Interval history 06/10/18: [name] being seen today for 6142-month follow-up visit.  Patient did undergo right great toe amputation on 04/23/2018. He has had some complications including blistering due to continuing to work and was off from work for 1 weeks time. Last night was his first night back to work.  He does endorse some pain and swelling but is able to tolerate. He has decreased lipitor and started on Zetia. He denies any side effects. He continues on aspirin and plavix without side effects of bleeding or bruising. He has not followed up with nephrology due to a lot going on with his recent toe amputation.  He has had recent lab work which showed improvement of  his creatinine at 1.54 which decreased from 1.81.  He does state he has been having a difficult time controlling glucose levels since his amputation which have been fluctuating and he does continue to follow with endocrinology for management. He does continue on keppra 500mg  without any recent seizure activity. He continues to have numbness and tingling on right upper and lower extremity which has been present since his stroke. Denies nerve pain associated. No further concerns at this time. Denies new or worsening stroke/TIA symptoms.   12/10/2018 VIRTUAL VISIT: Curtis Bradford returns today for 3265-month follow-up regarding left thalamic infarct in 10/2017.  He has been stable from a stroke standpoint without new or worsening stroke/TIA symptoms.  Residual deficit of mild right upper and lower extremity numbness.  He continues on aspirin 81 mg and clopidogrel 75 mg daily without side effects of bleeding or bruising.  Continues on atorvastatin without side effects of myalgias.  Prior lipid panel showed LDL 2 but triglycerides increased to 333 and was started on Zetia.  He has not had repeat lab work since this time.  Recent A1c 7.5.  Continues on Keppra 500 mg daily without additional seizure activity. 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  He has since obtained a prosthetic last week and plans on returning to company to make adjustments this afternoon. He was able to use for 8-9 hours yesterday but removed due to pain. He is able to ambulate with a crutch. He has returned to work on light duty without difficulty.  He continues to work with therapy.  No further concerns at this time.  Denies new or worsening stroke/TIA symptoms.     ROS:   14 system review of systems performed and negative with exception of numbness, amputation and ambulation difficulty  PMH:  Past Medical  History:  Diagnosis Date  . CAD (coronary artery disease)   . CKD (chronic kidney disease), stage III ([facility])   . 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 History of DVT (deep vein thrombosis)   . History of kidney stones    passed stones - no surgery  . HLD (hyperlipidemia)   . Myocardial infarction ([place]) 2009  . Peripheral vascular disease ([facility])    right leg  . Seizure ([facility])    last one 2015  . Stroke [facility]) 2012, 10/2017   2012-speech was effected- has come back.10/2017- numbness of right side no taste right  side. 2012-  . Tobacco abuse     PSH:  Past Surgical History:  Procedure Laterality Date  . AMPUTATION Right 02/23/2017   Procedure: RIGHT FOOT 5TH RAY AMPUTATION;  Surgeon: Newt Minion, MD;  Location: [place] [facility];  Service: Orthopedics;  Laterality: Right;  . AMPUTATION Right 04/23/2018   Procedure: Right Great Toe Amputation;  Surgeon: Newt Minion, MD;  Location: [facility];  Service: Orthopedics;  Laterality: Right;  . AMPUTATION Right 07/23/2018   Procedure: RIGHT TRANSMETATARSAL AMPUTATION, APPLY WOUND VAC;  Surgeon: Newt Minion, MD;  Location: [facility];  Service: Orthopedics;  Laterality: Right;  . AMPUTATION Right 09/17/2018   Procedure: RIGHT AMPUTATION BELOW KNEE;  Surgeon: Newt Minion, MD;  Location: [facility];  Service: Orthopedics;  Laterality: Right;  . APPLICATION OF WOUND VAC Right 09/17/2018   Procedure: Application Of Wound Vac;  Surgeon: Newt Minion, MD;  Location: [facility];  Service: Orthopedics;  Laterality: Right;  . BELOW KNEE LEG AMPUTATION Right 09/17/2018  . CORONARY ANGIOPLASTY  03/05/2008   stent  . FOOT SURGERY    . PERIPHERAL ARTERIAL STENT GRAFT Right 2014   leg 2 stents- clotted per pat  . STENT PLACEMENT VASCULAR ([facility])     Several in right leg  . TONSILLECTOMY    . TRANSMETATARSAL AMPUTATION Right 07/23/2018    Social History:  Social History   Socioeconomic History  . Marital status: Single    Spouse name: Not on file   . Number of children: Not on file  . Years of education: Not on file  . Highest education level: Not on file  Occupational History  . Not on file  Social Needs  . Financial resource strain: Not on file  . Food insecurity    Worry: Not on file    Inability: Not on file  . Transportation needs    Medical: Not on file    Non-medical: Not on file  Tobacco Use  . Smoking status: Light Tobacco Smoker    Packs/day:  0.25    Years: 15.00    Pack years: 3.75    Types: Cigarettes  . Smokeless tobacco: Never Used  . Tobacco comment: 2 or 3 a day   Substance and Sexual Activity  . Alcohol use: Not Currently  . Drug use: No  . Sexual activity: Not on file  Lifestyle  . Physical activity    Days per week: Not on file    Minutes per session: Not on file  . Stress: Not on file  Relationships  . Social Musician on phone: Not on file    Gets together: Not on file    Attends religious service: Not on file    Active member of club or organization: Not on file    Attends meetings of clubs or organizations: Not on file    Relationship status: Not on file  . Intimate partner violence    Fear of current or ex partner: Not on file    Emotionally abused: Not on file    Physically abused: Not on file    Forced sexual activity: Not on file  Other Topics Concern  . Not on file  Social History Narrative  . Not on file    Family History:  Family History  Problem Relation Age of Onset  . Diabetes Mellitus II Mother   . Diabetes Mellitus II Father   . Colon cancer Neg Hx   . Esophageal cancer Neg Hx     Medications:   Current Outpatient Medications on File Prior to Visit  Medication Sig Dispense Refill  . acetaminophen (TYLENOL) 325 MG tablet Take 1-2 tablets (325-650 mg total) by mouth every 6 (six) hours as needed for mild pain (pain score 1-3 or temp > 100.5).    Marland Kitchen aspirin EC 81 MG tablet Take 81 mg by mouth daily.     Marland Kitchen atorvastatin (LIPITOR) 20 MG tablet Take 1 tablet  (20 mg total) by mouth daily. 90 tablet 3  . clopidogrel (PLAVIX) 75 MG tablet Take 1 tablet (75 mg total) by mouth at bedtime. 90 tablet 4  . Continuous Blood Gluc Sensor (FREESTYLE LIBRE 14 DAY SENSOR) MISC Place 1 patch onto the skin every 14 (fourteen) days. 6 each 2  . ezetimibe (ZETIA) 10 MG tablet Take 1 tablet (10 mg total) by mouth daily. 90 tablet 3  . Insulin Glargine (LANTUS) 100 UNIT/ML Solostar Pen Inject 27 Units into the skin at bedtime. (Patient taking differently: Inject 37 Units into the skin at bedtime. ) 15 mL 11  . levETIRAcetam (KEPPRA XR) 500 MG 24 hr tablet Take 1 tablet (500 mg total) by mouth daily. 90 tablet 3  . linagliptin (TRADJENTA) 5 MG TABS tablet Take 1 tablet (5 mg total) by mouth daily. 30 tablet 3  . oxyCODONE (OXY IR/ROXICODONE) 5 MG immediate release tablet Take 1 tablet (5 mg total) by mouth every 6 (six) hours as needed for moderate pain (pain score 4-6). 30 tablet 0  . vancomycin (VANCOCIN) 125 MG capsule Take 1 capsule (125 mg total) by mouth as directed. Take 1 capsule by mouth four times daily for 2 weeks.  Take 1 capsule by mouth twice daily for 1 week.  Take 1 capsule by mouth once daily for 1 week. Then take 1 capsule by mouth every other day for 6 weeks. Then Stop. 98 capsule 0   No current facility-administered medications on file prior to visit.     Allergies:  No Known Allergies  Physical Exam  General: well developed, well nourished, pleasant middle-age Caucasian male, seated, in no evident distress Head: head normocephalic and atraumatic.   Musculoskeletal: Right BKA amputation  Neurologic Exam Mental Status: Awake and fully alert. Oriented to place and time. Recent and remote memory intact. Attention span, concentration and fund of knowledge appropriate. Mood and affect appropriate.  Cranial Nerves: Extraocular movements full without nystagmus. Hearing intact to voice. Facial sensation intact. Face, tongue, palate moves normally and  symmetrically.  Shoulder shrug symmetric. Motor: No evidence of weakness per drift assessment Sensory.:  Decreased light touch sensation in right upper and lower extremity Coordination: Rapid alternating movements normal in all extremities. Finger-to-nose performed accurately bilaterally.  Unable to perform heel-to-shin due to right BKA Gait and Station: Gait assessment deferred Reflexes: UTA    Diagnostic Data (Labs, Imaging, Testing)  CT HEAD WO CONTRAST 10/30/2017 IMPRESSION: Old left parietal and thalamic infarcts without acute intracranial Abnormality.  MR BRAIN WO CONTRAST MR MRA HEAD WO CONTRAST 10/31/2017 IMPRESSION: MRI HEAD IMPRESSION 1. 9 mm acute ischemic nonhemorrhagic left thalamic lacunar infarct. 2. Remote left posterior MCA territory infarct, with additional remote lacunar infarcts involving the bilateral thalami and right basal ganglia. 3. Atrophy with mild chronic small vessel ischemic disease. MRA HEAD IMPRESSION 1. Negative intracranial MRA. No large vessel occlusion. No hemodynamically significant or correctable stenosis. 2. Minor atherosclerotic change for age.  CT ANGIO NECK W OR WO CONTRAST 10/31/2017 IMPRESSION: 1. Atherosclerosis without flow limiting stenosis or ulceration. 2. 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  ECHOCARDIOGRAM 10/31/17 Study Conclusions - Left ventricle: The cavity size was normal. Systolic function was   normal. The estimated ejection fraction was in the range of 55%   to 60%. Hypokinesis of the apical myocardium. Doppler parameters   are consistent with abnormal left ventricular relaxation (grade 1   diastolic dysfunction). - Aortic valve: Trileaflet; mildly thickened, mildly calcified   leaflets. - Aortic root: The aortic root was mildly dilated. - Mitral valve: Valve area by pressure half-time: 2.44 cm^2.  VAS [place] LOWER EXTREMITY VENOUS BILAT (DVT) 11/01/17 Final Interpretation: Right: There is no evidence of deep vein  thrombosis in the lower extremity. No cystic structure found in the popliteal fossa. Left: There is no evidence of deep vein thrombosis in the lower extremity. No cystic structure found in the popliteal fossa.  VAS [place] TRANSCRANIAL DOPPLER WITH BUBBLES 11/01/17 There is no obvious evidence of high intensity transient signals (HITS), therefore there is no evidence of patent foramen ovale (PFO). Artifact was seen with valsalva maneuver, possible muscular interference.     ASSESSMENT: Corky Blumstein is a 58 y.o. year old male here with acute left thalamic infarct on 10/30/17 secondary to small vessel disease. Vascular risk factors include CKD, DM, PVD, HLD and HTN.  He has been stable from a stroke standpoint with residual mild right upper and lower extremity numbness with decreased sensation.  Recently underwent right BKA without complication.   PLAN: -Continue aspirin 81 mg daily and clopidogrel 75 mg daily  and atorvastatin 20 mg and Zetia 10 mg for secondary stroke prevention -Repeat lipid panel with possible need of HLD management adjustments -Continue Keppra XR 500 mg nightly for seizure prophylaxis -Continue to work with physical therapy and follow with orthopedics in regards to recent BKA -continue to work with  -continue to monitor BP at home -Advised to continue to stay active and maintain a healthy diet -Maintain strict control of hypertension with blood pressure goal below 130/90, diabetes with hemoglobin A1c  goal below 6.5% and cholesterol with LDL cholesterol (bad cholesterol) goal below 70 mg/dL. I also advised the patient to eat a healthy diet with plenty of whole grains, cereals, fruits and vegetables, exercise regularly and maintain ideal body weight.  Follow-up in 1 year for medication management regarding Keppra as he has been stable from a stroke standpoint   Greater than 50% of time during this 15 minute non-face-to-face visit was spent on counseling,explanation of diagnosis of  left thalamic infarct, reviewing risk factor management of CKD, DM, PVD, HLD and HTN, planning of further management, discussion with patient and family and coordination of care   George HughJessica VanSchaick, [facility] CenterGNP-BC  [facility] [address] [place], [place] [id number]  Phone [phone] Fax [phone]

## 2018-12-16 ENCOUNTER — Other Ambulatory Visit: Payer: Self-pay

## 2018-12-16 ENCOUNTER — Encounter: Payer: Self-pay | Admitting: Orthopedic Surgery

## 2018-12-16 ENCOUNTER — Ambulatory Visit (INDEPENDENT_AMBULATORY_CARE_PROVIDER_SITE_OTHER): Payer: Managed Care, Other (non HMO) | Admitting: Physician Assistant

## 2018-12-16 VITALS — Ht 74.0 in | Wt 195.0 lb

## 2018-12-16 DIAGNOSIS — E1142 Type 2 diabetes mellitus with diabetic polyneuropathy: Secondary | ICD-10-CM

## 2018-12-16 DIAGNOSIS — N182 Chronic kidney disease, stage 2 (mild): Secondary | ICD-10-CM

## 2018-12-16 DIAGNOSIS — Z794 Long term (current) use of insulin: Secondary | ICD-10-CM

## 2018-12-16 DIAGNOSIS — Z89511 Acquired absence of right leg below knee: Secondary | ICD-10-CM

## 2018-12-16 DIAGNOSIS — E1122 Type 2 diabetes mellitus with diabetic chronic kidney disease: Secondary | ICD-10-CM

## 2018-12-16 NOTE — Progress Notes (Signed)
Office Visit Note   Patient: Curtis DimesJohn Amspacher           Date of Birth: 01-20-1961           MRN: [id number] Visit Date: 12/16/2018              Requested by: No referring provider defined for this encounter. PCP: Patient, No Pcp Per  Chief Complaint  Patient presents with  . Right Leg - Routine Post Op    09/17/18 right BKA       HPI: The patient is a 58 year old gentleman who is seen for follow-up of his right transtibial amputation on 09/17/2018.  He obtained a prosthetic from [facility] and reports that sometimes he is wearing it up to 10 to 12 hours a daily.  He feels like he is bottoming out and is going to [facility] later today to see about some adjustments.  He does feel some mild tenderness over the distal residual limb but notes no breakdown.  He is getting some rash over the upper thigh from his liner and we discussed wearing his medical stump shrinker to help with this.  Assessment & Plan: Visit Diagnoses:  1. 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 Type 2 diabetes mellitus with diabetic polyneuropathy, with long-term current use of insulin (HCC)   3. CKD stage 2 due to type 2 diabetes mellitus (HCC)     Plan: Follow-up with [facility] for adjustments to prosthetic as he feels like he is bottoming out some.  Recommended he wear his Vive medical shrinker stocking to help with the slight rash over the thigh. He is able to continue working but with restrictions of limited walking and standing at this time. He will follow-up in 3 weeks or sooner should he have difficulty in the interim.  Follow-Up Instructions: Return in about 3 weeks (around 01/06/2019).   Ortho Exam  [name] alert, oriented, no adenopathy, well-dressed, normal affect, normal respiratory effort. The right transtibial amputation site is well-healed.  He does have a slight pressure area over the medial distal tibia area without evidence for breakdown.  There is no evidence  for hematoma seroma over the distal residual limb.  No signs of cellulitis or infection.  He has full knee extension.  He does have some slight rash over the upper thigh area and again we discussed utilizing his medical shrinker stocking to help with this.  Imaging: No results found.   Labs: Lab Results  Component Value Date   HGBA1C 7.5 (H) 09/17/2018   HGBA1C 8.3 (H) 07/23/2018   HGBA1C 7.4 (H) 04/23/2018   ESRSEDRATE 31 (H) 02/14/2017   CRP 12.3 (H) 02/14/2017   REPTSTATUS 02/20/2017 FINAL 02/14/2017   REPTSTATUS 02/20/2017 FINAL 02/14/2017   CULT  02/14/2017    NO GROWTH 5 DAYS Performed at [facility], [address] [place], [place] [address]    CULT  02/14/2017    NO GROWTH 5 DAYS Performed at [facility], [address] [place], [place] [address]      Lab Results  Component Value Date   ALBUMIN 3.9 10/22/2018   ALBUMIN 3.0 (L) 09/22/2018   ALBUMIN 4.1 01/31/2018   PREALBUMIN 13.8 (L) 02/15/2017    Body mass index is 25.04 kg/m.  Orders:  No orders of the defined types were placed in this encounter.  No orders of the defined types were placed in this encounter.    Procedures: No  procedures performed  Clinical Data: No additional findings.  ROS:  All other systems negative, except as noted in the HPI. Review of Systems  Objective: Vital Signs: Ht 6\' 2"  (1.88 m)   Wt 195 lb (88.5 kg)   BMI 25.04 kg/m   Specialty Comments:  No specialty comments available.  PMFS History: Patient Active Problem List   Diagnosis Date Noted  . S/P BKA (below knee amputation) unilateral, right ([place]) 10/23/2018  . Intermittent pain   . Phantom limb pain ([place])   . CKD (chronic kidney disease), stage III ([place])   . Labile blood glucose   . Acute blood loss anemia   . Hypoalbuminemia due to protein-calorie malnutrition ([place])   . Diabetes mellitus type 2 in nonobese (HCC)   . Postoperative pain   . Neuropathic pain   . Seizures ([place])   . Right  below-knee amputee ([place]) 09/19/2018  . Below knee amputation ([place]) 09/17/2018  . Status post transmetatarsal amputation of foot, right ([place]) 07/23/2018  . Osteomyelitis ([place]) 07/23/2018  . TIA (transient ischemic attack) 10/30/2017  . Medication monitoring encounter 03/28/2017  . Status post amputation of toe of right foot ([place]) 03/05/2017  . Subacute osteomyelitis, right ankle and foot ([place])   . Peripheral neuropathy 02/16/2017  . Peripheral artery disease ([place]) 02/16/2017  . Type 2 diabetes mellitus with diabetic foot ulcer ([place]) 02/14/2017  . Diabetic foot infection ([place]) 02/14/2017  . Type II diabetes mellitus with renal manifestations ([place])   . HLD (hyperlipidemia)   . Tobacco abuse   . CKD stage 2 due to type 2 diabetes mellitus ([place])   . CAD (coronary artery disease)   . Seizure disorder [facility])    Past Medical History:  Diagnosis Date  . CAD (coronary artery disease)   . CKD (chronic kidney disease), stage III ([place])   . Diabetes mellitus without complication ([place])    Type II  . History of DVT (deep vein thrombosis)   . History of kidney stones    passed stones - no surgery  . HLD (hyperlipidemia)   . Myocardial infarction ([place]) 2009  . Peripheral vascular disease (HCC)    right leg  . Seizure ([place])    last one 2015  . Stroke [facility]) 2012, 10/2017   2012-speech was effected- has come back.10/2017- numbness of right side no taste right  side. 2012-  . Tobacco abuse     Family History  Problem Relation Age of Onset  . Diabetes Mellitus II Mother   . Diabetes Mellitus II Father   . Colon cancer Neg Hx   . Esophageal cancer Neg Hx     Past Surgical History:  Procedure Laterality Date  . AMPUTATION Right 02/23/2017   Procedure: RIGHT FOOT 5TH RAY AMPUTATION;  Surgeon: Newt Minion, MD;  Location: [facility];  Service: Orthopedics;  Laterality: Right;  . AMPUTATION Right 04/23/2018   Procedure: Right Great Toe Amputation;  Surgeon: Newt Minion, MD;  Location: [place];   Service: Orthopedics;  Laterality: Right;  . AMPUTATION Right 07/23/2018   Procedure: RIGHT TRANSMETATARSAL AMPUTATION, APPLY WOUND VAC;  Surgeon: Newt Minion, MD;  Location: [place];  Service: Orthopedics;  Laterality: Right;  . AMPUTATION Right 09/17/2018   Procedure: RIGHT AMPUTATION BELOW KNEE;  Surgeon: Newt Minion, MD;  Location: [place];  Service: Orthopedics;  Laterality: Right;  . APPLICATION OF WOUND VAC Right 09/17/2018   Procedure: Application Of Wound Vac;  Surgeon: Newt Minion, MD;  Location[facility];  Service: Orthopedics;  Laterality: Right;  . BELOW KNEE LEG AMPUTATION Right 09/17/2018  . CORONARY ANGIOPLASTY  03/05/2008   stent  . FOOT SURGERY    . PERIPHERAL ARTERIAL STENT GRAFT Right 2014   leg 2 stents- clotted per pat  . STENT PLACEMENT VASCULAR ([facility] HX)     Several in right leg  . TONSILLECTOMY    . TRANSMETATARSAL AMPUTATION Right 07/23/2018   Social History   Occupational History  . Not on file  Tobacco Use  . Smoking status: Light Tobacco Smoker    Packs/day: 0.25    Years: 15.00    Pack years: 3.75    Types: Cigarettes  . Smokeless tobacco: Never Used  . Tobacco comment: 2 or 3 a day   Substance and Sexual Activity  . Alcohol use: Not Currently  . Drug use: No  . Sexual activity: Not on file

## 2018-12-17 ENCOUNTER — Encounter: Payer: Self-pay | Admitting: Physician Assistant

## 2019-01-12 ENCOUNTER — Ambulatory Visit (INDEPENDENT_AMBULATORY_CARE_PROVIDER_SITE_OTHER): Payer: Managed Care, Other (non HMO) | Admitting: Orthopedic Surgery

## 2019-01-12 ENCOUNTER — Encounter: Payer: Self-pay | Admitting: Orthopedic Surgery

## 2019-01-12 VITALS — Ht 74.0 in | Wt 195.0 lb

## 2019-01-12 DIAGNOSIS — Z89511 Acquired absence of right leg below knee: Secondary | ICD-10-CM

## 2019-01-14 ENCOUNTER — Encounter: Payer: Self-pay | Admitting: Orthopedic Surgery

## 2019-01-14 NOTE — Progress Notes (Signed)
Office Visit Note   Patient: Curtis Bradford           Date of Birth: 15-Dec-1960           MRN: [id number] Visit Date: 01/12/2019              Requested by: No referring provider defined for this encounter. PCP: Patient, No Pcp Per  Chief Complaint  Patient presents with  . Right Leg - Routine Post Op    09/17/18 right BKA       HPI: [name] a 58 year old gentleman who presents in follow-up for right transtibial amputation.  [name] currently using a prosthesis he states he is developed a rash from sweating underneath the silicone liner.  [name]  [name] 4 months out from amputation.  Assessment & Plan: Visit Diagnoses:  1. 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  Discussed that he should wear this to decrease the maceration from the sweating.  Follow-Up Instructions: Return if symptoms worsen or fail to improve.   Ortho Exam  [name] alert, oriented, no adenopathy, well-dressed, normal affect, normal respiratory effort. Examination patient has a well consolidated residual limb no open wounds no ulcers no signs of infection he does have a mild rash from the dermatitis from sweating within the silicone liner.  Imaging: No results found. No images are attached to the encounter.  Labs: Lab Results  Component Value Date   HGBA1C 7.5 (H) 09/17/2018   HGBA1C 8.3 (H) 07/23/2018   HGBA1C 7.4 (H) 04/23/2018   ESRSEDRATE 31 (H) 02/14/2017   CRP 12.3 (H) 02/14/2017   REPTSTATUS 02/20/2017 FINAL 02/14/2017   REPTSTATUS 02/20/2017 FINAL 02/14/2017   CULT  02/14/2017    NO GROWTH 5 DAYS Performed at [facility], [address] [place], [place] [address]    CULT  02/14/2017    NO GROWTH 5 DAYS Performed at [facility], [address]  [place], [place] [address]      Lab Results  Component Value Date   ALBUMIN 3.9 10/22/2018   ALBUMIN 3.0 (L) 09/22/2018   ALBUMIN 4.1 01/31/2018   PREALBUMIN 13.8 (L) 02/15/2017    No results found for: MG No results found for: VD25OH  Lab Results  Component Value Date   PREALBUMIN 13.8 (L) 02/15/2017   CBC EXTENDED Latest Ref Rng & Units 10/22/2018 09/26/2018 09/22/2018  WBC 4.0 - 10.5 K/uL 21.7(H) 10.1 9.9  RBC 4.22 - 5.81 MIL/uL 4.86 3.52(L) 3.46(L)  HGB 13.0 - 17.0 g/dL 08.614.2 10.2(L) 10.0(L)  HCT 39.0 - 52.0 % 44.1 32.0(L) 31.5(L)  PLT 150 - 400 K/uL 361 460(H) 375  NEUTROABS 1.7 - 7.7 K/uL 16.9(H) 6.5 6.2  LYMPHSABS 0.7 - 4.0 K/uL 2.6 1.9 1.9     Body mass index is 25.04 kg/m.  Orders:  No orders of the defined types were placed in this encounter.  No orders of the defined types were placed in this encounter.    Procedures: No procedures performed  Clinical Data: No additional findings.  ROS:  All other systems negative, except as noted in the HPI. Review of Systems  Objective: Vital Signs: Ht 6\' 2"  (1.88 m)   Wt 195 lb (88.5 kg)   BMI 25.04 kg/m   Specialty  Comments:  No specialty comments available.  PMFS History: Patient Active Problem List   Diagnosis Date Noted  . S/P BKA (below knee amputation) unilateral, right ([place]) 10/23/2018  . Intermittent pain   . Phantom limb pain ([place])   . CKD (chronic kidney disease), stage III ([place])   . Labile blood glucose   . Acute blood loss anemia   . Hypoalbuminemia due to protein-calorie malnutrition ([place])   . Diabetes mellitus type 2 in nonobese (HCC)   . Postoperative pain   . Neuropathic pain   . Seizures ([place])   . Right below-knee amputee ([place]) 09/19/2018  . Below knee amputation ([place]) 09/17/2018  . Status post transmetatarsal amputation of foot, right ([place]) 07/23/2018  . Osteomyelitis ([place]) 07/23/2018  . TIA (transient ischemic attack) 10/30/2017  . Medication monitoring encounter 03/28/2017  .  Status post amputation of toe of right foot ([place]) 03/05/2017  . Subacute osteomyelitis, right ankle and foot ([place])   . Peripheral neuropathy 02/16/2017  . Peripheral artery disease ([place]) 02/16/2017  . Type 2 diabetes mellitus with diabetic foot ulcer ([place]) 02/14/2017  . Diabetic foot infection ([place]) 02/14/2017  . Type II diabetes mellitus with renal manifestations ([place])   . HLD (hyperlipidemia)   . Tobacco abuse   . CKD stage 2 due to type 2 diabetes mellitus ([place])   . CAD (coronary artery disease)   . Seizure disorder [facility])    Past Medical History:  Diagnosis Date  . CAD (coronary artery disease)   . CKD (chronic kidney disease), stage III ([place])   . Diabetes mellitus without complication ([place])    Type II  . History of DVT (deep vein thrombosis)   . History of kidney stones    passed stones - no surgery  . HLD (hyperlipidemia)   . Myocardial infarction ([place]) 2009  . Peripheral vascular disease (HCC)    right leg  . Seizure ([place])    last one 2015  . Stroke [facility]) 2012, 10/2017   2012-speech was effected- has come back.10/2017- numbness of right side no taste right  side. 2012-  . Tobacco abuse     Family History  Problem Relation Age of Onset  . Diabetes Mellitus II Mother   . Diabetes Mellitus II Father   . Colon cancer Neg Hx   . Esophageal cancer Neg Hx     Past Surgical History:  Procedure Laterality Date  . AMPUTATION Right 02/23/2017   Procedure: RIGHT FOOT 5TH RAY AMPUTATION;  Surgeon: Newt Minion, MD;  Location: [place];  Service: Orthopedics;  Laterality: Right;  . AMPUTATION Right 04/23/2018   Procedure: Right Great Toe Amputation;  Surgeon: Newt Minion, MD;  Location: [place];  Service: Orthopedics;  Laterality: Right;  . AMPUTATION Right 07/23/2018   Procedure: RIGHT TRANSMETATARSAL AMPUTATION, APPLY WOUND VAC;  Surgeon: Newt Minion, MD;  Location: [place];  Service: Orthopedics;  Laterality: Right;  . AMPUTATION Right 09/17/2018   Procedure: RIGHT AMPUTATION  BELOW KNEE;  Surgeon: Newt Minion, MD;  Location: [place];  Service: Orthopedics;  Laterality: Right;  . APPLICATION OF WOUND VAC Right 09/17/2018   Procedure: Application Of Wound Vac;  Surgeon: Newt Minion, MD;  Location: [place];  Service: Orthopedics;  Laterality: Right;  . BELOW KNEE LEG AMPUTATION Right 09/17/2018  . CORONARY ANGIOPLASTY  03/05/2008   stent  . FOOT SURGERY    . PERIPHERAL ARTERIAL STENT GRAFT Right 2014   leg 2 stents- clotted per pat  .  STENT PLACEMENT VASCULAR ([facility] HX)     Several in right leg  . TONSILLECTOMY    . TRANSMETATARSAL AMPUTATION Right 07/23/2018   Social History   Occupational History  . Not on file  Tobacco Use  . Smoking status: Light Tobacco Smoker    Packs/day: 0.25    Years: 15.00    Pack years: 3.75    Types: Cigarettes  . Smokeless tobacco: Never Used  . Tobacco comment: 2 or 3 a day   Substance and Sexual Activity  . Alcohol use: Not Currently  . Drug use: No  . Sexual activity: Not on file

## 2019-01-16 ENCOUNTER — Telehealth: Payer: Self-pay | Admitting: Orthopedic Surgery

## 2019-01-16 NOTE — Telephone Encounter (Signed)
Patient called asked if the insurance form was completed so that he can pick it up. The form was from [employer]. The number to contact patient is [phone]

## 2019-01-16 NOTE — Telephone Encounter (Signed)
I called and sw pt ti advise that form is at the desk for pick up.

## 2019-02-02 ENCOUNTER — Telehealth: Payer: Self-pay | Admitting: Orthopedic Surgery

## 2019-02-02 ENCOUNTER — Other Ambulatory Visit: Payer: Self-pay

## 2019-02-02 NOTE — Telephone Encounter (Signed)
Can you please call pt and let him know that return to work letter is ready. Can you print at put up at the front desk please? Thanks!

## 2019-02-02 NOTE — Telephone Encounter (Signed)
Patient was called and lvm to pick up note at front.

## 2019-02-02 NOTE — Telephone Encounter (Signed)
Patient called stating that he is back at work, but did not receive a note at his last visit stating that he is okay to return to work full duty.  CB#[phone].  Thank you.

## 2019-02-08 ENCOUNTER — Other Ambulatory Visit: Payer: Self-pay | Admitting: Endocrinology

## 2019-03-09 ENCOUNTER — Emergency Department (HOSPITAL_COMMUNITY): Payer: Managed Care, Other (non HMO)

## 2019-03-09 ENCOUNTER — Encounter (HOSPITAL_BASED_OUTPATIENT_CLINIC_OR_DEPARTMENT_OTHER): Payer: Self-pay | Admitting: Emergency Medicine

## 2019-03-09 ENCOUNTER — Other Ambulatory Visit: Payer: Self-pay

## 2019-03-09 ENCOUNTER — Emergency Department (HOSPITAL_BASED_OUTPATIENT_CLINIC_OR_DEPARTMENT_OTHER)
Admission: EM | Admit: 2019-03-09 | Discharge: 2019-03-09 | Disposition: A | Discharge status: Home / Self Care | Payer: Managed Care, Other (non HMO) | Attending: Emergency Medicine | Admitting: Emergency Medicine

## 2019-03-09 DIAGNOSIS — N451 Epididymitis: Secondary | ICD-10-CM | POA: Insufficient documentation

## 2019-03-09 DIAGNOSIS — I2510 Atherosclerotic heart disease of native coronary artery without angina pectoris: Secondary | ICD-10-CM | POA: Insufficient documentation

## 2019-03-09 DIAGNOSIS — N183 Chronic kidney disease, stage 3 (moderate): Secondary | ICD-10-CM | POA: Insufficient documentation

## 2019-03-09 DIAGNOSIS — R310 Gross hematuria: Secondary | ICD-10-CM | POA: Diagnosis not present

## 2019-03-09 DIAGNOSIS — N492 Inflammatory disorders of scrotum: Secondary | ICD-10-CM | POA: Diagnosis present

## 2019-03-09 DIAGNOSIS — I252 Old myocardial infarction: Secondary | ICD-10-CM | POA: Insufficient documentation

## 2019-03-09 DIAGNOSIS — E1140 Type 2 diabetes mellitus with diabetic neuropathy, unspecified: Secondary | ICD-10-CM | POA: Insufficient documentation

## 2019-03-09 DIAGNOSIS — E1122 Type 2 diabetes mellitus with diabetic chronic kidney disease: Secondary | ICD-10-CM | POA: Diagnosis not present

## 2019-03-09 DIAGNOSIS — N5089 Other specified disorders of the male genital organs: Secondary | ICD-10-CM

## 2019-03-09 LAB — URINALYSIS, ROUTINE W REFLEX MICROSCOPIC
Bilirubin Urine: NEGATIVE
Glucose, UA: NEGATIVE mg/dL
Ketones, ur: NEGATIVE mg/dL
Nitrite: POSITIVE — AB
Protein, ur: 100 mg/dL — AB
Specific Gravity, Urine: >1.03 — ABNORMAL HIGH (ref 1.005–1.030)
pH: 5.5 (ref 5.0–8.0)

## 2019-03-09 LAB — URINALYSIS, MICROSCOPIC (REFLEX): WBC, UA: >50 WBC/hpf (ref 0–5)

## 2019-03-09 MED ORDER — CIPROFLOXACIN HCL 500 MG PO TABS: 500.0000 mg | ORAL_TABLET | Freq: Two times a day (BID) | ORAL | 0 refills | Status: DC

## 2019-03-09 MED ORDER — HYDROCODONE-ACETAMINOPHEN 5-325 MG PO TABS: 1.0000 | ORAL_TABLET | Freq: Four times a day (QID) | ORAL | 0 refills | Status: DC | PRN

## 2019-03-09 MED ORDER — CIPROFLOXACIN HCL 500 MG PO TABS
500.0000 mg | ORAL_TABLET | Freq: Once | ORAL | Status: AC
  Administered 2019-03-09: 500 mg via ORAL
  Filled 2019-03-09: qty 1

## 2019-03-09 NOTE — ED Provider Notes (Signed)
[facility] EMERGENCY DEPARTMENT Provider Note   CSN: [id number] Arrival date & time: 03/09/19  0131     History   Chief Complaint Chief Complaint  Patient presents with   Hematuria   Groin Swelling    L testicle     HPI Curtis Bradford is a 58 y.o. male.     HPI  This a 58 year old male with a history of coronary artery disease, chronic kidney disease, diabetes, peripheral vascular disease status post BKA on the right who presents with testicle pain, hematuria.  Patient reports onset of symptoms over the last 1 to 2 days.  He has noted hematuria and some dysuria.  He also reports some urgency and frequency.  Yesterday he noted increasing swelling of the left testicle.  He states that it is "twice as big as the right."  He reports pain that is worse with standing.  When sitting down his pain is 3 out of 10 when standing up it is much worse.  He states that he spends some part of his day in a wheelchair and thought that maybe he injured the testicle although he did not recall any specific event.  Denies any penile discharge or concerns for STDs.  Past Medical History:  Diagnosis Date   CAD (coronary artery disease)    CKD (chronic kidney disease), stage III (HCC)    Diabetes mellitus without complication (HCC)    Type II   History of DVT (deep vein thrombosis)    History of kidney stones    passed stones - no surgery   HLD (hyperlipidemia)    Myocardial infarction [facility](HCC) 2009   Peripheral vascular disease (HCC)    right leg   Seizure (HCC)    last one 2015   Stroke (HCC) 2012, 10/2017   2012-speech was effected- has come back.10/2017- numbness of right side no taste right  side. 2012-   Tobacco abuse     Patient Active Problem List   Diagnosis Date Noted   S/P BKA (below knee amputation) unilateral, right (HCC) 10/23/2018   Intermittent pain    Phantom limb pain (HCC)    CKD (chronic kidney disease), stage III (HCC)    Labile blood glucose     Acute blood loss anemia    Hypoalbuminemia due to protein-calorie malnutrition (HCC)    Diabetes mellitus type 2 in nonobese [facility](HCC)    Postoperative pain    Neuropathic pain    Seizures (HCC)    Right below-knee amputee (HCC) 09/19/2018   Below knee amputation (HCC) 09/17/2018   Status post transmetatarsal amputation of foot, right (HCC) 07/23/2018   Osteomyelitis (HCC) 07/23/2018   TIA (transient ischemic attack) 10/30/2017   Medication monitoring encounter 03/28/2017   Status post amputation of toe of right foot (HCC) 03/05/2017   Subacute osteomyelitis, right ankle and foot (HCC)    Peripheral neuropathy 02/16/2017   Peripheral artery disease (HCC) 02/16/2017   Type 2 diabetes mellitus with diabetic foot ulcer (HCC) 02/14/2017   Diabetic foot infection (HCC) 02/14/2017   Type II diabetes mellitus with renal manifestations (HCC)    HLD (hyperlipidemia)    Tobacco abuse    CKD stage 2 due to type 2 diabetes mellitus (HCC)    CAD (coronary artery disease)    Seizure disorder [facility](HCC)     Past Surgical History:  Procedure Laterality Date   AMPUTATION Right 02/23/2017   Procedure: RIGHT FOOT 5TH RAY AMPUTATION;  Surgeon: Nadara Mustarduda, Marcus V, MD;  Location: [facility] OR;  Service: Orthopedics;  Laterality: Right;   AMPUTATION Right 04/23/2018   Procedure: Right Great Toe Amputation;  Surgeon: Nadara Mustarduda, Marcus V, MD;  [facility];  Service: Orthopedics;  Laterality: Right;   AMPUTATION Right 07/23/2018   Procedure: RIGHT TRANSMETATARSAL AMPUTATION, APPLY WOUND VAC;  Surgeon: Nadara Mustarduda, Marcus V, MD;  Location: [facility] OR;  Service: Orthopedics;  Laterality: Right;   AMPUTATION Right 09/17/2018   Procedure: RIGHT AMPUTATION BELOW KNEE;  Surgeon: Nadara Mustarduda, Marcus V, MD;  [facility];  Service: Orthopedics;  Laterality: Right;   APPLICATION OF WOUND VAC Right 09/17/2018   Procedure: Application Of Wound Vac;  Surgeon: Nadara Mustarduda, Marcus V, MD;  [facility];  Service: Orthopedics;   Laterality: Right;   BELOW KNEE LEG AMPUTATION Right 09/17/2018   CORONARY ANGIOPLASTY  03/05/2008   stent   FOOT SURGERY     PERIPHERAL ARTERIAL STENT GRAFT Right 2014   leg 2 stents- clotted per pat   STENT PLACEMENT VASCULAR ([facility] HX)     Several in right leg   TONSILLECTOMY     TRANSMETATARSAL AMPUTATION Right 07/23/2018        Home Medications    Prior to Admission medications   Medication Sig Start Date End Date Taking? Authorizing Provider  aspirin EC 81 MG tablet Take 81 mg by mouth daily.    Yes [provider]  atorvastatin (LIPITOR) 20 MG tablet Take 1 tablet (20 mg total) by mouth daily. 10/02/18  Yes Angiulli, Mcarthur Rossettianiel J, PA-C  clopidogrel (PLAVIX) 75 MG tablet Take 1 tablet (75 mg total) by mouth at bedtime. 10/02/18  Yes Angiulli, Mcarthur Rossettianiel J, PA-C  ezetimibe (ZETIA) 10 MG tablet Take 1 tablet (10 mg total) by mouth daily. 10/02/18  Yes Angiulli, Mcarthur Rossettianiel J, PA-C  Insulin Glargine (LANTUS) 100 UNIT/ML Solostar Pen Inject 27 Units into the skin at bedtime. Patient taking differently: Inject 37 Units into the skin at bedtime.  10/02/18  Yes Angiulli, Mcarthur Rossettianiel J, PA-C  levETIRAcetam (KEPPRA XR) 500 MG 24 hr tablet Take 1 tablet (500 mg total) by mouth daily. 10/02/18  Yes Angiulli, Mcarthur Rossettianiel J, PA-C  linagliptin (TRADJENTA) 5 MG TABS tablet Take 1 tablet (5 mg total) by mouth daily. 10/02/18  Yes Angiulli, Mcarthur Rossettianiel J, PA-C  acetaminophen (TYLENOL) 325 MG tablet Take 1-2 tablets (325-650 mg total) by mouth every 6 (six) hours as needed for mild pain (pain score 1-3 or temp > 100.5). 10/02/18   Angiulli, Mcarthur Rossettianiel J, PA-C  Continuous Blood Gluc Sensor (FREESTYLE LIBRE 14 DAY SENSOR) MISC Place 1 patch onto the skin every 14 (fourteen) days. 06/26/18   Reather LittlerKumar, Ajay, MD  oxyCODONE (OXY IR/ROXICODONE) 5 MG immediate release tablet Take 1 tablet (5 mg total) by mouth every 6 (six) hours as needed for moderate pain (pain score 4-6). 10/27/18   Rayburn, Fanny BienShawn Montgomery, PA-C  vancomycin (VANCOCIN)  125 MG capsule Take 1 capsule (125 mg total) by mouth as directed. Take 1 capsule by mouth four times daily for 2 weeks.  Take 1 capsule by mouth twice daily for 1 week.  Take 1 capsule by mouth once daily for 1 week. Then take 1 capsule by mouth every other day for 6 weeks. Then Stop. 11/24/18   Cirigliano, Verlin DikeVito V, DO    Family History Family History  Problem Relation Age of Onset   Diabetes Mellitus II Mother    Diabetes Mellitus II Father    Colon cancer Neg Hx    Esophageal cancer Neg Hx     Social  History Social History   Tobacco Use   Smoking status: Light Tobacco Smoker    Packs/day: 0.25    Years: 15.00    Pack years: 3.75    Types: Cigarettes   Smokeless tobacco: Never Used   Tobacco comment: 2 or 3 a day   Substance Use Topics   Alcohol use: Not Currently   Drug use: No     Allergies   Patient has no known allergies.   Review of Systems Review of Systems  Constitutional: Negative for fever.  Respiratory: Negative for shortness of breath.   Cardiovascular: Negative for chest pain.  Gastrointestinal: Negative for abdominal pain.  Genitourinary: Positive for dysuria, frequency, scrotal swelling, testicular pain and urgency.  Skin: Positive for color change.  All other systems reviewed and are negative.    Physical Exam Updated Vital Signs BP (!) 162/95 (BP Location: Right Arm)    Pulse 96    Temp 99.2 F (37.3 C) (Oral)    Resp 20    Ht 1.905 m (6\' 3" )    Wt 86.2 kg    SpO2 98%    BMI 23.75 kg/m   Physical Exam Vitals signs and nursing note reviewed.  Constitutional:      Appearance: He is well-developed. He is not ill-appearing.  HENT:     Head: Normocephalic and atraumatic.     Mouth/Throat:     Mouth: Mucous membranes are moist.  Neck:     Musculoskeletal: Neck supple.  Cardiovascular:     Rate and Rhythm: Normal rate and regular rhythm.  Pulmonary:     Effort: Pulmonary effort is normal. No respiratory distress.  Genitourinary:     Comments: Normal circumcised penis, left testicle is enlarged and tender to palpation, epididymis enlarged and hard to touch, scrotum with erythema, no crepitus, minimal cremasteric reflex bilaterally Right testicle without tenderness or enlargement noted Musculoskeletal:     Comments: Right BKA  Skin:    General: Skin is warm and dry.  Neurological:     Mental Status: He is alert and oriented to person, place, and time.  Psychiatric:        Mood and Affect: Mood normal.      ED Treatments / Results  Labs (all labs ordered are listed, but only abnormal results are displayed) Labs Reviewed  URINALYSIS, ROUTINE W REFLEX MICROSCOPIC    EKG None  Radiology No results found.  Procedures Procedures (including critical care time)  Medications Ordered in ED Medications - No data to display   Initial Impression / Assessment and Plan / ED Course  I have reviewed the triage vital signs and the nursing notes.  Pertinent labs & imaging results that were available during my care of the patient were reviewed by me and considered in my medical decision making (see chart for details).        Patient presents with urinary systems and testicular pain and swelling.  He is overall nontoxic-appearing.  Vital signs notable for blood pressure 162/95.  Left testicle is significantly enlarged and tender to palpation.  Considerations include infection, epididymitis, torsion.  Urinalysis is pending.  At this time I do not have access to ultrasound.  Feel he needs emergent ultrasound to rule out torsion.  Discussed this with my colleague Dr. Stark Jock at [facility].  Patient will go by private vehicle to [facility] for further evaluation.  Final Clinical Impressions(s) / ED Diagnoses   Final diagnoses:  Swelling of left testicle  Gross hematuria  ED Discharge Orders    None       Shon Baton, MD 03/09/19 [id number]

## 2019-03-09 NOTE — ED Triage Notes (Signed)
Reports blood in urine x1-2 days, progressively worse L testicle swelling over the last day. Denies dysuria.

## 2019-03-09 NOTE — ED Notes (Signed)
Pt to transfer POV to [facility] ED for ultrasound to r/o testicular torsion. Dr. Dina Rich sending.

## 2019-03-09 NOTE — ED Notes (Signed)
Witnessed groin exam by Dr. Dina Rich.

## 2019-03-09 NOTE — Discharge Instructions (Addendum)
Begin taking Cipro as prescribed.  Hydrocodone as prescribed as needed for pain.  Follow-up with primary doctor if symptoms not improving in the next week, and return to the ER if you develop worsening swelling, high fever, or other new and concerning symptoms.

## 2019-03-09 NOTE — ED Notes (Signed)
Ultrasound at bedside

## 2019-03-09 NOTE — ED Provider Notes (Signed)
Patient sent from [facility] for ultrasound of his left testicle.  Patient noticed dark urine along with burning with urination 2 days ago.  He is now having swelling to the left testicle.  He was initially seen at [facility], then sent here.  Patient's ultrasound shows a left hydrocele with findings consistent with epididymitis.  This will be treated with Cipro, pain medication, and follow-up with primary doctor if not improving.   Veryl Speak, MD 03/09/19 [phone]

## 2019-03-11 ENCOUNTER — Other Ambulatory Visit: Payer: Self-pay

## 2019-03-11 ENCOUNTER — Emergency Department (HOSPITAL_COMMUNITY)
Admission: EM | Admit: 2019-03-11 | Discharge: 2019-03-11 | Disposition: A | Discharge status: Home / Self Care | Payer: Managed Care, Other (non HMO) | Attending: Emergency Medicine | Admitting: Emergency Medicine

## 2019-03-11 ENCOUNTER — Encounter (HOSPITAL_COMMUNITY): Payer: Self-pay

## 2019-03-11 DIAGNOSIS — Z955 Presence of coronary angioplasty implant and graft: Secondary | ICD-10-CM | POA: Diagnosis not present

## 2019-03-11 DIAGNOSIS — N451 Epididymitis: Secondary | ICD-10-CM | POA: Diagnosis not present

## 2019-03-11 DIAGNOSIS — E1140 Type 2 diabetes mellitus with diabetic neuropathy, unspecified: Secondary | ICD-10-CM | POA: Insufficient documentation

## 2019-03-11 DIAGNOSIS — N183 Chronic kidney disease, stage 3 (moderate): Secondary | ICD-10-CM | POA: Insufficient documentation

## 2019-03-11 DIAGNOSIS — E1122 Type 2 diabetes mellitus with diabetic chronic kidney disease: Secondary | ICD-10-CM | POA: Insufficient documentation

## 2019-03-11 DIAGNOSIS — N50812 Left testicular pain: Secondary | ICD-10-CM | POA: Diagnosis present

## 2019-03-11 DIAGNOSIS — I252 Old myocardial infarction: Secondary | ICD-10-CM | POA: Insufficient documentation

## 2019-03-11 DIAGNOSIS — Z87891 Personal history of nicotine dependence: Secondary | ICD-10-CM | POA: Diagnosis not present

## 2019-03-11 DIAGNOSIS — Z7982 Long term (current) use of aspirin: Secondary | ICD-10-CM | POA: Diagnosis not present

## 2019-03-11 DIAGNOSIS — I129 Hypertensive chronic kidney disease with stage 1 through stage 4 chronic kidney disease, or unspecified chronic kidney disease: Secondary | ICD-10-CM | POA: Insufficient documentation

## 2019-03-11 DIAGNOSIS — Z79899 Other long term (current) drug therapy: Secondary | ICD-10-CM | POA: Insufficient documentation

## 2019-03-11 DIAGNOSIS — I2510 Atherosclerotic heart disease of native coronary artery without angina pectoris: Secondary | ICD-10-CM | POA: Diagnosis not present

## 2019-03-11 DIAGNOSIS — Z794 Long term (current) use of insulin: Secondary | ICD-10-CM | POA: Insufficient documentation

## 2019-03-11 MED ORDER — DOXYCYCLINE HYCLATE 100 MG PO CAPS: 100.0000 mg | ORAL_CAPSULE | Freq: Two times a day (BID) | ORAL | 0 refills | Status: AC

## 2019-03-11 MED ORDER — DOXYCYCLINE HYCLATE 100 MG PO TABS
100.0000 mg | ORAL_TABLET | Freq: Once | ORAL | Status: AC
  Administered 2019-03-11: 100 mg via ORAL
  Filled 2019-03-11: qty 1

## 2019-03-11 MED ORDER — IBUPROFEN 200 MG PO TABS
600.0000 mg | ORAL_TABLET | Freq: Once | ORAL | Status: AC
  Administered 2019-03-11: 600 mg via ORAL
  Filled 2019-03-11: qty 3

## 2019-03-11 NOTE — Discharge Instructions (Addendum)
I discussed your  case with Dr. Matilde Sprang at [facility] urology.  Please call tomorrow to make an appointment for soon as possible.    Take doxycycline as instructed in addition to your cipro.  Take antibiotics to completion.  You can take 600 mg of ibuprofen every 6 hours as needed for pain.  This is an anti-inflammatory and pain medication.  You can take your hydrocodone which can also contains Tylenol as prescribed but do not drive, drink alcohol, or operate heavy machinery while taking this medication as it can cause drowsiness.  It can also cause constipation so take with a stool softener.  Apply good scrotal support by wearing more fitted undergarments.  You can also apply ice 20 minutes at a time 3-4 times daily.  Return to the emergency department if any concerning signs or symptoms develop such as high fevers, persistent vomiting, or abdominal pain.

## 2019-03-11 NOTE — ED Triage Notes (Signed)
Pt c/o increasing pain and L testicle swelling x 5 days.  Pt was seen 2 days ago and diagnosed w/ epididymitis.  Reports taking antibiotic as prescribed, but swelling has increased.  Sts "my left testicle is the size of a peach."

## 2019-03-11 NOTE — ED Provider Notes (Signed)
Patient seen in conjunction with myself and PA Fondaw.  Refer to his note for full history and physical examination.  Briefly, patient is a 58 year old male with a history of type 2 diabetes mellitus, hyperlipidemia, CKD presenting with ongoing and worsening left testicular pain.  He was seen in the ED 2 days ago and diagnosed with epididymitis and started on Cipro outpatient which he reports he has been compliant with.  He returns today with worsening swelling and pain.  No abdominal tenderness, nausea, vomiting, or fevers.  He appears uncomfortable but overall well.  He is afebrile and vital signs are stable.  Urology was consulted and recommends addition of doxycycline, obtain a urine culture in the ED today, and outpatient follow-up.  The patient will call tomorrow to set up a follow-up appointment within the next 2 days.  Discussed pain management with anti-inflammatories, scrotal support, and medication compliance with his antibiotics.  Discussed strict ED return precautions. Patient verbalized understanding of and agreement with plan and is safe for discharge home at this time.    Debroah Baller 03/11/19 Evorn Gong, MD 03/12/19 2033

## 2019-03-11 NOTE — ED Provider Notes (Signed)
[facility]-EMERGENCY DEPT Provider Note   CSN: [id number] Arrival date & time: 03/11/19  1626     History   Chief Complaint Chief Complaint  Patient presents with  . Groin Swelling  . Testicle Pain    HPI Curtis Bradford is a 58 y.o. male. Patient with history of type II DM, hyperlipidemia, CKD stroke presents for continued and worsening left testicular pain began 5 days ago.  Patient states pain is 10 out of 10 and sharp worse with standing or moving better with hydrocodone which decreases pain to 5 out of 10.  Patient denies any blood in his urine since he was last seen but states he is peeing more frequently small amounts.  Patient denies any nausea, vomiting, abdominal pain, fevers, chills.  Patient was seen in 9/14 and diagnosed with hydrocele and possible epididymitis by testicular ultrasound treated for epididymitis with Cipro given pain medication.  She has not followed up with family medicine as he does not have established primary care. Patient has been compliant with Cipro.     HPI  Past Medical History:  Diagnosis Date  . CAD (coronary artery disease)   . CKD (chronic kidney disease), stage III (HCC)   . 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 History of DVT (deep vein thrombosis)   . History of kidney stones    passed stones - no surgery  . HLD (hyperlipidemia)   . Myocardial infarction (HCC) 2009  . Peripheral vascular disease (HCC)    right leg  . Seizure (HCC)    last one 2015  . Stroke [facility](HCC) 2012, 10/2017   2012-speech was effected- has come back.10/2017- numbness of right side no taste right  side. 2012-  . Tobacco abuse     Patient Active Problem List   Diagnosis Date Noted  . S/P BKA (below knee amputation) unilateral, right (HCC) 10/23/2018  . Intermittent pain   . Phantom limb pain (HCC)   . CKD (chronic kidney disease), stage III (HCC)   . Labile blood glucose   . Acute blood loss anemia   .  Hypoalbuminemia due to protein-calorie malnutrition (HCC)   . Diabetes mellitus type 2 in nonobese (HCC)   . Postoperative pain   . Neuropathic pain   . Seizures (HCC)   . Right below-knee amputee (HCC) 09/19/2018  . Below knee amputation (HCC) 09/17/2018  . Status post transmetatarsal amputation of foot, right (HCC) 07/23/2018  . Osteomyelitis (HCC) 07/23/2018  . TIA (transient ischemic attack) 10/30/2017  . Medication monitoring encounter 03/28/2017  . Status post amputation of toe of right foot (HCC) 03/05/2017  . Subacute osteomyelitis, right ankle and foot (HCC)   . Peripheral neuropathy 02/16/2017  . Peripheral artery disease (HCC) 02/16/2017  . 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 Diabetic foot infection (HCC) 02/14/2017  . Type II diabetes mellitus with renal manifestations (HCC)   . HLD (hyperlipidemia)   . Tobacco abuse   . CKD stage 2 due to type 2 diabetes mellitus (HCC)   . CAD (coronary artery disease)   . Seizure disorder [facility](HCC)     Past Surgical History:  Procedure Laterality Date  . AMPUTATION Right 02/23/2017   Procedure: RIGHT FOOT 5TH RAY AMPUTATION;  Surgeon: Nadara Mustarduda, Marcus V, MD;  [facility];  Service: Orthopedics;  Laterality: Right;  . AMPUTATION Right 04/23/2018   Procedure: Right Great Toe Amputation;  Surgeon: Nadara Mustarduda, Marcus V, MD;  Location:  [place] OR;  Service: Orthopedics;  Laterality: Right;  . AMPUTATION Right 07/23/2018   Procedure: RIGHT TRANSMETATARSAL AMPUTATION, APPLY WOUND VAC;  Surgeon: Newt Minion, MD;  Location: [place];  Service: Orthopedics;  Laterality: Right;  . AMPUTATION Right 09/17/2018   Procedure: RIGHT AMPUTATION BELOW KNEE;  Surgeon: Newt Minion, MD;  Location: [place];  Service: Orthopedics;  Laterality: Right;  . APPLICATION OF WOUND VAC Right 09/17/2018   Procedure: Application Of Wound Vac;  Surgeon: Newt Minion, MD;  Location: [place];  Service: Orthopedics;  Laterality: Right;  . BELOW KNEE LEG  AMPUTATION Right 09/17/2018  . CORONARY ANGIOPLASTY  03/05/2008   stent  . FOOT SURGERY    . PERIPHERAL ARTERIAL STENT GRAFT Right 2014   leg 2 stents- clotted per pat  . STENT PLACEMENT VASCULAR ([place] HX)     Several in right leg  . TONSILLECTOMY    . TRANSMETATARSAL AMPUTATION Right 07/23/2018        Home Medications    Prior to Admission medications   Medication Sig Start Date End Date Taking? Authorizing Provider  acetaminophen (TYLENOL) 325 MG tablet Take 1-2 tablets (325-650 mg total) by mouth every 6 (six) hours as needed for mild pain (pain score 1-3 or temp > 100.5). 10/02/18   Angiulli, Lavon Paganini, PA-C  aspirin EC 81 MG tablet Take 81 mg by mouth daily.     [provider]  atorvastatin (LIPITOR) 20 MG tablet Take 1 tablet (20 mg total) by mouth daily. 10/02/18   Angiulli, Lavon Paganini, PA-C  ciprofloxacin (CIPRO) 500 MG tablet Take 1 tablet (500 mg total) by mouth 2 (two) times daily. One po bid x 7 days 03/09/19   Veryl Speak, MD  clopidogrel (PLAVIX) 75 MG tablet Take 1 tablet (75 mg total) by mouth at bedtime. 10/02/18   Angiulli, Lavon Paganini, PA-C  Continuous Blood Gluc Sensor (FREESTYLE LIBRE 14 DAY SENSOR) MISC Place 1 patch onto the skin every 14 (fourteen) days. 06/26/18   Elayne Snare, MD  ezetimibe (ZETIA) 10 MG tablet Take 1 tablet (10 mg total) by mouth daily. 10/02/18   Angiulli, Lavon Paganini, PA-C  HYDROcodone-acetaminophen (NORCO) 5-325 MG tablet Take 1-2 tablets by mouth every 6 (six) hours as needed. 03/09/19   Veryl Speak, MD  Insulin Glargine (LANTUS) 100 UNIT/ML Solostar Pen Inject 27 Units into the skin at bedtime. Patient taking differently: Inject 37 Units into the skin at bedtime.  10/02/18   Angiulli, Lavon Paganini, PA-C  levETIRAcetam (KEPPRA XR) 500 MG 24 hr tablet Take 1 tablet (500 mg total) by mouth daily. 10/02/18   Angiulli, Lavon Paganini, PA-C  linagliptin (TRADJENTA) 5 MG TABS tablet Take 1 tablet (5 mg total) by mouth daily. 10/02/18   Angiulli, Lavon Paganini, PA-C   oxyCODONE (OXY IR/ROXICODONE) 5 MG immediate release tablet Take 1 tablet (5 mg total) by mouth every 6 (six) hours as needed for moderate pain (pain score 4-6). 10/27/18   Rayburn, Neta Mends, PA-C  vancomycin (VANCOCIN) 125 MG capsule Take 1 capsule (125 mg total) by mouth as directed. Take 1 capsule by mouth four times daily for 2 weeks.  Take 1 capsule by mouth twice daily for 1 week.  Take 1 capsule by mouth once daily for 1 week. Then take 1 capsule by mouth every other day for 6 weeks. Then Stop. 11/24/18   Cirigliano, Dominic Pea, DO    Family History Family History  Problem Relation Age of Onset  . Diabetes Mellitus  II Mother   . Diabetes Mellitus II Father   . Colon cancer Neg Hx   . Esophageal cancer Neg Hx     Social History Social History   Tobacco Use  . Smoking status: Light Tobacco Smoker    Packs/day: 0.25    Years: 15.00    Pack years: 3.75    Types: Cigarettes  . Smokeless tobacco: Never Used  . Tobacco comment: 2 or 3 a day   Substance Use Topics  . Alcohol use: Not Currently  . Drug use: No     Allergies   Patient has no known allergies.   Review of Systems Review of Systems  Constitutional: Negative for chills and fever.  HENT: Negative for congestion.   Eyes: Negative for pain.  Respiratory: Negative for cough and shortness of breath.   Cardiovascular: Negative for chest pain and leg swelling.  Gastrointestinal: Negative for abdominal pain and nausea.  Genitourinary: Positive for frequency. Negative for dysuria.  Musculoskeletal: Negative for myalgias.  Skin: Negative for rash.  Neurological: Negative for dizziness and headaches.     Physical Exam Updated Vital Signs BP 135/81 (BP Location: Left Arm)   Pulse 91   Temp 98.6 F (37 C) (Oral)   Resp 18   SpO2 97%   Physical Exam Vitals signs and nursing note reviewed.  Constitutional:      General: He is not in acute distress. HENT:     Head: Normocephalic and atraumatic.     Nose:  Nose normal.  Eyes:     General: No scleral icterus. Neck:     Musculoskeletal: Normal range of motion.  Cardiovascular:     Rate and Rhythm: Normal rate and regular rhythm.     Pulses: Normal pulses.     Comments: Murmur present Pulmonary:     Effort: Pulmonary effort is normal. No respiratory distress.     Breath sounds: No wheezing.  Abdominal:     Palpations: Abdomen is soft.     Tenderness: There is no abdominal tenderness. There is no guarding.  Genitourinary:    Penis: Normal.      Comments: Left testicle is swollen and tender to palpation.  Approximately the size of an apple. Musculoskeletal:     Left lower leg: No edema.     Comments: Right-sided BKA  Skin:    General: Skin is warm and dry.     Capillary Refill: Capillary refill takes less than 2 seconds.  Neurological:     Mental Status: He is alert. Mental status is at baseline.  Psychiatric:        Mood and Affect: Mood normal.        Behavior: Behavior normal.      ED Treatments / Results  Labs (all labs ordered are listed, but only abnormal results are displayed) Labs Reviewed - No data to display  EKG None  Radiology No results found.  Procedures Procedures (including critical care time)  Medications Ordered in ED Medications - No data to display   Initial Impression / Assessment and Plan / ED Course  I have reviewed the triage vital signs and the nursing notes.  Pertinent labs & imaging results that were available during my care of the patient were reviewed by me and considered in my medical decision making (see chart for details).        [name] 58 year old male seen 2 days ago and diagnosed with epididymitis treated with Cipro patient has been compliant with antibiotic regimen.  Today states pain and swelling is worse. Patient does not have PCP follow up or have urologist. Discussed case with MacDiarmid who agrees with plan to add doxycycline and is willing to see patient OP.  Patient  has stable vital signs at time of discharge no abdominal pain, nausea, vomiting, fever.  Able to tolerate p.o.  Patient understanding of plan and need for follow-up with urology.  Patient given return precautions.  Final Clinical Impressions(s) / ED Diagnoses   Final diagnoses:  None    ED Discharge Orders    None       Gailen ShelterFondaw, Billie Trager S, [place]PA 03/11/19 Marda [id number]    Knapp, Jon, MD 03/12/19 2033

## 2019-03-13 LAB — URINE CULTURE: Culture: NO GROWTH

## 2019-06-30 ENCOUNTER — Encounter: Payer: Self-pay | Admitting: Family

## 2019-06-30 ENCOUNTER — Ambulatory Visit (INDEPENDENT_AMBULATORY_CARE_PROVIDER_SITE_OTHER): Payer: Managed Care, Other (non HMO) | Admitting: Family

## 2019-06-30 ENCOUNTER — Ambulatory Visit: Payer: Self-pay

## 2019-06-30 ENCOUNTER — Other Ambulatory Visit: Payer: Self-pay

## 2019-06-30 VITALS — Ht 75.0 in | Wt 190.0 lb

## 2019-06-30 DIAGNOSIS — Z89511 Acquired absence of right leg below knee: Secondary | ICD-10-CM

## 2019-06-30 DIAGNOSIS — S88119S Complete traumatic amputation at level between knee and ankle, unspecified lower leg, sequela: Secondary | ICD-10-CM

## 2019-06-30 NOTE — Progress Notes (Signed)
Office Visit Note   Patient: [facility]           Date of Birth: September 08, 1960           MRN: [id number] Visit Date: 06/30/2019              Requested by: No referring provider defined for this encounter. PCP: Patient, No Pcp Per  Chief Complaint  Patient presents with  . Right Leg - Follow-up    09/17/18 right BKA       HPI: The patient is a 59 year old gentleman seen today for evaluation of swelling to right residual limb. He is quite active works for [facility], walks quite a bit. His prosthetist [facility] noticed an odd bulbous shape to distal medial aspect of his residual limb. Patient relates this has been waxing waning for weeks to months. Does not seem to cause him any pain or distress. States does have worse edema at end of day. Resolves some over night States has not been wearing his shrinker because he does not want to loose any more volume-- has lost volume over last several months led to him having to wear more ply and have modifications to his prosthesis.  Assessment & Plan: Visit Diagnoses:  1. Complete below-knee amputation, sequela (HCC)   2. Acquired absence of right lower extremity below knee [facility])     Plan: Spoke with Curtis Bradford by phone.  We will proceed with having patient wear shrinker at night for compression.  No worrisome sign no concern for infection or fracture.  He will continue wearing his prosthesis to his tolerance.  Follow with [facility].  Did provide an order for new socket and supplies  Follow-Up Instructions: Return if symptoms worsen or fail to improve.   Ortho Exam  [name] alert, oriented, no adenopathy, well-dressed, normal affect, normal respiratory effort. On examination of the right residual limb this is well consolidated and well-healed.  He does have a bulbous edematous area to the medial aspect of his distal residual limb.  This is about the size of a clementine.  Is nontender no warmth no erythema no fluctuance  Imaging: No results found. No images  are attached to the encounter.  Labs: Lab Results  Component Value Date   HGBA1C 7.5 (H) 09/17/2018   HGBA1C 8.3 (H) 07/23/2018   HGBA1C 7.4 (H) 04/23/2018   ESRSEDRATE 31 (H) 02/14/2017   CRP 12.3 (H) 02/14/2017   REPTSTATUS 03/13/2019 FINAL 03/11/2019   CULT  03/11/2019    NO GROWTH Performed at [facility], [address] [place], [place] [address]      Lab Results  Component Value Date   ALBUMIN 3.9 10/22/2018   ALBUMIN 3.0 (L) 09/22/2018   ALBUMIN 4.1 01/31/2018   PREALBUMIN 13.8 (L) 02/15/2017    No results found for: MG No results found for: VD25OH  Lab Results  Component Value Date   PREALBUMIN 13.8 (L) 02/15/2017   CBC EXTENDED Latest Ref Rng & Units 10/22/2018 09/26/2018 09/22/2018  WBC 4.0 - 10.5 K/uL 21.7(H) 10.1 9.9  RBC 4.22 - 5.81 MIL/uL 4.86 3.52(L) 3.46(L)  HGB 13.0 - 17.0 g/dL 62.8 10.2(L) 10.0(L)  HCT 39.0 - 52.0 % 44.1 32.0(L) 31.5(L)  PLT 150 - 400 K/uL 361 460(H) 375  NEUTROABS 1.7 - 7.7 K/uL 16.9(H) 6.5 6.2  LYMPHSABS 0.7 - 4.0 K/uL 2.6 1.9 1.9     Body mass index is 23.75 kg/m.  Orders:  Orders Placed This Encounter  Procedures  .  XR Tibia/Fibula Right   No orders of the defined types were placed in this encounter.    Procedures: No procedures performed  Clinical Data: No additional findings.  ROS:  All other systems negative, except as noted in the HPI. Review of Systems  Constitutional: Negative for chills and fever.  Cardiovascular: Positive for leg swelling.  Skin: Negative for color change, rash and wound.    Objective: Vital Signs: Ht 6\' 3"  (1.905 m)   Wt 190 lb (86.2 kg)   BMI 23.75 kg/m   Specialty Comments:  No specialty comments available.  PMFS History: Patient Active Problem List   Diagnosis Date Noted  . S/P BKA (below knee amputation) unilateral, right (HCC) 10/23/2018  . Intermittent pain   . Phantom limb pain (HCC)   . CKD (chronic kidney disease), stage III   . Labile blood glucose   .  Acute blood loss anemia   . Hypoalbuminemia due to protein-calorie malnutrition (HCC)   . Diabetes mellitus type 2 in nonobese (HCC)   . Postoperative pain   . Neuropathic pain   . Seizures (HCC)   . Right below-knee amputee (HCC) 09/19/2018  . Below knee amputation (HCC) 09/17/2018  . Status post transmetatarsal amputation of foot, right (HCC) 07/23/2018  . Osteomyelitis (HCC) 07/23/2018  . TIA (transient ischemic attack) 10/30/2017  . Medication monitoring encounter 03/28/2017  . Status post amputation of toe of right foot (HCC) 03/05/2017  . Subacute osteomyelitis, right ankle and foot (HCC)   . Peripheral neuropathy 02/16/2017  . Peripheral artery disease (HCC) 02/16/2017  . 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 Diabetic foot infection (HCC) 02/14/2017  . Type II diabetes mellitus with renal manifestations (HCC)   . HLD (hyperlipidemia)   . Tobacco abuse   . CKD stage 2 due to type 2 diabetes mellitus (HCC)   . CAD (coronary artery disease)   . Seizure disorder [facility])    Past Medical History:  Diagnosis Date  . CAD (coronary artery disease)   . CKD (chronic kidney disease), stage III   . 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 History of DVT (deep vein thrombosis)   . History of kidney stones    passed stones - no surgery  . HLD (hyperlipidemia)   . Myocardial infarction (HCC) 2009  . Peripheral vascular disease (HCC)    right leg  . Seizure (HCC)    last one 2015  . Stroke [facility]) 2012, 10/2017   2012-speech was effected- has come back.10/2017- numbness of right side no taste right  side. 2012-  . Tobacco abuse     Family History  Problem Relation Age of Onset  . Diabetes Mellitus II Mother   . Diabetes Mellitus II Father   . Colon cancer Neg Hx   . Esophageal cancer Neg Hx     Past Surgical History:  Procedure Laterality Date  . AMPUTATION Right 02/23/2017   Procedure: RIGHT FOOT 5TH RAY AMPUTATION;  Surgeon:  04/25/2017, MD;  Location: [facility];  Service: Orthopedics;  Laterality: Right;  . AMPUTATION Right 04/23/2018   Procedure: Right Great Toe Amputation;  Surgeon: 04/25/2018, MD;  Location: [facility] OR;  Service: Orthopedics;  Laterality: Right;  . AMPUTATION Right 07/23/2018   Procedure: RIGHT TRANSMETATARSAL AMPUTATION, APPLY WOUND VAC;  Surgeon: 07/25/2018, MD;  Location: [facility] OR;  Service: Orthopedics;  Laterality: Right;  . AMPUTATION Right 09/17/2018   Procedure: RIGHT  AMPUTATION BELOW KNEE;  Surgeon: Newt Minion, MD;  Location: [facility];  Service: Orthopedics;  Laterality: Right;  . APPLICATION OF WOUND VAC Right 09/17/2018   Procedure: Application Of Wound Vac;  Surgeon: Newt Minion, MD;  Location: [facility];  Service: Orthopedics;  Laterality: Right;  . BELOW KNEE LEG AMPUTATION Right 09/17/2018  . CORONARY ANGIOPLASTY  03/05/2008   stent  . FOOT SURGERY    . PERIPHERAL ARTERIAL STENT GRAFT Right 2014   leg 2 stents- clotted per pat  . STENT PLACEMENT VASCULAR ([place] [facility]X)     Several in right leg  . TONSILLECTOMY    . TRANSMETATARSAL AMPUTATION Right 07/23/2018   Social History   Occupational History  . Not on file  Tobacco Use  . Smoking status: Light Tobacco Smoker    Packs/day: 0.25    Years: 15.00    Pack years: 3.75    Types: Cigarettes  . Smokeless tobacco: Never Used  . Tobacco comment: 2 or 3 a day   Substance and Sexual Activity  . Alcohol use: Not Currently  . Drug use: No  . Sexual activity: Not on file

## 2019-12-15 ENCOUNTER — Encounter: Payer: Self-pay | Admitting: Adult Health

## 2019-12-15 ENCOUNTER — Ambulatory Visit (INDEPENDENT_AMBULATORY_CARE_PROVIDER_SITE_OTHER): Payer: Managed Care, Other (non HMO) | Admitting: Adult Health

## 2019-12-15 VITALS — BP 127/80 | HR 83 | Ht 74.0 in | Wt 211.0 lb

## 2019-12-15 DIAGNOSIS — I639 Cerebral infarction, unspecified: Secondary | ICD-10-CM | POA: Diagnosis not present

## 2019-12-15 DIAGNOSIS — N182 Chronic kidney disease, stage 2 (mild): Secondary | ICD-10-CM

## 2019-12-15 DIAGNOSIS — E1122 Type 2 diabetes mellitus with diabetic chronic kidney disease: Secondary | ICD-10-CM | POA: Diagnosis not present

## 2019-12-15 DIAGNOSIS — E785 Hyperlipidemia, unspecified: Secondary | ICD-10-CM

## 2019-12-15 DIAGNOSIS — I10 Essential (primary) hypertension: Secondary | ICD-10-CM

## 2019-12-15 DIAGNOSIS — Z794 Long term (current) use of insulin: Secondary | ICD-10-CM

## 2019-12-15 DIAGNOSIS — I6381 Other cerebral infarction due to occlusion or stenosis of small artery: Secondary | ICD-10-CM

## 2019-12-15 MED ORDER — ATORVASTATIN CALCIUM 20 MG PO TABS: 20.0000 mg | ORAL_TABLET | Freq: Every day | ORAL | 0 refills | Status: DC

## 2019-12-15 MED ORDER — CLOPIDOGREL BISULFATE 75 MG PO TABS: 75.0000 mg | ORAL_TABLET | Freq: Every day | ORAL | 0 refills | Status: AC

## 2019-12-15 MED ORDER — EZETIMIBE 10 MG PO TABS: 10.0000 mg | ORAL_TABLET | Freq: Every day | ORAL | 0 refills | Status: DC

## 2019-12-15 NOTE — Patient Instructions (Addendum)
Your Plan:  Please establish care with a PCP - referral placed to schedule visit - 90 day supply refill provided but ongoing refills will need to be through PCP   Follow up as needed       Thank you for coming to see [place] at [facility]. I hope we have been able to provide you high quality care today.  You may receive a patient satisfaction survey over the next few weeks. We would appreciate your feedback and comments so that we may continue to improve ourselves and the health of our patients.

## 2019-12-15 NOTE — Progress Notes (Signed)
I agree with the above plan 

## 2019-12-15 NOTE — Progress Notes (Signed)
[facility] [address] [place]. [place] [address] [phone]       FOLLOW UP NOTE  [name] Date of Birth:  1960-10-30 Medical Record Number:  [id number]   Reason for visit: Stroke follow up   HPI:  Initial visit 12/03/2017: Curtis Bradford is being seen today in the office for left thalamic infarct on 10/30/2017. History obtained from patient and chart review. Reviewed all radiology images and labs personally.  Patient 59 year-old male with history of CAD/MI in 2009, CKD, DM, current smoker, PVD with multiple stents, HLD, one-time seizure due to metformin overdose, left MCA stroke in 2012 who was admitted for right-sided numbness for 4 days.  CT head showed left parietal MCA old infarct, left thalamic subacute infarct.  MRI showed acute left thalamic infarct and old left MCA and right thalamus infarct.  MRA unremarkable.  CTA neck with mild arthrosclerosis but no significant stenosis.  2D echo showed an EF of 55 to 60%.  LDL satisfactory at 38.  A1c elevated at 9.9.  Patient had stroke in 2012 with sudden onset aphasia.  Admitted to hospital in [place].    It was unsure about a stroke work-up over there, however patient stated that he was not told any etiology for the stroke.  It took him 6 months to get speech back.  He also had MI in 2009, and multiple lower extremity stent for PVD. In 2016, patient did have one seizure after accidentally overdosing on metformin and since discontinuation of metformin, he denies seizure activity but has continued on Keppra.  Patient does have a history of right leg DVT 5 years ago and eventually received 2 stents that were subsequently clogged, unclogged, and clogged then additional 3 stents were placed/replaced.  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  Given history of embolic pattern stroke in 2012, it was decided to further work-up  with a TCD bubble study which was negative for PFO, lower extremity venous Dopplers were negative for DVT and hypercoagulable work-up negative.  Recommended increasing Lipitor 20 mg to 80 mg for cholesterol control.  Also recommended close outpatient follow-up with PCP regarding elevated A1c level.  Patient was advised to quit smoking as he is an active smoker.  Patient discharged home in stable condition.    12/03/2017 visit: [name] being seen today for hospital follow-up.  He continues to have complaints of right-sided numbness along with facial numbness but has been improving.  He states when he first was discharged from the hospital he was unable to pull something out of his pocket as he was unable to feel anything but now he is able to see on object and pull it out.  He continues to take both aspirin and Plavix with mild bruising but no bleeding.  Continues to take Lipitor without side effects of myalgias.  Blood pressure satisfactory 121/82.Patient has returned to all previous activities including work and driving.   He does have recent complaint of ringing sensation in his right ear along with coughing spells that has been occurring for approximately 2 weeks with mild postnasal drip.  He is using Mucinex DM which has been helping some.   Patient moved to the [place] area from [place] last October and has not found a PCP at this time.  Patient does have many medications for diabetic control.  Denies new or worsening stroke/TIA symptoms.  04/09/2018 visit: Patient returns today for scheduled follow-up visit.  He has been doing well from a stroke standpoint without residual deficits or recurring of symptoms.  He has continued taking both aspirin and Plavix without side effects of bleeding or bruising.  Continues to take Lipitor without side effects myalgias.  He was seen by endocrinology who did lab work on 01/31/2018 which showed LDL 22, cholesterol 86, triglycerides 200 and HDL 23.9.  Also A1c  obtained which showed 6.5.  He continues to work without complication and continues to do all prior activities.  Blood pressure today 140/70.  Referral was placed for nephrology as well as internal medicine for PCP establishment but has not received a call from either offices.  Patient was provided with list of PCP offices in the [place] area along with phone number to contact nephrology due to continued elevated creatinine level.  Patient denies any recent seizure activity and continues to be compliant with Keppra thousand milligrams nightly.  Patient questioning whether this can start to be tapered off.  No further concerns at this time.  Denies new or worsening stroke/TIA symptoms.  Interval history 06/10/18: [name] being seen today for 60-month follow-up visit.  Patient did undergo right great toe amputation on 04/23/2018. He has had some complications including blistering due to continuing to work and was off from work for 1 weeks time. Last night was his first night back to work.  He does endorse some pain and swelling but is able to tolerate. He has decreased lipitor and started on Zetia. He denies any side effects. He continues on aspirin and plavix without side effects of bleeding or bruising. He has not followed up with nephrology due to a lot going on with his recent toe amputation.  He has had recent lab work which showed improvement of his creatinine at 1.54 which decreased from 1.81.  He does state he has been having a difficult time controlling glucose levels since his amputation which have been fluctuating and he does continue to follow with endocrinology for management. He does continue on keppra 500mg  without any recent seizure activity. He continues to have numbness and tingling on right upper and lower extremity which has been present since his stroke. Denies nerve pain associated. No further concerns at this time. Denies new or worsening stroke/TIA symptoms.   12/10/2018 VIRTUAL VISIT:  Curtis Bradford returns today for 58-month follow-up regarding left thalamic infarct in 10/2017.  He has been stable from a stroke standpoint without new or worsening stroke/TIA symptoms.  Residual deficit of mild right upper and lower extremity numbness.  He continues on aspirin 81 mg and clopidogrel 75 mg daily without side effects of bleeding or bruising.  Continues on atorvastatin without side effects of myalgias.  Prior lipid panel showed LDL 2 but triglycerides increased to 333 and was started on Zetia.  He has not had repeat lab work since this time.  Recent A1c 7.5.  Continues on Keppra 500 mg daily without additional seizure activity. 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  He has since obtained a prosthetic last week and plans on returning to company to make adjustments this afternoon. He was able to use for 8-9 hours yesterday but removed due to pain. He is able to ambulate with a crutch. He has returned to work on light duty without difficulty.  He continues to work with therapy.  No further concerns at this time.  Denies new or worsening stroke/TIA symptoms.   Update 12/15/2019: Curtis Bradford is being seen for yearly follow-up.  He has been stable since prior visit from a stroke standpoint with residual right-sided numbness/tingling which has been stable without worsening.  He does report occasional thumb pain or snapping sensation but has not had this further evaluated.  He has not established care yet with PCP.  Denies new stroke/TIA symptoms.  Continues on aspirin and plavix as well as atorvastatin for secondary stroke prevention.  Blood pressure today 127/80.  Glucose levels have been slightly elevated for patient.  He reports running out of keppra approx 6 months ago and has not had any additional seizures since then.  Continues to follow with orthopedics routinely.  No  concerns at this time.      ROS:   14 system review of systems performed and negative with exception of numbness/tingling difficulty  PMH:  Past Medical History:  Diagnosis Date  . CAD (coronary artery disease)   . CKD (chronic kidney disease), stage III   . 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 History of DVT (deep vein thrombosis)   . History of kidney stones    passed stones - no surgery  . HLD (hyperlipidemia)   . Myocardial infarction (HCC) 2009  . Peripheral vascular disease (HCC)    right leg  . Seizure (HCC)    last one 2015  . Stroke [facility]) 2012, 10/2017   2012-speech was effected- has come back.10/2017- numbness of right side no taste right  side. 2012-  . Tobacco abuse     PSH:  Past Surgical History:  Procedure Laterality Date  . AMPUTATION Right 02/23/2017   Procedure: RIGHT FOOT 5TH RAY AMPUTATION;  Surgeon: [name], MD;  Location: [facility] OR;  Service: Orthopedics;  Laterality: Right;  . AMPUTATION Right 04/23/2018   Procedure: Right Great Toe Amputation;  Surgeon: [name], MD;  Location: [facility] OR;  Service: Orthopedics;  Laterality: Right;  . AMPUTATION Right 07/23/2018   Procedure: RIGHT TRANSMETATARSAL AMPUTATION, APPLY WOUND VAC;  Surgeon: [name], MD;  Location: [facility] OR;  Service: Orthopedics;  Laterality: Right;  . AMPUTATION Right 09/17/2018   Procedure: RIGHT AMPUTATION BELOW KNEE;  Surgeon: [name], MD;  Location: [facility] OR;  Service: Orthopedics;  Laterality: Right;  . APPLICATION OF WOUND VAC Right 09/17/2018   Procedure: Application Of Wound Vac;  Surgeon: [name], MD;  Location: [facility] OR;  Service: Orthopedics;  Laterality: Right;  . BELOW KNEE LEG AMPUTATION Right 09/17/2018  . CORONARY ANGIOPLASTY  03/05/2008   stent  . FOOT SURGERY    . PERIPHERAL ARTERIAL STENT GRAFT Right 2014   leg 2 stents- clotted per pat  . STENT PLACEMENT VASCULAR ([facility] HX)     Several in right leg  . TONSILLECTOMY    .  TRANSMETATARSAL AMPUTATION Right 07/23/2018    Social History:  Social History   Socioeconomic History  . Marital status: Single    Spouse name: Not on file  . Number of children: Not on file  . Years of education: Not on file  . Highest education level: Not on file  Occupational History  . Not on file  Tobacco Use  . Smoking status: Light Tobacco Smoker    Packs/day: 0.25    Years: 15.00    Pack years: 3.75    Types: Cigarettes  . Smokeless tobacco: Never Used  .  Tobacco comment: 2 or 3 a day   Vaping Use  . Vaping Use: Never used  Substance and Sexual Activity  . Alcohol use: Not Currently  . Drug use: No  . Sexual activity: Not on file  Other Topics Concern  . Not on file  Social History Narrative  . Not on file   Social Determinants of Health   Financial Resource Strain:   . Difficulty of Paying Living Expenses:   Food Insecurity:   . Worried About Programme researcher, broadcasting/film/video in the Last Year:   . Barista in the Last Year:   Transportation Needs:   . Freight forwarder (Medical):   Marland Kitchen Lack of Transportation (Non-Medical):   Physical Activity:   . Days of Exercise per Week:   . Minutes of Exercise per Session:   Stress:   . Feeling of Stress :   Social Connections:   . Frequency of Communication with Friends and Family:   . Frequency of Social Gatherings with Friends and Family:   . Attends Religious Services:   . Active Member of Clubs or Organizations:   . Attends Banker Meetings:   Marland Kitchen Marital Status:   Intimate Partner Violence:   . Fear of Current or Ex-Partner:   . Emotionally Abused:   Marland Kitchen Physically Abused:   . Sexually Abused:     Family History:  Family History  Problem Relation Age of Onset  . Diabetes Mellitus II Mother   . Diabetes Mellitus II Father   . Colon cancer Neg Hx   . Esophageal cancer Neg Hx     Medications:   Current Outpatient Medications on File Prior to Visit  Medication Sig Dispense Refill  .  acetaminophen (TYLENOL) 325 MG tablet Take 1-2 tablets (325-650 mg total) by mouth every 6 (six) hours as needed for mild pain (pain score 1-3 or temp > 100.5).    Marland Kitchen aspirin EC 81 MG tablet Take 81 mg by mouth daily.     . Continuous Blood Gluc Sensor (FREESTYLE LIBRE 14 DAY SENSOR) MISC Place 1 patch onto the skin every 14 (fourteen) days. 6 each 2  . Insulin Glargine (LANTUS) 100 UNIT/ML Solostar Pen Inject 27 Units into the skin at bedtime. (Patient taking differently: Inject 37 Units into the skin at bedtime. ) 15 mL 11  . linagliptin (TRADJENTA) 5 MG TABS tablet Take 1 tablet (5 mg total) by mouth daily. 30 tablet 3  . vancomycin (VANCOCIN) 125 MG capsule Take 1 capsule (125 mg total) by mouth as directed. Take 1 capsule by mouth four times daily for 2 weeks.  Take 1 capsule by mouth twice daily for 1 week.  Take 1 capsule by mouth once daily for 1 week. Then take 1 capsule by mouth every other day for 6 weeks. Then Stop. 98 capsule 0   No current facility-administered medications on file prior to visit.    Allergies:  No Known Allergies   Physical Exam Today's Vitals   12/15/19 1442  BP: 127/80  Pulse: 83  Weight: 211 lb (95.7 kg)  Height: 6\' 2"  (1.88 m)   Body mass index is 27.09 kg/m.  General: well developed, well nourished, seated, in no evident distress Head: head normocephalic and atraumatic.   Neck: supple with no carotid or supraclavicular bruits Cardiovascular: regular rate and rhythm, no murmurs Musculoskeletal: R BKA with prosthetic in place Skin:  no rash/petichiae Vascular:  Normal pulses all extremities   Neurologic Exam Mental  Status: Awake and fully alert.   Fluent speech and language.  Oriented to place and time. Recent and remote memory intact. Attention span, concentration and fund of knowledge appropriate. Mood and affect appropriate.  Cranial Nerves: Fundoscopic exam reveals sharp disc margins. Pupils equal, briskly reactive to light. Extraocular  movements full without nystagmus. Visual fields full to confrontation. Hearing intact. Facial sensation intact. Face, tongue, palate moves normally and symmetrically.  Motor: Normal bulk and tone. Normal strength in all tested extremity muscles. Sensory.: intact to touch , pinprick , position and vibratory sensation.  Coordination: Rapid alternating movements normal in all extremities. Finger-to-nose performed accurately bilaterally and heel-to-shin performed accurately on left side (R BKA) Gait and Station: Arises from chair without difficulty. Stance is normal. Gait demonstrates  favoring of right leg due to prosthetic placement able to ambulate without assistive device Reflexes: 1+ and symmetric. Toes downgoing.          ASSESSMENT: Curtis Bradford is a 59 y.o. year old male here with acute left thalamic infarct on 10/30/17 secondary to small vessel disease. Vascular risk factors include CKD, DM, PVD Curtis Dimess/p R BKA, HLD and HTN.  He has been stable from a stroke standpoint with residual right-sided numbness/tingling.  Prior history of seizures and self discontinued Keppra approximately 6 months ago after running out of prescription and has been doing well since that time without any seizure type activity   PLAN: -Continue aspirin 81 mg daily and clopidogrel 75 mg daily  and atorvastatin 20 mg and Zetia 10 mg for secondary stroke prevention -refills provided but advised he needs to establish care with PCP for further refills -Referral placed to internal medicine to establish care with PCP -As he has been stable without Keppra, will hold off on restarting with low threshold on restarting -Advised to continue to stay active and maintain a healthy diet -Maintain strict control of hypertension with blood pressure goal below 130/90, diabetes with hemoglobin A1c goal below 6.5% and cholesterol with LDL cholesterol (bad cholesterol) goal below 70 mg/dL. I also advised the patient to eat a healthy diet with  plenty of whole grains, cereals, fruits and vegetables, exercise regularly and maintain ideal body weight.  Stable from a neurological standpoint and advised to follow-up as needed   I spent 20 minutes of face-to-face and non-face-to-face time with patient.  This included previsit chart review, lab review, study review, order entry, electronic health record documentation, patient education    George HughJessica Kolby Schara, [facility] Desert CanyonGNP-BC  [facility] [address] [place], [place] [id number]  Phone [phone] Fax [phone]

## 2020-03-30 ENCOUNTER — Other Ambulatory Visit: Payer: Self-pay | Admitting: Adult Health

## 2020-03-31 ENCOUNTER — Other Ambulatory Visit: Payer: Self-pay | Admitting: Adult Health

## 2020-03-31 NOTE — Telephone Encounter (Signed)
Called and LMVM for pt that received request for zetia and atovastatin.  JM/NP refill when in but noted to get future refills from pcp, referral placed for IM Dr. Tenny Craw.  Did you call and set up appt, seeing them now?? Please return call to discuss.

## 2020-03-31 NOTE — Telephone Encounter (Signed)
I called pt.  He has not been able to find a pcp.  I gave him the [facility] physicians (dr Duane Lope).  From website he was not taking new pts but I explained there are other providers for him to choose from that practice.  I told him I will fill for 2-3 months for him to find one.  He appreciated this.  I LMVM for gaynell parker to this pt felt like he had account balance and wanted to pay on it.  I relayed to pt.

## 2020-03-31 NOTE — Telephone Encounter (Signed)
Pt has called Sandy, RN back.  Please call 

## 2020-04-07 NOTE — Telephone Encounter (Signed)
Error

## 2020-04-20 ENCOUNTER — Ambulatory Visit (INDEPENDENT_AMBULATORY_CARE_PROVIDER_SITE_OTHER): Payer: Managed Care, Other (non HMO) | Admitting: Family

## 2020-04-20 ENCOUNTER — Encounter: Payer: Self-pay | Admitting: Family

## 2020-04-20 DIAGNOSIS — Z89511 Acquired absence of right leg below knee: Secondary | ICD-10-CM

## 2020-04-20 NOTE — Progress Notes (Signed)
Office Visit Note   Patient: Curtis Bradford           Date of Birth: 05/01/1961           MRN: [id number] Visit Date: 04/20/2020              Requested by: No referring provider defined for this encounter. PCP: Patient, No Pcp Per  No chief complaint on file.     HPI: The patient is a 59 year old gentleman seen today for evaluation of his right residual limb.  His current socket is ill fitting this is quite loose he has been having pain with ambulation he is currently wearing 20 ply and can still fit his fingers down in his socket between his leg and his socket.  He states that he is due for new socket but unfortunately does not have a order for a new one.  He is unable to safely walk in the community without fear of falling because of the ill fitting prosthesis  Does not currently have any wounds that he is concerned about.    Assessment & Plan: Visit Diagnoses:  1. Acquired absence of right lower extremity below knee [facility])     Plan: He will follow with Trey Paula.  Did provide an order for new socket and supplies.  Follow-Up Instructions: Return if symptoms worsen or fail to improve.   Ortho Exam  [name] alert, oriented, no adenopathy, well-dressed, normal affect, normal respiratory effort. On examination of the right residual limb this is well consolidated and well-healed.  There is no open area. No callus. No impending skin breakdown  Imaging: No results found. No images are attached to the encounter.  Labs: Lab Results  Component Value Date   HGBA1C 7.5 (H) 09/17/2018   HGBA1C 8.3 (H) 07/23/2018   HGBA1C 7.4 (H) 04/23/2018   ESRSEDRATE 31 (H) 02/14/2017   CRP 12.3 (H) 02/14/2017   REPTSTATUS 03/13/2019 FINAL 03/11/2019   CULT  03/11/2019    NO GROWTH Performed at [facility], [address] [place], [place] [address]      Lab Results  Component Value Date   ALBUMIN 3.9 10/22/2018   ALBUMIN 3.0 (L) 09/22/2018   ALBUMIN 4.1 01/31/2018   PREALBUMIN  13.8 (L) 02/15/2017    No results found for: MG No results found for: VD25OH  Lab Results  Component Value Date   PREALBUMIN 13.8 (L) 02/15/2017   CBC EXTENDED Latest Ref Rng & Units 10/22/2018 09/26/2018 09/22/2018  WBC 4.0 - 10.5 K/uL 21.7(H) 10.1 9.9  RBC 4.22 - 5.81 MIL/uL 4.86 3.52(L) 3.46(L)  HGB 13.0 - 17.0 g/dL 72.5 10.2(L) 10.0(L)  HCT 39 - 52 % 44.1 32.0(L) 31.5(L)  PLT 150 - 400 K/uL 361 460(H) 375  NEUTROABS 1.7 - 7.7 K/uL 16.9(H) 6.5 6.2  LYMPHSABS 0.7 - 4.0 K/uL 2.6 1.9 1.9     There is no height or weight on file to calculate BMI.  Orders:  No orders of the defined types were placed in this encounter.  No orders of the defined types were placed in this encounter.    Procedures: No procedures performed  Clinical Data: No additional findings.  ROS:  All other systems negative, except as noted in the HPI. Review of Systems  Constitutional: Negative for chills and fever.  Cardiovascular: Positive for leg swelling.  Skin: Negative for color change, rash and wound.    Objective: Vital Signs: There were no vitals taken for this visit.  Specialty  Comments:  No specialty comments available.  PMFS History: Patient Active Problem List   Diagnosis Date Noted  . S/P BKA (below knee amputation) unilateral, right (HCC) 10/23/2018  . Intermittent pain   . Phantom limb pain (HCC)   . CKD (chronic kidney disease), stage III (HCC)   . Labile blood glucose   . Acute blood loss anemia   . Hypoalbuminemia due to protein-calorie malnutrition (HCC)   . Diabetes mellitus type 2 in nonobese (HCC)   . Postoperative pain   . Neuropathic pain   . Seizures (HCC)   . Right below-knee amputee (HCC) 09/19/2018  . Below knee amputation (HCC) 09/17/2018  . Status post transmetatarsal amputation of foot, right (HCC) 07/23/2018  . Osteomyelitis (HCC) 07/23/2018  . TIA (transient ischemic attack) 10/30/2017  . Medication monitoring encounter 03/28/2017  . Status post  amputation of toe of right foot (HCC) 03/05/2017  . Subacute osteomyelitis, right ankle and foot (HCC)   . Peripheral neuropathy 02/16/2017  . Peripheral artery disease (HCC) 02/16/2017  . 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 Diabetic foot infection (HCC) 02/14/2017  . Type II diabetes mellitus with renal manifestations (HCC)   . HLD (hyperlipidemia)   . Tobacco abuse   . CKD stage 2 due to type 2 diabetes mellitus (HCC)   . CAD (coronary artery disease)   . Seizure disorder [facility])    Past Medical History:  Diagnosis Date  . CAD (coronary artery disease)   . CKD (chronic kidney disease), stage III (HCC)   . 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 History of DVT (deep vein thrombosis)   . History of kidney stones    passed stones - no surgery  . HLD (hyperlipidemia)   . Myocardial infarction (HCC) 2009  . Peripheral vascular disease (HCC)    right leg  . Seizure (HCC)    last one 2015  . Stroke [facility]) 2012, 10/2017   2012-speech was effected- has come back.10/2017- numbness of right side no taste right  side. 2012-  . Tobacco abuse     Family History  Problem Relation Age of Onset  . Diabetes Mellitus II Mother   . Diabetes Mellitus II Father   . Colon cancer Neg Hx   . Esophageal cancer Neg Hx     Past Surgical History:  Procedure Laterality Date  . AMPUTATION Right 02/23/2017   Procedure: RIGHT FOOT 5TH RAY AMPUTATION;  Surgeon: [name], MD;  Location: [facility] OR;  Service: Orthopedics;  Laterality: Right;  . AMPUTATION Right 04/23/2018   Procedure: Right Great Toe Amputation;  Surgeon: [name], MD;  Location: [facility] OR;  Service: Orthopedics;  Laterality: Right;  . AMPUTATION Right 07/23/2018   Procedure: RIGHT TRANSMETATARSAL AMPUTATION, APPLY WOUND VAC;  Surgeon: [name], MD;  Location: [facility] OR;  Service: Orthopedics;  Laterality: Right;  . AMPUTATION Right 09/17/2018   Procedure: RIGHT AMPUTATION BELOW KNEE;   Surgeon: [name], MD;  Location: [facility] [place] OR;  Service: Orthopedics;  Laterality: Right;  . APPLICATION OF WOUND VAC Right 09/17/2018   Procedure: Application Of Wound Vac;  Surgeon: [name], MD;  Location: [facility] OR;  Service: Orthopedics;  Laterality: Right;  . BELOW KNEE LEG AMPUTATION Right 09/17/2018  . CORONARY ANGIOPLASTY  03/05/2008   stent  . FOOT SURGERY    . PERIPHERAL ARTERIAL STENT GRAFT Right 2014   leg 2 stents- clotted per pat  .  STENT PLACEMENT VASCULAR ([facility] HX)     Several in right leg  . TONSILLECTOMY    . TRANSMETATARSAL AMPUTATION Right 07/23/2018   Social History   Occupational History  . Not on file  Tobacco Use  . Smoking status: Light Tobacco Smoker    Packs/day: 0.25    Years: 15.00    Pack years: 3.75    Types: Cigarettes  . Smokeless tobacco: Never Used  . Tobacco comment: 2 or 3 a day   Vaping Use  . Vaping Use: Never used  Substance and Sexual Activity  . Alcohol use: Not Currently  . Drug use: No  . Sexual activity: Not on file

## 2020-07-22 ENCOUNTER — Ambulatory Visit (INDEPENDENT_AMBULATORY_CARE_PROVIDER_SITE_OTHER): Payer: Managed Care, Other (non HMO) | Admitting: Podiatry

## 2020-07-22 ENCOUNTER — Other Ambulatory Visit: Payer: Self-pay

## 2020-07-22 DIAGNOSIS — E1122 Type 2 diabetes mellitus with diabetic chronic kidney disease: Secondary | ICD-10-CM

## 2020-07-22 DIAGNOSIS — M216X9 Other acquired deformities of unspecified foot: Secondary | ICD-10-CM

## 2020-07-22 DIAGNOSIS — M21962 Unspecified acquired deformity of left lower leg: Secondary | ICD-10-CM

## 2020-07-22 DIAGNOSIS — Z89511 Acquired absence of right leg below knee: Secondary | ICD-10-CM | POA: Diagnosis not present

## 2020-07-22 DIAGNOSIS — M2042 Other hammer toe(s) (acquired), left foot: Secondary | ICD-10-CM | POA: Diagnosis not present

## 2020-07-22 DIAGNOSIS — N182 Chronic kidney disease, stage 2 (mild): Secondary | ICD-10-CM

## 2020-07-22 NOTE — Progress Notes (Signed)
  Subjective:  Patient ID: Curtis Bradford, male    DOB: 07/21/1960,  MRN: [id number]  Chief Complaint  Patient presents with  . Callouses    Pre-ulcerative callous left plantar forefoot sub 1st.    60 y.o. male presents with the above complaint. History confirmed with patient. Unsure of chronicity. Lost right leg several years ago.  Objective:  Physical Exam: warm, good capillary refill, no trophic changes or ulcerative lesions, normal DP and PT pulses and normal sensory exam. Left Foot: Severe cavus foot type, plantarflexed 1st met with pre-ulcer without open ulcer upon debridement. Lesser digit hammertoes. Right Foot: Hx of BKA     Assessment:   1. Cavus deformity of foot, acquired   2. Hammertoe of left foot   3. Metatarsal deformity, left   4. Hx of BKA, right ([place])   5. CKD stage 2 due to type 2 diabetes mellitus ([place])      Plan:  Patient was evaluated and treated and all questions answered.  Left Pre-ulcer -No open wound noted upon debridement -High risk pre-ulcer, would benefit from DM shoes. -Will f/u in 1 month to ensure no skin breakdown. Will make appt for DM shoes. -Would consider surgical intervention should this recur or worsen and DM shoes unable to offload the ulcer.  Return in about 1 month (around 08/22/2020) for Pre-ulcer f/u.

## 2020-08-01 ENCOUNTER — Ambulatory Visit: Payer: Managed Care, Other (non HMO) | Admitting: Orthotics

## 2020-08-01 ENCOUNTER — Other Ambulatory Visit: Payer: Self-pay

## 2020-08-01 DIAGNOSIS — M2042 Other hammer toe(s) (acquired), left foot: Secondary | ICD-10-CM

## 2020-08-01 DIAGNOSIS — M216X9 Other acquired deformities of unspecified foot: Secondary | ICD-10-CM

## 2020-08-01 DIAGNOSIS — M21962 Unspecified acquired deformity of left lower leg: Secondary | ICD-10-CM

## 2020-08-01 NOTE — Progress Notes (Signed)
[name] going to [facility] since Rosann Auerbach will not pay for f/o

## 2020-08-26 ENCOUNTER — Ambulatory Visit (INDEPENDENT_AMBULATORY_CARE_PROVIDER_SITE_OTHER): Payer: Managed Care, Other (non HMO) | Admitting: Podiatry

## 2020-08-26 ENCOUNTER — Other Ambulatory Visit: Payer: Self-pay

## 2020-08-26 DIAGNOSIS — M216X9 Other acquired deformities of unspecified foot: Secondary | ICD-10-CM | POA: Diagnosis not present

## 2020-08-26 DIAGNOSIS — M2042 Other hammer toe(s) (acquired), left foot: Secondary | ICD-10-CM

## 2020-08-29 NOTE — Progress Notes (Signed)
  Subjective:  Patient ID: Curtis Bradford, male    DOB: 12-Jun-1961,  MRN: [id number]  No chief complaint on file.   60 y.o. male presents with the above complaint. History confirmed with patient. History of right BKA.  States that the sore area is doing better  Objective:  Physical Exam: warm, good capillary refill, no trophic changes or ulcerative lesions, normal DP and PT pulses and normal sensory exam. Left Foot: Severe cavus foot type, plantarflexed 1st met with pre-ulcer without open ulcer upon debridement. Lesser digit hammertoes. Right Foot: Hx of BKA     Assessment:   1. Cavus deformity of foot, acquired   2. Hammertoe of left foot      Plan:  Patient was evaluated and treated and all questions answered.  Left Pre-ulcer -No open wound noted.  Awaiting diabetic shoes.  He is at high risk of having further issues with his foot.  Patient to advised should this area ulcerate. Follow-up in 1 month for recheck No follow-ups on file.

## 2020-10-11 ENCOUNTER — Ambulatory Visit: Payer: Managed Care, Other (non HMO) | Admitting: Podiatry

## 2020-10-14 ENCOUNTER — Ambulatory Visit (INDEPENDENT_AMBULATORY_CARE_PROVIDER_SITE_OTHER): Payer: Managed Care, Other (non HMO) | Admitting: Podiatry

## 2020-10-14 ENCOUNTER — Other Ambulatory Visit: Payer: Self-pay

## 2020-10-14 DIAGNOSIS — M2042 Other hammer toe(s) (acquired), left foot: Secondary | ICD-10-CM | POA: Diagnosis not present

## 2020-10-14 DIAGNOSIS — M21962 Unspecified acquired deformity of left lower leg: Secondary | ICD-10-CM

## 2020-10-14 DIAGNOSIS — M216X9 Other acquired deformities of unspecified foot: Secondary | ICD-10-CM

## 2020-10-27 NOTE — Progress Notes (Signed)
  Subjective:  Patient ID: Curtis Bradford, male    DOB: 1960-06-27,  MRN: [id number]  Chief Complaint  Patient presents with  . Diabetes    6 week follow up, pre-ulcerative callus left toe  . Hammer Toe    60 y.o. male presents with the above complaint. History confirmed with patient. History of right BKA.  Objective:  Physical Exam: warm, good capillary refill, no trophic changes or ulcerative lesions, normal DP and PT pulses and normal sensory exam. Left Foot: Severe cavus foot type, plantarflexed 1st met with pre-ulcer without open ulcer upon debridement. Lesser digit hammertoes. Right Foot: Hx of BKA   Assessment:   1. Cavus deformity of foot, acquired   2. Hammertoe of left foot   3. Metatarsal deformity, left     Plan:  Patient was evaluated and treated and all questions answered.  Left Pre-ulcer -No lesion noted today.  Wound debrided to patient relief.  Is at high risk of ulceration and amputation.  We will follow-up for wound surveillance in 3 weeks No follow-ups on file.

## 2020-11-15 ENCOUNTER — Ambulatory Visit (INDEPENDENT_AMBULATORY_CARE_PROVIDER_SITE_OTHER): Payer: Managed Care, Other (non HMO) | Admitting: Podiatry

## 2020-11-15 ENCOUNTER — Other Ambulatory Visit: Payer: Self-pay

## 2020-11-15 DIAGNOSIS — I6990 Unspecified sequelae of unspecified cerebrovascular disease: Secondary | ICD-10-CM | POA: Insufficient documentation

## 2020-11-15 DIAGNOSIS — E559 Vitamin D deficiency, unspecified: Secondary | ICD-10-CM | POA: Insufficient documentation

## 2020-11-15 DIAGNOSIS — Z87898 Personal history of other specified conditions: Secondary | ICD-10-CM | POA: Insufficient documentation

## 2020-11-15 DIAGNOSIS — L97521 Non-pressure chronic ulcer of other part of left foot limited to breakdown of skin: Secondary | ICD-10-CM | POA: Insufficient documentation

## 2020-11-15 DIAGNOSIS — N401 Enlarged prostate with lower urinary tract symptoms: Secondary | ICD-10-CM | POA: Insufficient documentation

## 2020-11-15 DIAGNOSIS — R748 Abnormal levels of other serum enzymes: Secondary | ICD-10-CM | POA: Insufficient documentation

## 2020-11-15 DIAGNOSIS — M216X9 Other acquired deformities of unspecified foot: Secondary | ICD-10-CM | POA: Diagnosis not present

## 2020-11-15 DIAGNOSIS — Z794 Long term (current) use of insulin: Secondary | ICD-10-CM | POA: Insufficient documentation

## 2020-11-15 DIAGNOSIS — M2042 Other hammer toe(s) (acquired), left foot: Secondary | ICD-10-CM | POA: Diagnosis not present

## 2020-11-15 DIAGNOSIS — R809 Proteinuria, unspecified: Secondary | ICD-10-CM | POA: Insufficient documentation

## 2020-11-15 DIAGNOSIS — K921 Melena: Secondary | ICD-10-CM | POA: Insufficient documentation

## 2020-11-15 DIAGNOSIS — I2510 Atherosclerotic heart disease of native coronary artery without angina pectoris: Secondary | ICD-10-CM | POA: Insufficient documentation

## 2020-11-15 DIAGNOSIS — M21962 Unspecified acquired deformity of left lower leg: Secondary | ICD-10-CM

## 2020-11-15 DIAGNOSIS — R739 Hyperglycemia, unspecified: Secondary | ICD-10-CM | POA: Insufficient documentation

## 2020-11-15 DIAGNOSIS — Z89421 Acquired absence of other right toe(s): Secondary | ICD-10-CM | POA: Insufficient documentation

## 2020-11-15 NOTE — Progress Notes (Signed)
  Subjective:  Patient ID: Curtis Bradford, male    DOB: 03/18/1961,  MRN: [id number]  Chief Complaint  Patient presents with  . Wound Check    Denies any f/c/n/v/concerns.    60 y.o. male presents with the above complaint. History confirmed with patient. History of right BKA.  Objective:  Physical Exam: warm, good capillary refill, no trophic changes or ulcerative lesions, normal DP and PT pulses and normal sensory exam. Left Foot: Severe cavus foot type, plantarflexed 1st met with pre-ulcer without open ulcer upon debridement. Lesser digit hammertoes. Right Foot: Hx of BKA   Assessment:   1. Cavus deformity of foot, acquired   2. Hammertoe of left foot   3. Metatarsal deformity, left     Plan:  Patient was evaluated and treated and all questions answered.  Left Pre-ulcer -Again no ulcerations noted today. -Gently debrided excess HPK, courtesy -F/u in 1 month to ensure that the wounds have not recurred now that he has his DM shoes.  No follow-ups on file.

## 2020-12-13 ENCOUNTER — Ambulatory Visit (INDEPENDENT_AMBULATORY_CARE_PROVIDER_SITE_OTHER): Payer: Managed Care, Other (non HMO) | Admitting: Podiatry

## 2020-12-13 DIAGNOSIS — Z5329 Procedure and treatment not carried out because of patient's decision for other reasons: Secondary | ICD-10-CM

## 2020-12-13 NOTE — Progress Notes (Signed)
No show for appt. 

## 2020-12-20 ENCOUNTER — Ambulatory Visit (INDEPENDENT_AMBULATORY_CARE_PROVIDER_SITE_OTHER): Payer: Managed Care, Other (non HMO) | Admitting: Podiatry

## 2020-12-20 ENCOUNTER — Other Ambulatory Visit: Payer: Self-pay

## 2020-12-20 DIAGNOSIS — M216X9 Other acquired deformities of unspecified foot: Secondary | ICD-10-CM | POA: Diagnosis not present

## 2020-12-20 DIAGNOSIS — M21962 Unspecified acquired deformity of left lower leg: Secondary | ICD-10-CM | POA: Diagnosis not present

## 2020-12-20 DIAGNOSIS — M2042 Other hammer toe(s) (acquired), left foot: Secondary | ICD-10-CM

## 2020-12-20 NOTE — Progress Notes (Signed)
  Subjective:  Patient ID: Curtis Bradford, male    DOB: 1960/06/27,  MRN: [id number]  Chief Complaint  Patient presents with   Wound Check    Denies f/c/n/v. Denies any pain.    60 y.o. male presents with the above complaint. History confirmed with patient. History of right BKA.  Objective:  Physical Exam: warm, good capillary refill, no trophic changes or ulcerative lesions, normal DP and PT pulses and normal sensory exam. Left Foot: Severe cavus foot type, plantarflexed 1st met with pre-ulcer without open ulcer upon debridement. Lesser digit hammertoes. Right Foot: Hx of BKA   Assessment:   1. Cavus deformity of foot, acquired   2. Hammertoe of left foot   3. Metatarsal deformity, left      Plan:  Patient was evaluated and treated and all questions answered.  Left Pre-ulcer -Debrided HPK, no open ulcer -DM inserts seem to be doing well. -F/u in 1 month for recheck to make sure that these have not worsened   Return for Wound Care.

## 2021-01-20 ENCOUNTER — Ambulatory Visit (INDEPENDENT_AMBULATORY_CARE_PROVIDER_SITE_OTHER): Payer: Managed Care, Other (non HMO) | Admitting: Podiatry

## 2021-01-20 ENCOUNTER — Other Ambulatory Visit: Payer: Self-pay

## 2021-01-20 DIAGNOSIS — E1169 Type 2 diabetes mellitus with other specified complication: Secondary | ICD-10-CM

## 2021-01-20 DIAGNOSIS — E1151 Type 2 diabetes mellitus with diabetic peripheral angiopathy without gangrene: Secondary | ICD-10-CM | POA: Diagnosis not present

## 2021-01-20 DIAGNOSIS — L84 Corns and callosities: Secondary | ICD-10-CM

## 2021-01-20 DIAGNOSIS — B351 Tinea unguium: Secondary | ICD-10-CM

## 2021-01-20 NOTE — Progress Notes (Signed)
  Subjective:  Patient ID: Curtis Bradford, male    DOB: May 08, 1961,  MRN: [id number]  Chief Complaint  Patient presents with   Foot Orthotics    Pt states diabetic inserts are comfortable and effective.   60 y.o. male presents with the above complaint. History confirmed with patient. History of right BKA.  Objective:  Physical Exam: warm, good capillary refill, no trophic changes or ulcerative lesions, normal DP and PT pulses and normal sensory exam. Left Foot: Severe cavus foot type, plantarflexed 1st met with pre-ulcer without open ulcer upon debridement. Lesser digit hammertoes. Right Foot: Hx of BKA   Assessment:   1. Onychomycosis of multiple toenails with type 2 diabetes mellitus and peripheral angiopathy (HCC)   2. Callus    Plan:  Patient was evaluated and treated and all questions answered.   Onychomycosis, Diabetes, and PAD -[name] diabetic with a qualifying condition for at risk foot care.  Procedure: Nail Debridement Type of Debridement: manual, sharp debridement. Instrumentation: Nail nipper, rotary burr. Number of Nails: 5  Procedure: Paring of Lesion Rationale: painful hyperkeratotic lesion Type of Debridement: manual, sharp debridement. Instrumentation: 312 blade Number of Lesions: 2  Return in about 9 weeks (around 03/24/2021) for Diabetic Foot Care.

## 2021-03-24 ENCOUNTER — Ambulatory Visit (INDEPENDENT_AMBULATORY_CARE_PROVIDER_SITE_OTHER): Payer: Managed Care, Other (non HMO) | Admitting: Podiatry

## 2021-03-24 ENCOUNTER — Other Ambulatory Visit: Payer: Self-pay

## 2021-03-24 DIAGNOSIS — M21962 Unspecified acquired deformity of left lower leg: Secondary | ICD-10-CM

## 2021-03-24 DIAGNOSIS — E1151 Type 2 diabetes mellitus with diabetic peripheral angiopathy without gangrene: Secondary | ICD-10-CM | POA: Diagnosis not present

## 2021-03-28 ENCOUNTER — Ambulatory Visit: Payer: Self-pay

## 2021-03-28 ENCOUNTER — Encounter: Payer: Self-pay | Admitting: Orthopaedic Surgery

## 2021-03-28 ENCOUNTER — Ambulatory Visit (INDEPENDENT_AMBULATORY_CARE_PROVIDER_SITE_OTHER): Payer: Managed Care, Other (non HMO) | Admitting: Orthopaedic Surgery

## 2021-03-28 DIAGNOSIS — S161XXA Strain of muscle, fascia and tendon at neck level, initial encounter: Secondary | ICD-10-CM

## 2021-03-28 DIAGNOSIS — S39012A Strain of muscle, fascia and tendon of lower back, initial encounter: Secondary | ICD-10-CM | POA: Diagnosis not present

## 2021-03-28 MED ORDER — PREDNISONE 5 MG (21) PO TBPK
ORAL_TABLET | ORAL | 0 refills | Status: DC
Start: 2021-03-28 — End: 2022-07-24

## 2021-03-28 MED ORDER — METHOCARBAMOL 500 MG PO TABS: 500.0000 mg | ORAL_TABLET | Freq: Two times a day (BID) | ORAL | 0 refills | Status: DC | PRN
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
## 2021-03-31 NOTE — Progress Notes (Signed)
  Subjective:  Patient ID: Curtis Bradford, male    DOB: [birth year],  MRN: [id number]  Chief Complaint  Patient presents with   Callouses    Callous trim    60 y.o. male presents with the above complaint. History confirmed with patient. History of right BKA.  Objective:  Physical Exam: warm, good capillary refill, no trophic changes or ulcerative lesions, normal DP and PT pulses and normal sensory exam. Left Foot: Severe cavus foot type, plantarflexed 1st met with pre-ulcer without open ulcer upon debridement. Lesser digit hammertoes. Right Foot: Hx of BKA   Assessment:   1. Metatarsal deformity, left   2. Diabetes mellitus type 2 with peripheral artery disease ([place])     Plan:  Patient was evaluated and treated and all questions answered.   Onychomycosis, Diabetes, and PAD -Patient presents with recurrent callus to the plantar aspect of the right foot without open ulcer.  This was gently debrided today.  We will continue to benefit from monitoring for signs ulceration.  He is at high risk of infection and amputation with contralateral BKA  No follow-ups on file.

## 2021-04-11 ENCOUNTER — Other Ambulatory Visit: Payer: Self-pay

## 2021-04-11 ENCOUNTER — Ambulatory Visit (INDEPENDENT_AMBULATORY_CARE_PROVIDER_SITE_OTHER): Payer: Managed Care, Other (non HMO) | Admitting: Physical Therapy

## 2021-04-11 ENCOUNTER — Encounter: Payer: Self-pay | Admitting: Physical Therapy

## 2021-04-11 DIAGNOSIS — M545 Low back pain, unspecified: Secondary | ICD-10-CM

## 2021-04-11 DIAGNOSIS — M542 Cervicalgia: Secondary | ICD-10-CM
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
## 2021-04-11 NOTE — Patient Instructions (Signed)
Access Code: [email or web address] URL: [email or web address] Date: 04/11/2021 Prepared by: Ivery Quale  Exercises Seated Upper Trapezius Stretch - 2 x daily - 6 x weekly - 1 sets - 2-3 reps - 30 sec hold Seated Levator Scapulae Stretch - 2 x daily - 6 x weekly - 2-3 reps - 30 hold Sternocleidomastoid Stretch - 2 x daily - 6 x weekly - 1 sets - 2-3 reps - 30 hold Standing I's, Y's, T's - 2 x daily - 6 x weekly - 1 sets - 10 reps Hooklying Single Knee to Chest Stretch - 2 x daily - 6 x weekly - 1 sets - 3 reps - 20 hold Supine Lower Trunk Rotation - 2 x daily - 6 x weekly - 1 sets - 10 reps - 5 sec hold Supine Bridge - 2 x daily - 6 x weekly - 1-2 sets - 10 reps - 5 hold Supine Active Straight Leg Raise - 2 x daily - 6 x weekly - 2-3 sets - 10 reps

## 2021-04-17 ENCOUNTER — Encounter: Payer: Self-pay | Admitting: Orthopedic Surgery

## 2021-04-17 ENCOUNTER — Ambulatory Visit (INDEPENDENT_AMBULATORY_CARE_PROVIDER_SITE_OTHER): Payer: Managed Care, Other (non HMO) | Admitting: Orthopedic Surgery

## 2021-04-17 DIAGNOSIS — Z89511 Acquired absence of right leg below knee: Secondary | ICD-10-CM | POA: Diagnosis not present

## 2021-04-17 NOTE — Progress Notes (Signed)
Office Visit Note   Patient: Curtis Bradford           Date of Birth: 23-Jan-1961           MRN: [id number] Visit Date: 04/17/2021              Requested by: Charlane Ferretti, DO [address] [place],  [place] [address] PCP: Charlane Ferretti, DO  Chief Complaint  Patient presents with   Right Leg - Follow-up    Prosthetic evaluation      HPI: [name] a 60 year old gentleman who is seen for evaluation for his right transtibial amputation.  [name] over 2 years out from surgery.  He has been extremely active with golf and other activities he has broken down on his sleeve and liner.  There is instability with the socket.  Assessment & Plan: Visit Diagnoses:  1. Acquired absence of right lower extremity below knee [facility])     Plan: [name] given a new prescription for liner materials and supplies.  Patient may require a new socket due to the decreased volume.  Follow-Up Instructions: Return if symptoms worsen or fail to improve.   Ortho Exam  [name] alert, oriented, no adenopathy, well-dressed, normal affect, normal respiratory effort. Examination patient has progressive atrophy of the anterior compartment.  The fibular head is now prominent but there is no ulcers.  The remainder of the residual limb has shown decreased volume.  [name] an existing right transtibial  amputee.  Patient's current comorbidities are not expected to impact the ability to function with the prescribed prosthesis. Patient verbally communicates a strong desire to use a prosthesis. Patient currently requires mobility aids to ambulate without a prosthesis.  Expects not to use mobility aids with a new prosthesis.  [name] a K3 level ambulator that spends a lot of time walking around on uneven terrain over obstacles, up and down stairs, and ambulates with a variable cadence.     Imaging: No results found. No images are attached to the encounter.  Labs: Lab Results  Component Value Date    HGBA1C 7.5 (H) 09/17/2018   HGBA1C 8.3 (H) 07/23/2018   HGBA1C 7.4 (H) 04/23/2018   ESRSEDRATE 31 (H) 02/14/2017   CRP 12.3 (H) 02/14/2017   REPTSTATUS 03/13/2019 FINAL 03/11/2019   CULT  03/11/2019    NO GROWTH Performed at [facility] Lab, [address], [place], [place] [address]      Lab Results  Component Value Date   ALBUMIN 3.9 10/22/2018   ALBUMIN 3.0 (L) 09/22/2018   ALBUMIN 4.1 01/31/2018   PREALBUMIN 13.8 (L) 02/15/2017    No results found for: MG No results found for: VD25OH  Lab Results  Component Value Date   PREALBUMIN 13.8 (L) 02/15/2017   CBC EXTENDED Latest Ref Rng & Units 10/22/2018 09/26/2018 09/22/2018  WBC 4.0 - 10.5 K/uL 21.7(H) 10.1 9.9  RBC 4.22 - 5.81 MIL/uL 4.86 3.52(L) 3.46(L)  HGB 13.0 - 17.0 g/dL 40.0 10.2(L) 10.0(L)  HCT 39.0 - 52.0 % 44.1 32.0(L) 31.5(L)  PLT 150 - 400 K/uL 361 460(H) 375  NEUTROABS 1.7 - 7.7 K/uL 16.9(H) 6.5 6.2  LYMPHSABS 0.7 - 4.0 K/uL 2.6 1.9 1.9     There is no height or weight on file to calculate BMI.  Orders:  No orders of the defined types were placed in this encounter.  No orders of the defined types were placed in this encounter.    Procedures: No procedures performed  Clinical Data: No additional findings.  ROS:  All other systems negative, except as noted in the HPI. Review of Systems  Objective: Vital Signs: There were no vitals taken for this visit.  Specialty Comments:  No specialty comments available.  PMFS History: Patient Active Problem List   Diagnosis Date Noted   Alkaline phosphatase raised 11/15/2020   Atherosclerosis of coronary artery without angina pectoris 11/15/2020   Benign prostatic hyperplasia with lower urinary tract symptoms 11/15/2020   Hyperglycemia 11/15/2020   Late effects of cerebrovascular disease 11/15/2020   Long term (current) use of insulin (HCC) 11/15/2020   Melena 11/15/2020   Microalbuminuria 11/15/2020   Personal history of other specified  conditions 11/15/2020   Right toe amputee (HCC) 11/15/2020   Ulcer of left foot, limited to breakdown of skin (HCC) 11/15/2020   Vitamin D deficiency 11/15/2020   S/P BKA (below knee amputation) unilateral, right (HCC) 10/23/2018   Intermittent pain    Phantom limb pain (HCC)    CKD (chronic kidney disease), stage III (HCC)    Labile blood glucose    Other disorders of plasma-protein metabolism, not elsewhere classified 09/22/2018   Acute blood loss anemia    Hypoalbuminemia due to protein-calorie malnutrition (HCC)    Diabetes mellitus type 2 in nonobese [facility])    Postoperative pain    Neuropathic pain    Seizures (HCC)    Right below-knee amputee (HCC) 09/19/2018   Below knee amputation (HCC) 09/17/2018   Status post transmetatarsal amputation of foot, right (HCC) 07/23/2018   Osteomyelitis (HCC) 07/23/2018   TIA (transient ischemic attack) 10/30/2017   Medication monitoring encounter 03/28/2017   Status post amputation of toe of right foot (HCC) 03/05/2017   Subacute osteomyelitis, right ankle and foot (HCC)    Peripheral neuropathy 02/16/2017   Peripheral artery disease (HCC) 02/16/2017   Type 2 diabetes mellitus with diabetic foot ulcer (HCC) 02/14/2017   Diabetic foot infection (HCC) 02/14/2017   Type II diabetes mellitus with renal manifestations (HCC)    HLD (hyperlipidemia)    Tobacco abuse    CKD stage 2 due to type 2 diabetes mellitus (HCC)    CAD (coronary artery disease)    Seizure disorder (HCC)    Past Medical History:  Diagnosis Date   CAD (coronary artery disease)    CKD (chronic kidney disease), stage III (HCC)    Diabetes mellitus without complication (HCC)    Type II   History of DVT (deep vein thrombosis)    History of kidney stones    passed stones - no surgery   HLD (hyperlipidemia)    Myocardial infarction (HCC) 2009   Peripheral vascular disease (HCC)    right leg   Seizure (HCC)    last one 2015   Stroke (HCC) 2012, 10/2017   2012-speech was  effected- has come back.10/2017- numbness of right side no taste right  side. 2012-   Tobacco abuse     Family History  Problem Relation Age of Onset   Diabetes Mellitus II Mother    Diabetes Mellitus II Father    Colon cancer Neg Hx    Esophageal cancer Neg Hx     Past Surgical History:  Procedure Laterality Date   AMPUTATION Right 02/23/2017   Procedure: RIGHT FOOT 5TH RAY AMPUTATION;  Surgeon: [name], MD;  Location: [facility] OR;  Service: Orthopedics;  Laterality: Right;   AMPUTATION Right 04/23/2018   Procedure: Right Great Toe Amputation;  Surgeon: [name], MD;  Location:  [facility] OR;  Service: Orthopedics;  Laterality: Right;   AMPUTATION Right 07/23/2018   Procedure: RIGHT TRANSMETATARSAL AMPUTATION, APPLY WOUND VAC;  Surgeon: [name], MD;  Location: [facility] OR;  Service: Orthopedics;  Laterality: Right;   AMPUTATION Right 09/17/2018   Procedure: RIGHT AMPUTATION BELOW KNEE;  Surgeon: [name], MD;  Location: [facility] OR;  Service: Orthopedics;  Laterality: Right;   APPLICATION OF WOUND VAC Right 09/17/2018   Procedure: Application Of Wound Vac;  Surgeon: [name], MD;  Location: [facility] OR;  Service: Orthopedics;  Laterality: Right;   BELOW KNEE LEG AMPUTATION Right 09/17/2018   CORONARY ANGIOPLASTY  03/05/2008   stent   FOOT SURGERY     PERIPHERAL ARTERIAL STENT GRAFT Right 2014   leg 2 stents- clotted per pat   STENT PLACEMENT VASCULAR ([facility] HX)     Several in right leg   TONSILLECTOMY     TRANSMETATARSAL AMPUTATION Right 07/23/2018   Social History   Occupational History   Not on file  Tobacco Use   Smoking status: Light Smoker    Packs/day: 0.25    Years: 15.00    Pack years: 3.75    Types: Cigarettes   Smokeless tobacco: Never   Tobacco comments:    2 or 3 a day   Vaping Use   Vaping Use: Never used  Substance and Sexual Activity   Alcohol use: Not Currently   Drug use: No   Sexual activity: Not on file

## 2021-04-20 ENCOUNTER — Other Ambulatory Visit: Payer: Self-pay

## 2021-04-20 ENCOUNTER — Ambulatory Visit (INDEPENDENT_AMBULATORY_CARE_PROVIDER_SITE_OTHER): Payer: Managed Care, Other (non HMO) | Admitting: Physical Therapy

## 2021-04-20 DIAGNOSIS — M545 Low back pain, unspecified: Secondary | ICD-10-CM

## 2021-04-20 DIAGNOSIS — M542 Cervicalgia: Secondary | ICD-10-CM

## 2021-04-20 NOTE — Therapy (Signed)
[facility] Physical Therapy [address] [place], [place], [address] Phone: [phone]   Fax:  [phone]  Physical Therapy Treatment  Patient Details  Name: Curtis Bradford MRN: [id number] Date of Birth: 27-Feb-1961 Referring Provider (PT): Cristie Hem, [place]   Encounter Date: 04/20/2021   PT End of Session - 04/20/21 1610     Visit Number 2    Number of Visits 12    Date for PT Re-Evaluation 05/23/21    PT Start Time 1516    PT Stop Time 1601    PT Time Calculation (min) 45 min    Activity Tolerance Patient tolerated treatment well    Behavior During Therapy [facility] for tasks assessed/performed             Past Medical History:  Diagnosis Date   CAD (coronary artery disease)    CKD (chronic kidney disease), stage III (HCC)    Diabetes mellitus without complication (HCC)    Type II   History of DVT (deep vein thrombosis)    History of kidney stones    passed stones - no surgery   HLD (hyperlipidemia)    Myocardial infarction [facility]) 2009   Peripheral vascular disease (HCC)    right leg   Seizure (HCC)    last one 2015   Stroke (HCC) 2012, 10/2017   2012-speech was effected- has come back.10/2017- numbness of right side no taste right  side. 2012-   Tobacco abuse     Past Surgical History:  Procedure Laterality Date   AMPUTATION Right 02/23/2017   Procedure: RIGHT FOOT 5TH RAY AMPUTATION;  Surgeon: [name], MD;  Location: [facility] OR;  Service: Orthopedics;  Laterality: Right;   AMPUTATION Right 04/23/2018   Procedure: Right Great Toe Amputation;  Surgeon: [name], MD;  Location: [facility] OR;  Service: Orthopedics;  Laterality: Right;   AMPUTATION Right 07/23/2018   Procedure: RIGHT TRANSMETATARSAL AMPUTATION, APPLY WOUND VAC;  Surgeon: [name], MD;  Location: [facility] OR;  Service: Orthopedics;  Laterality: Right;   AMPUTATION Right 09/17/2018   Procedure: RIGHT AMPUTATION BELOW KNEE;  Surgeon: [name], MD;  Location: [facility] OR;  Service:  Orthopedics;  Laterality: Right;   APPLICATION OF WOUND VAC Right 09/17/2018   Procedure: Application Of Wound Vac;  Surgeon: [name], MD;  Location: [facility] OR;  Service: Orthopedics;  Laterality: Right;   BELOW KNEE LEG AMPUTATION Right 09/17/2018   CORONARY ANGIOPLASTY  03/05/2008   stent   FOOT SURGERY     PERIPHERAL ARTERIAL STENT GRAFT Right 2014   leg 2 stents- clotted per pat   STENT PLACEMENT VASCULAR ([facility] HX)     Several in right leg   TONSILLECTOMY     TRANSMETATARSAL AMPUTATION Right 07/23/2018    There were no vitals filed for this visit.   Subjective Assessment - 04/20/21 1546     Subjective relays he is doing much better overall with pain but still having some pain and stiffeness mostly in left neck/upper trap area 1-2/10 overall. He was able to play 3 rounds of golf over the weekend which did not bother him and actually helped loosen him up he reports.    Pertinent History PMH:Rt BKA,CKD,DM,seizures,CAD,PVD,MI    Diagnostic tests X-rays demonstrate multilevel degenerative changes worse at C5-6, C6-7 and C7-T1. Straightening of the lumbar spine with multilevel degenerative changes worse at L4-5"    Patient Stated Goals reduce pain, play golf    Pain Onset 1 to 4 weeks ago  [facility] Adult PT Treatment/Exercise - 04/20/21 0001       Modalities   Modalities Traction      Traction   Type of Traction Cervical    Min (lbs) 15    Max (lbs) 22    Time 20 minutes      Manual Therapy   Manual therapy comments skilled palpation and active compression with DN              Trigger Point Dry Needling - 04/20/21 0001     Consent Given? Yes    Education Handout Provided Yes    Muscles Treated Head and Neck Upper trapezius;Suboccipitals;Cervical multifidi    Dry Needling Comments twitch reponse elicited, fair to good overall tolerance without immediate adverse reaction noted                     PT Short  Term Goals - 04/11/21 1534       PT SHORT TERM GOAL #1   Title Pt will be I and compliant with HEP.    Time 4    Period Weeks    Status New    Target Date 05/09/21               PT Long Term Goals - 04/11/21 1534       PT LONG TERM GOAL #1   Title Pt will improve neck ROM to WNL    Time 6    Period Weeks    Status New      PT LONG TERM GOAL #2   Title Pt will improve lumbar ROM to WNL.    Time 6    Period Weeks    Status New      PT LONG TERM GOAL #3   Title Pt will reduce overall pain to less than 3/10 with usual activity and report less overall tighntess in his back and neck    Status New                   Plan - 04/20/21 1611     Clinical Impression Statement He has made good overall early progress with his ROM showing decreased pain and improved ROM. We trialed him on mechanical traction and DN today as he still has tightness and mobility restrictions and trigger points in Lt neck/shoulder area. He had fair to good overall response to this and did have improved ROM after session.    Personal Factors and Comorbidities Comorbidity 3+    Comorbidities PMH:Rt BKA,CKD,DM,seizures,CAD,PVD,MI    Examination-Activity Limitations Lift;Stand;Stairs;Squat;Sleep;Bend;Carry    Examination-Participation Restrictions Community Activity;Driving;Occupation    Stability/Clinical Decision Making Evolving/Moderate complexity    Rehab Potential Good    PT Frequency 1x / week   1-2   PT Duration 6 weeks    PT Treatment/Interventions Cryotherapy;Electrical Stimulation;Iontophoresis 4mg /ml Dexamethasone;Moist Heat;Traction;Ultrasound;Therapeutic activities;Therapeutic exercise;Neuromuscular re-education;Manual techniques;Passive range of motion;Dry needling;Joint Manipulations;Spinal Manipulations;Taping    PT Next Visit Plan how was traction and DN from last time.  neck and lumbar mobility gains    PT Home Exercise Plan Access Code: [email or web address]    Consulted and Agree with Plan  of Care Patient             Patient will benefit from skilled therapeutic intervention in order to improve the following deficits and impairments:  Decreased activity tolerance, Decreased mobility, Decreased range of motion, Decreased strength, Difficulty walking, Postural dysfunction, Impaired flexibility, Pain, Increased muscle spasms  Visit Diagnosis: Cervicalgia  Acute bilateral low back  pain without sciatica     Problem List Patient Active Problem List   Diagnosis Date Noted   Alkaline phosphatase raised 11/15/2020   Atherosclerosis of coronary artery without angina pectoris 11/15/2020   Benign prostatic hyperplasia with lower urinary tract symptoms 11/15/2020   Hyperglycemia 11/15/2020   Late effects of cerebrovascular disease 11/15/2020   Long term (current) use of insulin (HCC) 11/15/2020   Melena 11/15/2020   Microalbuminuria 11/15/2020   Personal history of other specified conditions 11/15/2020   Right toe amputee (HCC) 11/15/2020   Ulcer of left foot, limited to breakdown of skin (HCC) 11/15/2020   Vitamin D deficiency 11/15/2020   S/P BKA (below knee amputation) unilateral, right (HCC) 10/23/2018   Intermittent pain    Phantom limb pain (HCC)    CKD (chronic kidney disease), stage III (HCC)    Labile blood glucose    Other disorders of plasma-protein metabolism, not elsewhere classified 09/22/2018   Acute blood loss anemia    Hypoalbuminemia due to protein-calorie malnutrition (HCC)    Diabetes mellitus type 2 in nonobese [facility])    Postoperative pain    Neuropathic pain    Seizures (HCC)    Right below-knee amputee (HCC) 09/19/2018   Below knee amputation (HCC) 09/17/2018   Status post transmetatarsal amputation of foot, right (HCC) 07/23/2018   Osteomyelitis (HCC) 07/23/2018   TIA (transient ischemic attack) 10/30/2017   Medication monitoring encounter 03/28/2017   Status post amputation of toe of right foot (HCC) 03/05/2017   Subacute osteomyelitis,  right ankle and foot (HCC)    Peripheral neuropathy 02/16/2017   Peripheral artery disease (HCC) 02/16/2017   Type 2 diabetes mellitus with diabetic foot ulcer (HCC) 02/14/2017   Diabetic foot infection (HCC) 02/14/2017   Type II diabetes mellitus with renal manifestations (HCC)    HLD (hyperlipidemia)    Tobacco abuse    CKD stage 2 due to type 2 diabetes mellitus (HCC)    CAD (coronary artery disease)    Seizure disorder (HCC)     April Manson, PT,DPT 04/20/2021, 4:13 PM  [facility] Physical Therapy [address] [place], [place], [address] Phone: [phone]   Fax:  [phone]  Name: Curtis Bradford MRN: [id number] Date of Birth: 01/19/1961

## 2021-04-25 ENCOUNTER — Ambulatory Visit (INDEPENDENT_AMBULATORY_CARE_PROVIDER_SITE_OTHER): Payer: Managed Care, Other (non HMO) | Admitting: Physical Therapy

## 2021-04-25 ENCOUNTER — Other Ambulatory Visit: Payer: Self-pay

## 2021-04-25 DIAGNOSIS — M545 Low back pain, unspecified: Secondary | ICD-10-CM | POA: Diagnosis not present

## 2021-04-25 DIAGNOSIS — M542 Cervicalgia: Secondary | ICD-10-CM | POA: Diagnosis not present

## 2021-04-25 NOTE — Therapy (Signed)
[facility] Physical Therapy [address] [place], [place], [address] Phone: [phone]   Fax:  [phone]  Physical Therapy Treatment  Patient Details  Name: Curtis Bradford MRN: [id number] Date of Birth: [birth year] Referring Provider (PT): Cristie Hem, [place]   Encounter Date: 04/25/2021   PT End of Session - 04/25/21 1516     Visit Number 3    Number of Visits 12    Date for PT Re-Evaluation 05/23/21    PT Start Time 1435    PT Stop Time 1517    PT Time Calculation (min) 42 min    Activity Tolerance Patient tolerated treatment well    Behavior During Therapy [facility] for tasks assessed/performed             Past Medical History:  Diagnosis Date   CAD (coronary artery disease)    CKD (chronic kidney disease), stage III (HCC)    Diabetes mellitus without complication (HCC)    Type II   History of DVT (deep vein thrombosis)    History of kidney stones    passed stones - no surgery   HLD (hyperlipidemia)    Myocardial infarction [facility]) 2009   Peripheral vascular disease (HCC)    right leg   Seizure (HCC)    last one 2015   Stroke (HCC) 2012, 10/2017   2012-speech was effected- has come back.10/2017- numbness of right side no taste right  side. 2012-   Tobacco abuse     Past Surgical History:  Procedure Laterality Date   AMPUTATION Right 02/23/2017   Procedure: RIGHT FOOT 5TH RAY AMPUTATION;  Surgeon: [name], MD;  Location: [facility] OR;  Service: Orthopedics;  Laterality: Right;   AMPUTATION Right 04/23/2018   Procedure: Right Great Toe Amputation;  Surgeon: [name], MD;  Location: [facility] OR;  Service: Orthopedics;  Laterality: Right;   AMPUTATION Right 07/23/2018   Procedure: RIGHT TRANSMETATARSAL AMPUTATION, APPLY WOUND VAC;  Surgeon: [name], MD;  Location: [facility] OR;  Service: Orthopedics;  Laterality: Right;   AMPUTATION Right 09/17/2018   Procedure: RIGHT AMPUTATION BELOW KNEE;  Surgeon: [name], MD;  Location: [facility] OR;  Service:  Orthopedics;  Laterality: Right;   APPLICATION OF WOUND VAC Right 09/17/2018   Procedure: Application Of Wound Vac;  Surgeon: [name], MD;  Location: [facility] OR;  Service: Orthopedics;  Laterality: Right;   BELOW KNEE LEG AMPUTATION Right 09/17/2018   CORONARY ANGIOPLASTY  03/05/2008   stent   FOOT SURGERY     PERIPHERAL ARTERIAL STENT GRAFT Right 2014   leg 2 stents- clotted per pat   STENT PLACEMENT VASCULAR ([facility] HX)     Several in right leg   TONSILLECTOMY     TRANSMETATARSAL AMPUTATION Right 07/23/2018    There were no vitals filed for this visit.   Subjective Assessment - 04/25/21 1512     Subjective relays his neck and back are feeling pretty good but he does have a trigger point on his Rt cervical paraspinal that when you push on, sends pain to the left suboccipital area    Pertinent History PMH:Rt BKA,CKD,DM,seizures,CAD,PVD,MI    Diagnostic tests X-rays demonstrate multilevel degenerative changes worse at C5-6, C6-7 and C7-T1. Straightening of the lumbar spine with multilevel degenerative changes worse at L4-5"    Patient Stated Goals reduce pain, play golf    Pain Onset 1 to 4 weeks ago  [place] Adult PT Treatment/Exercise - 04/25/21 0001       Exercises   Exercises Other Exercises    Other Exercises  seated cervical rotation AAROM with towel 5 sec X 10 bilat and cervical extension SNAG with towel 5 sec X10. seated mid trap/horizontal abduction with green X20, standing rows green X20      Traction   Type of Traction Cervical    Min (lbs) 17    Max (lbs) 22    Time 15 minutes      Manual Therapy   Manual therapy comments skilled palpation and active compression with DN to cervical              Trigger Point Dry Needling - 04/25/21 0001     Consent Given? Yes    Education Handout Provided Previously provided    Muscles Treated Head and Neck Cervical multifidi;Splenius capitus    Dry Needling Comments twitch  reponse elicited, fair to good overall tolerance without immediate adverse reaction noted                     PT Short Term Goals - 04/11/21 1534       PT SHORT TERM GOAL #1   Title Pt will be I and compliant with HEP.    Time 4    Period Weeks    Status New    Target Date 05/09/21               PT Long Term Goals - 04/11/21 1534       PT LONG TERM GOAL #1   Title Pt will improve neck ROM to WNL    Time 6    Period Weeks    Status New      PT LONG TERM GOAL #2   Title Pt will improve lumbar ROM to WNL.    Time 6    Period Weeks    Status New      PT LONG TERM GOAL #3   Title Pt will reduce overall pain to less than 3/10 with usual activity and report less overall tighntess in his back and neck    Status New                   Plan - 04/25/21 1601     Clinical Impression Statement Continued to focus on neck as this was his biggest complaint with left sided neck pain and trigger point. We again treated this with cervical traction and DN although he did have a litte more pain with the DN at time of treatment but then felt better after. Continue POC    Personal Factors and Comorbidities Comorbidity 3+    Comorbidities PMH:Rt BKA,CKD,DM,seizures,CAD,PVD,MI    Examination-Activity Limitations Lift;Stand;Stairs;Squat;Sleep;Bend;Carry    Examination-Participation Restrictions Community Activity;Driving;Occupation    Stability/Clinical Decision Making Evolving/Moderate complexity    Rehab Potential Good    PT Frequency 1x / week   1-2   PT Duration 6 weeks    PT Treatment/Interventions Cryotherapy;Electrical Stimulation;Iontophoresis 4mg /ml Dexamethasone;Moist Heat;Traction;Ultrasound;Therapeutic activities;Therapeutic exercise;Neuromuscular re-education;Manual techniques;Passive range of motion;Dry needling;Joint Manipulations;Spinal Manipulations;Taping    PT Next Visit Plan how was traction and DN from last time.  neck and lumbar mobility gains     PT Home Exercise Plan Access Code: [email or web address]    Consulted and Agree with Plan of Care Patient             Patient will benefit from skilled therapeutic intervention in order to improve the  following deficits and impairments:  Decreased activity tolerance, Decreased mobility, Decreased range of motion, Decreased strength, Difficulty walking, Postural dysfunction, Impaired flexibility, Pain, Increased muscle spasms  Visit Diagnosis: Cervicalgia  Acute bilateral low back pain without sciatica     Problem List Patient Active Problem List   Diagnosis Date Noted   Alkaline phosphatase raised 11/15/2020   Atherosclerosis of coronary artery without angina pectoris 11/15/2020   Benign prostatic hyperplasia with lower urinary tract symptoms 11/15/2020   Hyperglycemia 11/15/2020   Late effects of cerebrovascular disease 11/15/2020   Long term (current) use of insulin ([place]) 11/15/2020   Melena 11/15/2020   Microalbuminuria 11/15/2020   Personal history of other specified conditions 11/15/2020   Right toe amputee ([place]) 11/15/2020   Ulcer of left foot, limited to breakdown of skin ([place]) 11/15/2020   Vitamin D deficiency 11/15/2020   S/P BKA (below knee amputation) unilateral, right ([place]) 10/23/2018   Intermittent pain    Phantom limb pain (HCC)    CKD (chronic kidney disease), stage III ([place])    Labile blood glucose    Other disorders of plasma-protein metabolism, not elsewhere classified 09/22/2018   Acute blood loss anemia    Hypoalbuminemia due to protein-calorie malnutrition (HCC)    Diabetes mellitus type 2 in nonobese [facility])    Postoperative pain    Neuropathic pain    Seizures ([place])    Right below-knee amputee ([place]) 09/19/2018   Below knee amputation ([place]) 09/17/2018   Status post transmetatarsal amputation of foot, right ([place]) 07/23/2018   Osteomyelitis ([place]) 07/23/2018   TIA (transient ischemic attack) 10/30/2017   Medication monitoring encounter 03/28/2017   Status post  amputation of toe of right foot ([place]) 03/05/2017   Subacute osteomyelitis, right ankle and foot ([place])    Peripheral neuropathy 02/16/2017   Peripheral artery disease ([place]) 02/16/2017   Type 2 diabetes mellitus with diabetic foot ulcer ([place]) 02/14/2017   Diabetic foot infection ([place]) 02/14/2017   Type II diabetes mellitus with renal manifestations ([place])    HLD (hyperlipidemia)    Tobacco abuse    CKD stage 2 due to type 2 diabetes mellitus ([place])    CAD (coronary artery disease)    Seizure disorder ([place])     Debbe Odea, PT,DPT 04/25/2021, 4:03 PM  [facility] Physical Therapy [address] [place], [place], [address] Phone: [phone]   Fax:  [phone]  Name: Jayden Shultz MRN: [id number] Date of Birth: 1961-05-26

## 2021-05-02 ENCOUNTER — Ambulatory Visit (INDEPENDENT_AMBULATORY_CARE_PROVIDER_SITE_OTHER): Payer: Managed Care, Other (non HMO) | Admitting: Physical Therapy

## 2021-05-02 ENCOUNTER — Other Ambulatory Visit: Payer: Self-pay

## 2021-05-02 ENCOUNTER — Encounter: Payer: Self-pay | Admitting: Physical Therapy

## 2021-05-02 DIAGNOSIS — M545 Low back pain, unspecified: Secondary | ICD-10-CM | POA: Diagnosis not present

## 2021-05-02 DIAGNOSIS — M542 Cervicalgia: Secondary | ICD-10-CM | POA: Diagnosis not present

## 2021-05-02 NOTE — Therapy (Addendum)
[facility] [address] [place], [place], [address] Phone: [phone]   Fax:  [phone]  Physical Therapy Treatment Discharge  Patient Details  Name: Curtis Bradford MRN: [id number] Date of Birth: 05/13/1961 Referring Provider (PT): Aundra Dubin, [place]   Encounter Date: 05/02/2021   PT End of Session - 05/02/21 1449     Visit Number 4    Number of Visits 12    Date for PT Re-Evaluation 05/23/21    PT Start Time 5003    PT Stop Time 7048    PT Time Calculation (min) 40 min    Activity Tolerance Patient tolerated treatment well    Behavior During Therapy [facility] for tasks assessed/performed             Past Medical History:  Diagnosis Date   CAD (coronary artery disease)    CKD (chronic kidney disease), stage III ([place])    Diabetes mellitus without complication ([place])    Type II   History of DVT (deep vein thrombosis)    History of kidney stones    passed stones - no surgery   HLD (hyperlipidemia)    Myocardial infarction [facility]) 2009   Peripheral vascular disease ([place])    right leg   Seizure ([place])    last one 2015   Stroke ([place]) 2012, 10/2017   2012-speech was effected- has come back.10/2017- numbness of right side no taste right  side. 2012-   Tobacco abuse     Past Surgical History:  Procedure Laterality Date   AMPUTATION Right 02/23/2017   Procedure: RIGHT FOOT 5TH RAY AMPUTATION;  Surgeon: Newt Minion, MD;  Location: [place];  Service: Orthopedics;  Laterality: Right;   AMPUTATION Right 04/23/2018   Procedure: Right Great Toe Amputation;  Surgeon: Newt Minion, MD;  Location: [place];  Service: Orthopedics;  Laterality: Right;   AMPUTATION Right 07/23/2018   Procedure: RIGHT TRANSMETATARSAL AMPUTATION, APPLY WOUND VAC;  Surgeon: Newt Minion, MD;  Location: [place];  Service: Orthopedics;  Laterality: Right;   AMPUTATION Right 09/17/2018   Procedure: RIGHT AMPUTATION BELOW KNEE;  Surgeon: Newt Minion, MD;  Location: [place];   Service: Orthopedics;  Laterality: Right;   APPLICATION OF WOUND VAC Right 09/17/2018   Procedure: Application Of Wound Vac;  Surgeon: Newt Minion, MD;  Location: [place];  Service: Orthopedics;  Laterality: Right;   BELOW KNEE LEG AMPUTATION Right 09/17/2018   CORONARY ANGIOPLASTY  03/05/2008   stent   FOOT SURGERY     PERIPHERAL ARTERIAL STENT GRAFT Right 2014   leg 2 stents- clotted per pat   STENT PLACEMENT VASCULAR ([place] [facility])     Several in right leg   TONSILLECTOMY     TRANSMETATARSAL AMPUTATION Right 07/23/2018    There were no vitals filed for this visit.   Subjective Assessment - 05/02/21 1447     Subjective Pt arriving today reporting 1/10 pain in his neck. Pt stating he did not wish to perform DN again due to his last session was more painful than the first. Pt reporting overall improvements in his neck pain since beginning therapy.    Pertinent History PMH:Rt BKA,CKD,DM,seizures,CAD,PVD,MI    Diagnostic tests X-rays demonstrate multilevel degenerative changes worse at C5-6, C6-7 and C7-T1. Straightening of the lumbar spine with multilevel degenerative changes worse at L4-5"    Patient Stated Goals reduce pain, play golf    Currently in Pain? Yes    Pain Location Neck    Pain  Orientation Lower;Mid    Pain Descriptors / Indicators Aching;Sore    Pain Type Acute pain                               [facility] Adult PT Treatment/Exercise - 05/02/21 0001       Exercises   Other Exercises  wall angles x 10, levator stretch x 3 holding 10 seconds, cervical retraction x 10 holding 5 seconds, rows x 15 green theraband      Modalities   Modalities Traction      Traction   Type of Traction Cervical    Min (lbs) 17    Max (lbs) 24    Time 20 minutes                     PT Education - 05/02/21 1448     Education Details Discussed normal responses to DN    Person(s) Educated Patient    Methods Explanation    Comprehension Verbalized  understanding              PT Short Term Goals - 05/02/21 1452       PT SHORT TERM GOAL #1   Title Pt will be I and compliant with HEP.    Time 4    Status On-going               PT Long Term Goals - 04/11/21 1534       PT LONG TERM GOAL #1   Title Pt will improve neck ROM to WNL    Time 6    Period Weeks    Status New      PT LONG TERM GOAL #2   Title Pt will improve lumbar ROM to WNL.    Time 6    Period Weeks    Status New      PT LONG TERM GOAL #3   Title Pt will reduce overall pain to less than 3/10 with usual activity and report less overall tighntess in his back and neck    Status New                   Plan - 05/02/21 1449     Clinical Impression Statement Pt with good response to mechanical traction in past treatments and also reported less pain following session. DN withheld today due to increased pain with needling following last session. Pt stated he may try it again another visit. Pt tolerating stretches and exercises welll. Continue skilled PT progressing toward pt's LTG's set.    Personal Factors and Comorbidities Comorbidity 3+    Comorbidities PMH:Rt BKA,CKD,DM,seizures,CAD,PVD,MI    Examination-Activity Limitations Lift;Stand;Stairs;Squat;Sleep;Bend;Carry    Examination-Participation Restrictions Community Activity;Driving;Occupation    Stability/Clinical Decision Making Evolving/Moderate complexity    Rehab Potential Good    PT Frequency 1x / week    PT Duration 6 weeks    PT Treatment/Interventions Cryotherapy;Electrical Stimulation;Iontophoresis 68m/ml Dexamethasone;Moist Heat;Traction;Ultrasound;Therapeutic activities;Therapeutic exercise;Neuromuscular re-education;Manual techniques;Passive range of motion;Dry needling;Joint Manipulations;Spinal Manipulations;Taping    PT Next Visit Plan assess how traction is going with increased pull, neck and lumbar mobility gains    PT Home Exercise Plan Access Code: [email or web address]   Consulted and  Agree with Plan of Care Patient             Patient will benefit from skilled therapeutic intervention in order to improve the following deficits and impairments:  Decreased activity tolerance, Decreased  mobility, Decreased range of motion, Decreased strength, Difficulty walking, Postural dysfunction, Impaired flexibility, Pain, Increased muscle spasms  Visit Diagnosis: Cervicalgia  Acute bilateral low back pain without sciatica     Problem List Patient Active Problem List   Diagnosis Date Noted   Alkaline phosphatase raised 11/15/2020   Atherosclerosis of coronary artery without angina pectoris 11/15/2020   Benign prostatic hyperplasia with lower urinary tract symptoms 11/15/2020   Hyperglycemia 11/15/2020   Late effects of cerebrovascular disease 11/15/2020   Long term (current) use of insulin ([place]) 11/15/2020   Melena 11/15/2020   Microalbuminuria 11/15/2020   Personal history of other specified conditions 11/15/2020   Right toe amputee ([place]) 11/15/2020   Ulcer of left foot, limited to breakdown of skin ([place]) 11/15/2020   Vitamin D deficiency 11/15/2020   S/P BKA (below knee amputation) unilateral, right ([place]) 10/23/2018   Intermittent pain    Phantom limb pain (HCC)    CKD (chronic kidney disease), stage III ([place])    Labile blood glucose    Other disorders of plasma-protein metabolism, not elsewhere classified 09/22/2018   Acute blood loss anemia    Hypoalbuminemia due to protein-calorie malnutrition (HCC)    Diabetes mellitus type 2 in nonobese [facility])    Postoperative pain    Neuropathic pain    Seizures ([place])    Right below-knee amputee ([place]) 09/19/2018   Below knee amputation ([place]) 09/17/2018   Status post transmetatarsal amputation of foot, right ([place]) 07/23/2018   Osteomyelitis ([place]) 07/23/2018   TIA (transient ischemic attack) 10/30/2017   Medication monitoring encounter 03/28/2017   Status post amputation of toe of right foot ([place]) 03/05/2017   Subacute  osteomyelitis, right ankle and foot ([place])    Peripheral neuropathy 02/16/2017   Peripheral artery disease ([place]) 02/16/2017   Type 2 diabetes mellitus with diabetic foot ulcer ([place]) 02/14/2017   Diabetic foot infection ([place]) 02/14/2017   Type II diabetes mellitus with renal manifestations ([place])    HLD (hyperlipidemia)    Tobacco abuse    CKD stage 2 due to type 2 diabetes mellitus ([place])    CAD (coronary artery disease)    Seizure disorder ([place])     Oretha Caprice, PT, MPT 05/02/2021, 2:53 PM  [facility] [address] [place], [place], [address] Phone: [phone]   Fax:  [phone]  Name: Curtis Bradford MRN: [id number] Date of Birth: 03-22-1961  PHYSICAL THERAPY DISCHARGE SUMMARY  Visits from Start of Care: 4  Current functional level related to goals / functional outcomes: See above   Remaining deficits: See above   Education / Equipment: HEP   Patient agrees to discharge. Patient goals were not met. [name] being discharged due to not returning since the last visit.

## 2021-05-09 ENCOUNTER — Encounter: Payer: Managed Care, Other (non HMO) | Admitting: Physical Therapy

## 2021-05-16 ENCOUNTER — Encounter: Payer: Managed Care, Other (non HMO) | Admitting: Physical Therapy

## 2021-05-26 ENCOUNTER — Ambulatory Visit: Payer: Managed Care, Other (non HMO) | Admitting: Podiatry

## 2021-06-27 ENCOUNTER — Ambulatory Visit (INDEPENDENT_AMBULATORY_CARE_PROVIDER_SITE_OTHER): Payer: Self-pay | Admitting: Podiatry

## 2021-06-27 DIAGNOSIS — Z91199 Patient's noncompliance with other medical treatment and regimen due to unspecified reason: Secondary | ICD-10-CM

## 2021-06-27 NOTE — Progress Notes (Signed)
   Complete physical exam  Patient: Curtis Bradford   DOB: 04/14/1998   61 y.o. Male  MRN: [id number]  Subjective:    No chief complaint on file.   Curtis Bradford is a 61 y.o. male who presents today for a complete physical exam. She reports consuming a {diet types:17450} diet. {types:19826} She generally feels {DESC; WELL/FAIRLY WELL/POORLY:18703}. She reports sleeping {DESC; WELL/FAIRLY WELL/POORLY:18703}. She {does/does not:200015} have additional problems to discuss today.    Most recent fall risk assessment:    12/20/2021   10:42 AM  Fall Risk   Falls in the past year? 0  Number falls in past yr: 0  Injury with Fall? 0  Risk for fall due to : No Fall Risks  Follow up Falls evaluation completed     Most recent depression screenings:    12/20/2021   10:42 AM 11/10/2020   10:46 AM  PHQ 2/9 Scores  PHQ - 2 Score 0 0  PHQ- 9 Score 5     {VISON DENTAL STD PSA (Optional):27386}  {History (Optional):23778}  Patient Care Team: Jessup, Joy, NP as PCP - General (Nurse Practitioner)   Outpatient Medications Prior to Visit  Medication Sig   fluticasone (FLONASE) 50 MCG/ACT nasal spray Place 2 sprays into both nostrils in the morning and at bedtime. After 7 days, reduce to once daily.   norgestimate-ethinyl estradiol (SPRINTEC 28) 0.25-35 MG-MCG tablet Take 1 tablet by mouth daily.   Nystatin POWD Apply liberally to affected area 2 times per day   spironolactone (ALDACTONE) 100 MG tablet Take 1 tablet (100 mg total) by mouth daily.   No facility-administered medications prior to visit.    ROS        Objective:     There were no vitals taken for this visit. {Vitals History (Optional):23777}  Physical Exam   No results found for any visits on 01/25/22. {Show previous labs (optional):23779}    Assessment & Plan:    Routine Health Maintenance and Physical Exam  Immunization History  Administered Date(s) Administered   DTaP 06/28/1999, 08/24/1999,  11/02/1999, 07/18/2000, 02/01/2004   Hepatitis A 11/28/2007, 12/03/2008   Hepatitis B 04/15/1999, 05/23/1999, 11/02/1999   HiB (PRP-OMP) 06/28/1999, 08/24/1999, 11/02/1999, 07/18/2000   IPV 06/28/1999, 08/24/1999, 04/22/2000, 02/01/2004   Influenza,inj,Quad PF,6+ Mos 03/05/2014   Influenza-Unspecified 06/04/2012   MMR 04/22/2001, 02/01/2004   Meningococcal Polysaccharide 12/03/2011   Pneumococcal Conjugate-13 07/18/2000   Pneumococcal-Unspecified 11/02/1999, 01/16/2000   Tdap 12/03/2011   Varicella 04/22/2000, 11/28/2007    Health Maintenance  Topic Date Due   HIV Screening  Never done   Hepatitis C Screening  Never done   INFLUENZA VACCINE  01/23/2022   PAP-Cervical Cytology Screening  01/25/2022 (Originally 04/13/2020)   PAP SMEAR-Modifier  01/25/2022 (Originally 04/13/2020)   TETANUS/TDAP  01/25/2022 (Originally 12/02/2021)   HPV VACCINES  Discontinued   COVID-19 Vaccine  Discontinued    Discussed health benefits of physical activity, and encouraged her to engage in regular exercise appropriate for her age and condition.  Problem List Items Addressed This Visit   None Visit Diagnoses     Annual physical exam    -  Primary   Cervical cancer screening       Need for Tdap vaccination          No follow-ups on file.     Joy Jessup, NP   

## 2022-07-03 ENCOUNTER — Ambulatory Visit (INDEPENDENT_AMBULATORY_CARE_PROVIDER_SITE_OTHER): Payer: 59 | Admitting: Orthopedic Surgery

## 2022-07-03 DIAGNOSIS — L97521 Non-pressure chronic ulcer of other part of left foot limited to breakdown of skin: Secondary | ICD-10-CM | POA: Diagnosis not present

## 2022-07-05 ENCOUNTER — Encounter: Payer: Self-pay | Admitting: Orthopedic Surgery

## 2022-07-05 NOTE — Progress Notes (Signed)
Office Visit Note   Patient: Curtis Bradford           Date of Birth: [birth year]           MRN: [id number] Visit Date: 07/03/2022              Requested by: Sueanne Margarita, [place],  [place] [address] PCP: Sueanne Margarita, DO  Chief Complaint  Patient presents with   Left Foot - Wound Check      HPI: [name] a 62 year old gentleman who presents with 2 separate issues #1 right transtibial amputation #2 ulceration left foot.  Patient states he got out of the shower a week ago and had a puddle of blood on the floor.  Assessment & Plan: Visit Diagnoses:  1. Non-pressure chronic ulcer of other part of left foot limited to breakdown of skin ([place])     Plan: Patient does have good extra-depth shoes with custom orthotic.  Ulcer was debrided he has good pressure offloading.  Follow-Up Instructions: Return in about 4 weeks (around 07/31/2022).   Ortho Exam  [name] alert, oriented, no adenopathy, well-dressed, normal affect, normal respiratory effort. Examination patient has a stable right transtibial amputation.  Examination of the left foot he has a large ulcer beneath the first metatarsal head.  After informed consent a 10 blade knife was used to debride the skin and soft tissue back to healthy viable granulation tissue.  There is no exposed bone or tendon no abscess.  After debridement the ulcer is 3 x 4 cm.  Patient also has callus beneath the fifth metatarsal head.  This was also pared with a 10 blade knife.  Callused area was 2 cm in diameter with good intact skin.  There is no redness cellulitis or signs of infection in his foot.  Imaging: No results found. No images are attached to the encounter.  Labs: Lab Results  Component Value Date   HGBA1C 7.5 (H) 09/17/2018   HGBA1C 8.3 (H) 07/23/2018   HGBA1C 7.4 (H) 04/23/2018   ESRSEDRATE 31 (H) 02/14/2017   CRP 12.3 (H) 02/14/2017   REPTSTATUS 03/13/2019 FINAL 03/11/2019   CULT  03/11/2019    NO  GROWTH Performed at [facility], [place] [address], [place], [place] [address]      Lab Results  Component Value Date   ALBUMIN 3.9 10/22/2018   ALBUMIN 3.0 (L) 09/22/2018   ALBUMIN 4.1 01/31/2018   PREALBUMIN 13.8 (L) 02/15/2017    No results found for: "MG" No results found for: "VD25OH"  Lab Results  Component Value Date   PREALBUMIN 13.8 (L) 02/15/2017      Latest Ref Rng & Units 10/22/2018    4:13 PM 09/26/2018    5:13 AM 09/22/2018    5:50 AM  CBC EXTENDED  WBC 4.0 - 10.5 K/uL 21.7  10.1  9.9   RBC 4.22 - 5.81 MIL/uL 4.86  3.52  3.46   Hemoglobin 13.0 - 17.0 g/dL 14.2  10.2  10.0   HCT 39.0 - 52.0 % 44.1  32.0  31.5   Platelets 150 - 400 K/uL 361  460  375   NEUT# 1.7 - 7.7 K/uL 16.9  6.5  6.2   Lymph# 0.7 - 4.0 K/uL 2.6  1.9  1.9      There is no height or weight on file to calculate BMI.  Orders:  No orders of the defined types were placed in this encounter.  No orders of the  defined types were placed in this encounter.    Procedures: No procedures performed  Clinical Data: No additional findings.  ROS:  All other systems negative, except as noted in the HPI. Review of Systems  Objective: Vital Signs: There were no vitals taken for this visit.  Specialty Comments:  No specialty comments available.  PMFS History: Patient Active Problem List   Diagnosis Date Noted   Alkaline phosphatase raised 11/15/2020   Atherosclerosis of coronary artery without angina pectoris 11/15/2020   Benign prostatic hyperplasia with lower urinary tract symptoms 11/15/2020   Hyperglycemia 11/15/2020   Late effects of cerebrovascular disease 11/15/2020   Long term (current) use of insulin ([place]) 11/15/2020   Melena 11/15/2020   Microalbuminuria 11/15/2020   Personal history of other specified conditions 11/15/2020   Right toe amputee ([place]) 11/15/2020   Ulcer of left foot, limited to breakdown of skin ([place]) 11/15/2020   Vitamin D deficiency 11/15/2020   S/P BKA  (below knee amputation) unilateral, right ([place]) 10/23/2018   Intermittent pain    Phantom limb pain (HCC)    CKD (chronic kidney disease), stage III ([place])    Labile blood glucose    Other disorders of plasma-protein metabolism, not elsewhere classified 09/22/2018   Acute blood loss anemia    Hypoalbuminemia due to protein-calorie malnutrition (HCC)    Diabetes mellitus type 2 in nonobese [facility])    Postoperative pain    Neuropathic pain    Seizures ([place])    Right below-knee amputee ([place]) 09/19/2018   Below knee amputation ([place]) 09/17/2018   Status post transmetatarsal amputation of foot, right ([place]) 07/23/2018   Osteomyelitis ([place]) 07/23/2018   TIA (transient ischemic attack) 10/30/2017   Medication monitoring encounter 03/28/2017   Status post amputation of toe of right foot ([place]) 03/05/2017   Subacute osteomyelitis, right ankle and foot ([place])    Peripheral neuropathy 02/16/2017   Peripheral artery disease ([place]) 02/16/2017   Type 2 diabetes mellitus with diabetic foot ulcer ([place]) 02/14/2017   Diabetic foot infection ([place]) 02/14/2017   Type II diabetes mellitus with renal manifestations ([place])    HLD (hyperlipidemia)    Tobacco abuse    CKD stage 2 due to type 2 diabetes mellitus ([place])    CAD (coronary artery disease)    Seizure disorder ([place])    Past Medical History:  Diagnosis Date   CAD (coronary artery disease)    CKD (chronic kidney disease), stage III ([place])    Diabetes mellitus without complication ([place])    Type II   History of DVT (deep vein thrombosis)    History of kidney stones    passed stones - no surgery   HLD (hyperlipidemia)    Myocardial infarction ([place]) 2009   Peripheral vascular disease ([place])    right leg   Seizure ([place])    last one 2015   Stroke ([employer]) 2012, 10/2017   2012-speech was effected- has come back.10/2017- numbness of right side no taste right  side. 2012-   Tobacco abuse     Family History  Problem Relation Age of Onset   Diabetes Mellitus  II Mother    Diabetes Mellitus II Father    Colon cancer Neg Hx    Esophageal cancer Neg Hx     Past Surgical History:  Procedure Laterality Date   AMPUTATION Right 02/23/2017   Procedure: RIGHT FOOT 5TH RAY AMPUTATION;  Surgeon: Newt Minion, MD;  Location: [place];  Service: Orthopedics;  Laterality: Right;   AMPUTATION Right 04/23/2018  Procedure: Right Great Toe Amputation;  Surgeon: [name], MD;  Location[facility] OR;  Service: Orthopedics;  Laterality: Right;   AMPUTATION Right 07/23/2018   Procedure: RIGHT TRANSMETATARSAL AMPUTATION, APPLY WOUND VAC;  Surgeon: [name], MD;  Location: [facility] OR;  Service: Orthopedics;  Laterality: Right;   AMPUTATION Right 09/17/2018   Procedure: RIGHT AMPUTATION BELOW KNEE;  Surgeon: [name], MD;  Location: [facility] OR;  Service: Orthopedics;  Laterality: Right;   APPLICATION OF WOUND VAC Right 09/17/2018   Procedure: Application Of Wound Vac;  Surgeon: [name], MD;  Location: [facility] OR;  Service: Orthopedics;  Laterality: Right;   BELOW KNEE LEG AMPUTATION Right 09/17/2018   CORONARY ANGIOPLASTY  03/05/2008   stent   FOOT SURGERY     PERIPHERAL ARTERIAL STENT GRAFT Right 2014   leg 2 stents- clotted per pat   STENT PLACEMENT VASCULAR ([facility] HX)     Several in right leg   TONSILLECTOMY     TRANSMETATARSAL AMPUTATION Right 07/23/2018   Social History   Occupational History   Not on file  Tobacco Use   Smoking status: Light Smoker    Packs/day: 0.25    Years: 15.00    Total pack years: 3.75    Types: Cigarettes   Smokeless tobacco: Never   Tobacco comments:    2 or 3 a day   Vaping Use   Vaping Use: Never used  Substance and Sexual Activity   Alcohol use: Not Currently   Drug use: No   Sexual activity: Not on file

## 2022-07-23 ENCOUNTER — Inpatient Hospital Stay (HOSPITAL_BASED_OUTPATIENT_CLINIC_OR_DEPARTMENT_OTHER)
Admission: EM | Admit: 2022-07-23 | Discharge: 2022-07-25 | DRG: 065 | Disposition: A | Discharge status: Home / Self Care | Payer: 59 | Attending: Internal Medicine | Admitting: Internal Medicine

## 2022-07-23 ENCOUNTER — Encounter (HOSPITAL_BASED_OUTPATIENT_CLINIC_OR_DEPARTMENT_OTHER): Payer: Self-pay

## 2022-07-23 ENCOUNTER — Emergency Department (HOSPITAL_BASED_OUTPATIENT_CLINIC_OR_DEPARTMENT_OTHER): Payer: 59

## 2022-07-23 ENCOUNTER — Other Ambulatory Visit: Payer: Self-pay

## 2022-07-23 DIAGNOSIS — R531 Weakness: Secondary | ICD-10-CM

## 2022-07-23 DIAGNOSIS — E8721 Acute metabolic acidosis: Secondary | ICD-10-CM | POA: Diagnosis present

## 2022-07-23 DIAGNOSIS — F17210 Nicotine dependence, cigarettes, uncomplicated: Secondary | ICD-10-CM | POA: Diagnosis not present

## 2022-07-23 DIAGNOSIS — N179 Acute kidney failure, unspecified: Secondary | ICD-10-CM | POA: Diagnosis present

## 2022-07-23 DIAGNOSIS — Z833 Family history of diabetes mellitus: Secondary | ICD-10-CM

## 2022-07-23 DIAGNOSIS — Z7984 Long term (current) use of oral hypoglycemic drugs: Secondary | ICD-10-CM | POA: Diagnosis not present

## 2022-07-23 DIAGNOSIS — E785 Hyperlipidemia, unspecified: Secondary | ICD-10-CM | POA: Diagnosis not present

## 2022-07-23 DIAGNOSIS — I252 Old myocardial infarction: Secondary | ICD-10-CM | POA: Diagnosis not present

## 2022-07-23 DIAGNOSIS — E11621 Type 2 diabetes mellitus with foot ulcer: Secondary | ICD-10-CM

## 2022-07-23 DIAGNOSIS — H532 Diplopia: Secondary | ICD-10-CM | POA: Diagnosis not present

## 2022-07-23 DIAGNOSIS — Z89511 Acquired absence of right leg below knee: Secondary | ICD-10-CM | POA: Diagnosis not present

## 2022-07-23 DIAGNOSIS — Z7902 Long term (current) use of antithrombotics/antiplatelets: Secondary | ICD-10-CM

## 2022-07-23 DIAGNOSIS — E1122 Type 2 diabetes mellitus with diabetic chronic kidney disease: Secondary | ICD-10-CM | POA: Diagnosis not present

## 2022-07-23 DIAGNOSIS — Z955 Presence of coronary angioplasty implant and graft: Secondary | ICD-10-CM | POA: Diagnosis not present

## 2022-07-23 DIAGNOSIS — R29702 NIHSS score 2: Secondary | ICD-10-CM | POA: Diagnosis present

## 2022-07-23 DIAGNOSIS — Z86718 Personal history of other venous thrombosis and embolism: Secondary | ICD-10-CM

## 2022-07-23 DIAGNOSIS — I129 Hypertensive chronic kidney disease with stage 1 through stage 4 chronic kidney disease, or unspecified chronic kidney disease: Secondary | ICD-10-CM | POA: Diagnosis not present

## 2022-07-23 DIAGNOSIS — E871 Hypo-osmolality and hyponatremia: Secondary | ICD-10-CM | POA: Diagnosis not present

## 2022-07-23 DIAGNOSIS — Z794 Long term (current) use of insulin: Secondary | ICD-10-CM | POA: Diagnosis not present

## 2022-07-23 DIAGNOSIS — G8191 Hemiplegia, unspecified affecting right dominant side: Secondary | ICD-10-CM | POA: Diagnosis present

## 2022-07-23 DIAGNOSIS — R200 Anesthesia of skin: Secondary | ICD-10-CM

## 2022-07-23 DIAGNOSIS — H53461 Homonymous bilateral field defects, right side: Secondary | ICD-10-CM | POA: Diagnosis present

## 2022-07-23 DIAGNOSIS — Z7982 Long term (current) use of aspirin: Secondary | ICD-10-CM

## 2022-07-23 DIAGNOSIS — Z79899 Other long term (current) drug therapy: Secondary | ICD-10-CM

## 2022-07-23 DIAGNOSIS — N1831 Chronic kidney disease, stage 3a: Secondary | ICD-10-CM | POA: Diagnosis not present

## 2022-07-23 DIAGNOSIS — R42 Dizziness and giddiness: Secondary | ICD-10-CM

## 2022-07-23 DIAGNOSIS — E1151 Type 2 diabetes mellitus with diabetic peripheral angiopathy without gangrene: Secondary | ICD-10-CM | POA: Diagnosis not present

## 2022-07-23 DIAGNOSIS — I6381 Other cerebral infarction due to occlusion or stenosis of small artery: Secondary | ICD-10-CM | POA: Diagnosis not present

## 2022-07-23 DIAGNOSIS — I639 Cerebral infarction, unspecified: Secondary | ICD-10-CM | POA: Diagnosis present

## 2022-07-23 DIAGNOSIS — E1129 Type 2 diabetes mellitus with other diabetic kidney complication: Secondary | ICD-10-CM | POA: Diagnosis present

## 2022-07-23 DIAGNOSIS — I2510 Atherosclerotic heart disease of native coronary artery without angina pectoris: Secondary | ICD-10-CM | POA: Diagnosis present

## 2022-07-23 DIAGNOSIS — D72829 Elevated white blood cell count, unspecified: Secondary | ICD-10-CM | POA: Diagnosis present

## 2022-07-23 DIAGNOSIS — R519 Headache, unspecified: Secondary | ICD-10-CM

## 2022-07-23 LAB — CBC WITH DIFFERENTIAL/PLATELET
Abs Immature Granulocytes: 0.08 10*3/uL — ABNORMAL HIGH (ref 0.00–0.07)
Basophils Absolute: 0.1 10*3/uL (ref 0.0–0.1)
Basophils Relative: 1 %
Eosinophils Absolute: 0.4 10*3/uL (ref 0.0–0.5)
Eosinophils Relative: 3 %
HCT: 49.2 % (ref 39.0–52.0)
Hemoglobin: 16.7 g/dL (ref 13.0–17.0)
Immature Granulocytes: 1 %
Lymphocytes Relative: 24 %
Lymphs Abs: 2.7 10*3/uL (ref 0.7–4.0)
MCH: 30.6 pg (ref 26.0–34.0)
MCHC: 33.9 g/dL (ref 30.0–36.0)
MCV: 90.1 fL (ref 80.0–100.0)
Monocytes Absolute: 0.9 10*3/uL (ref 0.1–1.0)
Monocytes Relative: 8 %
Neutro Abs: 7.2 10*3/uL (ref 1.7–7.7)
Neutrophils Relative %: 63 %
Platelets: 301 10*3/uL (ref 150–400)
RBC: 5.46 MIL/uL (ref 4.22–5.81)
RDW: 14.3 % (ref 11.5–15.5)
WBC: 11.4 10*3/uL — ABNORMAL HIGH (ref 4.0–10.5)
nRBC: 0 % (ref 0.0–0.2)

## 2022-07-23 LAB — COMPREHENSIVE METABOLIC PANEL
ALT: 19 U/L (ref 0–44)
AST: 15 U/L (ref 15–41)
Albumin: 4.3 g/dL (ref 3.5–5.0)
Alkaline Phosphatase: 84 U/L (ref 38–126)
Anion gap: 9 (ref 5–15)
BUN: 19 mg/dL (ref 6–20)
CO2: 22 mmol/L (ref 22–32)
Calcium: 8.7 mg/dL — ABNORMAL LOW (ref 8.9–10.3)
Chloride: 102 mmol/L (ref 98–111)
Creatinine, Ser: 1.5 mg/dL — ABNORMAL HIGH (ref 0.61–1.24)
GFR, Estimated: 53 mL/min — ABNORMAL LOW (ref >60)
Glucose, Bld: 147 mg/dL — ABNORMAL HIGH (ref 70–99)
Potassium: 4.3 mmol/L (ref 3.5–5.1)
Sodium: 133 mmol/L — ABNORMAL LOW (ref 135–145)
Total Bilirubin: 0.5 mg/dL (ref 0.3–1.2)
Total Protein: 7.8 g/dL (ref 6.5–8.1)

## 2022-07-23 LAB — CBG MONITORING, ED: Glucose-Capillary: 138 mg/dL — ABNORMAL HIGH (ref 70–99)

## 2022-07-23 MED ORDER — IOHEXOL 350 MG/ML SOLN
75.0000 mL | Freq: Once | INTRAVENOUS | Status: AC | PRN
  Administered 2022-07-23: 75 mL via INTRAVENOUS

## 2022-07-23 NOTE — ED Notes (Signed)
Report given to [place] @[facility]

## 2022-07-23 NOTE — ED Triage Notes (Signed)
Pt c/o numbness on "whole R side of body," onset 2pm. States he feels touch & sensation, "but my whole body is just numb/ asleep, like I fell asleep on my arm." Pt w decreased sensation to R side. Endorses "a little bit" of blurred vision x1wk, "some HA behind R ear."  EDP at bedside to assess

## 2022-07-23 NOTE — ED Notes (Signed)
Report given to [facility]. 

## 2022-07-23 NOTE — ED Notes (Signed)
Called [facility] at 11:15pm for transport to ED at [facility] for MRI

## 2022-07-23 NOTE — ED Provider Notes (Signed)
[facility] Provider Note   CSN: [id number] Arrival date & time: 07/23/22  2039     History  Chief Complaint  Patient presents with   Numbness    R side    Curtis Bradford is a 62 y.o. male.  The history is provided by the patient and medical records. No language interpreter was used.  Neurologic Problem This is a new problem. The current episode started 6 to 12 hours ago. The problem occurs constantly. The problem has not changed since onset.Associated symptoms include headaches. Pertinent negatives include no chest pain, no abdominal pain and no shortness of breath. Nothing aggravates the symptoms. Nothing relieves the symptoms. He has tried nothing for the symptoms. The treatment provided no relief.       Home Medications Prior to Admission medications   Medication Sig Start Date End Date Taking? Authorizing Provider  acetaminophen (TYLENOL) 325 MG tablet Take 1-2 tablets (325-650 mg total) by mouth every 6 (six) hours as needed for mild pain (pain score 1-3 or temp > 100.5). 10/02/18   Angiulli, Lavon Paganini, PA-C  aspirin EC 81 MG tablet Take 81 mg by mouth daily.     [provider]  atorvastatin (LIPITOR) 20 MG tablet TAKE 1 TABLET BY MOUTH EVERY DAY 03/31/20   Frann Rider, NP  Cholecalciferol (VITAMIN D3) 125 MCG (5000 UT) CAPS 1 capsule 10/26/20   [provider]  clopidogrel (PLAVIX) 75 MG tablet Take 1 tablet (75 mg total) by mouth at bedtime. 12/15/19   Frann Rider, NP  Continuous Blood Gluc Sensor (FREESTYLE LIBRE 14 DAY SENSOR) MISC Place 1 patch onto the skin every 14 (fourteen) days. 06/26/18   Elayne Snare, MD  Continuous Blood Gluc Transmit (DEXCOM G6 TRANSMITTER) MISC See admin instructions. 08/11/20   [provider]  ezetimibe (ZETIA) 10 MG tablet TAKE 1 TABLET BY MOUTH EVERY DAY 03/31/20   Frann Rider, NP  HUMALOG KWIKPEN 100 UNIT/ML KwikPen Inject into the skin. 07/21/20   [provider]   Insulin Glargine (LANTUS) 100 UNIT/ML Solostar Pen Inject 27 Units into the skin at bedtime. Patient taking differently: Inject 37 Units into the skin at bedtime. 10/02/18   Angiulli, Lavon Paganini, PA-C  linagliptin (TRADJENTA) 5 MG TABS tablet Take 1 tablet (5 mg total) by mouth daily. 10/02/18   Angiulli, Lavon Paganini, PA-C  methocarbamol (ROBAXIN) 500 MG tablet Take 1 tablet (500 mg total) by mouth 2 (two) times daily as needed. 03/28/21   Aundra Dubin, PA-C  predniSONE (STERAPRED UNI-PAK 21 TAB) 5 MG (21) TBPK tablet Take as directed 03/28/21   Aundra Dubin, PA-C  tamsulosin (FLOMAX) 0.4 MG CAPS capsule Take 0.4 mg by mouth daily. 07/21/20   [provider]  vancomycin (VANCOCIN) 125 MG capsule Take 1 capsule (125 mg total) by mouth as directed. Take 1 capsule by mouth four times daily for 2 weeks.  Take 1 capsule by mouth twice daily for 1 week.  Take 1 capsule by mouth once daily for 1 week. Then take 1 capsule by mouth every other day for 6 weeks. Then Stop. 11/24/18   Cirigliano, Vito V, DO      Allergies    Metformin    Review of Systems   Review of Systems  Constitutional:  Negative for chills, fatigue and fever.  HENT:  Negative for congestion.   Eyes:  Positive for visual disturbance. Negative for pain.  Respiratory:  Negative for cough, chest tightness, shortness  of breath and wheezing.   Cardiovascular:  Negative for chest pain, palpitations and leg swelling.  Gastrointestinal:  Negative for abdominal pain, constipation, diarrhea, nausea and vomiting.  Genitourinary:  Negative for dysuria.  Musculoskeletal:  Negative for back pain, neck pain and neck stiffness.  Skin:  Negative for rash and wound.  Neurological:  Positive for dizziness, weakness, numbness and headaches. Negative for speech difficulty and light-headedness.  Psychiatric/Behavioral:  Negative for agitation and confusion.   All other systems reviewed and are negative.   Physical Exam Updated Vital  Signs BP 135/85   Pulse 80   Temp 98.4 F (36.9 C) (Oral)   Resp 15   SpO2 95%  Physical Exam Vitals and nursing note reviewed.  Constitutional:      General: He is not in acute distress.    Appearance: He is well-developed. He is not ill-appearing, toxic-appearing or diaphoretic.  HENT:     Head: Normocephalic and atraumatic.     Nose: No congestion or rhinorrhea.     Mouth/Throat:     Mouth: Mucous membranes are moist.     Pharynx: No oropharyngeal exudate or posterior oropharyngeal erythema.  Eyes:     Extraocular Movements: Extraocular movements intact.     Conjunctiva/sclera: Conjunctivae normal.     Pupils: Pupils are equal, round, and reactive to light.  Neck:     Vascular: No carotid bruit.  Cardiovascular:     Rate and Rhythm: Normal rate and regular rhythm.     Heart sounds: No murmur heard. Pulmonary:     Effort: Pulmonary effort is normal. No respiratory distress.     Breath sounds: Normal breath sounds. No wheezing, rhonchi or rales.  Chest:     Chest wall: No tenderness.  Abdominal:     General: Abdomen is flat.     Palpations: Abdomen is soft.     Tenderness: There is no abdominal tenderness. There is no right CVA tenderness, left CVA tenderness, guarding or rebound.  Musculoskeletal:        General: No swelling or tenderness.     Cervical back: Neck supple. No tenderness.     Right lower leg: No edema.     Left lower leg: No edema.  Skin:    General: Skin is warm and dry.     Capillary Refill: Capillary refill takes less than 2 seconds.     Findings: No erythema or rash.  Neurological:     Mental Status: He is alert.     Sensory: Sensory deficit present.     Motor: Weakness present.  Psychiatric:        Mood and Affect: Mood normal.     ED Results / Procedures / Treatments   Labs (all labs ordered are listed, but only abnormal results are displayed) Labs Reviewed  CBC WITH DIFFERENTIAL/PLATELET - Abnormal; Notable for the following components:       Result Value   WBC 11.4 (*)    Abs Immature Granulocytes 0.08 (*)    All other components within normal limits  COMPREHENSIVE METABOLIC PANEL - Abnormal; Notable for the following components:   Sodium 133 (*)    Glucose, Bld 147 (*)    Creatinine, Ser 1.50 (*)    Calcium 8.7 (*)    GFR, Estimated 53 (*)    All other components within normal limits  CBG MONITORING, ED - Abnormal; Notable for the following components:   Glucose-Capillary 138 (*)    All other components within normal limits  TSH  URINALYSIS, ROUTINE W REFLEX MICROSCOPIC    EKG None  Radiology CT ANGIO HEAD NECK W WO CM  Addendum Date: 07/23/2022   ADDENDUM REPORT: 07/23/2022 22:51 ADDENDUM: There is mild atherosclerotic disease at both carotid bifurcations without hemodynamically significant stenosis. Electronically Signed   By: Deatra Robinson M.D.   On: 07/23/2022 22:51   Result Date: 07/23/2022 CLINICAL DATA:  Right face, arm and leg numbness EXAM: CT ANGIOGRAPHY HEAD AND NECK TECHNIQUE: Multidetector CT imaging of the head and neck was performed using the standard protocol during bolus administration of intravenous contrast. Multiplanar CT image reconstructions and MIPs were obtained to evaluate the vascular anatomy. Carotid stenosis measurements (when applicable) are obtained utilizing NASCET criteria, using the distal internal carotid diameter as the denominator. RADIATION DOSE REDUCTION: This exam was performed according to the departmental dose-optimization program which includes automated exposure control, adjustment of the mA and/or kV according to patient size and/or use of iterative reconstruction technique. CONTRAST:  40mL OMNIPAQUE IOHEXOL 350 MG/ML SOLN COMPARISON:  10/31/2017 FINDINGS: CT HEAD FINDINGS Brain: There is no mass, hemorrhage or extra-axial collection. There is generalized atrophy without lobar predilection. Old left parietotemporal infarct. There is hypoattenuation of the periventricular white  matter, most commonly indicating chronic ischemic microangiopathy. Skull: The visualized skull base, calvarium and extracranial soft tissues are normal. Sinuses/Orbits: No fluid levels or advanced mucosal thickening of the visualized paranasal sinuses. No mastoid or middle ear effusion. The orbits are normal. CTA NECK FINDINGS SKELETON: There is no bony spinal canal stenosis. No lytic or blastic lesion. OTHER NECK: Normal pharynx, larynx and major salivary glands. No cervical lymphadenopathy. Unremarkable thyroid gland. UPPER CHEST: No pneumothorax or pleural effusion. No nodules or masses. AORTIC ARCH: There is calcific atherosclerosis of the aortic arch. There is no aneurysm, dissection or hemodynamically significant stenosis of the visualized portion of the aorta. Conventional 3 vessel aortic branching pattern. The visualized proximal subclavian arteries are widely patent. RIGHT CAROTID SYSTEM: Normal without aneurysm, dissection or stenosis. LEFT CAROTID SYSTEM: Normal without aneurysm, dissection or stenosis. VERTEBRAL ARTERIES: Left dominant configuration. Both origins are clearly patent. There is no dissection, occlusion or flow-limiting stenosis to the skull base (V1-V3 segments). CTA HEAD FINDINGS POSTERIOR CIRCULATION: --Vertebral arteries: Normal V4 segments. --Inferior cerebellar arteries: Normal. --Basilar artery: Normal. --Superior cerebellar arteries: Normal. --Posterior cerebral arteries (PCA): Normal. ANTERIOR CIRCULATION: --Intracranial internal carotid arteries: Atherosclerotic calcification of the internal carotid arteries at the skull base without hemodynamically significant stenosis. --Anterior cerebral arteries (ACA): Normal. Both A1 segments are present. Patent anterior communicating artery (a-comm). --Middle cerebral arteries (MCA): Normal. VENOUS SINUSES: As permitted by contrast timing, patent. ANATOMIC VARIANTS: None Review of the MIP images confirms the above findings. IMPRESSION: 1. No  emergent large vessel occlusion or hemodynamically significant stenosis of the head or neck. 2. 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 Aortic atherosclerosis (ICD10-I70.0). Electronically Signed: By: Deatra Robinson M.D. On: 07/23/2022 22:40    Procedures Procedures    Medications Ordered in ED Medications  iohexol (OMNIPAQUE) 350 MG/ML injection 75 mL (75 mLs Intravenous Contrast Given 07/23/22 2207)     ED Course/ Medical Decision Making/ A&P                             Medical Decision Making Amount and/or Complexity of Data Reviewed Labs: ordered. Radiology: ordered.  Risk Prescription drug management.    Shashank Kwasnik is a 62 y.o. male with a past medical history significant for  hyperlipidemia, diabetes, CKD, CAD, previous right BKA, previous stroke, previous seizure, DVT, kidney stones, peripheral vascular disease, and previous TIA who presents with neurologic complaints.  According to patient, he was at his baseline at around 2 PM today (6 hours 45 min ago) and then noticed he was having numbness on his right body including his right face, right arm, right leg.  He felt his right arm and right leg were weak compared to the left.  He reports some of the symptoms were present with his previous throat but he had no symptoms recently.  He said that he had not had the weakness symptoms.  He reports for the last week or so his left eyes vision has been slightly blurry.  He denies any eye pain.  He is reports having occipital headache going into his right neck area.  He denies any trauma.  Denies fevers, chills, congestion, cough.  Denies nausea, vomiting, constipation, diarrhea, or urinary changes.  I was asked to come evaluate patient in the triage room and did so.  Patient does indeed have weakness in his right arm, right leg, and right face.  He has the right BKA but has decreased strength on leg raise at the hip.  He has symmetric smile and pupils are  symmetric and reactive with normal extract movements.  He had symmetric finger-nose-finger evaluation.  Speech was clear.  I did not appreciate carotid bruit initially.  Occipital area was tender to palpation but there was no skin changes or rash seen.  Given the patient's lack of other symptoms and these isolated neurologic symptoms with pain I am concerned he may be having a stroke again.  Due to the posterior circulation possibility with the dizziness and pain, will get CTA head and neck.  Will get screening labs and reevaluate.  9:18 PM Spoke to Dr. Iver Nestle and she agrees with this plan.  She agrees with getting CTA head and neck now and if it shows stroke, admit for further management.  She agrees that it does not sound like an LVO stroke at this time and agrees with holding on code stroke activation.  Anticipate patient we will either need transfer for MRI versus admission based on the imaging findings.  11:11 PM CT scan did not show evidence of acute stroke but showed the old strokes.  There is no bleeding.  I spoke to neurology again who does want him to get MRI given the persistent symptoms he is still having.  MRI without contrast was ordered.  We also ordered urinalysis to look for other metabolic cause of symptoms.  Neurology recommends getting the MRI and if it is reassuring, reassess to determine if he needs admission or not.  If it does show acute stroke, they recommended admission.  Spoke to Dr. Theron Arista medicine who accept the patient for ED to ED transfer to get the MRI and reassessed.        Final Clinical Impression(s) / ED Diagnoses Final diagnoses:  Right sided numbness  Right sided weakness  Dizziness  Nonintractable headache, unspecified chronicity pattern, unspecified headache type    Clinical Impression: 1. Right sided numbness   2. Right sided weakness   3. Dizziness   4. Nonintractable headache, unspecified chronicity pattern, unspecified headache type      Disposition: ED to ED transfer for MRI to rule out acute stroke.  Disposition will be determined after imaging is completed and patient is reassessed.  This note was prepared with assistance of Dragon voice recognition  software. Occasional wrong-word or sound-a-like substitutions may have occurred due to the inherent limitations of voice recognition software.     Mansoor Hillyard, Canary Brim, MD 07/23/22 2322

## 2022-07-24 ENCOUNTER — Emergency Department (HOSPITAL_COMMUNITY): Payer: 59

## 2022-07-24 ENCOUNTER — Encounter (HOSPITAL_COMMUNITY): Payer: Self-pay | Admitting: Emergency Medicine

## 2022-07-24 ENCOUNTER — Other Ambulatory Visit: Payer: Self-pay

## 2022-07-24 ENCOUNTER — Observation Stay (HOSPITAL_BASED_OUTPATIENT_CLINIC_OR_DEPARTMENT_OTHER): Payer: 59

## 2022-07-24 DIAGNOSIS — E8721 Acute metabolic acidosis: Secondary | ICD-10-CM | POA: Diagnosis present

## 2022-07-24 DIAGNOSIS — D72829 Elevated white blood cell count, unspecified: Secondary | ICD-10-CM | POA: Diagnosis present

## 2022-07-24 DIAGNOSIS — Z955 Presence of coronary angioplasty implant and graft: Secondary | ICD-10-CM | POA: Diagnosis not present

## 2022-07-24 DIAGNOSIS — E1122 Type 2 diabetes mellitus with diabetic chronic kidney disease: Secondary | ICD-10-CM | POA: Diagnosis present

## 2022-07-24 DIAGNOSIS — I639 Cerebral infarction, unspecified: Secondary | ICD-10-CM | POA: Diagnosis present

## 2022-07-24 DIAGNOSIS — G8191 Hemiplegia, unspecified affecting right dominant side: Secondary | ICD-10-CM | POA: Diagnosis present

## 2022-07-24 DIAGNOSIS — H53461 Homonymous bilateral field defects, right side: Secondary | ICD-10-CM | POA: Diagnosis present

## 2022-07-24 DIAGNOSIS — E1151 Type 2 diabetes mellitus with diabetic peripheral angiopathy without gangrene: Secondary | ICD-10-CM | POA: Diagnosis present

## 2022-07-24 DIAGNOSIS — Z86718 Personal history of other venous thrombosis and embolism: Secondary | ICD-10-CM | POA: Diagnosis not present

## 2022-07-24 DIAGNOSIS — R42 Dizziness and giddiness: Secondary | ICD-10-CM | POA: Diagnosis present

## 2022-07-24 DIAGNOSIS — N179 Acute kidney failure, unspecified: Secondary | ICD-10-CM | POA: Diagnosis present

## 2022-07-24 DIAGNOSIS — Z7984 Long term (current) use of oral hypoglycemic drugs: Secondary | ICD-10-CM | POA: Diagnosis not present

## 2022-07-24 DIAGNOSIS — Z794 Long term (current) use of insulin: Secondary | ICD-10-CM

## 2022-07-24 DIAGNOSIS — E871 Hypo-osmolality and hyponatremia: Secondary | ICD-10-CM | POA: Diagnosis present

## 2022-07-24 DIAGNOSIS — I2510 Atherosclerotic heart disease of native coronary artery without angina pectoris: Secondary | ICD-10-CM

## 2022-07-24 DIAGNOSIS — R29702 NIHSS score 2: Secondary | ICD-10-CM | POA: Diagnosis present

## 2022-07-24 DIAGNOSIS — N1831 Chronic kidney disease, stage 3a: Secondary | ICD-10-CM

## 2022-07-24 DIAGNOSIS — Z89511 Acquired absence of right leg below knee: Secondary | ICD-10-CM | POA: Diagnosis not present

## 2022-07-24 DIAGNOSIS — Z833 Family history of diabetes mellitus: Secondary | ICD-10-CM | POA: Diagnosis not present

## 2022-07-24 DIAGNOSIS — H532 Diplopia: Secondary | ICD-10-CM | POA: Diagnosis present

## 2022-07-24 DIAGNOSIS — I6389 Other cerebral infarction: Secondary | ICD-10-CM

## 2022-07-24 DIAGNOSIS — I6381 Other cerebral infarction due to occlusion or stenosis of small artery: Secondary | ICD-10-CM | POA: Diagnosis present

## 2022-07-24 DIAGNOSIS — Z79899 Other long term (current) drug therapy: Secondary | ICD-10-CM | POA: Diagnosis not present

## 2022-07-24 DIAGNOSIS — F17210 Nicotine dependence, cigarettes, uncomplicated: Secondary | ICD-10-CM | POA: Diagnosis present

## 2022-07-24 DIAGNOSIS — E785 Hyperlipidemia, unspecified: Secondary | ICD-10-CM | POA: Diagnosis present

## 2022-07-24 DIAGNOSIS — I129 Hypertensive chronic kidney disease with stage 1 through stage 4 chronic kidney disease, or unspecified chronic kidney disease: Secondary | ICD-10-CM | POA: Diagnosis present

## 2022-07-24 DIAGNOSIS — I252 Old myocardial infarction: Secondary | ICD-10-CM | POA: Diagnosis not present

## 2022-07-24 LAB — LIPID PANEL
Cholesterol: 89 mg/dL (ref 0–200)
HDL: 26 mg/dL — ABNORMAL LOW (ref >40)
LDL Cholesterol: 21 mg/dL (ref 0–99)
Total CHOL/HDL Ratio: 3.4 RATIO
Triglycerides: 212 mg/dL — ABNORMAL HIGH (ref <150)
VLDL: 42 mg/dL — ABNORMAL HIGH (ref 0–40)

## 2022-07-24 LAB — CBC
HCT: 44.2 % (ref 39.0–52.0)
Hemoglobin: 15.1 g/dL (ref 13.0–17.0)
MCH: 31.4 pg (ref 26.0–34.0)
MCHC: 34.2 g/dL (ref 30.0–36.0)
MCV: 91.9 fL (ref 80.0–100.0)
Platelets: 287 10*3/uL (ref 150–400)
RBC: 4.81 MIL/uL (ref 4.22–5.81)
RDW: 14.1 % (ref 11.5–15.5)
WBC: 12 10*3/uL — ABNORMAL HIGH (ref 4.0–10.5)
nRBC: 0 % (ref 0.0–0.2)

## 2022-07-24 LAB — CBG MONITORING, ED
Glucose-Capillary: 100 mg/dL — ABNORMAL HIGH (ref 70–99)
Glucose-Capillary: 170 mg/dL — ABNORMAL HIGH (ref 70–99)
Glucose-Capillary: 172 mg/dL — ABNORMAL HIGH (ref 70–99)
Glucose-Capillary: 151 mg/dL — ABNORMAL HIGH (ref 70–99)

## 2022-07-24 LAB — ECHOCARDIOGRAM COMPLETE
Calc EF: 33.4 %
Height: 74 in
S' Lateral: 3.2 cm
Single Plane A2C EF: 26.6 %
Single Plane A4C EF: 39.6 %
Weight: 2912 oz

## 2022-07-24 LAB — COMPREHENSIVE METABOLIC PANEL
ALT: 17 U/L (ref 0–44)
AST: 19 U/L (ref 15–41)
Albumin: 3.4 g/dL — ABNORMAL LOW (ref 3.5–5.0)
Alkaline Phosphatase: 66 U/L (ref 38–126)
Anion gap: 11 (ref 5–15)
BUN: 24 mg/dL — ABNORMAL HIGH (ref 6–20)
CO2: 21 mmol/L — ABNORMAL LOW (ref 22–32)
Calcium: 8.6 mg/dL — ABNORMAL LOW (ref 8.9–10.3)
Chloride: 103 mmol/L (ref 98–111)
Creatinine, Ser: 1.88 mg/dL — ABNORMAL HIGH (ref 0.61–1.24)
GFR, Estimated: 40 mL/min — ABNORMAL LOW (ref >60)
Glucose, Bld: 131 mg/dL — ABNORMAL HIGH (ref 70–99)
Potassium: 4 mmol/L (ref 3.5–5.1)
Sodium: 135 mmol/L (ref 135–145)
Total Bilirubin: 0.6 mg/dL (ref 0.3–1.2)
Total Protein: 5.8 g/dL — ABNORMAL LOW (ref 6.5–8.1)

## 2022-07-24 LAB — HEMOGLOBIN A1C
Hgb A1c MFr Bld: 6.9 % — ABNORMAL HIGH (ref 4.8–5.6)
Mean Plasma Glucose: 151.33 mg/dL

## 2022-07-24 LAB — TSH: TSH: 2.032 u[IU]/mL (ref 0.350–4.500)

## 2022-07-24 LAB — HIV ANTIBODY (ROUTINE TESTING W REFLEX): HIV Screen 4th Generation wRfx: NONREACTIVE

## 2022-07-24 LAB — GLUCOSE, CAPILLARY: Glucose-Capillary: 110 mg/dL — ABNORMAL HIGH (ref 70–99)

## 2022-07-24 MED ORDER — ACETAMINOPHEN 325 MG PO TABS: 650.0000 mg | ORAL_TABLET | ORAL | Status: DC | PRN

## 2022-07-24 MED ORDER — ATORVASTATIN CALCIUM 10 MG PO TABS
20.0000 mg | ORAL_TABLET | Freq: Every day | ORAL | Status: DC
  Administered 2022-07-24 – 2022-07-25 (×2): 20 mg via ORAL
  Filled 2022-07-24 (×2): qty 2

## 2022-07-24 MED ORDER — ACETAMINOPHEN 160 MG/5ML PO SOLN: 650.0000 mg | ORAL | Status: DC | PRN

## 2022-07-24 MED ORDER — SENNOSIDES-DOCUSATE SODIUM 8.6-50 MG PO TABS: 1.0000 | ORAL_TABLET | Freq: Every evening | ORAL | Status: DC | PRN

## 2022-07-24 MED ORDER — ONDANSETRON HCL 4 MG/2ML IJ SOLN
4.0000 mg | Freq: Four times a day (QID) | INTRAMUSCULAR | Status: DC | PRN
  Administered 2022-07-24 (×2): 4 mg via INTRAVENOUS
  Filled 2022-07-24 (×2): qty 2

## 2022-07-24 MED ORDER — SODIUM CHLORIDE 0.9 % IV SOLN: INTRAVENOUS | Status: DC

## 2022-07-24 MED ORDER — PERFLUTREN LIPID MICROSPHERE
1.0000 mL | INTRAVENOUS | Status: AC | PRN
  Administered 2022-07-24: 2 mL via INTRAVENOUS

## 2022-07-24 MED ORDER — INSULIN ASPART 100 UNIT/ML IJ SOLN: 0.0000 [IU] | Freq: Three times a day (TID) | INTRAMUSCULAR | Status: DC

## 2022-07-24 MED ORDER — CLOPIDOGREL BISULFATE 75 MG PO TABS: 75.0000 mg | ORAL_TABLET | Freq: Every day | ORAL | Status: DC

## 2022-07-24 MED ORDER — INSULIN GLARGINE-YFGN 100 UNIT/ML ~~LOC~~ SOLN
10.0000 [IU] | Freq: Every day | SUBCUTANEOUS | Status: DC
  Administered 2022-07-24: 10 [IU] via SUBCUTANEOUS
  Filled 2022-07-24 (×2): qty 0.1

## 2022-07-24 MED ORDER — STUDY - LIBREXIA-STROKE - MILVEXIAN 25 MG OR PLACEBO TABLET (PI-SETHI)
1.0000 | ORAL_TABLET | Freq: Two times a day (BID) | ORAL | Status: DC
  Administered 2022-07-24 – 2022-07-25 (×2): 1 via ORAL
  Filled 2022-07-24 (×2): qty 1

## 2022-07-24 MED ORDER — ASPIRIN 81 MG PO TBEC
81.0000 mg | DELAYED_RELEASE_TABLET | Freq: Every day | ORAL | Status: DC
  Administered 2022-07-24 – 2022-07-25 (×2): 81 mg via ORAL
  Filled 2022-07-24 (×2): qty 1

## 2022-07-24 MED ORDER — INSULIN GLARGINE-YFGN 100 UNIT/ML ~~LOC~~ SOLN
12.0000 [IU] | Freq: Every day | SUBCUTANEOUS | Status: DC
  Filled 2022-07-24: qty 0.12

## 2022-07-24 MED ORDER — METOCLOPRAMIDE HCL 5 MG/ML IJ SOLN
10.0000 mg | Freq: Four times a day (QID) | INTRAMUSCULAR | Status: DC | PRN
  Filled 2022-07-24: qty 2

## 2022-07-24 MED ORDER — CLOPIDOGREL BISULFATE 75 MG PO TABS
75.0000 mg | ORAL_TABLET | Freq: Every day | ORAL | Status: DC
  Administered 2022-07-24 – 2022-07-25 (×2): 75 mg via ORAL
  Filled 2022-07-24 (×2): qty 1

## 2022-07-24 MED ORDER — CLOPIDOGREL BISULFATE 300 MG PO TABS: 300.0000 mg | ORAL_TABLET | Freq: Once | ORAL | Status: DC

## 2022-07-24 MED ORDER — TAMSULOSIN HCL 0.4 MG PO CAPS
0.4000 mg | ORAL_CAPSULE | Freq: Every day | ORAL | Status: DC
  Filled 2022-07-24 (×2): qty 1

## 2022-07-24 MED ORDER — ENOXAPARIN SODIUM 40 MG/0.4ML IJ SOSY
40.0000 mg | PREFILLED_SYRINGE | INTRAMUSCULAR | Status: DC
  Filled 2022-07-24 (×2): qty 0.4

## 2022-07-24 MED ORDER — INSULIN ASPART 100 UNIT/ML IJ SOLN: 0.0000 [IU] | Freq: Every day | INTRAMUSCULAR | Status: DC

## 2022-07-24 MED ORDER — STROKE: EARLY STAGES OF RECOVERY BOOK
Freq: Once | Status: AC
  Filled 2022-07-24: qty 1

## 2022-07-24 MED ORDER — ACETAMINOPHEN 650 MG RE SUPP: 650.0000 mg | RECTAL | Status: DC | PRN

## 2022-07-24 MED ORDER — ONDANSETRON HCL 4 MG/2ML IJ SOLN
4.0000 mg | Freq: Once | INTRAMUSCULAR | Status: AC
  Administered 2022-07-24: 4 mg via INTRAVENOUS

## 2022-07-24 NOTE — Consult Note (Addendum)
Neurology Consultation Reason for Consult: Right-sided numbness Requesting Physician: Mitzi Hansen  CC: Right-sided numbness  History is obtained from: Patient and chart review  HPI: Ormond Lazo is a 62 y.o. 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  When he first awoke he was just watching TV and therefore did not notice any symptoms until he got out of bed at approximately 130 where he realized he was weak and numb on the right side and frequently stumbling.  He presented for further evaluation, and notes he has also been having some intermittent blurry vision in his left eye for the past 1 week.  He does note that he has been having some double vision at night which he to attributes to age and for which he is planning to follow-up with ophthalmology  LKW: 3:38AM on 1/28 Thrombolytic given?: No, out of the window IA performed?:  No, exam not consistent with LVO Premorbid modified rankin scale:      0 - No symptoms, no residual deficits from prior strokes    ROS: All other review of systems was negative except as noted in the HPI.   Past Medical History:  Diagnosis Date   CAD (coronary artery disease)    CKD (chronic kidney disease), stage III ([place])    Diabetes mellitus without complication ([place])    Type II   History of DVT (deep vein thrombosis)    History of kidney stones    passed stones - no surgery   HLD (hyperlipidemia)    Myocardial infarction [facility]) 2009   Peripheral vascular disease ([place])    right leg   Seizure ([place])    last one 2015   Stroke ([place]) 2012, 10/2017    2012-speech was effected- has come back.10/2017- numbness of right side no taste right  side. 2012-   Tobacco abuse    Past Surgical History:  Procedure Laterality Date   AMPUTATION Right 02/23/2017   Procedure: RIGHT FOOT 5TH RAY AMPUTATION;  Surgeon: Newt Minion, MD;  Location: [place];  Service: Orthopedics;  Laterality: Right;   AMPUTATION Right 04/23/2018   Procedure: Right Great Toe Amputation;  Surgeon: Newt Minion, MD;  Location: [place];  Service: Orthopedics;  Laterality: Right;   AMPUTATION Right 07/23/2018   Procedure: RIGHT TRANSMETATARSAL AMPUTATION, APPLY WOUND VAC;  Surgeon: Newt Minion, MD;  Location: [place];  Service: Orthopedics;  Laterality: Right;   AMPUTATION Right 09/17/2018   Procedure: RIGHT AMPUTATION BELOW KNEE;  Surgeon: Newt Minion, MD;  Location: [place];  Service: Orthopedics;  Laterality: Right;   APPLICATION OF WOUND VAC Right 09/17/2018   Procedure: Application Of Wound Vac;  Surgeon: Newt Minion, MD;  Location: [place];  Service: Orthopedics;  Laterality: Right;   BELOW KNEE LEG AMPUTATION Right 09/17/2018   CORONARY ANGIOPLASTY  03/05/2008   stent   FOOT SURGERY     PERIPHERAL ARTERIAL STENT GRAFT Right 2014   leg 2 stents- clotted per pat   STENT PLACEMENT VASCULAR ([place] [facility])     Several in right leg  TONSILLECTOMY     TRANSMETATARSAL AMPUTATION Right 07/23/2018    Current Facility-Administered Medications:    [START ON 07/25/2022]  stroke: early stages of recovery book, , Does not apply, Once, Opyd, Lavone Neri, MD   0.9 %  sodium chloride infusion, , Intravenous, Continuous, Opyd, Lavone Neri, MD, Last Rate: 100 mL/hr at 07/24/22 0255, New Bag at 07/24/22 0255   acetaminophen (TYLENOL) tablet 650 mg, 650 mg, Oral, Q4H PRN **OR** acetaminophen (TYLENOL) 160 MG/5ML solution 650 mg, 650 mg, Per Tube, Q4H PRN **OR** acetaminophen (TYLENOL) suppository 650 mg, 650 mg, Rectal, Q4H PRN, Opyd, Lavone Neri, MD   aspirin EC tablet 81 mg, 81 mg, Oral, Daily,  Opyd, Lavone Neri, MD   atorvastatin (LIPITOR) tablet 20 mg, 20 mg, Oral, Daily, Opyd, Lavone Neri, MD   clopidogrel (PLAVIX) tablet 75 mg, 75 mg, Oral, Daily, Conrado Nance L, MD   enoxaparin (LOVENOX) injection 40 mg, 40 mg, Subcutaneous, Q24H, Opyd, Timothy S, MD   insulin aspart (novoLOG) injection 0-5 Units, 0-5 Units, Subcutaneous, QHS, Opyd, Timothy S, MD   insulin aspart (novoLOG) injection 0-6 Units, 0-6 Units, Subcutaneous, TID WC, Opyd, Lavone Neri, MD   insulin glargine-yfgn (SEMGLEE) injection 10 Units, 10 Units, Subcutaneous, Daily, Opyd, Lavone Neri, MD   senna-docusate (Senokot-S) tablet 1 tablet, 1 tablet, Oral, QHS PRN, Opyd, Lavone Neri, MD   tamsulosin (FLOMAX) capsule 0.4 mg, 0.4 mg, Oral, Daily, Opyd, Lavone Neri, MD  Current Outpatient Medications:    acetaminophen (TYLENOL) 325 MG tablet, Take 1-2 tablets (325-650 mg total) by mouth every 6 (six) hours as needed for mild pain (pain score 1-3 or temp > 100.5)., Disp: , Rfl:    aspirin EC 81 MG tablet, Take 81 mg by mouth daily. , Disp: , Rfl:    atorvastatin (LIPITOR) 20 MG tablet, TAKE 1 TABLET BY MOUTH EVERY DAY, Disp: 30 tablet, Rfl: 2   Cholecalciferol (VITAMIN D3) 125 MCG (5000 UT) CAPS, 1 capsule, Disp: , Rfl:    clopidogrel (PLAVIX) 75 MG tablet, Take 1 tablet (75 mg total) by mouth at bedtime., Disp: 90 tablet, Rfl: 0   Continuous Blood Gluc Sensor (FREESTYLE LIBRE 14 DAY SENSOR) MISC, Place 1 patch onto the skin every 14 (fourteen) days., Disp: 6 each, Rfl: 2   Continuous Blood Gluc Transmit (DEXCOM G6 TRANSMITTER) MISC, See admin instructions., Disp: , Rfl:    ezetimibe (ZETIA) 10 MG tablet, TAKE 1 TABLET BY MOUTH EVERY DAY, Disp: 30 tablet, Rfl: 2   HUMALOG KWIKPEN 100 UNIT/ML KwikPen, Inject into the skin., Disp: , Rfl:    Insulin Glargine (LANTUS) 100 UNIT/ML Solostar Pen, Inject 27 Units into the skin at bedtime. (Patient taking differently: Inject 37 Units into the skin at bedtime.), Disp: 15 mL, Rfl: 11   linagliptin  (TRADJENTA) 5 MG TABS tablet, Take 1 tablet (5 mg total) by mouth daily., Disp: 30 tablet, Rfl: 3   methocarbamol (ROBAXIN) 500 MG tablet, Take 1 tablet (500 mg total) by mouth 2 (two) times daily as needed., Disp: 20 tablet, Rfl: 0   predniSONE (STERAPRED UNI-PAK 21 TAB) 5 MG (21) TBPK tablet, Take as directed, Disp: 21 tablet, Rfl: 0   tamsulosin (FLOMAX) 0.4 MG CAPS capsule, Take 0.4 mg by mouth daily., Disp: , Rfl:    vancomycin (VANCOCIN) 125 MG capsule, Take 1 capsule (125 mg total) by mouth as directed. Take 1 capsule by mouth four times daily for 2 weeks.  Take 1 capsule by mouth twice daily for 1 week.  Take  1 capsule by mouth once daily for 1 week. Then take 1 capsule by mouth every other day for 6 weeks. Then Stop., Disp: 98 capsule, Rfl: 0   Family History  Problem Relation Age of Onset   Diabetes Mellitus II Mother    Diabetes Mellitus II Father    Colon cancer Neg Hx    Esophageal cancer Neg Hx    Social History:  reports that he has been smoking cigarettes. He has a 3.75 pack-year smoking history. He has never used smokeless tobacco. He reports that he does not currently use alcohol. He reports that he does not use drugs.   Exam: Current vital signs: BP 120/72   Pulse 84   Temp 97.8 F (36.6 C) (Oral)   Resp 16   Ht 6\' 2"  (1.88 m)   Wt 82.6 kg   SpO2 98%   BMI 23.37 kg/m  Vital signs in last 24 hours: Temp:  [97.8 F (36.6 C)-98.4 F (36.9 C)] 97.8 F (36.6 C) (01/30 0030) Pulse Rate:  [80-88] 84 (01/30 0100) Resp:  [15-21] 16 (01/30 0100) BP: (108-160)/(59-98) 120/72 (01/30 0100) SpO2:  [95 %-100 %] 98 % (01/30 0100) Weight:  [82.6 kg] 82.6 kg (01/30 0030)   Physical Exam  Constitutional: Appears well-developed and well-nourished.  Psych: Affect appropriate to situation, pleasant and cooperative Eyes: No scleral injection HENT: No oropharyngeal obstruction.  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  Perfusing extremities well Respiratory: Effort normal, non-labored breathing GI: Soft.  No distension. There is no tenderness.  Skin: Warm dry and intact visible skin  Neuro: Mental Status: [name] awake, alert, oriented to person, place, month, year, and situation. [name] able to give a clear and coherent history. No signs of aphasia or neglect Cranial Nerves: II: Visual Fields are full. Pupils are equal, round, and reactive to light.   III,IV, VI: EOMI without ptosis or diploplia, although he does have saccadic pursuits V: Facial sensation is symmetric to temperature VII: Facial movement is symmetric.  VIII: hearing is intact to voice X: Uvula elevates symmetrically XI: Shoulder shrug is symmetric. XII: tongue is midline without atrophy or fasciculations.  Motor: Tone is normal. Bulk is normal. 5/5 strength was present in all four extremities, except for a subtle right upper extremity pronator drift, right lower extremity 4++/5 hip flexion.  Sensory: Sensation is reduced on the right side Deep Tendon Reflexes: Unable to elicit brachioradialis bilaterally.  1+ left patellar Cerebellar: FNF and HKS are intact bilaterally Gait:  Deferred  NIHSS total 3 Score breakdown: 1 point for sensory loss on the right side, 1 point for right upper extremity drift, 1 point for right lower extremity drift Performed at 3 AM   I have reviewed labs in epic and the results pertinent to this consultation are:  Basic Metabolic Panel: Recent Labs  Lab 07/23/22 2127  NA 133*  K 4.3  CL 102  CO2 22  GLUCOSE 147*  BUN 19  CREATININE 1.50*  CALCIUM 8.7*    CBC: Recent Labs  Lab 07/23/22 2127  WBC 11.4*  NEUTROABS 7.2  HGB 16.7  HCT 49.2  MCV 90.1  PLT 301    Coagulation Studies: No results for input(s): "LABPROT", "INR" in the last 72 hours.   Lab Results  Component Value Date   CHOL 93 (L) 06/10/2018   HDL 24 (L) 06/10/2018   LDLCALC 2 06/10/2018   TRIG 333 (H)  06/10/2018   CHOLHDL  3.9 06/10/2018   Lab Results  Component Value Date   HGBA1C 7.5 (H) 09/17/2018     I have reviewed the images obtained:  MRI brain  1. Small focus of acute ischemia within the left pons. No hemorrhage or mass effect. 2. Old left occipital infarct.  CTA head/neck 1. No emergent large vessel occlusion or hemodynamically significant stenosis of the head or neck. 2. 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  Impression: Left pontine stroke likely in the setting of small vessel risk factors leading to small vessel stroke.  Recommendations: # Left pontine stroke - Stroke labs HgbA1c, fasting lipid panel - Frequent neuro checks - Echocardiogram - Add 81 mg ASA daily to home plavix for a 21 day course - Continue home Plavix  75 mg daily  - Risk factor modification - Telemetry monitoring; 30 day event monitor on discharge if no arrythmias captured  - Blood pressure goal   - Permissive hypertension to 220/120 due to acute stroke - Stroke team to follow   [place] [phone] Available 7 PM to 7 AM, outside of these hours please call Neurologist on call as listed on Amion.

## 2022-07-24 NOTE — ED Provider Notes (Signed)
[facility] Provider Note   CSN: [id number] Arrival date & time: 07/23/22  2039     History  Chief Complaint  Patient presents with   Numbness    R side    Curtis Bradford is a 62 y.o. male is a 6-year-old male with history of hyperlipidemia, CKD, type 2 diabetes, prior CVA, CAD who is not currently on any anticoagulation but is on aspirin and Plavix who presents with concern for numbness and weakness on the right side of his body that started proximally 7 hours prior to his arrival to [place] earlier this afternoon.  Also endorsing some blurry vision in his left eye patient found to be weak in the right extremities by preceding ED pedal after [facility] at [facility].  Was also experiencing dizziness.  Underwent CT angiogram which revealed old strokes but no acute findings.  Case was discussed with neurology Dr. Curly Shores, who recommended transfer to [facility] for MRI.  HPI     Home Medications Prior to Admission medications   Medication Sig Start Date End Date Taking? Authorizing Provider  acetaminophen (TYLENOL) 325 MG tablet Take 1-2 tablets (325-650 mg total) by mouth every 6 (six) hours as needed for mild pain (pain score 1-3 or temp > 100.5). 10/02/18   Angiulli, Lavon Paganini, PA-C  aspirin EC 81 MG tablet Take 81 mg by mouth daily.     [provider]  atorvastatin (LIPITOR) 20 MG tablet TAKE 1 TABLET BY MOUTH EVERY DAY 03/31/20   Frann Rider, NP  Cholecalciferol (VITAMIN D3) 125 MCG (5000 UT) CAPS 1 capsule 10/26/20   [provider]  clopidogrel (PLAVIX) 75 MG tablet Take 1 tablet (75 mg total) by mouth at bedtime. 12/15/19   Frann Rider, NP  Continuous Blood Gluc Sensor (FREESTYLE LIBRE 14 DAY SENSOR) MISC Place 1 patch onto the skin every 14 (fourteen) days. 06/26/18   Elayne Snare, MD  Continuous Blood Gluc Transmit (DEXCOM G6 TRANSMITTER) MISC See admin instructions. 08/11/20   [provider]  ezetimibe (ZETIA) 10 MG tablet TAKE 1 TABLET BY MOUTH EVERY DAY 03/31/20   Frann Rider, NP  HUMALOG KWIKPEN 100 UNIT/ML KwikPen Inject into the skin. 07/21/20   [provider]  Insulin Glargine (LANTUS) 100 UNIT/ML Solostar Pen Inject 27 Units into the skin at bedtime. Patient taking differently: Inject 37 Units into the skin at bedtime. 10/02/18   Angiulli, Lavon Paganini, PA-C  linagliptin (TRADJENTA) 5 MG TABS tablet Take 1 tablet (5 mg total) by mouth daily. 10/02/18   Angiulli, Lavon Paganini, PA-C  methocarbamol (ROBAXIN) 500 MG tablet Take 1 tablet (500 mg total) by mouth 2 (two) times daily as needed. 03/28/21   Aundra Dubin, PA-C  predniSONE (STERAPRED UNI-PAK 21 TAB) 5 MG (21) TBPK tablet Take as directed 03/28/21   Aundra Dubin, PA-C  tamsulosin (FLOMAX) 0.4 MG CAPS capsule Take 0.4 mg by mouth daily. 07/21/20   [provider]  vancomycin (VANCOCIN) 125 MG capsule Take 1 capsule (125 mg total) by mouth as directed. Take 1 capsule by mouth four times daily for 2 weeks.  Take 1 capsule by mouth twice daily for 1 week.  Take 1 capsule by mouth once daily for 1 week. Then take 1 capsule by mouth every other day for 6 weeks. Then Stop. 11/24/18   Cirigliano, Vito V, DO      Allergies    Metformin  Review of Systems   Review of Systems  Eyes:  Positive for visual disturbance.  Neurological:  Positive for weakness and numbness.    Physical Exam Updated Vital Signs BP (!) 161/93   Pulse 87   Temp 97.8 F (36.6 C) (Oral)   Resp 15   Ht 6\' 2"  (1.88 m)   Wt 82.6 kg   SpO2 99%   BMI 23.37 kg/m  Physical Exam Vitals and nursing note reviewed.  Constitutional:      Appearance: He is not ill-appearing or toxic-appearing.  HENT:     Head: Normocephalic and atraumatic.     Mouth/Throat:     Mouth: Mucous membranes are moist.     Pharynx: No oropharyngeal exudate or posterior oropharyngeal erythema.  Eyes:     General:        Right eye: No discharge.         Left eye: No discharge.     Extraocular Movements: Extraocular movements intact.     Conjunctiva/sclera: Conjunctivae normal.     Pupils: Pupils are equal, round, and reactive to light.  Cardiovascular:     Rate and Rhythm: Normal rate and regular rhythm.     Pulses: Normal pulses.     Heart sounds: Normal heart sounds. No murmur heard.    Comments: Left BKA Pulmonary:     Effort: Pulmonary effort is normal. No respiratory distress.     Breath sounds: Normal breath sounds. No wheezing or rales.  Abdominal:     General: Bowel sounds are normal. There is no distension.     Palpations: Abdomen is soft.     Tenderness: There is no abdominal tenderness. There is no guarding or rebound.  Musculoskeletal:        General: No deformity.     Cervical back: Neck supple.     Right lower leg: No edema.     Left lower leg: No edema.  Skin:    General: Skin is warm and dry.     Capillary Refill: Capillary refill takes less than 2 seconds.  Neurological:     Mental Status: He is alert and oriented to person, place, and time.     GCS: GCS eye subscore is 4. GCS verbal subscore is 5. GCS motor subscore is 6.     Cranial Nerves: Cranial nerves 2-12 are intact.     Sensory: Sensory deficit present.     Motor: Weakness present. No pronator drift.     Coordination: Coordination is intact.     Gait: Gait is intact.     Comments: Grip strength 3/5 on right compared to 5/5 on left. Sensation deficit on the right compared to the left.   Psychiatric:        Mood and Affect: Mood normal.     ED Results / Procedures / Treatments   Labs (all labs ordered are listed, but only abnormal results are displayed) Labs Reviewed  CBC WITH DIFFERENTIAL/PLATELET - Abnormal; Notable for the following components:      Result Value   WBC 11.4 (*)    Abs Immature Granulocytes 0.08 (*)    All other components within normal limits  COMPREHENSIVE METABOLIC PANEL - Abnormal; Notable for the following  components:   Sodium 133 (*)    Glucose, Bld 147 (*)    Creatinine, Ser 1.50 (*)    Calcium 8.7 (*)    GFR, Estimated 53 (*)    All other components within normal limits  CBG MONITORING, ED -  Abnormal; Notable for the following components:   Glucose-Capillary 138 (*)    All other components within normal limits  CBG MONITORING, ED - Abnormal; Notable for the following components:   Glucose-Capillary 100 (*)    All other components within normal limits  TSH  HIV ANTIBODY (ROUTINE TESTING W REFLEX)  LIPID PANEL  HEMOGLOBIN A1C  CBC  COMPREHENSIVE METABOLIC PANEL    EKG None  Radiology MR BRAIN WO CONTRAST  Result Date: 07/24/2022 CLINICAL DATA:  Acute neurologic deficit EXAM: MRI HEAD WITHOUT CONTRAST TECHNIQUE: Multiplanar, multiecho pulse sequences of the brain and surrounding structures were obtained without intravenous contrast. COMPARISON:  None Available. FINDINGS: Brain: Small focus of acute ischemia within the left pons. Old left occipital infarct. Approximately 5 scattered chronic microhemorrhages. 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 There is multifocal hyperintense T2-weighted signal within the white matter. Generalized volume loss. The midline structures are normal. Vascular: Normal flow voids. Skull and upper cervical spine: Normal marrow signal. Sinuses/Orbits: Negative. Other: None. IMPRESSION: 1. Small focus of acute ischemia within the left pons. No hemorrhage or mass effect. 2. Old left occipital infarct. Electronically Signed   By: Ulyses Jarred M.D.   On: 07/24/2022 02:04   CT ANGIO HEAD NECK W WO CM  Addendum Date: 07/23/2022   ADDENDUM REPORT: 07/23/2022 22:51 ADDENDUM: There is mild atherosclerotic disease at both carotid bifurcations without hemodynamically significant stenosis. Electronically Signed   By: Ulyses Jarred M.D.   On: 07/23/2022 22:51   Result Date: 07/23/2022 CLINICAL DATA:  Right face, arm and leg numbness EXAM: CT ANGIOGRAPHY HEAD  AND NECK TECHNIQUE: Multidetector CT imaging of the head and neck was performed using the standard protocol during bolus administration of intravenous contrast. Multiplanar CT image reconstructions and MIPs were obtained to evaluate the vascular anatomy. Carotid stenosis measurements (when applicable) are obtained utilizing NASCET criteria, using the distal internal carotid diameter as the denominator. RADIATION DOSE REDUCTION: This exam was performed according to the departmental dose-optimization program which includes automated exposure control, adjustment of the mA and/or kV according to patient size and/or use of iterative reconstruction technique. CONTRAST:  67mL OMNIPAQUE IOHEXOL 350 MG/ML SOLN COMPARISON:  10/31/2017 FINDINGS: CT HEAD FINDINGS Brain: There is no mass, hemorrhage or extra-axial collection. There is generalized atrophy without lobar predilection. Old left parietotemporal infarct. There is hypoattenuation of the periventricular white matter, most commonly indicating chronic ischemic microangiopathy. Skull: The visualized skull base, calvarium and extracranial soft tissues are normal. Sinuses/Orbits: No fluid levels or advanced mucosal thickening of the visualized paranasal sinuses. No mastoid or middle ear effusion. The orbits are normal. CTA NECK FINDINGS SKELETON: There is no bony spinal canal stenosis. No lytic or blastic lesion. OTHER NECK: Normal pharynx, larynx and major salivary glands. No cervical lymphadenopathy. Unremarkable thyroid gland. UPPER CHEST: No pneumothorax or pleural effusion. No nodules or masses. AORTIC ARCH: There is calcific atherosclerosis of the aortic arch. There is no aneurysm, dissection or hemodynamically significant stenosis of the visualized portion of the aorta. Conventional 3 vessel aortic branching pattern. The visualized proximal subclavian arteries are widely patent. RIGHT CAROTID SYSTEM: Normal without aneurysm, dissection or stenosis. LEFT CAROTID  SYSTEM: Normal without aneurysm, dissection or stenosis. VERTEBRAL ARTERIES: Left dominant configuration. Both origins are clearly patent. There is no dissection, occlusion or flow-limiting stenosis to the skull base (V1-V3 segments). CTA HEAD FINDINGS POSTERIOR CIRCULATION: --Vertebral arteries: Normal V4 segments. --Inferior cerebellar arteries: Normal. --Basilar artery: Normal. --Superior cerebellar arteries: Normal. --Posterior cerebral arteries (PCA): Normal. ANTERIOR  CIRCULATION: --Intracranial internal carotid arteries: Atherosclerotic calcification of the internal carotid arteries at the skull base without hemodynamically significant stenosis. --Anterior cerebral arteries (ACA): Normal. Both A1 segments are present. Patent anterior communicating artery (a-comm). --Middle cerebral arteries (MCA): Normal. VENOUS SINUSES: As permitted by contrast timing, patent. ANATOMIC VARIANTS: None Review of the MIP images confirms the above findings. IMPRESSION: 1. No emergent large vessel occlusion or hemodynamically significant stenosis of the head or neck. 2. 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 Aortic atherosclerosis (ICD10-I70.0). Electronically Signed: By: Ulyses Jarred M.D. On: 07/23/2022 22:40    Procedures Procedures    Medications Ordered in ED Medications  aspirin EC tablet 81 mg (has no administration in time range)  atorvastatin (LIPITOR) tablet 20 mg (has no administration in time range)  tamsulosin (FLOMAX) capsule 0.4 mg (has no administration in time range)  clopidogrel (PLAVIX) tablet 75 mg (has no administration in time range)   stroke: early stages of recovery book (has no administration in time range)  0.9 %  sodium chloride infusion (has no administration in time range)  acetaminophen (TYLENOL) tablet 650 mg (has no administration in time range)    Or  acetaminophen (TYLENOL) 160 MG/5ML solution 650 mg (has no administration in time range)    Or   acetaminophen (TYLENOL) suppository 650 mg (has no administration in time range)  senna-docusate (Senokot-S) tablet 1 tablet (has no administration in time range)  enoxaparin (LOVENOX) injection 40 mg (has no administration in time range)  insulin glargine-yfgn (SEMGLEE) injection 12 Units (has no administration in time range)  insulin aspart (novoLOG) injection 0-6 Units (has no administration in time range)  insulin aspart (novoLOG) injection 0-5 Units (has no administration in time range)  iohexol (OMNIPAQUE) 350 MG/ML injection 75 mL (75 mLs Intravenous Contrast Given 07/23/22 2207)    ED Course/ Medical Decision Making/ A&P Clinical Course as of 07/24/22 0256  Tue Jul 24, 2022  0215 Consult to Dr. Curly Shores who is agreeable to seeing this patient in the ED for acute pontine stroke. I appreciate her collaboration in the care of this patient.  [RS]  0234 Consult to Dr. Myna Hidalgo, hospitalist who is agreeable to admitting this patient to his service>. I appreciate his collaboration in the care of this patient.  [RS]    Clinical Course User Index [RS] Giselle Brutus, Gypsy Balsam, PA-C                             Medical Decision Making 62 year old male presents to the ED with neurologic complaint.   Hypertensive on intake, vital signs otherwise normal.  Send in transfer from [facility].  DDx includes not limited to metabolic derangement, CVA, TIA, other infectious etiology.  Amount and/or Complexity of Data Reviewed Labs: ordered.    Details: CBC with mild leukocytosis of 11,000, CMP with creatinine of 1.5 near patient's baseline.  Mild hyponatremia of 133 Radiology: ordered.    Details: .  CT angiogram without LVO, old left parietotemporal infarcts and chronic ischemia secondary microangiopathy, atherosclerotic disease of bilateral carotid bifurcations without thickened stenosis.  MRI of the brain with acute pontine stroke on the left without mass effect or  hemorrhage.  ECG/medicine tests:     Details: EKG with NSR, no STEMI.   Risk Prescription drug management. Decision regarding hospitalization.   Case discussed with neurology as above who will consult patient service.  Patient will require admission to the hospital  for acute stroke.  Adam voiced understanding of his medical evaluation and treatment plan. Each of their questions answered to their expressed satisfaction. He is amenable to admission at this time.    This chart was dictated using voice recognition software, Dragon. Despite the best efforts of this provider to proofread and correct errors, errors may still occur which can change documentation meaning.   Final Clinical Impression(s) / ED Diagnoses Final diagnoses:  Right sided numbness  Right sided weakness  Dizziness  Nonintractable headache, unspecified chronicity pattern, unspecified headache type  Acute stroke due to ischemia [facility])    Rx / DC Orders ED Discharge Orders     None         Sherrilee Gilles 07/24/22 0256    Shon Baton, MD 07/24/22 [phone]

## 2022-07-24 NOTE — ED Notes (Signed)
ED TO INPATIENT HANDOFF REPORT  ED Nurse Name and Phone #:  Nicki Reaper [phone]  S Name/Age/Gender Nile Dear 62 y.o. male Room/Bed: 013C/013C  Code Status   Code Status: Full Code  Home/SNF/Other Home Patient oriented to: self, place, time, and situation Is this baseline? Yes   Triage Complete: Triage complete  Chief Complaint Acute ischemic stroke [facility]) [I63.9] CVA (cerebral vascular accident) ([place]) [I63.9]  Triage Note Pt c/o numbness on "whole R side of body," onset 2pm. States he feels touch & sensation, "but my whole body is just numb/ asleep, like I fell asleep on my arm." Pt w decreased sensation to R side. Endorses "a little bit" of blurred vision x1wk, "some HA behind R ear."  EDP at bedside to assess  Pt via [facility] from [employer] c/o right-sided numbness since around 2pm today. Hx previous strokes x 2 with no residual deficits per pt report. Pt noted to be unsteady on feet and states that he feels like his equilibrium mis "off" tonight. No other neuro symptoms noted or reported at time of arrival.    Allergies Allergies  Allergen Reactions   Metformin     Other reaction(s): seizure, ARF    Level of Care/Admitting Diagnosis ED Disposition     ED Disposition  Admit   Condition  --   [place]: [place] [[id number]]  Level of Care: Telemetry Medical [104]  May admit patient to [facility] or [facility] if equivalent level of care is available:: No  Covid Evaluation: Asymptomatic - no recent exposure (last 10 days) testing not required  Diagnosis: CVA (cerebral vascular accident) [facility]) [[id number]]  Admitting Physician: Aline August [[id number]]  Attending Physician: Aline August [[id number]]  Certification:: I certify this patient will need inpatient services for at least 2 midnights          B Medical/Surgery History Past Medical History:  Diagnosis Date   CAD (coronary artery disease)    CKD (chronic kidney  disease), stage III ([place])    Diabetes mellitus without complication ([place])    Type II   History of DVT (deep vein thrombosis)    History of kidney stones    passed stones - no surgery   HLD (hyperlipidemia)    Myocardial infarction [facility]) 2009   Peripheral vascular disease ([place])    right leg   Seizure ([place])    last one 2015   Stroke ([place]) 2012, 10/2017   2012-speech was effected- has come back.10/2017- numbness of right side no taste right  side. 2012-   Tobacco abuse    Past Surgical History:  Procedure Laterality Date   AMPUTATION Right 02/23/2017   Procedure: RIGHT FOOT 5TH RAY AMPUTATION;  Surgeon: Newt Minion, MD;  Location: [place];  Service: Orthopedics;  Laterality: Right;   AMPUTATION Right 04/23/2018   Procedure: Right Great Toe Amputation;  Surgeon: Newt Minion, MD;  Location: [place];  Service: Orthopedics;  Laterality: Right;   AMPUTATION Right 07/23/2018   Procedure: RIGHT TRANSMETATARSAL AMPUTATION, APPLY WOUND VAC;  Surgeon: Newt Minion, MD;  Location: [place];  Service: Orthopedics;  Laterality: Right;   AMPUTATION Right 09/17/2018   Procedure: RIGHT AMPUTATION BELOW KNEE;  Surgeon: Newt Minion, MD;  Location: [place];  Service: Orthopedics;  Laterality: Right;   APPLICATION OF WOUND VAC Right 09/17/2018   Procedure: Application Of Wound Vac;  Surgeon: Newt Minion, MD;  Location: [place];  Service: Orthopedics;  Laterality: Right;  BELOW KNEE LEG AMPUTATION Right 09/17/2018   CORONARY ANGIOPLASTY  03/05/2008   stent   FOOT SURGERY     PERIPHERAL ARTERIAL STENT GRAFT Right 2014   leg 2 stents- clotted per pat   STENT PLACEMENT VASCULAR ([place] [facility])     Several in right leg   TONSILLECTOMY     TRANSMETATARSAL AMPUTATION Right 07/23/2018     A IV Location/Drains/Wounds Patient Lines/Drains/Airways Status     Active Line/Drains/Airways     Name Placement date Placement time Site Days   Peripheral IV 07/23/22 20 G Anterior;Left;Proximal Forearm 07/23/22  2100   Forearm  1   Pressure Injury 02/15/17 Stage II -  Partial thickness loss of dermis presenting as a shallow open ulcer with a red, pink wound bed without slough. 02/15/17  --  -- 1985   Wound / Incision (Open or Dehisced) 02/15/17 Diabetic ulcer Foot Right;Posterior 02/15/17  0145  Foot  1985            Intake/Output Last 24 hours No intake or output data in the 24 hours ending 07/24/22 1725  Labs/Imaging Results for orders placed or performed during the hospital encounter of 07/23/22 (from the past 48 hour(s))  POC CBG, ED     Status: Abnormal   Collection Time: 07/23/22  9:09 PM  Result Value Ref Range   Glucose-Capillary 138 (H) 70 - 99 mg/dL    Comment: Glucose reference range applies only to samples taken after fasting for at least 8 hours.  CBC with Differential     Status: Abnormal   Collection Time: 07/23/22  9:27 PM  Result Value Ref Range   WBC 11.4 (H) 4.0 - 10.5 K/uL   RBC 5.46 4.22 - 5.81 MIL/uL   Hemoglobin 16.7 13.0 - 17.0 g/dL   HCT 49.2 39.0 - 52.0 %   MCV 90.1 80.0 - 100.0 fL   MCH 30.6 26.0 - 34.0 pg   MCHC 33.9 30.0 - 36.0 g/dL   RDW 14.3 11.5 - 15.5 %   Platelets 301 150 - 400 K/uL   nRBC 0.0 0.0 - 0.2 %   Neutrophils Relative % 63 %   Neutro Abs 7.2 1.7 - 7.7 K/uL   Lymphocytes Relative 24 %   Lymphs Abs 2.7 0.7 - 4.0 K/uL   Monocytes Relative 8 %   Monocytes Absolute 0.9 0.1 - 1.0 K/uL   Eosinophils Relative 3 %   Eosinophils Absolute 0.4 0.0 - 0.5 K/uL   Basophils Relative 1 %   Basophils Absolute 0.1 0.0 - 0.1 K/uL   Immature Granulocytes 1 %   Abs Immature Granulocytes 0.08 (H) 0.00 - 0.07 K/uL    Comment: Performed at [facility], [address] [place], [place] [address]  Comprehensive metabolic panel     Status: Abnormal   Collection Time: 07/23/22  9:27 PM  Result Value Ref Range   Sodium 133 (L) 135 - 145 mmol/L   Potassium 4.3 3.5 - 5.1 mmol/L   Chloride 102 98 - 111 mmol/L   CO2 22 22 - 32 mmol/L   Glucose, Bld 147 (H)  70 - 99 mg/dL    Comment: Glucose reference range applies only to samples taken after fasting for at least 8 hours.   BUN 19 6 - 20 mg/dL   Creatinine, Ser 1.50 (H) 0.61 - 1.24 mg/dL   Calcium 8.7 (L) 8.9 - 10.3 mg/dL   Total Protein 7.8 6.5 - 8.1 g/dL   Albumin 4.3 3.5 -  5.0 g/dL   AST 15 15 - 41 U/L   ALT 19 0 - 44 U/L   Alkaline Phosphatase 84 38 - 126 U/L   Total Bilirubin 0.5 0.3 - 1.2 mg/dL   GFR, Estimated 53 (L) >60 mL/min    Comment: (NOTE) Calculated using the CKD-EPI Creatinine Equation (2021)    Anion gap 9 5 - 15    Comment: Performed at [facility], [address] [place], [place] [address]  TSH     Status: None   Collection Time: 07/23/22  9:27 PM  Result Value Ref Range   TSH 2.032 0.350 - 4.500 uIU/mL    Comment: Performed by a 3rd Generation assay with a functional sensitivity of <=0.01 uIU/mL. Performed at [facility], [address], [place], [place] [address]   CBG monitoring, ED     Status: Abnormal   Collection Time: 07/24/22  2:54 AM  Result Value Ref Range   Glucose-Capillary 100 (H) 70 - 99 mg/dL    Comment: Glucose reference range applies only to samples taken after fasting for at least 8 hours.  HIV Antibody (routine testing w rflx)     Status: None   Collection Time: 07/24/22  4:42 AM  Result Value Ref Range   HIV Screen 4th Generation wRfx Non Reactive Non Reactive    Comment: Performed at [facility], [address], [place], [place] [address]  Lipid panel     Status: Abnormal   Collection Time: 07/24/22  4:42 AM  Result Value Ref Range   Cholesterol 89 0 - 200 mg/dL   Triglycerides 808 (H) <150 mg/dL   HDL 26 (L) >81 mg/dL   Total CHOL/HDL Ratio 3.4 RATIO   VLDL 42 (H) 0 - 40 mg/dL   LDL Cholesterol 21 0 - 99 mg/dL    Comment:        Total Cholesterol/HDL:CHD Risk Coronary Heart Disease Risk Table                     Men   Women  1/2 Average Risk   3.4   3.3  Average Risk       5.0   4.4  2 X Average Risk    9.6   7.1  3 X Average Risk  23.4   11.0        Use the calculated Patient Ratio above and the CHD Risk Table to determine the patient's CHD Risk.        ATP III CLASSIFICATION (LDL):  <100     mg/dL   Optimal  103-159  mg/dL   Near or Above                    Optimal  130-159  mg/dL   Borderline  458-592  mg/dL   High  >924     mg/dL   Very High Performed at [facility], [address] [place], [place] [address]   Hemoglobin A1c     Status: Abnormal   Collection Time: 07/24/22  4:42 AM  Result Value Ref Range   Hgb A1c MFr Bld 6.9 (H) 4.8 - 5.6 %    Comment: (NOTE) Pre diabetes:          5.7%-6.4%  Diabetes:              >6.4%  Glycemic control for   <7.0% adults with diabetes    Mean Plasma  Glucose 151.33 mg/dL    Comment: Performed at [facility], [address], [place], [place] [address]  CBC     Status: Abnormal   Collection Time: 07/24/22  4:42 AM  Result Value Ref Range   WBC 12.0 (H) 4.0 - 10.5 K/uL   RBC 4.81 4.22 - 5.81 MIL/uL   Hemoglobin 15.1 13.0 - 17.0 g/dL   HCT 29.5 28.4 - 13.2 %   MCV 91.9 80.0 - 100.0 fL   MCH 31.4 26.0 - 34.0 pg   MCHC 34.2 30.0 - 36.0 g/dL   RDW 44.0 10.2 - 72.5 %   Platelets 287 150 - 400 K/uL   nRBC 0.0 0.0 - 0.2 %    Comment: Performed at [facility], [address] [place], [place] [address]  Comprehensive metabolic panel     Status: Abnormal   Collection Time: 07/24/22  4:42 AM  Result Value Ref Range   Sodium 135 135 - 145 mmol/L   Potassium 4.0 3.5 - 5.1 mmol/L   Chloride 103 98 - 111 mmol/L   CO2 21 (L) 22 - 32 mmol/L   Glucose, Bld 131 (H) 70 - 99 mg/dL    Comment: Glucose reference range applies only to samples taken after fasting for at least 8 hours.   BUN 24 (H) 6 - 20 mg/dL   Creatinine, Ser 0.34 (H) 0.61 - 1.24 mg/dL   Calcium 8.6 (L) 8.9 - 10.3 mg/dL   Total Protein 5.8 (L) 6.5 - 8.1 g/dL   Albumin 3.4 (L) 3.5 - 5.0 g/dL   AST 19 15 - 41 U/L   ALT 17 0 - 44 U/L   Alkaline  Phosphatase 66 38 - 126 U/L   Total Bilirubin 0.6 0.3 - 1.2 mg/dL   GFR, Estimated 40 (L) >60 mL/min    Comment: (NOTE) Calculated using the CKD-EPI Creatinine Equation (2021)    Anion gap 11 5 - 15    Comment: Performed at [facility], [address] [place], [place] [address]  CBG monitoring, ED     Status: Abnormal   Collection Time: 07/24/22 10:13 AM  Result Value Ref Range   Glucose-Capillary 170 (H) 70 - 99 mg/dL    Comment: Glucose reference range applies only to samples taken after fasting for at least 8 hours.  CBG monitoring, ED     Status: Abnormal   Collection Time: 07/24/22 11:45 AM  Result Value Ref Range   Glucose-Capillary 172 (H) 70 - 99 mg/dL    Comment: Glucose reference range applies only to samples taken after fasting for at least 8 hours.   ECHOCARDIOGRAM COMPLETE  Result Date: 07/24/2022    ECHOCARDIOGRAM REPORT   Patient Name:   LORENE SAMAAN Date of Exam: 07/24/2022 Medical Rec #:  [id number]   Height:       74.0 in Accession #:    [id number]  Weight:       182.0 lb Date of Birth:  1960-10-01  BSA:          2.088 m Patient Age:    61 years    BP:           152/87 mmHg Patient Gender: M           HR:           85 bpm. Exam Location:  Inpatient Procedure: 2D Echo, Cardiac Doppler, Color Doppler and Intracardiac  Opacification Agent Indications:    stroke  History:        Patient has no prior history of Echocardiogram examinations.                 CAD; Risk Factors:Diabetes, Hypertension and Dyslipidemia.  Sonographer:    Melissa Morford RDCS (AE, PE) Referring Phys: [id number] TIMOTHY S OPYD IMPRESSIONS  1. Hypokinesis of the apical septum and apex. No evidence of thrombus on contrast imaging. Left ventricular ejection fraction, by estimation, is 50 to 55%. The left ventricle has low normal function. The left ventricle demonstrates regional wall motion abnormalities (see scoring diagram/findings for description). Indeterminate diastolic filling due to E-A  fusion.  2. Right ventricular systolic function is normal. The right ventricular size is normal. Tricuspid regurgitation signal is inadequate for assessing PA pressure.  3. The mitral valve is grossly normal. Trivial mitral valve regurgitation. No evidence of mitral stenosis.  4. The aortic valve is tricuspid. There is moderate calcification of the aortic valve. There is moderate thickening of the aortic valve. Aortic valve regurgitation is not visualized. Aortic valve sclerosis/calcification is present, without any evidence of aortic stenosis.  5. The inferior vena cava is normal in size with greater than 50% respiratory variability, suggesting right atrial pressure of 3 mmHg. Comparison(s): Prior images unable to be directly viewed, comparison made by report only. No significant change from prior study. Similar apical wall motion abnormality described on prior echo. FINDINGS  Left Ventricle: Hypokinesis of the apical septum and apex. No evidence of thrombus on contrast imaging. Left ventricular ejection fraction, by estimation, is 50 to 55%. The left ventricle has low normal function. The left ventricle demonstrates regional  wall motion abnormalities. Definity contrast agent was given IV to delineate the left ventricular endocardial borders. The left ventricular internal cavity size was normal in size. There is no left ventricular hypertrophy. Indeterminate diastolic filling due to E-A fusion.  LV Wall Scoring: The apical septal segment and apex are hypokinetic. Right Ventricle: The right ventricular size is normal. No increase in right ventricular wall thickness. Right ventricular systolic function is normal. Tricuspid regurgitation signal is inadequate for assessing PA pressure. Left Atrium: Left atrial size was normal in size. Right Atrium: Right atrial size was normal in size. Pericardium: There is no evidence of pericardial effusion. Mitral Valve: The mitral valve is grossly normal. Trivial mitral valve  regurgitation. No evidence of mitral valve stenosis. Tricuspid Valve: The tricuspid valve is grossly normal. Tricuspid valve regurgitation is not demonstrated. No evidence of tricuspid stenosis. Aortic Valve: The aortic valve is tricuspid. There is moderate calcification of the aortic valve. There is moderate thickening of the aortic valve. Aortic valve regurgitation is not visualized. Aortic valve sclerosis/calcification is present, without any  evidence of aortic stenosis. Pulmonic Valve: The pulmonic valve was grossly normal. Pulmonic valve regurgitation is not visualized. No evidence of pulmonic stenosis. Aorta: The aortic root is normal in size and structure. Venous: The inferior vena cava is normal in size with greater than 50% respiratory variability, suggesting right atrial pressure of 3 mmHg. IAS/Shunts: The atrial septum is grossly normal.  LEFT VENTRICLE PLAX 2D LVIDd:         3.90 cm      Diastology LVIDs:         3.20 cm      LV e' medial:    3.59 cm/s LV PW:         1.10 cm      LV E/e' medial:  1.0 LV IVS:        1.10 cm      LV e' lateral:   3.70 cm/s LVOT diam:     2.20 cm      LV E/e' lateral: 1.0 LV SV:         55 LV SV Index:   26 LVOT Area:     3.80 cm  LV Volumes (MOD) LV vol d, MOD A2C: 131.0 ml LV vol d, MOD A4C: 127.0 ml LV vol s, MOD A2C: 96.2 ml LV vol s, MOD A4C: 76.7 ml LV SV MOD A2C:     34.8 ml LV SV MOD A4C:     127.0 ml LV SV MOD BP:      44.7 ml RIGHT VENTRICLE RV S prime:     13.40 cm/s TAPSE (M-mode): 1.9 cm LEFT ATRIUM             Index        RIGHT ATRIUM           Index LA diam:        3.30 cm 1.58 cm/m   RA Area:     11.90 cm LA Vol (A2C):   46.3 ml 22.17 ml/m  RA Volume:   27.10 ml  12.98 ml/m LA Vol (A4C):   37.5 ml 17.96 ml/m LA Biplane Vol: 41.7 ml 19.97 ml/m  AORTIC VALVE LVOT Vmax:   72.50 cm/s LVOT Vmean:  54.000 cm/s LVOT VTI:    0.144 m  AORTA Ao Root diam: 2.90 cm MV E velocity: 3.59 cm/s                           SHUNTS                           Systemic VTI:   0.14 m                           Systemic Diam: 2.20 cm Eleonore Chiquito MD Electronically signed by Eleonore Chiquito MD Signature Date/Time: 07/24/2022/10:53:23 AM    Final    MR BRAIN WO CONTRAST  Result Date: 07/24/2022 CLINICAL DATA:  Acute neurologic deficit EXAM: MRI HEAD WITHOUT CONTRAST TECHNIQUE: Multiplanar, multiecho pulse sequences of the brain and surrounding structures were obtained without intravenous contrast. COMPARISON:  None Available. FINDINGS: Brain: Small focus of acute ischemia within the left pons. Old left occipital infarct. Approximately 5 scattered chronic microhemorrhages. 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 There is multifocal hyperintense T2-weighted signal within the white matter. Generalized volume loss. The midline structures are normal. Vascular: Normal flow voids. Skull and upper cervical spine: Normal marrow signal. Sinuses/Orbits: Negative. Other: None. IMPRESSION: 1. Small focus of acute ischemia within the left pons. No hemorrhage or mass effect. 2. Old left occipital infarct. Electronically Signed   By: Ulyses Jarred M.D.   On: 07/24/2022 02:04   CT ANGIO HEAD NECK W WO CM  Addendum Date: 07/23/2022   ADDENDUM REPORT: 07/23/2022 22:51 ADDENDUM: There is mild atherosclerotic disease at both carotid bifurcations without hemodynamically significant stenosis. Electronically Signed   By: Ulyses Jarred M.D.   On: 07/23/2022 22:51   Result Date: 07/23/2022 CLINICAL DATA:  Right face, arm and leg numbness EXAM: CT ANGIOGRAPHY HEAD AND NECK TECHNIQUE: Multidetector CT imaging of the head and neck was performed using the standard protocol during bolus administration  of intravenous contrast. Multiplanar CT image reconstructions and MIPs were obtained to evaluate the vascular anatomy. Carotid stenosis measurements (when applicable) are obtained utilizing NASCET criteria, using the distal internal carotid diameter as the denominator. RADIATION DOSE REDUCTION: This exam  was performed according to the departmental dose-optimization program which includes automated exposure control, adjustment of the mA and/or kV according to patient size and/or use of iterative reconstruction technique. CONTRAST:  13mL OMNIPAQUE IOHEXOL 350 MG/ML SOLN COMPARISON:  10/31/2017 FINDINGS: CT HEAD FINDINGS Brain: There is no mass, hemorrhage or extra-axial collection. There is generalized atrophy without lobar predilection. Old left parietotemporal infarct. There is hypoattenuation of the periventricular white matter, most commonly indicating chronic ischemic microangiopathy. Skull: The visualized skull base, calvarium and extracranial soft tissues are normal. Sinuses/Orbits: No fluid levels or advanced mucosal thickening of the visualized paranasal sinuses. No mastoid or middle ear effusion. The orbits are normal. CTA NECK FINDINGS SKELETON: There is no bony spinal canal stenosis. No lytic or blastic lesion. OTHER NECK: Normal pharynx, larynx and major salivary glands. No cervical lymphadenopathy. Unremarkable thyroid gland. UPPER CHEST: No pneumothorax or pleural effusion. No nodules or masses. AORTIC ARCH: There is calcific atherosclerosis of the aortic arch. There is no aneurysm, dissection or hemodynamically significant stenosis of the visualized portion of the aorta. Conventional 3 vessel aortic branching pattern. The visualized proximal subclavian arteries are widely patent. RIGHT CAROTID SYSTEM: Normal without aneurysm, dissection or stenosis. LEFT CAROTID SYSTEM: Normal without aneurysm, dissection or stenosis. VERTEBRAL ARTERIES: Left dominant configuration. Both origins are clearly patent. There is no dissection, occlusion or flow-limiting stenosis to the skull base (V1-V3 segments). CTA HEAD FINDINGS POSTERIOR CIRCULATION: --Vertebral arteries: Normal V4 segments. --Inferior cerebellar arteries: Normal. --Basilar artery: Normal. --Superior cerebellar arteries: Normal. --Posterior cerebral  arteries (PCA): Normal. ANTERIOR CIRCULATION: --Intracranial internal carotid arteries: Atherosclerotic calcification of the internal carotid arteries at the skull base without hemodynamically significant stenosis. --Anterior cerebral arteries (ACA): Normal. Both A1 segments are present. Patent anterior communicating artery (a-comm). --Middle cerebral arteries (MCA): Normal. VENOUS SINUSES: As permitted by contrast timing, patent. ANATOMIC VARIANTS: None Review of the MIP images confirms the above findings. IMPRESSION: 1. No emergent large vessel occlusion or hemodynamically significant stenosis of the head or neck. 2. 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 Aortic atherosclerosis (ICD10-I70.0). Electronically Signed: By: Ulyses Jarred M.D. On: 07/23/2022 22:40    Pending Labs Unresulted Labs (From admission, onward)     Start     Ordered   07/31/22 0500  Creatinine, serum  (enoxaparin (LOVENOX)    CrCl >/= 30 ml/min)  Weekly,   R     Comments: while on enoxaparin therapy    07/24/22 0242   07/25/22 0500  CBC with Differential/Platelet  Tomorrow morning,   R        07/24/22 0959   07/25/22 1093  Basic metabolic panel  Tomorrow morning,   R        07/24/22 0959   07/25/22 0500  Magnesium  Tomorrow morning,   R        07/24/22 0959            Vitals/Pain Today's Vitals   07/24/22 1042 07/24/22 1043 07/24/22 1045 07/24/22 1300  BP:   117/82   Pulse: 89 91 89   Resp: (!) 23 19 17    Temp:    98.7 F (37.1 C)  TempSrc:    Oral  SpO2: 98% 97% 99%   Weight:      Height:  PainSc:        Isolation Precautions No active isolations  Medications Medications  aspirin EC tablet 81 mg (81 mg Oral Given 07/24/22 1041)  atorvastatin (LIPITOR) tablet 20 mg (20 mg Oral Given 07/24/22 1041)  tamsulosin (FLOMAX) capsule 0.4 mg (0.4 mg Oral Patient Refused/Not Given 07/24/22 1042)   stroke: early stages of recovery book (has no administration in time range)   acetaminophen (TYLENOL) tablet 650 mg (has no administration in time range)    Or  acetaminophen (TYLENOL) 160 MG/5ML solution 650 mg (has no administration in time range)    Or  acetaminophen (TYLENOL) suppository 650 mg (has no administration in time range)  senna-docusate (Senokot-S) tablet 1 tablet (has no administration in time range)  enoxaparin (LOVENOX) injection 40 mg (40 mg Subcutaneous Patient Refused/Not Given 07/24/22 1042)  insulin aspart (novoLOG) injection 0-6 Units (1 Units Subcutaneous Patient Refused/Not Given 07/24/22 1147)  insulin aspart (novoLOG) injection 0-5 Units (has no administration in time range)  insulin glargine-yfgn (SEMGLEE) injection 10 Units (10 Units Subcutaneous Given 07/24/22 1043)  clopidogrel (PLAVIX) tablet 75 mg (75 mg Oral Given 07/24/22 0500)  0.9 %  sodium chloride infusion ( Intravenous New Bag/Given 07/24/22 0737)  perflutren lipid microspheres (DEFINITY) IV suspension (2 mLs Intravenous Given 07/24/22 1032)  ondansetron (ZOFRAN) injection 4 mg (4 mg Intravenous Given 07/24/22 1256)  metoCLOPramide (REGLAN) injection 10 mg (has no administration in time range)  Study - LIBREXIA-STROKE - milvexian 25 mg or placebo tablet (PI-Sethi) (1 tablet Oral Given 07/24/22 1712)  iohexol (OMNIPAQUE) 350 MG/ML injection 75 mL (75 mLs Intravenous Contrast Given 07/23/22 2207)  ondansetron (ZOFRAN) injection 4 mg (4 mg Intravenous Given 07/24/22 1301)    Mobility walks with device     Focused Assessments Neuro Assessment Handoff:  Swallow screen pass? Yes    NIH Stroke Scale  Dizziness Present: No Headache Present: No Interval: Shift assessment Level of Consciousness (1a.)   : Alert, keenly responsive LOC Questions (1b. )   : Answers both questions correctly LOC Commands (1c. )   : Performs both tasks correctly Best Gaze (2. )  : Normal Visual (3. )  : No visual loss Facial Palsy (4. )    : Normal symmetrical movements Motor Arm, Left (5a. )   : No  drift Motor Arm, Right (5b. ) : No drift Motor Leg, Left (6a. )  : No drift Motor Leg, Right (6b. ) : No drift Limb Ataxia (7. ): Absent Sensory (8. )  : Mild-to-moderate sensory loss, patient feels pinprick is less sharp or is dull on the affected side, or there is a loss of superficial pain with pinprick, but patient is aware of being touched Best Language (9. )  : No aphasia Dysarthria (10. ): Normal Extinction/Inattention (11.)   : No Abnormality Complete NIHSS TOTAL: 1     Neuro Assessment: Exceptions to WDL Neuro Checks:   Initial (07/23/22 2105)  Has TPA been given? No If patient is a Neuro Trauma and patient is going to OR before floor call report to 4N Charge nurse: [phone] or [phone]   R Recommendations: See Admitting Provider Note  Report given to:   Additional Notes:  Aox4, ambulated well, here for stroke, right sided decreased sensation, passed swallow, ambulated well, R BKA with prosthetic, experiencing N/V despite PRN, MD aware an waiting new orders.

## 2022-07-24 NOTE — Progress Notes (Signed)
Patient admitted early this morning for right-sided numbness and balance difficulty and was found to have acute ischemic stroke.  Neurology has been consulted. Patient seen and examined at bedside.  I have reviewed patient's medical records myself including this morning's H&P, current vitals, labs and medications.  PT/OT/SLP evaluation.

## 2022-07-24 NOTE — ED Notes (Signed)
Patient transported to MRI 

## 2022-07-24 NOTE — Progress Notes (Signed)
Stroke education given to Pt. Specifically, BEFAST and 911 activation. Individual risk factors discussed, (smoking, diabetes). Fist dose of Librexia (study med) given.  [place] [facility] RN Stroke Response

## 2022-07-24 NOTE — ED Notes (Signed)
ED TO INPATIENT HANDOFF REPORT  ED Nurse Name and Phone #:  Nicki Reaper [phone]  S Name/Age/Gender Curtis Bradford 62 y.o. male Room/Bed: 013C/013C  Code Status   Code Status: Full Code  Home/SNF/Other Home Patient oriented to: self, place, time, and situation Is this baseline? Yes   Triage Complete: Triage complete  Chief Complaint Acute ischemic stroke [facility]) [I63.9] CVA (cerebral vascular accident) ([place]) [I63.9]  Triage Note Pt c/o numbness on "whole R side of body," onset 2pm. States he feels touch & sensation, "but my whole body is just numb/ asleep, like I fell asleep on my arm." Pt w decreased sensation to R side. Endorses "a little bit" of blurred vision x1wk, "some HA behind R ear."  EDP at bedside to assess  Pt via [facility] from [employer] c/o right-sided numbness since around 2pm today. Hx previous strokes x 2 with no residual deficits per pt report. Pt noted to be unsteady on feet and states that he feels like his equilibrium mis "off" tonight. No other neuro symptoms noted or reported at time of arrival.    Allergies Allergies  Allergen Reactions   Metformin     Other reaction(s): seizure, ARF    Level of Care/Admitting Diagnosis ED Disposition     ED Disposition  Admit   Condition  --   [place]: [place] [[id number]]  Level of Care: Telemetry Medical [104]  May admit patient to [facility] or [facility] if equivalent level of care is available:: No  Covid Evaluation: Asymptomatic - no recent exposure (last 10 days) testing not required  Diagnosis: CVA (cerebral vascular accident) [facility]) [[id number]]  Admitting Physician: Aline August [[id number]]  Attending Physician: Aline August [[id number]]  Certification:: I certify this patient will need inpatient services for at least 2 midnights          B Medical/Surgery History Past Medical History:  Diagnosis Date   CAD (coronary artery disease)    CKD (chronic kidney  disease), stage III ([place])    Diabetes mellitus without complication ([place])    Type II   History of DVT (deep vein thrombosis)    History of kidney stones    passed stones - no surgery   HLD (hyperlipidemia)    Myocardial infarction [facility]) 2009   Peripheral vascular disease ([place])    right leg   Seizure ([place])    last one 2015   Stroke ([place]) 2012, 10/2017   2012-speech was effected- has come back.10/2017- numbness of right side no taste right  side. 2012-   Tobacco abuse    Past Surgical History:  Procedure Laterality Date   AMPUTATION Right 02/23/2017   Procedure: RIGHT FOOT 5TH RAY AMPUTATION;  Surgeon: Newt Minion, MD;  Location: [place];  Service: Orthopedics;  Laterality: Right;   AMPUTATION Right 04/23/2018   Procedure: Right Great Toe Amputation;  Surgeon: Newt Minion, MD;  Location: [place];  Service: Orthopedics;  Laterality: Right;   AMPUTATION Right 07/23/2018   Procedure: RIGHT TRANSMETATARSAL AMPUTATION, APPLY WOUND VAC;  Surgeon: Newt Minion, MD;  Location: [place];  Service: Orthopedics;  Laterality: Right;   AMPUTATION Right 09/17/2018   Procedure: RIGHT AMPUTATION BELOW KNEE;  Surgeon: Newt Minion, MD;  Location: [place];  Service: Orthopedics;  Laterality: Right;   APPLICATION OF WOUND VAC Right 09/17/2018   Procedure: Application Of Wound Vac;  Surgeon: Newt Minion, MD;  Location: [place];  Service: Orthopedics;  Laterality: Right;  BELOW KNEE LEG AMPUTATION Right 09/17/2018   CORONARY ANGIOPLASTY  03/05/2008   stent   FOOT SURGERY     PERIPHERAL ARTERIAL STENT GRAFT Right 2014   leg 2 stents- clotted per pat   STENT PLACEMENT VASCULAR ([place] [facility])     Several in right leg   TONSILLECTOMY     TRANSMETATARSAL AMPUTATION Right 07/23/2018     A IV Location/Drains/Wounds Patient Lines/Drains/Airways Status     Active Line/Drains/Airways     Name Placement date Placement time Site Days   Peripheral IV 07/23/22 20 G Anterior;Left;Proximal Forearm 07/23/22  2100   Forearm  1   Pressure Injury 02/15/17 Stage II -  Partial thickness loss of dermis presenting as a shallow open ulcer with a red, pink wound bed without slough. 02/15/17  --  -- 1985   Wound / Incision (Open or Dehisced) 02/15/17 Diabetic ulcer Foot Right;Posterior 02/15/17  0145  Foot  1985            Intake/Output Last 24 hours No intake or output data in the 24 hours ending 07/24/22 1439  Labs/Imaging Results for orders placed or performed during the hospital encounter of 07/23/22 (from the past 48 hour(s))  POC CBG, ED     Status: Abnormal   Collection Time: 07/23/22  9:09 PM  Result Value Ref Range   Glucose-Capillary 138 (H) 70 - 99 mg/dL    Comment: Glucose reference range applies only to samples taken after fasting for at least 8 hours.  CBC with Differential     Status: Abnormal   Collection Time: 07/23/22  9:27 PM  Result Value Ref Range   WBC 11.4 (H) 4.0 - 10.5 K/uL   RBC 5.46 4.22 - 5.81 MIL/uL   Hemoglobin 16.7 13.0 - 17.0 g/dL   HCT 49.2 39.0 - 52.0 %   MCV 90.1 80.0 - 100.0 fL   MCH 30.6 26.0 - 34.0 pg   MCHC 33.9 30.0 - 36.0 g/dL   RDW 14.3 11.5 - 15.5 %   Platelets 301 150 - 400 K/uL   nRBC 0.0 0.0 - 0.2 %   Neutrophils Relative % 63 %   Neutro Abs 7.2 1.7 - 7.7 K/uL   Lymphocytes Relative 24 %   Lymphs Abs 2.7 0.7 - 4.0 K/uL   Monocytes Relative 8 %   Monocytes Absolute 0.9 0.1 - 1.0 K/uL   Eosinophils Relative 3 %   Eosinophils Absolute 0.4 0.0 - 0.5 K/uL   Basophils Relative 1 %   Basophils Absolute 0.1 0.0 - 0.1 K/uL   Immature Granulocytes 1 %   Abs Immature Granulocytes 0.08 (H) 0.00 - 0.07 K/uL    Comment: Performed at [facility], [address] [place], [place] [address]  Comprehensive metabolic panel     Status: Abnormal   Collection Time: 07/23/22  9:27 PM  Result Value Ref Range   Sodium 133 (L) 135 - 145 mmol/L   Potassium 4.3 3.5 - 5.1 mmol/L   Chloride 102 98 - 111 mmol/L   CO2 22 22 - 32 mmol/L   Glucose, Bld 147 (H)  70 - 99 mg/dL    Comment: Glucose reference range applies only to samples taken after fasting for at least 8 hours.   BUN 19 6 - 20 mg/dL   Creatinine, Ser 1.50 (H) 0.61 - 1.24 mg/dL   Calcium 8.7 (L) 8.9 - 10.3 mg/dL   Total Protein 7.8 6.5 - 8.1 g/dL   Albumin 4.3 3.5 -  5.0 g/dL   AST 15 15 - 41 U/L   ALT 19 0 - 44 U/L   Alkaline Phosphatase 84 38 - 126 U/L   Total Bilirubin 0.5 0.3 - 1.2 mg/dL   GFR, Estimated 53 (L) >60 mL/min    Comment: (NOTE) Calculated using the CKD-EPI Creatinine Equation (2021)    Anion gap 9 5 - 15    Comment: Performed at [facility], [address] [place], [place] [address]  TSH     Status: None   Collection Time: 07/23/22  9:27 PM  Result Value Ref Range   TSH 2.032 0.350 - 4.500 uIU/mL    Comment: Performed by a 3rd Generation assay with a functional sensitivity of <=0.01 uIU/mL. Performed at [facility], [address] [place], [place] [address]   CBG monitoring, ED     Status: Abnormal   Collection Time: 07/24/22  2:54 AM  Result Value Ref Range   Glucose-Capillary 100 (H) 70 - 99 mg/dL    Comment: Glucose reference range applies only to samples taken after fasting for at least 8 hours.  HIV Antibody (routine testing w rflx)     Status: None   Collection Time: 07/24/22  4:42 AM  Result Value Ref Range   HIV Screen 4th Generation wRfx Non Reactive Non Reactive    Comment: Performed at [facility], [address], [place], [place] [address]  Lipid panel     Status: Abnormal   Collection Time: 07/24/22  4:42 AM  Result Value Ref Range   Cholesterol 89 0 - 200 mg/dL   Triglycerides 712 (H) <150 mg/dL   HDL 26 (L) >45 mg/dL   Total CHOL/HDL Ratio 3.4 RATIO   VLDL 42 (H) 0 - 40 mg/dL   LDL Cholesterol 21 0 - 99 mg/dL    Comment:        Total Cholesterol/HDL:CHD Risk Coronary Heart Disease Risk Table                     Men   Women  1/2 Average Risk   3.4   3.3  Average Risk       5.0   4.4  2 X Average Risk    9.6   7.1  3 X Average Risk  23.4   11.0        Use the calculated Patient Ratio above and the CHD Risk Table to determine the patient's CHD Risk.        ATP III CLASSIFICATION (LDL):  <100     mg/dL   Optimal  809-983  mg/dL   Near or Above                    Optimal  130-159  mg/dL   Borderline  382-505  mg/dL   High  >397     mg/dL   Very High Performed at [facility], [address], [place], [place] [address]   Hemoglobin A1c     Status: Abnormal   Collection Time: 07/24/22  4:42 AM  Result Value Ref Range   Hgb A1c MFr Bld 6.9 (H) 4.8 - 5.6 %    Comment: (NOTE) Pre diabetes:          5.7%-6.4%  Diabetes:              >6.4%  Glycemic control for   <7.0% adults with diabetes    Mean Plasma  Glucose 151.33 mg/dL    Comment: Performed at [facility], [address], [place], [place] [address]  CBC     Status: Abnormal   Collection Time: 07/24/22  4:42 AM  Result Value Ref Range   WBC 12.0 (H) 4.0 - 10.5 K/uL   RBC 4.81 4.22 - 5.81 MIL/uL   Hemoglobin 15.1 13.0 - 17.0 g/dL   HCT 29.5 28.4 - 13.2 %   MCV 91.9 80.0 - 100.0 fL   MCH 31.4 26.0 - 34.0 pg   MCHC 34.2 30.0 - 36.0 g/dL   RDW 44.0 10.2 - 72.5 %   Platelets 287 150 - 400 K/uL   nRBC 0.0 0.0 - 0.2 %    Comment: Performed at [facility], [address] [place], [place] [address]  Comprehensive metabolic panel     Status: Abnormal   Collection Time: 07/24/22  4:42 AM  Result Value Ref Range   Sodium 135 135 - 145 mmol/L   Potassium 4.0 3.5 - 5.1 mmol/L   Chloride 103 98 - 111 mmol/L   CO2 21 (L) 22 - 32 mmol/L   Glucose, Bld 131 (H) 70 - 99 mg/dL    Comment: Glucose reference range applies only to samples taken after fasting for at least 8 hours.   BUN 24 (H) 6 - 20 mg/dL   Creatinine, Ser 0.34 (H) 0.61 - 1.24 mg/dL   Calcium 8.6 (L) 8.9 - 10.3 mg/dL   Total Protein 5.8 (L) 6.5 - 8.1 g/dL   Albumin 3.4 (L) 3.5 - 5.0 g/dL   AST 19 15 - 41 U/L   ALT 17 0 - 44 U/L   Alkaline  Phosphatase 66 38 - 126 U/L   Total Bilirubin 0.6 0.3 - 1.2 mg/dL   GFR, Estimated 40 (L) >60 mL/min    Comment: (NOTE) Calculated using the CKD-EPI Creatinine Equation (2021)    Anion gap 11 5 - 15    Comment: Performed at [facility], [address] [place], [place] [address]  CBG monitoring, ED     Status: Abnormal   Collection Time: 07/24/22 10:13 AM  Result Value Ref Range   Glucose-Capillary 170 (H) 70 - 99 mg/dL    Comment: Glucose reference range applies only to samples taken after fasting for at least 8 hours.  CBG monitoring, ED     Status: Abnormal   Collection Time: 07/24/22 11:45 AM  Result Value Ref Range   Glucose-Capillary 172 (H) 70 - 99 mg/dL    Comment: Glucose reference range applies only to samples taken after fasting for at least 8 hours.   ECHOCARDIOGRAM COMPLETE  Result Date: 07/24/2022    ECHOCARDIOGRAM REPORT   Patient Name:   Curtis Bradford Date of Exam: 07/24/2022 Medical Rec #:  [id number]   Height:       74.0 in Accession #:    [id number]  Weight:       182.0 lb Date of Birth:  1960-10-01  BSA:          2.088 m Patient Age:    61 years    BP:           152/87 mmHg Patient Gender: M           HR:           85 bpm. Exam Location:  Inpatient Procedure: 2D Echo, Cardiac Doppler, Color Doppler and Intracardiac  Opacification Agent Indications:    stroke  History:        Patient has no prior history of Echocardiogram examinations.                 CAD; Risk Factors:Diabetes, Hypertension and Dyslipidemia.  Sonographer:    Melissa Morford RDCS (AE, PE) Referring Phys: [id number] TIMOTHY S OPYD IMPRESSIONS  1. Hypokinesis of the apical septum and apex. No evidence of thrombus on contrast imaging. Left ventricular ejection fraction, by estimation, is 50 to 55%. The left ventricle has low normal function. The left ventricle demonstrates regional wall motion abnormalities (see scoring diagram/findings for description). Indeterminate diastolic filling due to E-A  fusion.  2. Right ventricular systolic function is normal. The right ventricular size is normal. Tricuspid regurgitation signal is inadequate for assessing PA pressure.  3. The mitral valve is grossly normal. Trivial mitral valve regurgitation. No evidence of mitral stenosis.  4. The aortic valve is tricuspid. There is moderate calcification of the aortic valve. There is moderate thickening of the aortic valve. Aortic valve regurgitation is not visualized. Aortic valve sclerosis/calcification is present, without any evidence of aortic stenosis.  5. The inferior vena cava is normal in size with greater than 50% respiratory variability, suggesting right atrial pressure of 3 mmHg. Comparison(s): Prior images unable to be directly viewed, comparison made by report only. No significant change from prior study. Similar apical wall motion abnormality described on prior echo. FINDINGS  Left Ventricle: Hypokinesis of the apical septum and apex. No evidence of thrombus on contrast imaging. Left ventricular ejection fraction, by estimation, is 50 to 55%. The left ventricle has low normal function. The left ventricle demonstrates regional  wall motion abnormalities. Definity contrast agent was given IV to delineate the left ventricular endocardial borders. The left ventricular internal cavity size was normal in size. There is no left ventricular hypertrophy. Indeterminate diastolic filling due to E-A fusion.  LV Wall Scoring: The apical septal segment and apex are hypokinetic. Right Ventricle: The right ventricular size is normal. No increase in right ventricular wall thickness. Right ventricular systolic function is normal. Tricuspid regurgitation signal is inadequate for assessing PA pressure. Left Atrium: Left atrial size was normal in size. Right Atrium: Right atrial size was normal in size. Pericardium: There is no evidence of pericardial effusion. Mitral Valve: The mitral valve is grossly normal. Trivial mitral valve  regurgitation. No evidence of mitral valve stenosis. Tricuspid Valve: The tricuspid valve is grossly normal. Tricuspid valve regurgitation is not demonstrated. No evidence of tricuspid stenosis. Aortic Valve: The aortic valve is tricuspid. There is moderate calcification of the aortic valve. There is moderate thickening of the aortic valve. Aortic valve regurgitation is not visualized. Aortic valve sclerosis/calcification is present, without any  evidence of aortic stenosis. Pulmonic Valve: The pulmonic valve was grossly normal. Pulmonic valve regurgitation is not visualized. No evidence of pulmonic stenosis. Aorta: The aortic root is normal in size and structure. Venous: The inferior vena cava is normal in size with greater than 50% respiratory variability, suggesting right atrial pressure of 3 mmHg. IAS/Shunts: The atrial septum is grossly normal.  LEFT VENTRICLE PLAX 2D LVIDd:         3.90 cm      Diastology LVIDs:         3.20 cm      LV e' medial:    3.59 cm/s LV PW:         1.10 cm      LV E/e' medial:  1.0 LV IVS:        1.10 cm      LV e' lateral:   3.70 cm/s LVOT diam:     2.20 cm      LV E/e' lateral: 1.0 LV SV:         55 LV SV Index:   26 LVOT Area:     3.80 cm  LV Volumes (MOD) LV vol d, MOD A2C: 131.0 ml LV vol d, MOD A4C: 127.0 ml LV vol s, MOD A2C: 96.2 ml LV vol s, MOD A4C: 76.7 ml LV SV MOD A2C:     34.8 ml LV SV MOD A4C:     127.0 ml LV SV MOD BP:      44.7 ml RIGHT VENTRICLE RV S prime:     13.40 cm/s TAPSE (M-mode): 1.9 cm LEFT ATRIUM             Index        RIGHT ATRIUM           Index LA diam:        3.30 cm 1.58 cm/m   RA Area:     11.90 cm LA Vol (A2C):   46.3 ml 22.17 ml/m  RA Volume:   27.10 ml  12.98 ml/m LA Vol (A4C):   37.5 ml 17.96 ml/m LA Biplane Vol: 41.7 ml 19.97 ml/m  AORTIC VALVE LVOT Vmax:   72.50 cm/s LVOT Vmean:  54.000 cm/s LVOT VTI:    0.144 m  AORTA Ao Root diam: 2.90 cm MV E velocity: 3.59 cm/s                           SHUNTS                           Systemic VTI:   0.14 m                           Systemic Diam: 2.20 cm Eleonore Chiquito MD Electronically signed by Eleonore Chiquito MD Signature Date/Time: 07/24/2022/10:53:23 AM    Final    MR BRAIN WO CONTRAST  Result Date: 07/24/2022 CLINICAL DATA:  Acute neurologic deficit EXAM: MRI HEAD WITHOUT CONTRAST TECHNIQUE: Multiplanar, multiecho pulse sequences of the brain and surrounding structures were obtained without intravenous contrast. COMPARISON:  None Available. FINDINGS: Brain: Small focus of acute ischemia within the left pons. Old left occipital infarct. Approximately 5 scattered chronic microhemorrhages. 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 There is multifocal hyperintense T2-weighted signal within the white matter. Generalized volume loss. The midline structures are normal. Vascular: Normal flow voids. Skull and upper cervical spine: Normal marrow signal. Sinuses/Orbits: Negative. Other: None. IMPRESSION: 1. Small focus of acute ischemia within the left pons. No hemorrhage or mass effect. 2. Old left occipital infarct. Electronically Signed   By: Ulyses Jarred M.D.   On: 07/24/2022 02:04   CT ANGIO HEAD NECK W WO CM  Addendum Date: 07/23/2022   ADDENDUM REPORT: 07/23/2022 22:51 ADDENDUM: There is mild atherosclerotic disease at both carotid bifurcations without hemodynamically significant stenosis. Electronically Signed   By: Ulyses Jarred M.D.   On: 07/23/2022 22:51   Result Date: 07/23/2022 CLINICAL DATA:  Right face, arm and leg numbness EXAM: CT ANGIOGRAPHY HEAD AND NECK TECHNIQUE: Multidetector CT imaging of the head and neck was performed using the standard protocol during bolus administration  of intravenous contrast. Multiplanar CT image reconstructions and MIPs were obtained to evaluate the vascular anatomy. Carotid stenosis measurements (when applicable) are obtained utilizing NASCET criteria, using the distal internal carotid diameter as the denominator. RADIATION DOSE REDUCTION: This exam  was performed according to the departmental dose-optimization program which includes automated exposure control, adjustment of the mA and/or kV according to patient size and/or use of iterative reconstruction technique. CONTRAST:  21mL OMNIPAQUE IOHEXOL 350 MG/ML SOLN COMPARISON:  10/31/2017 FINDINGS: CT HEAD FINDINGS Brain: There is no mass, hemorrhage or extra-axial collection. There is generalized atrophy without lobar predilection. Old left parietotemporal infarct. There is hypoattenuation of the periventricular white matter, most commonly indicating chronic ischemic microangiopathy. Skull: The visualized skull base, calvarium and extracranial soft tissues are normal. Sinuses/Orbits: No fluid levels or advanced mucosal thickening of the visualized paranasal sinuses. No mastoid or middle ear effusion. The orbits are normal. CTA NECK FINDINGS SKELETON: There is no bony spinal canal stenosis. No lytic or blastic lesion. OTHER NECK: Normal pharynx, larynx and major salivary glands. No cervical lymphadenopathy. Unremarkable thyroid gland. UPPER CHEST: No pneumothorax or pleural effusion. No nodules or masses. AORTIC ARCH: There is calcific atherosclerosis of the aortic arch. There is no aneurysm, dissection or hemodynamically significant stenosis of the visualized portion of the aorta. Conventional 3 vessel aortic branching pattern. The visualized proximal subclavian arteries are widely patent. RIGHT CAROTID SYSTEM: Normal without aneurysm, dissection or stenosis. LEFT CAROTID SYSTEM: Normal without aneurysm, dissection or stenosis. VERTEBRAL ARTERIES: Left dominant configuration. Both origins are clearly patent. There is no dissection, occlusion or flow-limiting stenosis to the skull base (V1-V3 segments). CTA HEAD FINDINGS POSTERIOR CIRCULATION: --Vertebral arteries: Normal V4 segments. --Inferior cerebellar arteries: Normal. --Basilar artery: Normal. --Superior cerebellar arteries: Normal. --Posterior cerebral  arteries (PCA): Normal. ANTERIOR CIRCULATION: --Intracranial internal carotid arteries: Atherosclerotic calcification of the internal carotid arteries at the skull base without hemodynamically significant stenosis. --Anterior cerebral arteries (ACA): Normal. Both A1 segments are present. Patent anterior communicating artery (a-comm). --Middle cerebral arteries (MCA): Normal. VENOUS SINUSES: As permitted by contrast timing, patent. ANATOMIC VARIANTS: None Review of the MIP images confirms the above findings. IMPRESSION: 1. No emergent large vessel occlusion or hemodynamically significant stenosis of the head or neck. 2. 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 Aortic atherosclerosis (ICD10-I70.0). Electronically Signed: By: Ulyses Jarred M.D. On: 07/23/2022 22:40    Pending Labs Unresulted Labs (From admission, onward)     Start     Ordered   07/31/22 0500  Creatinine, serum  (enoxaparin (LOVENOX)    CrCl >/= 30 ml/min)  Weekly,   R     Comments: while on enoxaparin therapy    07/24/22 0242   07/25/22 0500  CBC with Differential/Platelet  Tomorrow morning,   R        07/24/22 0959   07/25/22 1017  Basic metabolic panel  Tomorrow morning,   R        07/24/22 0959   07/25/22 0500  Magnesium  Tomorrow morning,   R        07/24/22 0959            Vitals/Pain Today's Vitals   07/24/22 1042 07/24/22 1043 07/24/22 1045 07/24/22 1300  BP:   117/82   Pulse: 89 91 89   Resp: (!) 23 19 17    Temp:    98.7 F (37.1 C)  TempSrc:    Oral  SpO2: 98% 97% 99%   Weight:      Height:  PainSc:        Isolation Precautions No active isolations  Medications Medications  aspirin EC tablet 81 mg (81 mg Oral Given 07/24/22 1041)  atorvastatin (LIPITOR) tablet 20 mg (20 mg Oral Given 07/24/22 1041)  tamsulosin (FLOMAX) capsule 0.4 mg (0.4 mg Oral Patient Refused/Not Given 07/24/22 1042)   stroke: early stages of recovery book (has no administration in time range)   acetaminophen (TYLENOL) tablet 650 mg (has no administration in time range)    Or  acetaminophen (TYLENOL) 160 MG/5ML solution 650 mg (has no administration in time range)    Or  acetaminophen (TYLENOL) suppository 650 mg (has no administration in time range)  senna-docusate (Senokot-S) tablet 1 tablet (has no administration in time range)  enoxaparin (LOVENOX) injection 40 mg (40 mg Subcutaneous Patient Refused/Not Given 07/24/22 1042)  insulin aspart (novoLOG) injection 0-6 Units (1 Units Subcutaneous Patient Refused/Not Given 07/24/22 1147)  insulin aspart (novoLOG) injection 0-5 Units (has no administration in time range)  insulin glargine-yfgn (SEMGLEE) injection 10 Units (10 Units Subcutaneous Given 07/24/22 1043)  clopidogrel (PLAVIX) tablet 75 mg (75 mg Oral Given 07/24/22 0500)  0.9 %  sodium chloride infusion ( Intravenous New Bag/Given 07/24/22 0737)  perflutren lipid microspheres (DEFINITY) IV suspension (2 mLs Intravenous Given 07/24/22 1032)  ondansetron (ZOFRAN) injection 4 mg (4 mg Intravenous Given 07/24/22 1256)  iohexol (OMNIPAQUE) 350 MG/ML injection 75 mL (75 mLs Intravenous Contrast Given 07/23/22 2207)  ondansetron (ZOFRAN) injection 4 mg (4 mg Intravenous Given 07/24/22 1301)    Mobility walks     Focused Assessments Neuro Assessment Handoff:  Swallow screen pass? Yes    NIH Stroke Scale  Dizziness Present: No Headache Present: Yes Interval: Shift assessment Level of Consciousness (1a.)   : Alert, keenly responsive LOC Questions (1b. )   : Answers both questions correctly LOC Commands (1c. )   : Performs both tasks correctly Best Gaze (2. )  : Normal Visual (3. )  : No visual loss Facial Palsy (4. )    : Normal symmetrical movements Motor Arm, Left (5a. )   : No drift Motor Arm, Right (5b. ) : No drift Motor Leg, Left (6a. )  : No drift Motor Leg, Right (6b. ) : No drift Limb Ataxia (7. ): Absent Sensory (8. )  : Mild-to-moderate sensory loss, patient feels  pinprick is less sharp or is dull on the affected side, or there is a loss of superficial pain with pinprick, but patient is aware of being touched Best Language (9. )  : No aphasia Dysarthria (10. ): Normal Extinction/Inattention (11.)   : No Abnormality Complete NIHSS TOTAL: 1     Neuro Assessment: Exceptions to WDL Neuro Checks:   Initial (07/23/22 2105)  Has TPA been given? No If patient is a Neuro Trauma and patient is going to OR before floor call report to 4N Charge nurse: [phone] or [phone]   R Recommendations: See Admitting Provider Note  Report given to:   Additional Notes:  Aox4, ambulated well, here for stroke, right sided decreased sensation, passed swallow, ambulated well, R BKA with prosthetic, experiencing N/V despite PRN, MD aware an waiting new orders.

## 2022-07-24 NOTE — H&P (Signed)
History and Physical    Curtis Bradford GEX:[id number] DOB: 09-Jul-1960 DOA: 07/23/2022  PCP: Sueanne Margarita, DO   Patient coming from: Home   Chief Complaint: Right side numbness, balance difficulty   HPI: Curtis Bradford is a pleasant 62 y.o. male with medical history significant for insulin-dependent diabetes mellitus, hyperlipidemia, CKD 3A, CAD, and history of CVA who presents to the emergency department with right-sided numbness.  Patient reports that he woke in his usual state of health but noticed numbness involving his right face, right upper extremity, and right lower extremity at approximately 2 or 3 PM.  He initially suspected this was from sleeping on his right side, but symptoms worsened and eventually prompted his presentation to the ED.  He denies any recent chest pain or palpitations, denies recent fever or chills, and denies lightheadedness or vertigo.  He reports having some mild ongoing word finding difficulty ever since a remote stroke.  [facility] ED Course: Upon arrival to the ED, patient is found to be afebrile and saturating well on room air with normal heart rate and stable blood pressure.  Head CT was notable for old infarcts.  CTA head and neck was negative for emergent large vessel occlusion or hemodynamically significant stenosis.  Labs were most notable for creatinine 1.50 and WBC 11,400.  Neurology advised transfer to [facility] for MRI which demonstrates small focus of acute ischemia within the left pons.  Review of Systems:  All other systems reviewed and apart from HPI, are negative.  Past Medical History:  Diagnosis Date   CAD (coronary artery disease)    CKD (chronic kidney disease), stage III ([place])    Diabetes mellitus without complication ([place])    Type II   History of DVT (deep vein thrombosis)    History of kidney stones    passed stones - no surgery   HLD (hyperlipidemia)    Myocardial infarction [facility]) 2009   Peripheral vascular disease ([place])    right leg    Seizure ([place])    last one 2015   Stroke ([place]) 2012, 10/2017   2012-speech was effected- has come back.10/2017- numbness of right side no taste right  side. 2012-   Tobacco abuse     Past Surgical History:  Procedure Laterality Date   AMPUTATION Right 02/23/2017   Procedure: RIGHT FOOT 5TH RAY AMPUTATION;  Surgeon: Newt Minion, MD;  Location: [place];  Service: Orthopedics;  Laterality: Right;   AMPUTATION Right 04/23/2018   Procedure: Right Great Toe Amputation;  Surgeon: Newt Minion, MD;  Location: [place];  Service: Orthopedics;  Laterality: Right;   AMPUTATION Right 07/23/2018   Procedure: RIGHT TRANSMETATARSAL AMPUTATION, APPLY WOUND VAC;  Surgeon: Newt Minion, MD;  Location: [place];  Service: Orthopedics;  Laterality: Right;   AMPUTATION Right 09/17/2018   Procedure: RIGHT AMPUTATION BELOW KNEE;  Surgeon: Newt Minion, MD;  Location: [place];  Service: Orthopedics;  Laterality: Right;   APPLICATION OF WOUND VAC Right 09/17/2018   Procedure: Application Of Wound Vac;  Surgeon: Newt Minion, MD;  Location: [place];  Service: Orthopedics;  Laterality: Right;   BELOW KNEE LEG AMPUTATION Right 09/17/2018   CORONARY ANGIOPLASTY  03/05/2008   stent   FOOT SURGERY     PERIPHERAL ARTERIAL STENT GRAFT Right 2014   leg 2 stents- clotted per pat   STENT PLACEMENT VASCULAR ([place] HX)     Several in right leg   TONSILLECTOMY     TRANSMETATARSAL AMPUTATION Right 07/23/2018  Social History:   reports that he has been smoking cigarettes. He has a 3.75 pack-year smoking history. He has never used smokeless tobacco. He reports that he does not currently use alcohol. He reports that he does not use drugs.  Allergies  Allergen Reactions   Metformin     Other reaction(s): seizure, ARF    Family History  Problem Relation Age of Onset   Diabetes Mellitus II Mother    Diabetes Mellitus II Father    Colon cancer Neg Hx    Esophageal cancer Neg Hx      Prior to Admission medications    Medication Sig Start Date End Date Taking? Authorizing Provider  acetaminophen (TYLENOL) 325 MG tablet Take 1-2 tablets (325-650 mg total) by mouth every 6 (six) hours as needed for mild pain (pain score 1-3 or temp > 100.5). 10/02/18   Angiulli, Mcarthur Rossetti, PA-C  aspirin EC 81 MG tablet Take 81 mg by mouth daily.     [provider]  atorvastatin (LIPITOR) 20 MG tablet TAKE 1 TABLET BY MOUTH EVERY DAY 03/31/20   Ihor Austin, NP  Cholecalciferol (VITAMIN D3) 125 MCG (5000 UT) CAPS 1 capsule 10/26/20   [provider]  clopidogrel (PLAVIX) 75 MG tablet Take 1 tablet (75 mg total) by mouth at bedtime. 12/15/19   Ihor Austin, NP  Continuous Blood Gluc Sensor (FREESTYLE LIBRE 14 DAY SENSOR) MISC Place 1 patch onto the skin every 14 (fourteen) days. 06/26/18   [name], MD  Continuous Blood Gluc Transmit (DEXCOM G6 TRANSMITTER) MISC See admin instructions. 08/11/20   [provider]  ezetimibe (ZETIA) 10 MG tablet TAKE 1 TABLET BY MOUTH EVERY DAY 03/31/20   Ihor Austin, NP  HUMALOG KWIKPEN 100 UNIT/ML KwikPen Inject into the skin. 07/21/20   [provider]  Insulin Glargine (LANTUS) 100 UNIT/ML Solostar Pen Inject 27 Units into the skin at bedtime. Patient taking differently: Inject 37 Units into the skin at bedtime. 10/02/18   Angiulli, Mcarthur Rossetti, PA-C  linagliptin (TRADJENTA) 5 MG TABS tablet Take 1 tablet (5 mg total) by mouth daily. 10/02/18   Angiulli, Mcarthur Rossetti, PA-C  methocarbamol (ROBAXIN) 500 MG tablet Take 1 tablet (500 mg total) by mouth 2 (two) times daily as needed. 03/28/21   Cristie Hem, PA-C  predniSONE (STERAPRED UNI-PAK 21 TAB) 5 MG (21) TBPK tablet Take as directed 03/28/21   Cristie Hem, PA-C  tamsulosin (FLOMAX) 0.4 MG CAPS capsule Take 0.4 mg by mouth daily. 07/21/20   [provider]  vancomycin (VANCOCIN) 125 MG capsule Take 1 capsule (125 mg total) by mouth as directed. Take 1 capsule by mouth four times daily for 2 weeks.  Take 1  capsule by mouth twice daily for 1 week.  Take 1 capsule by mouth once daily for 1 week. Then take 1 capsule by mouth every other day for 6 weeks. Then Stop. 11/24/18   Shellia Cleverly, DO    Physical Exam: Vitals:   07/23/22 2345 07/24/22 0030 07/24/22 0100 07/24/22 0211  BP: (!) 152/87 (!) 160/91 120/72 (!) 161/93  Pulse: 87 82 84 87  Resp: 18 17 16 15   Temp: 97.9 F (36.6 C) 97.8 F (36.6 C)    TempSrc: Oral Oral    SpO2: 99% 100% 98% 99%  Weight:  82.6 kg    Height:  6\' 2"  (1.88 m)      Constitutional: NAD, calm  Eyes: PERTLA, lids and conjunctivae normal ENMT: Mucous membranes are moist.  Posterior pharynx clear of any exudate or lesions.   Neck: supple, no masses  Respiratory: no wheezing, no crackles. No accessory muscle use.  Cardiovascular: S1 & S2 heard, regular rate and rhythm. No extremity edema.   Abdomen: No distension, no tenderness, soft. Bowel sounds active.  Musculoskeletal: no clubbing / cyanosis. S/p right BKA.   Skin: no significant rashes, lesions, ulcers. Warm, dry, well-perfused. Neurologic: CN 2-12 grossly intact. Sensation to light touch diminished in right lower face, RUE, and RLE. Strength 5/5 in all 4 limbs. Alert and oriented.  Psychiatric: Pleasant. Cooperative.    Labs and Imaging on Admission: I have personally reviewed following labs and imaging studies  CBC: Recent Labs  Lab 07/23/22 2127  WBC 11.4*  NEUTROABS 7.2  HGB 16.7  HCT 49.2  MCV 90.1  PLT 093   Basic Metabolic Panel: Recent Labs  Lab 07/23/22 2127  NA 133*  K 4.3  CL 102  CO2 22  GLUCOSE 147*  BUN 19  CREATININE 1.50*  CALCIUM 8.7*   GFR: Estimated Creatinine Clearance: 60.9 mL/min (A) (by C-G formula based on SCr of 1.5 mg/dL (H)). Liver Function Tests: Recent Labs  Lab 07/23/22 2127  AST 15  ALT 19  ALKPHOS 84  BILITOT 0.5  PROT 7.8  ALBUMIN 4.3   No results for input(s): "LIPASE", "AMYLASE" in the last 168 hours. No results for input(s):  "AMMONIA" in the last 168 hours. Coagulation Profile: No results for input(s): "INR", "PROTIME" in the last 168 hours. Cardiac Enzymes: No results for input(s): "CKTOTAL", "CKMB", "CKMBINDEX", "TROPONINI" in the last 168 hours. BNP (last 3 results) No results for input(s): "PROBNP" in the last 8760 hours. HbA1C: No results for input(s): "HGBA1C" in the last 72 hours. CBG: Recent Labs  Lab 07/23/22 2109  GLUCAP 138*   Lipid Profile: No results for input(s): "CHOL", "HDL", "LDLCALC", "TRIG", "CHOLHDL", "LDLDIRECT" in the last 72 hours. Thyroid Function Tests: No results for input(s): "TSH", "T4TOTAL", "FREET4", "T3FREE", "THYROIDAB" in the last 72 hours. Anemia Panel: No results for input(s): "VITAMINB12", "FOLATE", "FERRITIN", "TIBC", "IRON", "RETICCTPCT" in the last 72 hours. Urine analysis:    Component Value Date/Time   COLORURINE YELLOW 03/09/2019 0148   APPEARANCEUR CLOUDY (A) 03/09/2019 0148   LABSPEC >1.030 (H) 03/09/2019 0148   PHURINE 5.5 03/09/2019 0148   GLUCOSEU NEGATIVE 03/09/2019 0148   HGBUR MODERATE (A) 03/09/2019 0148   BILIRUBINUR NEGATIVE 03/09/2019 0148   KETONESUR NEGATIVE 03/09/2019 0148   PROTEINUR 100 (A) 03/09/2019 0148   NITRITE POSITIVE (A) 03/09/2019 0148   LEUKOCYTESUR SMALL (A) 03/09/2019 0148   Sepsis Labs: @LABRCNTIP (procalcitonin:4,lacticidven:4) )No results found for this or any previous visit (from the past 240 hour(s)).   Radiological Exams on Admission: MR BRAIN WO CONTRAST  Result Date: 07/24/2022 CLINICAL DATA:  Acute neurologic deficit EXAM: MRI HEAD WITHOUT CONTRAST TECHNIQUE: Multiplanar, multiecho pulse sequences of the brain and surrounding structures were obtained without intravenous contrast. COMPARISON:  None Available. FINDINGS: Brain: Small focus of acute ischemia within the left pons. Old left occipital infarct. Approximately 5 scattered chronic microhemorrhages. 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 There is  multifocal hyperintense T2-weighted signal within the white matter. Generalized volume loss. The midline structures are normal. Vascular: Normal flow voids. Skull and upper cervical spine: Normal marrow signal. Sinuses/Orbits: Negative. Other: None. IMPRESSION: 1. Small focus of acute ischemia within the left pons. No hemorrhage or mass effect. 2. Old left occipital infarct. Electronically Signed   By: Cletus Gash.D.  On: 07/24/2022 02:04   CT ANGIO HEAD NECK W WO CM  Addendum Date: 07/23/2022   ADDENDUM REPORT: 07/23/2022 22:51 ADDENDUM: There is mild atherosclerotic disease at both carotid bifurcations without hemodynamically significant stenosis. Electronically Signed   By: Ulyses Jarred M.D.   On: 07/23/2022 22:51   Result Date: 07/23/2022 CLINICAL DATA:  Right face, arm and leg numbness EXAM: CT ANGIOGRAPHY HEAD AND NECK TECHNIQUE: Multidetector CT imaging of the head and neck was performed using the standard protocol during bolus administration of intravenous contrast. Multiplanar CT image reconstructions and MIPs were obtained to evaluate the vascular anatomy. Carotid stenosis measurements (when applicable) are obtained utilizing NASCET criteria, using the distal internal carotid diameter as the denominator. RADIATION DOSE REDUCTION: This exam was performed according to the departmental dose-optimization program which includes automated exposure control, adjustment of the mA and/or kV according to patient size and/or use of iterative reconstruction technique. CONTRAST:  62mL OMNIPAQUE IOHEXOL 350 MG/ML SOLN COMPARISON:  10/31/2017 FINDINGS: CT HEAD FINDINGS Brain: There is no mass, hemorrhage or extra-axial collection. There is generalized atrophy without lobar predilection. Old left parietotemporal infarct. There is hypoattenuation of the periventricular white matter, most commonly indicating chronic ischemic microangiopathy. Skull: The visualized skull base, calvarium and extracranial soft  tissues are normal. Sinuses/Orbits: No fluid levels or advanced mucosal thickening of the visualized paranasal sinuses. No mastoid or middle ear effusion. The orbits are normal. CTA NECK FINDINGS SKELETON: There is no bony spinal canal stenosis. No lytic or blastic lesion. OTHER NECK: Normal pharynx, larynx and major salivary glands. No cervical lymphadenopathy. Unremarkable thyroid gland. UPPER CHEST: No pneumothorax or pleural effusion. No nodules or masses. AORTIC ARCH: There is calcific atherosclerosis of the aortic arch. There is no aneurysm, dissection or hemodynamically significant stenosis of the visualized portion of the aorta. Conventional 3 vessel aortic branching pattern. The visualized proximal subclavian arteries are widely patent. RIGHT CAROTID SYSTEM: Normal without aneurysm, dissection or stenosis. LEFT CAROTID SYSTEM: Normal without aneurysm, dissection or stenosis. VERTEBRAL ARTERIES: Left dominant configuration. Both origins are clearly patent. There is no dissection, occlusion or flow-limiting stenosis to the skull base (V1-V3 segments). CTA HEAD FINDINGS POSTERIOR CIRCULATION: --Vertebral arteries: Normal V4 segments. --Inferior cerebellar arteries: Normal. --Basilar artery: Normal. --Superior cerebellar arteries: Normal. --Posterior cerebral arteries (PCA): Normal. ANTERIOR CIRCULATION: --Intracranial internal carotid arteries: Atherosclerotic calcification of the internal carotid arteries at the skull base without hemodynamically significant stenosis. --Anterior cerebral arteries (ACA): Normal. Both A1 segments are present. Patent anterior communicating artery (a-comm). --Middle cerebral arteries (MCA): Normal. VENOUS SINUSES: As permitted by contrast timing, patent. ANATOMIC VARIANTS: None Review of the MIP images confirms the above findings. IMPRESSION: 1. No emergent large vessel occlusion or hemodynamically significant stenosis of the head or neck. 2. Old left parietotemporal infarct and  chronic ischemic microangiopathy. Aortic atherosclerosis (ICD10-I70.0). Electronically Signed: By: Ulyses Jarred M.D. On: 07/23/2022 22:40    EKG: Independently reviewed. Sinus rhythm, LVH, LAD, repolarization abnormality.    Assessment/Plan   1. Acute ischemic stroke  - Presents with right-sided numbness and found on MRI to have small acute infarct in left pons - Appreciate neurology consultation  - Continue cardiac monitoring and frequent neuro checks, check echocardiogram, lipids, and A1c, keep NPO pending swallow screen, consult SLP/PT/OT, continue Lipitor, load with 300 mg Plavix and then continue Plavix 75 qD and ASA 81 qD     2. Type II DM  - A1c was 7.5% in March 2020  - Continue CBG checks and insulin  3. CKD IIIa  - Appears close to baseline  - Renally-dose medications, monitor    4. CAD  - No anginal complaints  - Continue statin and antiplatelets     DVT prophylaxis: Lovenox  Code Status: Full  Level of Care: Level of care: Telemetry Medical Family Communication: None present   Disposition Plan:  [name] from: Home  Anticipated d/c is to: TBD Anticipated d/c date is: 1/31 or 07/26/22  Patient currently: Pending CVA workup  Consults called: neurology  Admission status: Observation     Vianne Bulls, MD [facility]  07/24/2022, 2:42 AM

## 2022-07-24 NOTE — Progress Notes (Signed)
[place] STUDY NOTE   [name] participating in  [place] stroke prevention study ( standard of care antiplatelet therapy plus Milvexian-factor 11 inhibitor versus standard of care antiplatelet therapy plus placebo ).  Patient was given study material to review and informed consent form at 11:00 AM.  Patient was advised to take as much time as he wanted to review the consent form and encouraged to ask questions which were answered.  Patient's family was not present at the bedside   It was made clear to the patient that study participation is voluntary and patient will still get the same excellent standard of care irrespective of whether he chooses to participate in the study or not.  The benefits of participation in the study as well as the risk involved including but not limited to bleeding as well as alternatives to not participating in the study were clearly discussed with the patient  who expressed understanding.  The patient was clearly informed that patient has no obligation to's stay in the study for the entire duration and is free to discontinue study medication or study participation if he is dissatisfied at any time in the future.  The research study office visit scheduled was explained to the patient and study obligations were clearly explained as well.  Patient voiced understanding and willingness to participate in the study.  The study inclusion exclusion criteria reviewed by me personally as well as study coordinator and verified that patient met all criteria.  Patient signed informed consent with study coordinator in my presence and 3: 40 PM.  The hospital research pharmacy was notified in advance about potential patient participation and after patient randomization and patient received first dose of medication before discharge and she was discharged home with the study medication bottle and instructed to call [place] research office with any questions.  No study specific procedure  was done prior to patient signing informed consent form.  A copy of informed consent form and patient [place] of Rights signed by the patient was given to him.Antony Contras, MD

## 2022-07-24 NOTE — ED Notes (Signed)
Pt returns from ECHO

## 2022-07-24 NOTE — Evaluation (Signed)
Occupational Therapy Evaluation Patient Details Name: Curtis Bradford MRN: [id number] DOB: [birth year] Today's Date: 07/24/2022   History of Present Illness [name] a 62 y.o. male presenting with right sided weakness, numbness, and a stumbling gait. MRI revealed small focus of acute ischemia within the left pons, no hemorrhage or mass effect. 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   Clinical Impression   PTA, pt lives alone and typically completely independent in all daily tasks, including full time work. Pt presents now with R sided numbness, minor R UE weakness, questionable R inattention (to be further assessed), and poor safety awareness. Pt reported preference to ambulate without AD though required consistent Min A to maintain balance with noted bumping into items on R side. Pt later trialed RW with PT w/ apparent improved stability. Pt requires Setup for UB ADLs and Min A for LB ADLs. Initiated discussion of task modification during daily routine to maximize safety and decrease fall risk. Rec OP OT pending pt availability for transportation vs clearance for driving from [place].      Recommendations for follow up therapy are one component of a multi-disciplinary discharge planning process, led by the attending physician.  Recommendations may be updated based on patient status, additional functional criteria and insurance authorization.   Follow Up Recommendations  Outpatient OT     Assistance Recommended at Discharge Intermittent Supervision/Assistance  Patient can return home with the following Assistance with cooking/housework;Direct supervision/assist for medications management;Assist for transportation    Functional Status Assessment  Patient has had a recent decline in their  functional status and demonstrates the ability to make significant improvements in function in a reasonable and predictable amount of time.  Equipment Recommendations  None recommended by OT    Recommendations for Other Services       Precautions / Restrictions Precautions Precautions: Fall Restrictions Weight Bearing Restrictions: No      Mobility Bed Mobility               General bed mobility comments: EOB on entry    Transfers Overall transfer level: Needs assistance Equipment used: None Transfers: Sit to/from Stand Sit to Stand: Min guard           General transfer comment: to stand from stretcher without AD      Balance Overall balance assessment: Needs assistance Sitting-balance support: No upper extremity supported, Feet supported Sitting balance-Leahy Scale: Fair     Standing balance support: No upper extremity supported, During functional activity Standing balance-Leahy Scale: Fair Standing balance comment: can stand without assist fairly well, able to ambulate unsteadily without AD, bumping into items on R side, holding to rail at times. initially declined need for RW but later did trial with PT and improved stability                           ADL either performed or assessed with clinical judgement   ADL Overall ADL's : Needs assistance/impaired Eating/Feeding: Modified independent   Grooming: Supervision/safety;Standing   Upper Body Bathing: Set up;Sitting   Lower Body Bathing: Minimal assistance;Sit to/from stand   Upper Body Dressing : Set up;Sitting   Lower Body Dressing: Minimal assistance;Sit to/from stand   Toilet Transfer: Minimal Print production planner Details (indicate cue  type and reason): without AD Toileting- Clothing Manipulation and Hygiene: Supervision/safety;Sitting/lateral lean;Sit to/from stand Toileting - Clothing Manipulation Details (indicate cue type and reason): per pt and nursing, pt has been  managing toileting tasks without assist       General ADL Comments: Noted unsteadiness and inattention to R side with mobility, requires Min A when not using AD but appeared improved when observed ambulating with PT in hall using RW. Pt able to volunteer task modificiation at home such as using [employer] rather than going out grocery shopping as needed     Vision Baseline Vision/History: 1 Wears glasses Ability to See in Adequate Light: 2 Moderately impaired Patient Visual Report: Other (comment) (reports sensitivity to bright lights when driving at night, some blurriness of L eye but reports may not be associated with stroke symptoms) Vision Assessment?: Vision impaired- to be further tested in functional context Additional Comments: question R visual field inattention as pt bumping into items on R side in hallway.     Perception Perception Perception Tested?: Yes Perception Deficits: Inattention/neglect Inattention/Neglect: Impaired- to be further tested in functional context Comments: bumping into hand sanitizer on R side, almost bumping into staff on R side in hallway   Praxis      Pertinent Vitals/Pain Pain Assessment Pain Assessment: No/denies pain     Hand Dominance Right   Extremity/Trunk Assessment Upper Extremity Assessment Upper Extremity Assessment: RUE deficits/detail RUE Deficits / Details: reports complete numbness, dropping items if not looking at what he is holding. strength slightly impaired 4-/5 RUE Sensation: decreased light touch RUE Coordination: WNL   Lower Extremity Assessment Lower Extremity Assessment: Defer to PT evaluation   Cervical / Trunk Assessment Cervical / Trunk Assessment: Normal   Communication Communication Communication: No difficulties   Cognition Arousal/Alertness: Awake/alert Behavior During Therapy: Flat affect, Impulsive Overall Cognitive Status: Impaired/Different from baseline Area of Impairment: Safety/judgement, Awareness,  Attention                   Current Attention Level: Selective     Safety/Judgement: Decreased awareness of deficits, Decreased awareness of safety Awareness: Emergent   General Comments: poor insight into safety and deficits, declined DME need for AD despite obvious balance deficits (later agreeable to try DME), flat affect     General Comments       Exercises     Shoulder Instructions      Home Living Family/patient expects to be discharged to:: Private residence Living Arrangements: Alone Available Help at Discharge: Family;Friend(s);Available PRN/intermittently ("if i really had to" have some help) Type of Home: Apartment Home Access: Level entry     Home Layout: One level     Bathroom Shower/Tub: Teacher, early years/pre: Standard     Home Equipment: Conservation officer, nature (2 wheels);Tub bench          Prior Functioning/Environment Prior Level of Function : Independent/Modified Independent;Driving;Working/employed             Mobility Comments: no use of AD typically ADLs Comments: Independent with ADLs, [place]AD[place]        OT Problem List: Decreased strength;Decreased activity tolerance;Impaired balance (sitting and/or standing);Decreased safety awareness;Decreased knowledge of use of DME or AE;Decreased cognition;Impaired vision/perception;Impaired sensation;Impaired UE functional use      OT Treatment/Interventions: Therapeutic exercise;Self-care/ADL training;Energy conservation;DME and/or AE instruction;Therapeutic activities;Patient/family education    OT Goals(Current goals can be found in the care plan section) Acute Rehab OT Goals Patient Stated Goal: home soon, improve R  sided sensation OT Goal Formulation: With patient Time For Goal Achievement: 08/07/22 Potential to Achieve Goals: Good  OT Frequency: Min 2X/week    Co-evaluation              AM-PAC OT "6 Clicks" Daily Activity     Outcome Measure Help  from another person eating meals?: None Help from another person taking care of personal grooming?: A Little Help from another person toileting, which includes using toliet, bedpan, or urinal?: A Little Help from another person bathing (including washing, rinsing, drying)?: A Little Help from another person to put on and taking off regular upper body clothing?: A Little Help from another person to put on and taking off regular lower body clothing?: A Little 6 Click Score: 19   End of Session Equipment Utilized During Treatment: Gait belt Nurse Communication: Mobility status  Activity Tolerance: Patient tolerated treatment well Patient left: Other (comment) (to ambulate in hall with PT)  OT Visit Diagnosis: Unsteadiness on feet (R26.81);Other abnormalities of gait and mobility (R26.89);Muscle weakness (generalized) (M62.81)                Time: 4008-6761 OT Time Calculation (min): 14 min Charges:  OT General Charges $OT Visit: 1 Visit OT Evaluation $OT Eval Moderate Complexity: 1 Mod  Malachy Chamber, OTR/L Acute Rehab Services Office: [phone]   Layla Maw 07/24/2022, 12:08 PM

## 2022-07-24 NOTE — ED Notes (Signed)
Pt transported to Echo.  ?

## 2022-07-24 NOTE — ED Triage Notes (Signed)
Pt via [facility] from [employer] [facility] c/o right-sided numbness since around 2pm today. Hx previous strokes x 2 with no residual deficits per pt report. Pt noted to be unsteady on feet and states that he feels like his equilibrium mis "off" tonight. No other neuro symptoms noted or reported at time of arrival.

## 2022-07-24 NOTE — Progress Notes (Addendum)
STROKE TEAM PROGRESS NOTE   INTERVAL HISTORY No family is present at bedside. Endorses significant improvement in diplopia since hospital arrival without residual double vision reported. Neuro exam is stable with ongoing decreased sensation to the right face, arm, and leg. Patient reports no improvement in sensation since onset.   MRI brain with small focus of acute ischemia in the left pons.  2D echo EF of 50 to 55% with akinesis of the septum and apex with no clot.  Left atrium size is normal. Vitals:   07/24/22 0615 07/24/22 0630 07/24/22 0645 07/24/22 0700  BP:    128/86  Pulse: 87 87 91 90  Resp:  16 20 17   Temp:      TempSrc:      SpO2: 96% 98% 98% 95%  Weight:      Height:       CBC:  Recent Labs  Lab 07/23/22 2127 07/24/22 0442  WBC 11.4* 12.0*  NEUTROABS 7.2  --   HGB 16.7 15.1  HCT 49.2 44.2  MCV 90.1 91.9  PLT 301 308   Basic Metabolic Panel:  Recent Labs  Lab 07/23/22 2127 07/24/22 0442  NA 133* 135  K 4.3 4.0  CL 102 103  CO2 22 21*  GLUCOSE 147* 131*  BUN 19 24*  CREATININE 1.50* 1.88*  CALCIUM 8.7* 8.6*   Lipid Panel:  Recent Labs  Lab 07/24/22 0442  CHOL 89  TRIG 212*  HDL 26*  CHOLHDL 3.4  VLDL 42*  LDLCALC 21   HgbA1c:  Recent Labs  Lab 07/24/22 0442  HGBA1C 6.9*   Urine Drug Screen: No results for input(s): "LABOPIA", "COCAINSCRNUR", "LABBENZ", "AMPHETMU", "THCU", "LABBARB" in the last 168 hours.  Alcohol Level No results for input(s): "ETH" in the last 168 hours.  IMAGING past 24 hours MR BRAIN WO CONTRAST  Result Date: 07/24/2022 CLINICAL DATA:  Acute neurologic deficit EXAM: MRI HEAD WITHOUT CONTRAST TECHNIQUE: Multiplanar, multiecho pulse sequences of the brain and surrounding structures were obtained without intravenous contrast. COMPARISON:  None Available. FINDINGS: Brain: Small focus of acute ischemia within the left pons. Old left occipital infarct. Approximately 5 scattered chronic microhemorrhages. 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 There is multifocal hyperintense T2-weighted signal within the white matter. Generalized volume loss. The midline structures are normal. Vascular: Normal flow voids. Skull and upper cervical spine: Normal marrow signal. Sinuses/Orbits: Negative. Other: None. IMPRESSION: 1. Small focus of acute ischemia within the left pons. No hemorrhage or mass effect. 2. Old left occipital infarct. Electronically Signed   By: Ulyses Jarred M.D.   On: 07/24/2022 02:04   CT ANGIO HEAD NECK W WO CM  Addendum Date: 07/23/2022   ADDENDUM REPORT: 07/23/2022 22:51 ADDENDUM: There is mild atherosclerotic disease at both carotid bifurcations without hemodynamically significant stenosis. Electronically Signed   By: Ulyses Jarred M.D.   On: 07/23/2022 22:51   Result Date: 07/23/2022 CLINICAL DATA:  Right face, arm and leg numbness EXAM: CT ANGIOGRAPHY HEAD AND NECK TECHNIQUE: Multidetector CT imaging of the head and neck was performed using the standard protocol during bolus administration of intravenous contrast. Multiplanar CT image reconstructions and MIPs were obtained to evaluate the vascular anatomy. Carotid stenosis measurements (when applicable) are obtained utilizing NASCET criteria, using the distal internal carotid diameter as the denominator. RADIATION DOSE REDUCTION: This exam was performed according to the departmental dose-optimization program which includes automated exposure control, adjustment of the mA and/or kV according to patient size and/or use of iterative  reconstruction technique. CONTRAST:  76mL OMNIPAQUE IOHEXOL 350 MG/ML SOLN COMPARISON:  10/31/2017 FINDINGS: CT HEAD FINDINGS Brain: There is no mass, hemorrhage or extra-axial collection. There is generalized atrophy without lobar predilection. Old left parietotemporal infarct. There is hypoattenuation of the periventricular white matter, most commonly indicating chronic ischemic microangiopathy. Skull: The visualized skull base,  calvarium and extracranial soft tissues are normal. Sinuses/Orbits: No fluid levels or advanced mucosal thickening of the visualized paranasal sinuses. No mastoid or middle ear effusion. The orbits are normal. CTA NECK FINDINGS SKELETON: There is no bony spinal canal stenosis. No lytic or blastic lesion. OTHER NECK: Normal pharynx, larynx and major salivary glands. No cervical lymphadenopathy. Unremarkable thyroid gland. UPPER CHEST: No pneumothorax or pleural effusion. No nodules or masses. AORTIC ARCH: There is calcific atherosclerosis of the aortic arch. There is no aneurysm, dissection or hemodynamically significant stenosis of the visualized portion of the aorta. Conventional 3 vessel aortic branching pattern. The visualized proximal subclavian arteries are widely patent. RIGHT CAROTID SYSTEM: Normal without aneurysm, dissection or stenosis. LEFT CAROTID SYSTEM: Normal without aneurysm, dissection or stenosis. VERTEBRAL ARTERIES: Left dominant configuration. Both origins are clearly patent. There is no dissection, occlusion or flow-limiting stenosis to the skull base (V1-V3 segments). CTA HEAD FINDINGS POSTERIOR CIRCULATION: --Vertebral arteries: Normal V4 segments. --Inferior cerebellar arteries: Normal. --Basilar artery: Normal. --Superior cerebellar arteries: Normal. --Posterior cerebral arteries (PCA): Normal. ANTERIOR CIRCULATION: --Intracranial internal carotid arteries: Atherosclerotic calcification of the internal carotid arteries at the skull base without hemodynamically significant stenosis. --Anterior cerebral arteries (ACA): Normal. Both A1 segments are present. Patent anterior communicating artery (a-comm). --Middle cerebral arteries (MCA): Normal. VENOUS SINUSES: As permitted by contrast timing, patent. ANATOMIC VARIANTS: None Review of the MIP images confirms the above findings. IMPRESSION: 1. No emergent large vessel occlusion or hemodynamically significant stenosis of the head or neck. 2. Old  left parietotemporal infarct and chronic ischemic microangiopathy. Aortic atherosclerosis (ICD10-I70.0). Electronically Signed: By: Ulyses Jarred M.D. On: 07/23/2022 22:40    PHYSICAL EXAM Constitutional: Appears well-developed and well-nourished pleasant middle-age Caucasian male, in no acute distress  Psych: Affect appropriate to situation, calm and cooperative with exam Eyes: No scleral injection HENT: No OP obstrucion MSK: no joint deformities or swelling Cardiovascular: Normal rate and regular rhythm.  Respiratory: Effort normal, non-labored breathing on room air GI: Soft.  No distension. There is no tenderness.  Skin: WDI  Neuro: Mental Status: [name] awake, alert, oriented to person, place, month, year, and situation [name] able to give a clear and coherent history of present illness.  No signs of aphasia or neglect. Follows commands without difficulty.  Cranial Nerves: II: Right inferior quadrant homonymous hemianopia. PERRL III,IV, VI: EOMI without ptosis or diploplia.  V: Decreased light touch sensation of the right face VII: Facial movement is symmetric resting and with movement VIII: Hearing is intact to voice X: Palate elevates symmetrically XI: Shoulder shrug is symmetric. XII: Tongue protrudes midline Motor: Tone is normal. Bulk is normal.  5/5 strength was present in all four extremities without vertical drift. Patient does endorse subjective minimal "heaviness" of the right upper and lower extremities.  Diminished fine finger movements on the right and orbits left or right upper extremity. 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 Sensory: Decreased sensation to light touch present on the right upper and lower extremities Cerebellar: FNF intact bilaterally and HKS are intact bilaterally.   NIHSS 2. Premorbid MRS 1 ASSESSMENT/PLAN [name] is a 62 y.o. male with history of remote CVAs (L  MCA 2012, L thalamic May 2019), CAD s/p MI 2001, CKD, DM2, PVD  s/p stents, tobacco use, and remote history of seizure realted to a Metformin overdose presenting with right sided weakness, numbness, and a stumbling gait. LKW 03:30 AM on 1/29. TNKase not given due to patient presenting outside of the thrombolytic therapy time window, exam not consistent with LVO.   Stroke: left pontine acute infarct likely secondary to small vessel disease.  CTA head & neck no emergent LVO or hemodynamically significant stenosis of the head or neck. 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  MRI  small focus of acute ischemia within the left pons, no hemorrhage or mass effect. Old left occipital infarction.  2D Echo EF 50 to 55%.  Septum and apex kinesis  LDL 21 HgbA1c 6.9 VTE prophylaxis - Lovenox 40 mg SQ injection    Diet   Diet regular Fluid consistency: Thin   clopidogrel 75 mg daily prior to admission, now on aspirin 81 mg daily and clopidogrel 75 mg daily for 21 days followed by clopidogrel monotherapy.  Therapy recommendations:  pending Disposition:  pending  History of cryptogenic stroke 2019 left thalamic infarction. Left MCA stroke 2012 presenting with right-sided numbness with minimal residual right hand numbness. CVA in 2012 presenting with aphasia improved over 6 months- work up completed in [place], [place]. - Advised to stop smoking - Previously on ASA, transitioned to clopidogrel after 2nd CVA - Embolic pattern of stroke noted in 2012, TCD bubble study negative for PFO, lower extremity dopplers negative - Follows with [facility] outpatient   History of seizure in the setting of accidental Metformin overdose -No further seizures -Previously on Keppra  Hypertension Home meds:  None Stable Permissive hypertension (OK if < 220/120) but gradually normalize in 5-7 days Long-term BP goal normotensive  Hyperlipidemia Home meds:  lipitor 20 mg, Zetia 10 mg resumed in hospital  LDL 21, goal < 70 High intensity statin not indicated  due to LDL at goal at current home dose Continue statin at discharge  Diabetes type II Controlled Home meds:  Insulin  HgbA1c 6.9, goal < 7.0 CBGs Recent Labs    07/23/22 2109 07/24/22 0254  GLUCAP 138* 100*    SSI  Other Stroke Risk Factors Cigarette smoker advised to stop smoking Hx stroke/TIA Coronary artery disease  Other Active Problems PVD s/p right BKA and multiple stents  Hospital day # 0  Curtis Bradford, AGACNP-BC [facility] Pager: [phone]   STROKE MD NOTE :  I have personally obtained history,examined this patient, reviewed notes, independently viewed imaging studies, participated in medical decision making and plan of care.ROS completed by me personally and pertinent positives fully documented  I have made any additions or clarifications directly to the above note. Agree with note above.  Patient presented with sudden onset of right-sided numbness and gait ataxia with transient diplopia.  MRI scan shows a left ventral pontine lacunar infarct likely from small vessel disease.  He has prior history of cryptogenic left hemispheric infarct in 2012 as well as left thalamic infarct in 2019 and had extensive prior workup .  Recommend dual antiplatelet therapy of aspirin and Plavix for 3 weeks followed by Plavix alone.  Aggressive risk factor modification.  Patient may also consider possible participation in the [place] stroke prevention trial(standard antiplatelet therapy plus placebo versus standard antiplatelet therapy and the Milvexian( factor 11 inhibitor).  He has expressed interest and I will given written information to review and decide.  It was  made clear to the patient that participation involuntary and he will get the the same excellent medical care whether he participates in the study or not.  Discussed with patient and Dr. Jen Mow. Greater than 50% time during this 50-minute visit was spent on counseling and coordination of care about his pontine  stroke and discussion about evaluation and treatment and answering questions. Curtis Contras, MD Medical Director [facility] Stroke [facility] Pager: [phone] 07/24/2022 2:58 PM  To contact Stroke Continuity provider, please refer to [email or web address] After hours, contact General Neurology

## 2022-07-24 NOTE — Evaluation (Signed)
Physical Therapy Evaluation Patient Details Name: Curtis Bradford MRN: [id number] DOB: 1960/07/27 Today's Date: 07/24/2022  History of Present Illness  [name] a 62 y.o. male presenting with right sided weakness, numbness, and a stumbling gait. MRI revealed small focus of acute ischemia within the left pons, no hemorrhage or mass effect. 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   Clinical Impression  Curtis Bradford is 62 y.o. male admitted with above HPI and diagnosis. [name] currently limited by functional impairments below (see PT problem list). Patient lives alone and is independent at baseline with no AD and prosthetic for ambulation. Pt was able to complete transfers and gait with min guard for safety. Overall he demonstrates poor safety awareness and multiple occasions of bumping obstacles on Rt and decreased concern/awareness of environment and how to navigate busy space safely. Cues needed to scan environment and slow down gait velocity. Pt initiated use of RW for gait with poor management often lifting it off the ground pivoting it quickly and away from BOS for turns. EOS pt c/o nausea, hot sensation and had episode of large amount of emesis. RN notified. BP elevated (190/110). Currently recommending OPPT for neuro rehab follow up. Will update recs as needed. Patient will benefit from continued skilled PT interventions to address impairments and progress independence with mobility, recommending OPPT at this time. Acute PT will follow and progress as able.        Recommendations for follow up therapy are one component of a multi-disciplinary discharge planning process, led by the attending physician.  Recommendations may be updated based on patient status, additional functional criteria and  insurance authorization.  Follow Up Recommendations Outpatient PT      Assistance Recommended at Discharge Intermittent Supervision/Assistance  Patient can return home with the following  A little help with walking and/or transfers;A little help with bathing/dressing/bathroom;Assistance with cooking/housework;Assist for transportation;Help with stairs or ramp for entrance    Equipment Recommendations None recommended by PT  Recommendations for Other Services       Functional Status Assessment Patient has had a recent decline in their functional status and demonstrates the ability to make significant improvements in function in a reasonable and predictable amount of time.     Precautions / Restrictions Precautions Precautions: Fall Restrictions Weight Bearing Restrictions: No      Mobility  Bed Mobility Overal bed mobility: Needs Assistance Bed Mobility: Sit to Supine       Sit to supine: Supervision   General bed mobility comments: OOB at start. pt able to bring LE's onto bed without assist at EOS.    Transfers Overall transfer level: Needs assistance Equipment used: None Transfers: Sit to/from Stand Sit to Stand: Min guard           General transfer comment: guard for rise from stretcher with/without RW    Ambulation/Gait Ambulation/Gait assistance: Min guard, Min assist Gait Distance (Feet): 300 Feet Assistive device: None, Rolling walker (2 wheels) Gait Pattern/deviations: Step-through pattern, Decreased stride length, Narrow base of support, Drifts right/left, Staggering right Gait velocity: decr     General Gait Details: Ambulation intiated with no AD, pt unsteady without RW bumping into items on R side, holding/reaching to rail at times in hallway. pt had steps narrow and slightly ataxic however unclear how  much this wss limited by prosthesis PTA. pt ambulated short bout with RW, balance appeared slightly improved however pt  often unsafe with  management of RW not maitnaining centered position to device and picking it up fully with quick movements to turn.  Stairs            Wheelchair Mobility    Modified Rankin (Stroke Patients Only)       Balance Overall balance assessment: Needs assistance Sitting-balance support: No upper extremity supported, Feet supported Sitting balance-Leahy Scale: Fair     Standing balance support: No upper extremity supported, During functional activity, Bilateral upper extremity supported Standing balance-Leahy Scale: Fair Standing balance comment: can stand without assist fairly well, guarding for safety with gait. pt reaching for support of wall rails with no AD for gait.                             Pertinent Vitals/Pain Pain Assessment Pain Assessment: No/denies pain    Home Living Family/patient expects to be discharged to:: Private residence Living Arrangements: Alone Available Help at Discharge: Family;Friend(s);Available PRN/intermittently ("if i really had to" have some help) Type of Home: Apartment Home Access: Level entry       Home Layout: One level Home Equipment: Conservation officer, nature (2 wheels);Tub bench Additional Comments: pt has been independent ambulator with Rt prosthesis since    Prior Function Prior Level of Function : Independent/Modified Independent;Driving;Working/employed             Mobility Comments: no use of AD typically ADLs Comments: Independent with ADLs, IADLs. works near the [place]: Right    Extremity/Trunk Assessment   Upper Extremity Assessment Upper Extremity Assessment: Defer to OT evaluation RUE Deficits / Details: reports complete numbness, dropping items if not looking at what he is holding. strength slightly impaired 4-/5 RUE Sensation: decreased light touch RUE Coordination: WNL    Lower Extremity Assessment Lower Extremity Assessment: Overall WFL for tasks assessed;RLE  deficits/detail RLE Deficits / Details: pt reports lack of sensation in Lt LE leadign to greater difficulty with placement of Rt prosthesis and poor awarenss of prosthesis alignment. RLE Sensation: decreased light touch RLE Coordination: decreased gross motor (slightly ataxic gait)    Cervical / Trunk Assessment Cervical / Trunk Assessment: Normal  Communication   Communication: No difficulties  Cognition Arousal/Alertness: Awake/alert Behavior During Therapy: Flat affect, Impulsive Overall Cognitive Status: Impaired/Different from baseline Area of Impairment: Safety/judgement, Awareness, Attention                   Current Attention Level: Selective     Safety/Judgement: Decreased awareness of deficits, Decreased awareness of safety Awareness: Emergent   General Comments: poor insight into safety and deficits, declined DME need for AD despite obvious balance deficits (later agreeable to try DME), flat affect. poor awareness of obstacles on Rt side of hall and of busy hall environment not pausing at intersections but rather going faster to finish up gait quickly.        General Comments      Exercises     Assessment/Plan    PT Assessment Patient needs continued PT services  PT Problem List Decreased strength;Decreased range of motion;Decreased activity tolerance;Decreased balance;Decreased mobility;Decreased coordination;Decreased cognition;Decreased knowledge of use of DME;Decreased safety awareness;Decreased knowledge of precautions;Obesity       PT Treatment Interventions DME instruction;Gait training;Stair training;Functional mobility training;Therapeutic activities;Therapeutic exercise;Balance training;Neuromuscular re-education;Cognitive remediation;Patient/family  education    PT Goals (Current goals can be found in the Care Plan section)  Acute Rehab PT Goals Patient Stated Goal: to get back home and remain independent, stop throwing up PT Goal Formulation:  With patient Time For Goal Achievement: 08/07/22 Potential to Achieve Goals: Good    Frequency Min 4X/week     Co-evaluation               AM-PAC PT "6 Clicks" Mobility  Outcome Measure Help needed turning from your back to your side while in a flat bed without using bedrails?: None Help needed moving from lying on your back to sitting on the side of a flat bed without using bedrails?: None Help needed moving to and from a bed to a chair (including a wheelchair)?: A Little Help needed standing up from a chair using your arms (e.g., wheelchair or bedside chair)?: A Little Help needed to walk in hospital room?: A Little Help needed climbing 3-5 steps with a railing? : A Lot 6 Click Score: 19    End of Session Equipment Utilized During Treatment: Gait belt Activity Tolerance: Patient tolerated treatment well;Other (comment) (tolerated well during session, EOS pt experience nausea, overheated feeling, and large bout of emesis) Patient left: in bed;with call bell/phone within reach;with nursing/sitter in room Nurse Communication: Mobility status PT Visit Diagnosis: Unsteadiness on feet (R26.81);Muscle weakness (generalized) (M62.81);Difficulty in walking, not elsewhere classified (R26.2);Other symptoms and signs involving the nervous system (R29.898);Other abnormalities of gait and mobility (R26.89);Ataxic gait (R26.0)    Time: 5573-2202 PT Time Calculation (min) (ACUTE ONLY): 20 min   Charges:   PT Evaluation $PT Eval Moderate Complexity: 1 Mod          Verner Mould, DPT Acute Rehabilitation Services Office [phone]  07/24/22 12:39 PM

## 2022-07-24 NOTE — Progress Notes (Signed)
[facility] New Study Start: LIBREXIA-STROKE   SUMMARY For more information refer to: [email or web address]. Study Identifier: [id number] A Phase 3, Randomized, Double-Blind, Parallel-Group, Placebo-Controlled Study to Demonstrate the Efficacy and Safety of Milvexian, an Oral Factor XIa Inhibitor, for Stroke Prevention after an Acute Ischemic Stroke or High-Risk Transient Ischemic Attack  Brief Summary This study will evaluate the efficacy and safety of milvexian in participants after an acute ischemic stroke or high-risk TIA who are receiving antiplatelet therapy standard-of-care.  Design Phase 3, Randomized, Double-Blind, Interventional, Event-Driven  Intervention [id number] (Milvexian) 25 mg or Placebo     Concomitant Therapy Participants will receive SAPT or DAPT. The SAPT or DAPT may be started prior to randomization and the type of antiplatelet agent(s), and duration of treatment will be at the discretion of the investigator. If ASA is used, it will be limited to low dose (75 to 100 mg/day) NSAID (except ASA) may be used concomitantly on a temporary basis but should be avoided for chronic use more than 4 weeks of consecutive therapy)  Prohibited Therapy Chronic (>4 weeks of consecutive use) use of ASA >100 mg per day Current or planned use of isoniazid Concomitant use of omeprazole or esomeprazole with clopidogrel is prohibited. Other use of PPI is allowed and encouraged Additional anticoagulants (e.g., vitamin k antagonists, factor IIa or FXa inhibitors) Use of a combined P-gp and strong CYP3A4 inhibitor (e.g., atazanavir, clarithromycin, itraconazole, ketoconazole, ritonavir, saquinavir) within 7 days of receiving study intervention and during the study is prohibited Use of a combined P-gp and strong CYP3A4 inducer (e.g., carbamazepine, phenytoin, rifampin) within 7 days of receiving study intervention and during the study is prohibited * Prohibited therapies  may be administered on a temporary basis, and if administered, the investigator should discontinue the study intervention. Study intervention may be restarted after the prohibited therapy has been discontinued and after the completion of a suitable washout period at the investigator's discretion  Anticoagulation Prophylaxis The use of anticoagulants for post-stroke DVT prophylaxis after the 3-day window is prohibited and non-pharmacological prophylaxis (e.g., intermittent pneumatic compression) is recommended.   Potential Drug-Drug Interactions Milvexian metabolism: Substrate of CYP3A4; use caution with coadministration of strong CYP3A4 inducers and inhibitors  Administration  Take 1 tablet by mouth twice daily without regards to food intake, at approximately the same time each day. For participants unable to swallow medication, the tablet can be dispersed in water and given via NG tube or in applesauce.  Missed dose If a dose of medication is missed, the dose should be taken as soon as possible. If the missed dose cannot be taken at regular time, next dose should not be doubled. Resume at the next scheduled dose      Plan: Start [Milvexian 25 mg tablets or placebo] tonight. Study medication must be picked up from pharmacy, medication can not be tubed.    Please contact [facility] if any questions or concerns regarding the study medication.    Acey Lav, PharmD, [facility] Pharmacist  [phone]

## 2022-07-25 ENCOUNTER — Encounter: Payer: Self-pay | Admitting: *Deleted

## 2022-07-25 ENCOUNTER — Other Ambulatory Visit: Payer: Self-pay | Admitting: Cardiology

## 2022-07-25 DIAGNOSIS — R42 Dizziness and giddiness: Secondary | ICD-10-CM

## 2022-07-25 DIAGNOSIS — I2510 Atherosclerotic heart disease of native coronary artery without angina pectoris: Secondary | ICD-10-CM | POA: Diagnosis not present

## 2022-07-25 DIAGNOSIS — I639 Cerebral infarction, unspecified: Secondary | ICD-10-CM

## 2022-07-25 DIAGNOSIS — N1831 Chronic kidney disease, stage 3a: Secondary | ICD-10-CM | POA: Diagnosis not present

## 2022-07-25 DIAGNOSIS — R9431 Abnormal electrocardiogram [ECG] [EKG]: Secondary | ICD-10-CM

## 2022-07-25 LAB — CBC WITH DIFFERENTIAL/PLATELET
Abs Immature Granulocytes: 0.05 10*3/uL (ref 0.00–0.07)
Basophils Absolute: 0.1 10*3/uL (ref 0.0–0.1)
Basophils Relative: 1 %
Eosinophils Absolute: 0.4 10*3/uL (ref 0.0–0.5)
Eosinophils Relative: 4 %
HCT: 40.9 % (ref 39.0–52.0)
Hemoglobin: 14.3 g/dL (ref 13.0–17.0)
Immature Granulocytes: 0 %
Lymphocytes Relative: 25 %
Lymphs Abs: 2.9 10*3/uL (ref 0.7–4.0)
MCH: 31.5 pg (ref 26.0–34.0)
MCHC: 35 g/dL (ref 30.0–36.0)
MCV: 90.1 fL (ref 80.0–100.0)
Monocytes Absolute: 1.2 10*3/uL — ABNORMAL HIGH (ref 0.1–1.0)
Monocytes Relative: 11 %
Neutro Abs: 6.8 10*3/uL (ref 1.7–7.7)
Neutrophils Relative %: 59 %
Platelets: 258 10*3/uL (ref 150–400)
RBC: 4.54 MIL/uL (ref 4.22–5.81)
RDW: 14 % (ref 11.5–15.5)
WBC: 11.4 10*3/uL — ABNORMAL HIGH (ref 4.0–10.5)
nRBC: 0 % (ref 0.0–0.2)

## 2022-07-25 LAB — BASIC METABOLIC PANEL
Anion gap: 9 (ref 5–15)
BUN: 22 mg/dL — ABNORMAL HIGH (ref 6–20)
CO2: 25 mmol/L (ref 22–32)
Calcium: 8.4 mg/dL — ABNORMAL LOW (ref 8.9–10.3)
Chloride: 104 mmol/L (ref 98–111)
Creatinine, Ser: 1.47 mg/dL — ABNORMAL HIGH (ref 0.61–1.24)
GFR, Estimated: 54 mL/min — ABNORMAL LOW (ref >60)
Glucose, Bld: 119 mg/dL — ABNORMAL HIGH (ref 70–99)
Potassium: 3.8 mmol/L (ref 3.5–5.1)
Sodium: 138 mmol/L (ref 135–145)

## 2022-07-25 LAB — MAGNESIUM: Magnesium: 2 mg/dL (ref 1.7–2.4)

## 2022-07-25 LAB — GLUCOSE, CAPILLARY: Glucose-Capillary: 114 mg/dL — ABNORMAL HIGH (ref 70–99)

## 2022-07-25 MED ORDER — ORAL CARE MOUTH RINSE: 15.0000 mL | OROMUCOSAL | Status: DC | PRN

## 2022-07-25 MED ORDER — INSULIN GLARGINE 100 UNIT/ML SOLOSTAR PEN: 21.0000 [IU] | PEN_INJECTOR | Freq: Every evening | SUBCUTANEOUS | Status: AC | PRN

## 2022-07-25 MED ORDER — STUDY - LIBREXIA-STROKE - MILVEXIAN 25 MG OR PLACEBO TABLET (PI-SETHI): 1.0000 | ORAL_TABLET | Freq: Two times a day (BID) | ORAL | Status: DC

## 2022-07-25 MED ORDER — ASPIRIN 81 MG PO TBEC: 81.0000 mg | DELAYED_RELEASE_TABLET | Freq: Every day | ORAL | 0 refills | Status: AC

## 2022-07-25 NOTE — Progress Notes (Signed)
30 day event monitor after CVA. Dr. Sallyanne Kuster to read.

## 2022-07-25 NOTE — TOC Transition Note (Signed)
Transition of Care [facility]) - CM/SW Discharge Note   Patient Details  Name: Curtis Bradford MRN: [id number] Date of Birth: Jan 01, 1961  [facility]) CM/SW Contact:  Pollie Friar, RN Phone Number: 07/25/2022, 12:18 PM   Clinical Narrative:    Pt is discharging home with outpatient therapy through [facility]. Information required sent to [facility]. The outpatient therapy will contact him for the first appointment.  No DME needs.  Pt drives self. He works and states his family and he talk occasionally.  He denies any issues with home medications.  CM provided pt with cab voucher to get him to [place] where his car is located.    Final next level of care: OP Rehab Barriers to Discharge: No Barriers Identified   Patient Goals and CMS Choice      Discharge Placement                         Discharge Plan and Services Additional resources added to the After Visit Summary for                                       Social Determinants of Health (SDOH) Interventions SDOH Screenings   Food Insecurity: No Food Insecurity (07/24/2022)  Housing: Low Risk  (07/24/2022)  Transportation Needs: No Transportation Needs (07/24/2022)  Utilities: Not At Risk (07/24/2022)  Depression (PHQ2-9): Low Risk  (10/23/2018)  Tobacco Use: High Risk (07/24/2022)     Readmission Risk Interventions     No data to display

## 2022-07-25 NOTE — Discharge Summary (Addendum)
Physician Discharge Summary  Curtis Bradford BDZ:[id number] DOB: 31-Mar-1961 DOA: 07/23/2022  PCP: Charlane Ferretti, DO  Admit date: 07/23/2022 Discharge date: 07/25/2022  Admitted From: Home Disposition: Home  Recommendations for Outpatient Follow-up:  Follow up with PCP in 1 week with repeat CBC/BMP Outpatient follow-up with neurology Follow up in ED if symptoms worsen or new appear   Home Health: No Equipment/Devices: None  Discharge Condition: Stable CODE STATUS: Full Diet recommendation: Heart healthy  Brief/Interim Summary: 62 y.o. male with medical history significant for insulin-dependent diabetes mellitus, hyperlipidemia, CKD 3A, CAD, and history of CVA presented with right-sided numbness.  On presentation, CT of the head was notable for old infarcts.  CTA of the head and neck was negative for emergent large vessel occlusion or hemodynamically significant stenosis.  MRI of brain showed small focus of acute ischemia within the left pons.  Neurology was consulted.  He was started on aspirin, Plavix and Lipitor was continued.  Subsequently, his symptoms are slightly improving.  He has tolerated PT/OT.  Neurology recommended aspirin and Plavix for 3 weeks then Plavix alone; he has been enrolled in a research trial as per neurology.  Neurology has cleared the patient for discharge.  Discharge patient home today with outpatient follow-up with PCP and neurology.  Discharge Diagnoses:   Acute ischemic stroke presenting with right sided numbness Hyperlipidemia -CT of the head was notable for old infarcts.  CTA of the head and neck was negative for emergent large vessel occlusion or hemodynamically significant stenosis.  MRI of brain showed small focus of acute ischemia within the left pons.  -Subsequently, his symptoms are slightly improving.  He has tolerated PT/OT.  Neurology recommended aspirin and Plavix for 3 weeks then Plavix alone; he has been enrolled in a research trial as per  neurology.  Neurology has cleared the patient for discharge.  Discharge patient home today with outpatient follow-up with PCP and neurology. -A1c 6.9.  LDL 21.  EF of 50 to 55%  Diabetes mellitus type 2 -Carb modified diet.  Continue home regimen.  Outpatient follow-up with PCP  AKI on CKD stage IIIa -Creatinine went up to 1.88.  Treated with IV fluids.  Creatinine has returned back to baseline and is at 1.47 today.  Outpatient follow-up with PCP  Acute metabolic acidosis -Mild.  Outpatient follow-up.  Tolerating diet.  History of CAD -No anginal complaints.  Continue statin and antiplatelets  Hyponatremia -Improved   Leukocytosis -Mild.  Probably reactive.  History of right BKA  Discharge Instructions  Discharge Instructions     Ambulatory referral to Neurology   Complete by: As directed    An appointment is requested in approximately: 2 weeks   Diet - low sodium heart healthy   Complete by: As directed    Diet Carb Modified   Complete by: As directed    Increase activity slowly   Complete by: As directed       Allergies as of 07/25/2022       Reactions   Metformin    Other reaction(s): seizure, ARF        Medication List     TAKE these medications    acetaminophen 325 MG tablet Commonly known as: TYLENOL Take 1-2 tablets (325-650 mg total) by mouth every 6 (six) hours as needed for mild pain (pain score 1-3 or temp > 100.5).   aspirin EC 81 MG tablet Take 1 tablet (81 mg total) by mouth daily for 21 days. Swallow whole. Start taking on: July 26, 2022 What changed: additional instructions Notes to patient: Last dose given 1/31 9am   atorvastatin 20 MG tablet Commonly known as: LIPITOR TAKE 1 TABLET BY MOUTH EVERY DAY Notes to patient: Last dose given 1/31 9am   clopidogrel 75 MG tablet Commonly known as: PLAVIX Take 1 tablet (75 mg total) by mouth at bedtime. Notes to patient: Last dose given 1/31 9am   Dexcom G6 Transmitter Misc See admin  instructions.   ezetimibe 10 MG tablet Commonly known as: ZETIA TAKE 1 TABLET BY MOUTH EVERY DAY   FreeStyle Libre 14 Day Sensor Misc Place 1 patch onto the skin every 14 (fourteen) days.   insulin glargine 100 UNIT/ML Solostar Pen Commonly known as: LANTUS Inject 21-22 Units into the skin at bedtime as needed (blood sugar). Notes to patient: Last dose given1/30 at 10am   LIBREXIA-STROKE milvexian or placebo 25 mg tablet Take 1 tablet by mouth 2 (two) times daily.   Ozempic (1 MG/DOSE) 4 MG/3ML Sopn Generic drug: Semaglutide (1 MG/DOSE) Inject 1 mg into the skin once a week.   tamsulosin 0.4 MG Caps capsule Commonly known as: FLOMAX Take 0.4 mg by mouth daily.            Allergies  Allergen Reactions   Metformin     Other reaction(s): seizure, ARF    Consultations: Neurology   Procedures/Studies: ECHOCARDIOGRAM COMPLETE  Result Date: 07/24/2022    ECHOCARDIOGRAM REPORT   Patient Name:   Curtis Bradford Date of Exam: 07/24/2022 Medical Rec #:  [id number]   Height:       74.0 in Accession #:    [id number]  Weight:       182.0 lb Date of Birth:  May 02, 1961  BSA:          2.088 m Patient Age:    61 years    BP:           152/87 mmHg Patient Gender: M           HR:           85 bpm. Exam Location:  Inpatient Procedure: 2D Echo, Cardiac Doppler, Color Doppler and Intracardiac            Opacification Agent Indications:    stroke  History:        Patient has no prior history of Echocardiogram examinations.                 CAD; Risk Factors:Diabetes, Hypertension and Dyslipidemia.  Sonographer:    Melissa Morford RDCS (AE, PE) Referring Phys: [id number] TIMOTHY S OPYD IMPRESSIONS  1. Hypokinesis of the apical septum and apex. No evidence of thrombus on contrast imaging. Left ventricular ejection fraction, by estimation, is 50 to 55%. The left ventricle has low normal function. The left ventricle demonstrates regional wall motion abnormalities (see scoring diagram/findings for  description). Indeterminate diastolic filling due to E-A fusion.  2. Right ventricular systolic function is normal. The right ventricular size is normal. Tricuspid regurgitation signal is inadequate for assessing PA pressure.  3. The mitral valve is grossly normal. Trivial mitral valve regurgitation. No evidence of mitral stenosis.  4. The aortic valve is tricuspid. There is moderate calcification of the aortic valve. There is moderate thickening of the aortic valve. Aortic valve regurgitation is not visualized. Aortic valve sclerosis/calcification is present, without any evidence of aortic stenosis.  5. The inferior vena cava is normal in size with greater than 50% respiratory variability, suggesting right atrial pressure of 3 mmHg.  Comparison(s): Prior images unable to be directly viewed, comparison made by report only. No significant change from prior study. Similar apical wall motion abnormality described on prior echo. FINDINGS  Left Ventricle: Hypokinesis of the apical septum and apex. No evidence of thrombus on contrast imaging. Left ventricular ejection fraction, by estimation, is 50 to 55%. The left ventricle has low normal function. The left ventricle demonstrates regional  wall motion abnormalities. Definity contrast agent was given IV to delineate the left ventricular endocardial borders. The left ventricular internal cavity size was normal in size. There is no left ventricular hypertrophy. Indeterminate diastolic filling due to E-A fusion.  LV Wall Scoring: The apical septal segment and apex are hypokinetic. Right Ventricle: The right ventricular size is normal. No increase in right ventricular wall thickness. Right ventricular systolic function is normal. Tricuspid regurgitation signal is inadequate for assessing PA pressure. Left Atrium: Left atrial size was normal in size. Right Atrium: Right atrial size was normal in size. Pericardium: There is no evidence of pericardial effusion. Mitral Valve: The  mitral valve is grossly normal. Trivial mitral valve regurgitation. No evidence of mitral valve stenosis. Tricuspid Valve: The tricuspid valve is grossly normal. Tricuspid valve regurgitation is not demonstrated. No evidence of tricuspid stenosis. Aortic Valve: The aortic valve is tricuspid. There is moderate calcification of the aortic valve. There is moderate thickening of the aortic valve. Aortic valve regurgitation is not visualized. Aortic valve sclerosis/calcification is present, without any  evidence of aortic stenosis. Pulmonic Valve: The pulmonic valve was grossly normal. Pulmonic valve regurgitation is not visualized. No evidence of pulmonic stenosis. Aorta: The aortic root is normal in size and structure. Venous: The inferior vena cava is normal in size with greater than 50% respiratory variability, suggesting right atrial pressure of 3 mmHg. IAS/Shunts: The atrial septum is grossly normal.  LEFT VENTRICLE PLAX 2D LVIDd:         3.90 cm      Diastology LVIDs:         3.20 cm      LV e' medial:    3.59 cm/s LV PW:         1.10 cm      LV E/e' medial:  1.0 LV IVS:        1.10 cm      LV e' lateral:   3.70 cm/s LVOT diam:     2.20 cm      LV E/e' lateral: 1.0 LV SV:         55 LV SV Index:   26 LVOT Area:     3.80 cm  LV Volumes (MOD) LV vol d, MOD A2C: 131.0 ml LV vol d, MOD A4C: 127.0 ml LV vol s, MOD A2C: 96.2 ml LV vol s, MOD A4C: 76.7 ml LV SV MOD A2C:     34.8 ml LV SV MOD A4C:     127.0 ml LV SV MOD BP:      44.7 ml RIGHT VENTRICLE RV S prime:     13.40 cm/s TAPSE (M-mode): 1.9 cm LEFT ATRIUM             Index        RIGHT ATRIUM           Index LA diam:        3.30 cm 1.58 cm/m   RA Area:     11.90 cm LA Vol (A2C):   46.3 ml 22.17 ml/m  RA Volume:   27.10 ml  12.98  ml/m LA Vol (A4C):   37.5 ml 17.96 ml/m LA Biplane Vol: 41.7 ml 19.97 ml/m  AORTIC VALVE LVOT Vmax:   72.50 cm/s LVOT Vmean:  54.000 cm/s LVOT VTI:    0.144 m  AORTA Ao Root diam: 2.90 cm MV E velocity: 3.59 cm/s                            SHUNTS                           Systemic VTI:  0.14 m                           Systemic Diam: 2.20 cm Lennie Odor MD Electronically signed by Lennie Odor MD Signature Date/Time: 07/24/2022/10:53:23 AM    Final    MR BRAIN WO CONTRAST  Result Date: 07/24/2022 CLINICAL DATA:  Acute neurologic deficit EXAM: MRI HEAD WITHOUT CONTRAST TECHNIQUE: Multiplanar, multiecho pulse sequences of the brain and surrounding structures were obtained without intravenous contrast. COMPARISON:  None Available. FINDINGS: Brain: Small focus of acute ischemia within the left pons. Old left occipital infarct. Approximately 5 scattered chronic microhemorrhages. 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 There is multifocal hyperintense T2-weighted signal within the white matter. Generalized volume loss. The midline structures are normal. Vascular: Normal flow voids. Skull and upper cervical spine: Normal marrow signal. Sinuses/Orbits: Negative. Other: None. IMPRESSION: 1. Small focus of acute ischemia within the left pons. No hemorrhage or mass effect. 2. Old left occipital infarct. Electronically Signed   By: Deatra Robinson M.D.   On: 07/24/2022 02:04   CT ANGIO HEAD NECK W WO CM  Addendum Date: 07/23/2022   ADDENDUM REPORT: 07/23/2022 22:51 ADDENDUM: There is mild atherosclerotic disease at both carotid bifurcations without hemodynamically significant stenosis. Electronically Signed   By: Deatra Robinson M.D.   On: 07/23/2022 22:51   Result Date: 07/23/2022 CLINICAL DATA:  Right face, arm and leg numbness EXAM: CT ANGIOGRAPHY HEAD AND NECK TECHNIQUE: Multidetector CT imaging of the head and neck was performed using the standard protocol during bolus administration of intravenous contrast. Multiplanar CT image reconstructions and MIPs were obtained to evaluate the vascular anatomy. Carotid stenosis measurements (when applicable) are obtained utilizing NASCET criteria, using the distal internal carotid diameter as  the denominator. RADIATION DOSE REDUCTION: This exam was performed according to the departmental dose-optimization program which includes automated exposure control, adjustment of the mA and/or kV according to patient size and/or use of iterative reconstruction technique. CONTRAST:  46mL OMNIPAQUE IOHEXOL 350 MG/ML SOLN COMPARISON:  10/31/2017 FINDINGS: CT HEAD FINDINGS Brain: There is no mass, hemorrhage or extra-axial collection. There is generalized atrophy without lobar predilection. Old left parietotemporal infarct. There is hypoattenuation of the periventricular white matter, most commonly indicating chronic ischemic microangiopathy. Skull: The visualized skull base, calvarium and extracranial soft tissues are normal. Sinuses/Orbits: No fluid levels or advanced mucosal thickening of the visualized paranasal sinuses. No mastoid or middle ear effusion. The orbits are normal. CTA NECK FINDINGS SKELETON: There is no bony spinal canal stenosis. No lytic or blastic lesion. OTHER NECK: Normal pharynx, larynx and major salivary glands. No cervical lymphadenopathy. Unremarkable thyroid gland. UPPER CHEST: No pneumothorax or pleural effusion. No nodules or masses. AORTIC ARCH: There is calcific atherosclerosis of the aortic arch. There is no aneurysm, dissection or hemodynamically significant stenosis of the visualized portion of  the aorta. Conventional 3 vessel aortic branching pattern. The visualized proximal subclavian arteries are widely patent. RIGHT CAROTID SYSTEM: Normal without aneurysm, dissection or stenosis. LEFT CAROTID SYSTEM: Normal without aneurysm, dissection or stenosis. VERTEBRAL ARTERIES: Left dominant configuration. Both origins are clearly patent. There is no dissection, occlusion or flow-limiting stenosis to the skull base (V1-V3 segments). CTA HEAD FINDINGS POSTERIOR CIRCULATION: --Vertebral arteries: Normal V4 segments. --Inferior cerebellar arteries: Normal. --Basilar artery: Normal. --Superior  cerebellar arteries: Normal. --Posterior cerebral arteries (PCA): Normal. ANTERIOR CIRCULATION: --Intracranial internal carotid arteries: Atherosclerotic calcification of the internal carotid arteries at the skull base without hemodynamically significant stenosis. --Anterior cerebral arteries (ACA): Normal. Both A1 segments are present. Patent anterior communicating artery (a-comm). --Middle cerebral arteries (MCA): Normal. VENOUS SINUSES: As permitted by contrast timing, patent. ANATOMIC VARIANTS: None Review of the MIP images confirms the above findings. IMPRESSION: 1. No emergent large vessel occlusion or hemodynamically significant stenosis of the head or neck. 2. 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 Aortic atherosclerosis (ICD10-I70.0). Electronically Signed: By: Ulyses Jarred M.D. On: 07/23/2022 22:40      Subjective: Patient seen and examined at bedside.  He feels better enough to go home today.  Symptoms are slightly improving.  No fever, worsening shortness of breath, chest pain reported.  Discharge Exam: Vitals:   07/25/22 0018 07/25/22 0337  BP: (!) 161/85 (!) 153/92  Pulse: 88 85  Resp: 18   Temp: (!) 97.4 F (36.3 C) 98.4 F (36.9 C)  SpO2: 99% 98%    General: Pt is alert, awake, not in acute distress.  On room air. Cardiovascular: rate controlled, S1/S2 + Respiratory: bilateral decreased breath sounds at bases Abdominal: Soft, NT, ND, bowel sounds + Extremities: Right BKA present.   The results of significant diagnostics from this hospitalization (including imaging, microbiology, ancillary and laboratory) are listed below for reference.     Microbiology: No results found for this or any previous visit (from the past 240 hour(s)).   Labs: BNP (last 3 results) No results for input(s): "BNP" in the last 8760 hours. Basic Metabolic Panel: Recent Labs  Lab 07/23/22 2127 07/24/22 0442 07/25/22 0615  NA 133* 135 138  K 4.3 4.0 3.8   CL 102 103 104  CO2 22 21* 25  GLUCOSE 147* 131* 119*  BUN 19 24* 22*  CREATININE 1.50* 1.88* 1.47*  CALCIUM 8.7* 8.6* 8.4*  MG  --   --  2.0   Liver Function Tests: Recent Labs  Lab 07/23/22 2127 07/24/22 0442  AST 15 19  ALT 19 17  ALKPHOS 84 66  BILITOT 0.5 0.6  PROT 7.8 5.8*  ALBUMIN 4.3 3.4*   No results for input(s): "LIPASE", "AMYLASE" in the last 168 hours. No results for input(s): "AMMONIA" in the last 168 hours. CBC: Recent Labs  Lab 07/23/22 2127 07/24/22 0442 07/25/22 0615  WBC 11.4* 12.0* 11.4*  NEUTROABS 7.2  --  6.8  HGB 16.7 15.1 14.3  HCT 49.2 44.2 40.9  MCV 90.1 91.9 90.1  PLT 301 287 258   Cardiac Enzymes: No results for input(s): "CKTOTAL", "CKMB", "CKMBINDEX", "TROPONINI" in the last 168 hours. BNP: Invalid input(s): "POCBNP" CBG: Recent Labs  Lab 07/24/22 1013 07/24/22 1145 07/24/22 1731 07/24/22 2205 07/25/22 0651  GLUCAP 170* 172* 151* 110* 114*   D-Dimer No results for input(s): "DDIMER" in the last 72 hours. Hgb A1c Recent Labs    07/24/22 0442  HGBA1C 6.9*   Lipid Profile Recent Labs    07/24/22 0442  CHOL 89  HDL 26*  LDLCALC 21  TRIG 212*  CHOLHDL 3.4   Thyroid function studies Recent Labs    07/23/22 2127  TSH 2.032   Anemia work up No results for input(s): "VITAMINB12", "FOLATE", "FERRITIN", "TIBC", "IRON", "RETICCTPCT" in the last 72 hours. Urinalysis    Component Value Date/Time   COLORURINE YELLOW 03/09/2019 0148   APPEARANCEUR CLOUDY (A) 03/09/2019 0148   LABSPEC >1.030 (H) 03/09/2019 0148   PHURINE 5.5 03/09/2019 0148   GLUCOSEU NEGATIVE 03/09/2019 0148   HGBUR MODERATE (A) 03/09/2019 0148   BILIRUBINUR NEGATIVE 03/09/2019 0148   KETONESUR NEGATIVE 03/09/2019 0148   PROTEINUR 100 (A) 03/09/2019 0148   NITRITE POSITIVE (A) 03/09/2019 0148   LEUKOCYTESUR SMALL (A) 03/09/2019 0148   Sepsis Labs Recent Labs  Lab 07/23/22 2127 07/24/22 0442 07/25/22 0615  WBC 11.4* 12.0* 11.4*    Microbiology No results found for this or any previous visit (from the past 240 hour(s)).   Time coordinating discharge: 35 minutes  SIGNED:   Aline August, MD  [facility] 07/25/2022, 10:02 AM

## 2022-07-25 NOTE — Progress Notes (Addendum)
STROKE TEAM PROGRESS NOTE   INTERVAL HISTORY No family is present at bedside.  Neurological exam stable and unchanged. VSS.  Recommend 30 day heart monitor following discharge. [name] participating in the [place] Stroke  stroke study Vitals:   07/24/22 2100 07/25/22 0018 07/25/22 0337 07/25/22 1150  BP: (!) 151/93 (!) 161/85 (!) 153/92 128/73  Pulse: 88 88 85 78  Resp: 16 18  16   Temp: 98.4 F (36.9 C) (!) 97.4 F (36.3 C) 98.4 F (36.9 C) 98 F (36.7 C)  TempSrc: Oral Oral Oral Oral  SpO2: 100% 99% 98% 100%  Weight:      Height:       CBC:  Recent Labs  Lab 07/23/22 2127 07/24/22 0442 07/25/22 0615  WBC 11.4* 12.0* 11.4*  NEUTROABS 7.2  --  6.8  HGB 16.7 15.1 14.3  HCT 49.2 44.2 40.9  MCV 90.1 91.9 90.1  PLT 301 287 924    Basic Metabolic Panel:  Recent Labs  Lab 07/24/22 0442 07/25/22 0615  NA 135 138  K 4.0 3.8  CL 103 104  CO2 21* 25  GLUCOSE 131* 119*  BUN 24* 22*  CREATININE 1.88* 1.47*  CALCIUM 8.6* 8.4*  MG  --  2.0    Lipid Panel:  Recent Labs  Lab 07/24/22 0442  CHOL 89  TRIG 212*  HDL 26*  CHOLHDL 3.4  VLDL 42*  LDLCALC 21    HgbA1c:  Recent Labs  Lab 07/24/22 0442  HGBA1C 6.9*    Urine Drug Screen: No results for input(s): "LABOPIA", "COCAINSCRNUR", "LABBENZ", "AMPHETMU", "THCU", "LABBARB" in the last 168 hours.  Alcohol Level No results for input(s): "ETH" in the last 168 hours.  IMAGING past 24 hours No results found.  PHYSICAL EXAM Constitutional: Appears well-developed and well-nourished pleasant middle-age Caucasian male, in no acute distress  Psych: Affect appropriate to situation, calm and cooperative with exam Eyes: No scleral injection HENT: No OP obstrucion MSK: no joint deformities or swelling Cardiovascular: Normal rate and regular rhythm.  Respiratory: Effort normal, non-labored breathing on room air GI: Soft.  No distension. There is no tenderness.  Skin: WDI  Neuro: Mental Status: [name]  awake, alert, oriented to person, place, month, year, and situation [name] able to give a clear and coherent history of present illness.  No signs of aphasia or neglect. Follows commands without difficulty.  Cranial Nerves: II: Right inferior quadrant homonymous hemianopia. PERRL III,IV, VI: EOMI without ptosis or diploplia.  V: Decreased light touch sensation of the right face VII: Facial movement is symmetric resting and with movement VIII: Hearing is intact to voice X: Palate elevates symmetrically XI: Shoulder shrug is symmetric. XII: Tongue protrudes midline Motor: Tone is normal. Bulk is normal.  5/5 strength was present in all four extremities without vertical drift. Patient does endorse subjective minimal "heaviness" of the right upper and lower extremities.  Diminished fine finger movements on the right and orbits left or right upper extremity. 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 Sensory: Decreased sensation to light touch present on the right upper and lower extremities Cerebellar: FNF intact bilaterally and HKS are intact bilaterally.   NIHSS 2. Premorbid MRS 1  ASSESSMENT/PLAN [name] is a 62 y.o. male with history of remote CVAs (L MCA 2012, L thalamic May 2019), CAD s/p MI 2001, CKD, DM2, PVD s/p stents, tobacco use, and remote history of seizure realted to a Metformin overdose presenting with right sided weakness, numbness, and a stumbling gait. LKW  03:30 AM on 1/29. TNKase not given due to patient presenting outside of the thrombolytic therapy time window, exam not consistent with LVO.   Stroke: left pontine acute infarct likely secondary to small vessel disease.  CTA head & neck no emergent LVO or hemodynamically significant stenosis of the head or neck. 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  MRI  small focus of acute ischemia within the left pons, no hemorrhage or mass effect. Old left occipital infarction.  2D Echo EF 50  to 55%.  Septum and apex kinesis  Recommend 30 day heart monitor. Request to cards sent  Follow up with outpatient neurology in 8 weeks after discharge  LDL 21 HgbA1c 6.9 VTE prophylaxis - Lovenox 40 mg SQ injection    Diet   Diet regular Fluid consistency: Thin   clopidogrel 75 mg daily prior to admission, now on aspirin 81 mg daily and clopidogrel 75 mg daily for 21 days followed by clopidogrel monotherapy.  And [place] stroke study ( [place] or placebo ) Therapy recommendations:  outpatient Disposition:  home  History of cryptogenic stroke 2019 left thalamic infarction. Left MCA stroke 2012 presenting with right-sided numbness with minimal residual right hand numbness. CVA in 2012 presenting with aphasia improved over 6 months- work up completed in [place], [place]. - Advised to stop smoking - Previously on ASA, transitioned to clopidogrel after 2nd CVA - Embolic pattern of stroke noted in 2012, TCD bubble study negative for PFO, lower extremity dopplers negative - Follows with [facility] outpatient   History of seizure in the setting of accidental Metformin overdose -No further seizures -Previously on Keppra  Hypertension Home meds:  None Stable Permissive hypertension (OK if < 220/120) but gradually normalize in 5-7 days Long-term BP goal normotensive  Hyperlipidemia Home meds:  lipitor 20 mg, Zetia 10 mg resumed in hospital  LDL 21, goal < 70 High intensity statin not indicated due to LDL at goal at current home dose Continue statin at discharge  Diabetes type II Controlled Home meds:  Insulin  HgbA1c 6.9, goal < 7.0 CBGs Recent Labs    07/24/22 1731 07/24/22 2205 07/25/22 0651  GLUCAP 151* 110* 114*     SSI  Other Stroke Risk Factors Cigarette smoker advised to stop smoking Hx stroke/TIA Coronary artery disease  Other Active Problems PVD s/p right BKA and multiple stents  Hospital day # 1  Beulah Gandy DNP, ACNPC-AG  [facility] I have  personally obtained history,examined this patient, reviewed notes, independently viewed imaging studies, participated in medical decision making and plan of care.ROS completed by me personally and pertinent positives fully documented  I have made any additions or clarifications directly to the above note. Agree with note above.  [name] doing well and has no new complaints.  He has been discharged and is participating in the [place] stroke study ( [place] or placebo ).  Follow-up as an outpatient as per study protocol. Greater than 50% time during this 35-minute visit was spent on counseling and coordination of care about his lacunar stroke and discussion about evaluation and prevention and treatment and answering questions.  Discussed with Dr.Kshitz Antony Contras, MD Medical Director [facility] Pager: [phone] 07/25/2022 1:06 PM  To contact Stroke Continuity provider, please refer to [email or web address] After hours, contact General Neurology

## 2022-07-25 NOTE — Plan of Care (Signed)
VeRN reviewed AVS with pt and did discharge education. VeRn answered question the pt had and provided more educational support. Pt stated understood the discharge information and had no further questions.  Problem: Education: Goal: Ability to describe self-care measures that may prevent or decrease complications (Diabetes Survival Skills Education) will improve Outcome: Adequate for Discharge Goal: Individualized Educational Video(s) Outcome: Adequate for Discharge   Problem: Coping: Goal: Ability to adjust to condition or change in health will improve Outcome: Adequate for Discharge   Problem: Fluid Volume: Goal: Ability to maintain a balanced intake and output will improve Outcome: Adequate for Discharge   Problem: Health Behavior/Discharge Planning: Goal: Ability to identify and utilize available resources and services will improve Outcome: Adequate for Discharge Goal: Ability to manage health-related needs will improve Outcome: Adequate for Discharge   Problem: Metabolic: Goal: Ability to maintain appropriate glucose levels will improve Outcome: Adequate for Discharge   Problem: Nutritional: Goal: Maintenance of adequate nutrition will improve Outcome: Adequate for Discharge Goal: Progress toward achieving an optimal weight will improve Outcome: Adequate for Discharge   Problem: Skin Integrity: Goal: Risk for impaired skin integrity will decrease Outcome: Adequate for Discharge   Problem: Tissue Perfusion: Goal: Adequacy of tissue perfusion will improve Outcome: Adequate for Discharge   Problem: Education: Goal: Knowledge of disease or condition will improve Outcome: Adequate for Discharge Goal: Knowledge of secondary prevention will improve (MUST DOCUMENT ALL) Outcome: Adequate for Discharge Goal: Knowledge of patient specific risk factors will improve [place] N/A or DELETE if not current risk factor) Outcome: Adequate for Discharge   Problem: Ischemic Stroke/TIA  Tissue Perfusion: Goal: Complications of ischemic stroke/TIA will be minimized Outcome: Adequate for Discharge   Problem: Coping: Goal: Will verbalize positive feelings about self Outcome: Adequate for Discharge Goal: Will identify appropriate support needs Outcome: Adequate for Discharge   Problem: Health Behavior/Discharge Planning: Goal: Ability to manage health-related needs will improve Outcome: Adequate for Discharge Goal: Goals will be collaboratively established with patient/family Outcome: Adequate for Discharge   Problem: Self-Care: Goal: Ability to participate in self-care as condition permits will improve Outcome: Adequate for Discharge Goal: Verbalization of feelings and concerns over difficulty with self-care will improve Outcome: Adequate for Discharge Goal: Ability to communicate needs accurately will improve Outcome: Adequate for Discharge   Problem: Nutrition: Goal: Risk of aspiration will decrease Outcome: Adequate for Discharge Goal: Dietary intake will improve Outcome: Adequate for Discharge   Problem: Education: Goal: Knowledge of General Education information will improve Description: Including pain rating scale, medication(s)/side effects and non-pharmacologic comfort measures Outcome: Adequate for Discharge   Problem: Health Behavior/Discharge Planning: Goal: Ability to manage health-related needs will improve Outcome: Adequate for Discharge   Problem: Clinical Measurements: Goal: Ability to maintain clinical measurements within normal limits will improve Outcome: Adequate for Discharge Goal: Will remain free from infection Outcome: Adequate for Discharge Goal: Diagnostic test results will improve Outcome: Adequate for Discharge Goal: Respiratory complications will improve Outcome: Adequate for Discharge Goal: Cardiovascular complication will be avoided Outcome: Adequate for Discharge   Problem: Activity: Goal: Risk for activity intolerance  will decrease Outcome: Adequate for Discharge   Problem: Nutrition: Goal: Adequate nutrition will be maintained Outcome: Adequate for Discharge   Problem: Coping: Goal: Level of anxiety will decrease Outcome: Adequate for Discharge   Problem: Elimination: Goal: Will not experience complications related to bowel motility Outcome: Adequate for Discharge Goal: Will not experience complications related to urinary retention Outcome: Adequate for Discharge   Problem: Pain Managment: Goal: General experience of  comfort will improve Outcome: Adequate for Discharge   Problem: Safety: Goal: Ability to remain free from injury will improve Outcome: Adequate for Discharge   Problem: Skin Integrity: Goal: Risk for impaired skin integrity will decrease Outcome: Adequate for Discharge

## 2022-07-30 ENCOUNTER — Ambulatory Visit (INDEPENDENT_AMBULATORY_CARE_PROVIDER_SITE_OTHER): Payer: 59 | Admitting: Orthopedic Surgery

## 2022-07-30 ENCOUNTER — Encounter: Payer: Self-pay | Admitting: Orthopedic Surgery

## 2022-07-30 DIAGNOSIS — L97521 Non-pressure chronic ulcer of other part of left foot limited to breakdown of skin: Secondary | ICD-10-CM | POA: Diagnosis not present

## 2022-07-30 DIAGNOSIS — Z89511 Acquired absence of right leg below knee: Secondary | ICD-10-CM

## 2022-07-30 NOTE — Progress Notes (Signed)
Will  Office Visit Note   Patient: Curtis Bradford           Date of Birth: 05-Apr-1961           MRN: [id number] Visit Date: 07/30/2022              Requested by: Sueanne Margarita, [place] [place],  [place] [id number] PCP: Sueanne Margarita, DO  Chief Complaint  Patient presents with   Left Foot - Follow-up      HPI: [name] a 62 year old gentleman who presents in follow-up for [facility] grade 1 ulcers x 2 left foot.  Patient states that he is doing better with his prosthesis on the right has been playing golf.  Assessment & Plan: Visit Diagnoses:  1. Non-pressure chronic ulcer of other part of left foot limited to breakdown of skin ([place])   2. Acquired absence of right lower extremity below knee ([place])     Plan: Ulcers debrided x 2 who felt relieving pads placed underneath his custom orthotics to further unload the first and fifth metatarsal head.  Follow-Up Instructions: Return in about 4 weeks (around 08/27/2022).   Ortho Exam  [name] alert, oriented, no adenopathy, well-dressed, normal affect, normal respiratory effort. Examination patient has a palpable dorsalis pedis pulse.  He has a cavovarus foot with a prominent metatarsal heads.  Patient has a Wagner grade 1 ulcer beneath the first and fifth metatarsal head.  After informed consent a 10 blade knife was used to debride the skin and soft tissue back to healthy viable granulation tissue.  The first metatarsal head ulcer is 3 cm in diameter and 2 mm deep and the fifth metatarsal head ulcer is 2 cm in diameter and 1 mm deep.  Imaging: No results found. No images are attached to the encounter.  Labs: Lab Results  Component Value Date   HGBA1C 6.9 (H) 07/24/2022   HGBA1C 7.5 (H) 09/17/2018   HGBA1C 8.3 (H) 07/23/2018   ESRSEDRATE 31 (H) 02/14/2017   CRP 12.3 (H) 02/14/2017   REPTSTATUS 03/13/2019 FINAL 03/11/2019   CULT  03/11/2019    NO GROWTH Performed at [facility], [place] [address] [place], [place]  [address]      Lab Results  Component Value Date   ALBUMIN 3.4 (L) 07/24/2022   ALBUMIN 4.3 07/23/2022   ALBUMIN 3.9 10/22/2018   PREALBUMIN 13.8 (L) 02/15/2017    Lab Results  Component Value Date   MG 2.0 07/25/2022   No results found for: "VD25OH"  Lab Results  Component Value Date   PREALBUMIN 13.8 (L) 02/15/2017      Latest Ref Rng & Units 07/25/2022    6:15 AM 07/24/2022    4:42 AM 07/23/2022    9:27 PM  CBC EXTENDED  WBC 4.0 - 10.5 K/uL 11.4  12.0  11.4   RBC 4.22 - 5.81 MIL/uL 4.54  4.81  5.46   Hemoglobin 13.0 - 17.0 g/dL 14.3  15.1  16.7   HCT 39.0 - 52.0 % 40.9  44.2  49.2   Platelets 150 - 400 K/uL 258  287  301   NEUT# 1.7 - 7.7 K/uL 6.8   7.2   Lymph# 0.7 - 4.0 K/uL 2.9   2.7      There is no height or weight on file to calculate BMI.  Orders:  No orders of the defined types were placed in this encounter.  No orders of the defined types were placed in this encounter.  Procedures: No procedures performed  Clinical Data: No additional findings.  ROS:  All other systems negative, except as noted in the HPI. Review of Systems  Objective: Vital Signs: There were no vitals taken for this visit.  Specialty Comments:  No specialty comments available.  PMFS History: Patient Active Problem List   Diagnosis Date Noted   Acute ischemic stroke ([place]) 07/24/2022   CVA (cerebral vascular accident) ([place]) 07/24/2022   Alkaline phosphatase raised 11/15/2020   Atherosclerosis of coronary artery without angina pectoris 11/15/2020   Benign prostatic hyperplasia with lower urinary tract symptoms 11/15/2020   Hyperglycemia 11/15/2020   Late effects of cerebrovascular disease 11/15/2020   Long term (current) use of insulin ([place]) 11/15/2020   Melena 11/15/2020   Microalbuminuria 11/15/2020   Personal history of other specified conditions 11/15/2020   Right toe amputee ([place]) 11/15/2020   Ulcer of left foot, limited to breakdown of skin ([place]) 11/15/2020    Vitamin D deficiency 11/15/2020   S/P BKA (below knee amputation) unilateral, right (HCC) 10/23/2018   Intermittent pain    Phantom limb pain (HCC)    Stage 3a chronic kidney disease (CKD) ([place])    Labile blood glucose    Other disorders of plasma-protein metabolism, not elsewhere classified 09/22/2018   Acute blood loss anemia    Hypoalbuminemia due to protein-calorie malnutrition (HCC)    Diabetes mellitus type 2 in nonobese [facility])    Postoperative pain    Neuropathic pain    Seizures ([place])    Right below-knee amputee ([place]) 09/19/2018   Below knee amputation ([place]) 09/17/2018   Status post transmetatarsal amputation of foot, right ([place]) 07/23/2018   Osteomyelitis ([place]) 07/23/2018   TIA (transient ischemic attack) 10/30/2017   Medication monitoring encounter 03/28/2017   Status post amputation of toe of right foot ([place]) 03/05/2017   Subacute osteomyelitis, right ankle and foot ([place])    Peripheral neuropathy 02/16/2017   Peripheral artery disease ([place]) 02/16/2017   Type 2 diabetes mellitus with diabetic foot ulcer ([place]) 02/14/2017   Diabetic foot infection ([place]) 02/14/2017   Type II diabetes mellitus with renal manifestations ([place])    HLD (hyperlipidemia)    Tobacco abuse    CKD stage 2 due to type 2 diabetes mellitus ([place])    CAD (coronary artery disease)    Seizure disorder ([place])    Past Medical History:  Diagnosis Date   CAD (coronary artery disease)    CKD (chronic kidney disease), stage III ([place])    Diabetes mellitus without complication ([place])    Type II   History of DVT (deep vein thrombosis)    History of kidney stones    passed stones - no surgery   HLD (hyperlipidemia)    Myocardial infarction ([place]) 2009   Peripheral vascular disease ([place])    right leg   Seizure ([place])    last one 2015   Stroke ([place]) 2012, 10/2017   2012-speech was effected- has come back.10/2017- numbness of right side no taste right  side. 2012-   Tobacco abuse     Family History  Problem  Relation Age of Onset   Diabetes Mellitus II Mother    Diabetes Mellitus II Father    Colon cancer Neg Hx    Esophageal cancer Neg Hx     Past Surgical History:  Procedure Laterality Date   AMPUTATION Right 02/23/2017   Procedure: RIGHT FOOT 5TH RAY AMPUTATION;  Surgeon: Newt Minion, MD;  Location: [facility];  Service: Orthopedics;  Laterality: Right;  AMPUTATION Right 04/23/2018   Procedure: Right Great Toe Amputation;  Surgeon: Newt Minion, MD;  Location: [facility];  Service: Orthopedics;  Laterality: Right;   AMPUTATION Right 07/23/2018   Procedure: RIGHT TRANSMETATARSAL AMPUTATION, APPLY WOUND VAC;  Surgeon: Newt Minion, MD;  Location: [facility];  Service: Orthopedics;  Laterality: Right;   AMPUTATION Right 09/17/2018   Procedure: RIGHT AMPUTATION BELOW KNEE;  Surgeon: Newt Minion, MD;  Location: [place];  Service: Orthopedics;  Laterality: Right;   APPLICATION OF WOUND VAC Right 09/17/2018   Procedure: Application Of Wound Vac;  Surgeon: Newt Minion, MD;  Location: [facility];  Service: Orthopedics;  Laterality: Right;   BELOW KNEE LEG AMPUTATION Right 09/17/2018   CORONARY ANGIOPLASTY  03/05/2008   stent   FOOT SURGERY     PERIPHERAL ARTERIAL STENT GRAFT Right 2014   leg 2 stents- clotted per pat   STENT PLACEMENT VASCULAR ([place] HX)     Several in right leg   TONSILLECTOMY     TRANSMETATARSAL AMPUTATION Right 07/23/2018   Social History   Occupational History   Not on file  Tobacco Use   Smoking status: Light Smoker    Packs/day: 0.25    Years: 15.00    Total pack years: 3.75    Types: Cigarettes   Smokeless tobacco: Never   Tobacco comments:    2 or 3 a day   Vaping Use   Vaping Use: Never used  Substance and Sexual Activity   Alcohol use: Not Currently   Drug use: No   Sexual activity: Not on file

## 2022-08-27 ENCOUNTER — Ambulatory Visit (INDEPENDENT_AMBULATORY_CARE_PROVIDER_SITE_OTHER): Payer: 59 | Admitting: Orthopedic Surgery

## 2022-08-27 ENCOUNTER — Encounter: Payer: Self-pay | Admitting: Orthopedic Surgery

## 2022-08-27 DIAGNOSIS — Z89511 Acquired absence of right leg below knee: Secondary | ICD-10-CM | POA: Diagnosis not present

## 2022-08-27 DIAGNOSIS — L97521 Non-pressure chronic ulcer of other part of left foot limited to breakdown of skin: Secondary | ICD-10-CM

## 2022-08-27 NOTE — Progress Notes (Signed)
Office Visit Note   Patient: Curtis Bradford           Date of Birth: 25-Jan-1961           MRN: [id number] Visit Date: 08/27/2022              Requested by: Sueanne Margarita, [address] [place],  [place] [address] PCP: Sueanne Margarita, DO  Chief Complaint  Patient presents with   Left Foot - Wound Check      HPI: [name] a 62 year old gentleman who presents in follow-up for Wagner grade 1 ulcer beneath the left foot first metatarsal head.  Patient has been using a felt relieving pad.  Assessment & Plan: Visit Diagnoses:  1. Non-pressure chronic ulcer of other part of left foot limited to breakdown of skin ([place])     Plan: Ulcer was debrided back to healthy viable tissue left first metatarsal head.  [name] going to need a new prosthesis for his right transtibial amputation.  He will follow-up when he is ready for the new prosthesis he will need new foot and ankle socket and liner.  Follow-Up Instructions: Return in about 2 months (around 10/27/2022).   Ortho Exam  [name] alert, oriented, no adenopathy, well-dressed, normal affect, normal respiratory effort. Examination patient has a Wagner grade 1 ulcer beneath the left first metatarsal head.  After informed consent a 10 blade knife was used debride the skin and soft tissue back to healthy viable tissue.  The wound was 3 cm in diameter 1 mm deep after debridement.  There is also a callus beneath the fifth metatarsal head and this was pared without complication.  Imaging: No results found. No images are attached to the encounter.  Labs: Lab Results  Component Value Date   HGBA1C 6.9 (H) 07/24/2022   HGBA1C 7.5 (H) 09/17/2018   HGBA1C 8.3 (H) 07/23/2018   ESRSEDRATE 31 (H) 02/14/2017   CRP 12.3 (H) 02/14/2017   REPTSTATUS 03/13/2019 FINAL 03/11/2019   CULT  03/11/2019    NO GROWTH Performed at [facility], [place] [address], [place], [place] [address]      Lab Results  Component Value Date   ALBUMIN 3.4  (L) 07/24/2022   ALBUMIN 4.3 07/23/2022   ALBUMIN 3.9 10/22/2018   PREALBUMIN 13.8 (L) 02/15/2017    Lab Results  Component Value Date   MG 2.0 07/25/2022   No results found for: "VD25OH"  Lab Results  Component Value Date   PREALBUMIN 13.8 (L) 02/15/2017      Latest Ref Rng & Units 07/25/2022    6:15 AM 07/24/2022    4:42 AM 07/23/2022    9:27 PM  CBC EXTENDED  WBC 4.0 - 10.5 K/uL 11.4  12.0  11.4   RBC 4.22 - 5.81 MIL/uL 4.54  4.81  5.46   Hemoglobin 13.0 - 17.0 g/dL 14.3  15.1  16.7   HCT 39.0 - 52.0 % 40.9  44.2  49.2   Platelets 150 - 400 K/uL 258  287  301   NEUT# 1.7 - 7.7 K/uL 6.8   7.2   Lymph# 0.7 - 4.0 K/uL 2.9   2.7      There is no height or weight on file to calculate BMI.  Orders:  No orders of the defined types were placed in this encounter.  No orders of the defined types were placed in this encounter.    Procedures: No procedures performed  Clinical Data: No additional findings.  ROS:  All other systems negative, except as noted in the HPI. Review of Systems  Objective: Vital Signs: There were no vitals taken for this visit.  Specialty Comments:  No specialty comments available.  PMFS History: Patient Active Problem List   Diagnosis Date Noted   Acute ischemic stroke ([place]) 07/24/2022   CVA (cerebral vascular accident) ([place]) 07/24/2022   Alkaline phosphatase raised 11/15/2020   Atherosclerosis of coronary artery without angina pectoris 11/15/2020   Benign prostatic hyperplasia with lower urinary tract symptoms 11/15/2020   Hyperglycemia 11/15/2020   Late effects of cerebrovascular disease 11/15/2020   Long term (current) use of insulin ([place]) 11/15/2020   Melena 11/15/2020   Microalbuminuria 11/15/2020   Personal history of other specified conditions 11/15/2020   Right toe amputee ([place]) 11/15/2020   Ulcer of left foot, limited to breakdown of skin ([place]) 11/15/2020   Vitamin D deficiency 11/15/2020   S/P BKA (below knee amputation)  unilateral, right (HCC) 10/23/2018   Intermittent pain    Phantom limb pain (HCC)    Stage 3a chronic kidney disease (CKD) ([place])    Labile blood glucose    Other disorders of plasma-protein metabolism, not elsewhere classified 09/22/2018   Acute blood loss anemia    Hypoalbuminemia due to protein-calorie malnutrition (HCC)    Diabetes mellitus type 2 in nonobese [facility])    Postoperative pain    Neuropathic pain    Seizures ([place])    Right below-knee amputee ([place]) 09/19/2018   Below knee amputation ([place]) 09/17/2018   Status post transmetatarsal amputation of foot, right ([place]) 07/23/2018   Osteomyelitis ([place]) 07/23/2018   TIA (transient ischemic attack) 10/30/2017   Medication monitoring encounter 03/28/2017   Status post amputation of toe of right foot ([place]) 03/05/2017   Subacute osteomyelitis, right ankle and foot ([place])    Peripheral neuropathy 02/16/2017   Peripheral artery disease ([place]) 02/16/2017   Type 2 diabetes mellitus with diabetic foot ulcer ([place]) 02/14/2017   Diabetic foot infection ([place]) 02/14/2017   Type II diabetes mellitus with renal manifestations ([place])    HLD (hyperlipidemia)    Tobacco abuse    CKD stage 2 due to type 2 diabetes mellitus ([place])    CAD (coronary artery disease)    Seizure disorder ([place])    Past Medical History:  Diagnosis Date   CAD (coronary artery disease)    CKD (chronic kidney disease), stage III ([place])    Diabetes mellitus without complication ([place])    Type II   History of DVT (deep vein thrombosis)    History of kidney stones    passed stones - no surgery   HLD (hyperlipidemia)    Myocardial infarction ([place]) 2009   Peripheral vascular disease ([place])    right leg   Seizure ([place])    last one 2015   Stroke ([facility]) 2012, 10/2017   2012-speech was effected- has come back.10/2017- numbness of right side no taste right  side. 2012-   Tobacco abuse     Family History  Problem Relation Age of Onset   Diabetes Mellitus II Mother    Diabetes  Mellitus II Father    Colon cancer Neg Hx    Esophageal cancer Neg Hx     Past Surgical History:  Procedure Laterality Date   AMPUTATION Right 02/23/2017   Procedure: RIGHT FOOT 5TH RAY AMPUTATION;  Surgeon: Newt Minion, MD;  Location: [place];  Service: Orthopedics;  Laterality: Right;   AMPUTATION Right 04/23/2018   Procedure: Right Great Toe  Amputation;  Surgeon: Newt Minion, MD;  Location: [place];  Service: Orthopedics;  Laterality: Right;   AMPUTATION Right 07/23/2018   Procedure: RIGHT TRANSMETATARSAL AMPUTATION, APPLY WOUND VAC;  Surgeon: Newt Minion, MD;  Location: [place];  Service: Orthopedics;  Laterality: Right;   AMPUTATION Right 09/17/2018   Procedure: RIGHT AMPUTATION BELOW KNEE;  Surgeon: Newt Minion, MD;  Location: [place];  Service: Orthopedics;  Laterality: Right;   APPLICATION OF WOUND VAC Right 09/17/2018   Procedure: Application Of Wound Vac;  Surgeon: Newt Minion, MD;  Location: [place];  Service: Orthopedics;  Laterality: Right;   BELOW KNEE LEG AMPUTATION Right 09/17/2018   CORONARY ANGIOPLASTY  03/05/2008   stent   FOOT SURGERY     PERIPHERAL ARTERIAL STENT GRAFT Right 2014   leg 2 stents- clotted per pat   STENT PLACEMENT VASCULAR ([place] HX)     Several in right leg   TONSILLECTOMY     TRANSMETATARSAL AMPUTATION Right 07/23/2018   Social History   Occupational History   Not on file  Tobacco Use   Smoking status: Light Smoker    Packs/day: 0.25    Years: 15.00    Total pack years: 3.75    Types: Cigarettes   Smokeless tobacco: Never   Tobacco comments:    2 or 3 a day   Vaping Use   Vaping Use: Never used  Substance and Sexual Activity   Alcohol use: Not Currently   Drug use: No   Sexual activity: Not on file

## 2022-09-04 ENCOUNTER — Ambulatory Visit: Payer: 59 | Admitting: Cardiovascular Disease

## 2022-10-01 ENCOUNTER — Ambulatory Visit (INDEPENDENT_AMBULATORY_CARE_PROVIDER_SITE_OTHER): Payer: 59 | Admitting: Orthopedic Surgery

## 2022-10-01 DIAGNOSIS — Z89511 Acquired absence of right leg below knee: Secondary | ICD-10-CM

## 2022-10-01 DIAGNOSIS — L97521 Non-pressure chronic ulcer of other part of left foot limited to breakdown of skin: Secondary | ICD-10-CM | POA: Diagnosis not present

## 2022-10-12 ENCOUNTER — Encounter: Payer: Self-pay | Admitting: Orthopedic Surgery

## 2022-10-12 NOTE — Progress Notes (Signed)
Office Visit Note   Patient: Curtis Bradford           Date of Birth: [birth year]           MRN: [id number] Visit Date: 10/01/2022              Requested by: Charlane Ferretti, DO [address] [place],  [place] [address] PCP: Charlane Ferretti, DO  Chief Complaint  Patient presents with   Left Foot - Follow-up      HPI: [name] a 62 year old gentleman with a chronic Wagner grade 1 ulcer of the left foot.  Patient states it started bleeding last week.  Patient states that his primary care physician gave him antibiotics Thursday.  Doxycycline twice a day.  Assessment & Plan: Visit Diagnoses:  1. Non-pressure chronic ulcer of other part of left foot limited to breakdown of skin   2. Acquired absence of right lower extremity below knee     Plan: Left foot ulcer was debrided continue routine wound care.  Follow-Up Instructions: Return in about 4 weeks (around 10/29/2022).   Ortho Exam  [name] alert, oriented, no adenopathy, well-dressed, normal affect, normal respiratory effort. Examination of the left foot patient has an ulcer beneath the first metatarsal head.  After informed consent a 10 blade knife was used to debride the skin and soft tissue back to healthy viable tissue.  After debridement the ulcer is 3 cm in diameter and 3 mm deep there is no exposed bone or tendon.  There is no cellulitis.  Imaging: No results found. No images are attached to the encounter.  Labs: Lab Results  Component Value Date   HGBA1C 6.9 (H) 07/24/2022   HGBA1C 7.5 (H) 09/17/2018   HGBA1C 8.3 (H) 07/23/2018   ESRSEDRATE 31 (H) 02/14/2017   CRP 12.3 (H) 02/14/2017   REPTSTATUS 03/13/2019 FINAL 03/11/2019   CULT  03/11/2019    NO GROWTH Performed at [facility], [address], [place], [place] [address]      Lab Results  Component Value Date   ALBUMIN 3.4 (L) 07/24/2022   ALBUMIN 4.3 07/23/2022   ALBUMIN 3.9 10/22/2018   PREALBUMIN 13.8 (L) 02/15/2017    Lab Results  Component  Value Date   MG 2.0 07/25/2022   No results found for: "VD25OH"  Lab Results  Component Value Date   PREALBUMIN 13.8 (L) 02/15/2017      Latest Ref Rng & Units 07/25/2022    6:15 AM 07/24/2022    4:42 AM 07/23/2022    9:27 PM  CBC EXTENDED  WBC 4.0 - 10.5 K/uL 11.4  12.0  11.4   RBC 4.22 - 5.81 MIL/uL 4.54  4.81  5.46   Hemoglobin 13.0 - 17.0 g/dL 82.9  56.2  13.0   HCT 39.0 - 52.0 % 40.9  44.2  49.2   Platelets 150 - 400 K/uL 258  287  301   NEUT# 1.7 - 7.7 K/uL 6.8   7.2   Lymph# 0.7 - 4.0 K/uL 2.9   2.7      There is no height or weight on file to calculate BMI.  Orders:  No orders of the defined types were placed in this encounter.  No orders of the defined types were placed in this encounter.    Procedures: No procedures performed  Clinical Data: No additional findings.  ROS:  All other systems negative, except as noted in the HPI. Review of Systems  Objective: Vital Signs: There were no  vitals taken for this visit.  Specialty Comments:  No specialty comments available.  PMFS History: Patient Active Problem List   Diagnosis Date Noted   Acute ischemic stroke 07/24/2022   CVA (cerebral vascular accident) 07/24/2022   Alkaline phosphatase raised 11/15/2020   Atherosclerosis of coronary artery without angina pectoris 11/15/2020   Benign prostatic hyperplasia with lower urinary tract symptoms 11/15/2020   Hyperglycemia 11/15/2020   Late effects of cerebrovascular disease 11/15/2020   Long term (current) use of insulin 11/15/2020   Melena 11/15/2020   Microalbuminuria 11/15/2020   Personal history of other specified conditions 11/15/2020   Right toe amputee 11/15/2020   Ulcer of left foot, limited to breakdown of skin 11/15/2020   Vitamin D deficiency 11/15/2020   S/P BKA (below knee amputation) unilateral, right 10/23/2018   Intermittent pain    Phantom limb pain    Stage 3a chronic kidney disease (CKD)    Labile blood glucose    Other disorders  of plasma-protein metabolism, not elsewhere classified 09/22/2018   Acute blood loss anemia    Hypoalbuminemia due to protein-calorie malnutrition    Diabetes mellitus type 2 in nonobese    Postoperative pain    Neuropathic pain    Seizures    Right below-knee amputee 09/19/2018   Below knee amputation 09/17/2018   Status post transmetatarsal amputation of foot, right 07/23/2018   Osteomyelitis 07/23/2018   TIA (transient ischemic attack) 10/30/2017   Medication monitoring encounter 03/28/2017   Status post amputation of toe of right foot 03/05/2017   Subacute osteomyelitis, right ankle and foot    Peripheral neuropathy 02/16/2017   Peripheral artery disease 02/16/2017   Type 2 diabetes mellitus with diabetic foot ulcer 02/14/2017   Diabetic foot infection 02/14/2017   Type II diabetes mellitus with renal manifestations    HLD (hyperlipidemia)    Tobacco abuse    CKD stage 2 due to type 2 diabetes mellitus    CAD (coronary artery disease)    Seizure disorder    Past Medical History:  Diagnosis Date   CAD (coronary artery disease)    CKD (chronic kidney disease), stage III    Diabetes mellitus without complication    Type II   History of DVT (deep vein thrombosis)    History of kidney stones    passed stones - no surgery   HLD (hyperlipidemia)    Myocardial infarction 2009   Peripheral vascular disease    right leg   Seizure    last one 2015   Stroke 2012, 10/2017   2012-speech was effected- has come back.10/2017- numbness of right side no taste right  side. 2012-   Tobacco abuse     Family History  Problem Relation Age of Onset   Diabetes Mellitus II Mother    Diabetes Mellitus II Father    Colon cancer Neg Hx    Esophageal cancer Neg Hx     Past Surgical History:  Procedure Laterality Date   AMPUTATION Right 02/23/2017   Procedure: RIGHT FOOT 5TH RAY AMPUTATION;  Surgeon: [name], MD;  Location: [facility] OR;  Service: Orthopedics;  Laterality: Right;   AMPUTATION  Right 04/23/2018   Procedure: Right Great Toe Amputation;  Surgeon: [name], MD;  Location[facility];  Service: Orthopedics;  Laterality: Right;   AMPUTATION Right 07/23/2018   Procedure: RIGHT TRANSMETATARSAL AMPUTATION, APPLY WOUND VAC;  Surgeon: [name], MD;  Location[facility] OR;  Service: Orthopedics;  Laterality: Right;   AMPUTATION Right  09/17/2018   Procedure: RIGHT AMPUTATION BELOW KNEE;  Surgeon: [name], MD;  Location: [facility] OR;  Service: Orthopedics;  Laterality: Right;   APPLICATION OF WOUND VAC Right 09/17/2018   Procedure: Application Of Wound Vac;  Surgeon: [name], MD;  Location: [facility] OR;  Service: Orthopedics;  Laterality: Right;   BELOW KNEE LEG AMPUTATION Right 09/17/2018   CORONARY ANGIOPLASTY  03/05/2008   stent   FOOT SURGERY     PERIPHERAL ARTERIAL STENT GRAFT Right 2014   leg 2 stents- clotted per pat   STENT PLACEMENT VASCULAR ([facility] HX)     Several in right leg   TONSILLECTOMY     TRANSMETATARSAL AMPUTATION Right 07/23/2018   Social History   Occupational History   Not on file  Tobacco Use   Smoking status: Light Smoker    Packs/day: 0.25    Years: 15.00    Additional pack years: 0.00    Total pack years: 3.75    Types: Cigarettes   Smokeless tobacco: Never   Tobacco comments:    2 or 3 a day   Vaping Use   Vaping Use: Never used  Substance and Sexual Activity   Alcohol use: Not Currently   Drug use: No   Sexual activity: Not on file

## 2022-10-29 ENCOUNTER — Ambulatory Visit (INDEPENDENT_AMBULATORY_CARE_PROVIDER_SITE_OTHER): Payer: 59 | Admitting: Orthopedic Surgery

## 2022-10-29 DIAGNOSIS — Z89511 Acquired absence of right leg below knee: Secondary | ICD-10-CM

## 2022-10-29 DIAGNOSIS — L97521 Non-pressure chronic ulcer of other part of left foot limited to breakdown of skin: Secondary | ICD-10-CM

## 2022-10-30 ENCOUNTER — Encounter: Payer: Self-pay | Admitting: Orthopedic Surgery

## 2022-10-30 NOTE — Progress Notes (Signed)
Office Visit Note   Patient: Curtis Bradford           Date of Birth: 1961-06-05           MRN: [id number] Visit Date: 10/29/2022              Requested by: Charlane Ferretti, DO [address] [place],  [place] [address] PCP: Charlane Ferretti, DO  Chief Complaint  Patient presents with   Left Foot - Wound Check      HPI: [name] a 62 year old gentleman who presents with chronic ulceration first metatarsal head left foot and a transtibial amputation on the right.  Patient states he has been having problems with subsiding into his socket and pain with weightbearing on the end of the residual limb.  Patient states his socket is 61 years old.  Assessment & Plan: Visit Diagnoses:  1. Non-pressure chronic ulcer of other part of left foot limited to breakdown of skin (HCC)   2. Acquired absence of right lower extremity below knee (HCC)     Plan: A prescription was provided for a new socket from [employer] with liner and materials.  The ulcer was debrided on the plantar aspect left foot.  Discussed that he should follow-up with [employer] to have the orthotics modified.  If we are unable to resolve the recurrent ulceration with modification to his orthotics discussed that we could proceed with a gastrocnemius recession and a dorsal closing wedge osteotomy of the first metatarsal.  Follow-Up Instructions: Return in about 4 weeks (around 11/26/2022).   Ortho Exam  [name] alert, oriented, no adenopathy, well-dressed, normal affect, normal respiratory effort. Examination patient has a cavovarus foot with a plantarflexed first ray with Achilles contracture with dorsiflexion just short of neutral.  After informed consent a 10 blade knife was used to debride the skin and soft tissue back to healthy viable granulation tissue.  Ulcer measures 3 cm in diameter and 1 mm deep after debridement.  There is no exposed bone or tendon.  Patient has a loosefitting prosthesis on the right no open ulcers.  [name] an  existing right transtibial  amputee.  Patient's current comorbidities are not expected to impact the ability to function with the prescribed prosthesis. Patient verbally communicates a strong desire to use a prosthesis. Patient currently requires mobility aids to ambulate without a prosthesis.  Expects not to use mobility aids with a new prosthesis.  [name] a K3 level ambulator that spends a lot of time walking around on uneven terrain over obstacles, up and down stairs, and ambulates with a variable cadence.     Imaging: No results found. No images are attached to the encounter.  Labs: Lab Results  Component Value Date   HGBA1C 6.9 (H) 07/24/2022   HGBA1C 7.5 (H) 09/17/2018   HGBA1C 8.3 (H) 07/23/2018   ESRSEDRATE 31 (H) 02/14/2017   CRP 12.3 (H) 02/14/2017   REPTSTATUS 03/13/2019 FINAL 03/11/2019   CULT  03/11/2019    NO GROWTH Performed at [facility], [address], [place], [place] [address]      Lab Results  Component Value Date   ALBUMIN 3.4 (L) 07/24/2022   ALBUMIN 4.3 07/23/2022   ALBUMIN 3.9 10/22/2018   PREALBUMIN 13.8 (L) 02/15/2017    Lab Results  Component Value Date   MG 2.0 07/25/2022   No results found for: "VD25OH"  Lab Results  Component Value Date   PREALBUMIN 13.8 (L) 02/15/2017      Latest  Ref Rng & Units 07/25/2022    6:15 AM 07/24/2022    4:42 AM 07/23/2022    9:27 PM  CBC EXTENDED  WBC 4.0 - 10.5 K/uL 11.4  12.0  11.4   RBC 4.22 - 5.81 MIL/uL 4.54  4.81  5.46   Hemoglobin 13.0 - 17.0 g/dL 16.1  09.6  04.5   HCT 39.0 - 52.0 % 40.9  44.2  49.2   Platelets 150 - 400 K/uL 258  287  301   NEUT# 1.7 - 7.7 K/uL 6.8   7.2   Lymph# 0.7 - 4.0 K/uL 2.9   2.7      There is no height or weight on file to calculate BMI.  Orders:  No orders of the defined types were placed in this encounter.  No orders of the defined types were placed in this encounter.    Procedures: No procedures performed  Clinical Data: No additional  findings.  ROS:  All other systems negative, except as noted in the HPI. Review of Systems  Objective: Vital Signs: There were no vitals taken for this visit.  Specialty Comments:  No specialty comments available.  PMFS History: Patient Active Problem List   Diagnosis Date Noted   Acute ischemic stroke (HCC) 07/24/2022   CVA (cerebral vascular accident) (HCC) 07/24/2022   Alkaline phosphatase raised 11/15/2020   Atherosclerosis of coronary artery without angina pectoris 11/15/2020   Benign prostatic hyperplasia with lower urinary tract symptoms 11/15/2020   Hyperglycemia 11/15/2020   Late effects of cerebrovascular disease 11/15/2020   Long term (current) use of insulin (HCC) 11/15/2020   Melena 11/15/2020   Microalbuminuria 11/15/2020   Personal history of other specified conditions 11/15/2020   Right toe amputee (HCC) 11/15/2020   Ulcer of left foot, limited to breakdown of skin (HCC) 11/15/2020   Vitamin D deficiency 11/15/2020   S/P BKA (below knee amputation) unilateral, right (HCC) 10/23/2018   Intermittent pain    Phantom limb pain (HCC)    Stage 3a chronic kidney disease (CKD) (HCC)    Labile blood glucose    Other disorders of plasma-protein metabolism, not elsewhere classified 09/22/2018   Acute blood loss anemia    Hypoalbuminemia due to protein-calorie malnutrition (HCC)    Diabetes mellitus type 2 in nonobese [facility])    Postoperative pain    Neuropathic pain    Seizures (HCC)    Right below-knee amputee (HCC) 09/19/2018   Below knee amputation (HCC) 09/17/2018   Status post transmetatarsal amputation of foot, right (HCC) 07/23/2018   Osteomyelitis (HCC) 07/23/2018   TIA (transient ischemic attack) 10/30/2017   Medication monitoring encounter 03/28/2017   Status post amputation of toe of right foot (HCC) 03/05/2017   Subacute osteomyelitis, right ankle and foot (HCC)    Peripheral neuropathy 02/16/2017   Peripheral artery disease (HCC) 02/16/2017   Type 2  diabetes mellitus with diabetic foot ulcer (HCC) 02/14/2017   Diabetic foot infection (HCC) 02/14/2017   Type II diabetes mellitus with renal manifestations (HCC)    HLD (hyperlipidemia)    Tobacco abuse    CKD stage 2 due to type 2 diabetes mellitus (HCC)    CAD (coronary artery disease)    Seizure disorder (HCC)    Past Medical History:  Diagnosis Date   CAD (coronary artery disease)    CKD (chronic kidney disease), stage III (HCC)    Diabetes mellitus without complication (HCC)    Type II   History of DVT (deep vein thrombosis)  History of kidney stones    passed stones - no surgery   HLD (hyperlipidemia)    Myocardial infarction [facility]) 2009   Peripheral vascular disease (HCC)    right leg   Seizure (HCC)    last one 2015   Stroke [facility]) 2012, 10/2017   2012-speech was effected- has come back.10/2017- numbness of right side no taste right  side. 2012-   Tobacco abuse     Family History  Problem Relation Age of Onset   Diabetes Mellitus II Mother    Diabetes Mellitus II Father    Colon cancer Neg Hx    Esophageal cancer Neg Hx     Past Surgical History:  Procedure Laterality Date   AMPUTATION Right 02/23/2017   Procedure: RIGHT FOOT 5TH RAY AMPUTATION;  Surgeon: [name], MD;  Location: [facility] OR;  Service: Orthopedics;  Laterality: Right;   AMPUTATION Right 04/23/2018   Procedure: Right Great Toe Amputation;  Surgeon: [name], MD;  Location: [facility] OR;  Service: Orthopedics;  Laterality: Right;   AMPUTATION Right 07/23/2018   Procedure: RIGHT TRANSMETATARSAL AMPUTATION, APPLY WOUND VAC;  Surgeon: [name], MD;  Location: [facility] OR;  Service: Orthopedics;  Laterality: Right;   AMPUTATION Right 09/17/2018   Procedure: RIGHT AMPUTATION BELOW KNEE;  Surgeon: [name], MD;  Location: [facility] OR;  Service: Orthopedics;  Laterality: Right;   APPLICATION OF WOUND VAC Right 09/17/2018   Procedure: Application Of Wound Vac;  Surgeon: [name], MD;  Location: [facility] OR;   Service: Orthopedics;  Laterality: Right;   BELOW KNEE LEG AMPUTATION Right 09/17/2018   CORONARY ANGIOPLASTY  03/05/2008   stent   FOOT SURGERY     PERIPHERAL ARTERIAL STENT GRAFT Right 2014   leg 2 stents- clotted per pat   STENT PLACEMENT VASCULAR ([facility] HX)     Several in right leg   TONSILLECTOMY     TRANSMETATARSAL AMPUTATION Right 07/23/2018   Social History   Occupational History   Not on file  Tobacco Use   Smoking status: Light Smoker    Packs/day: 0.25    Years: 15.00    Additional pack years: 0.00    Total pack years: 3.75    Types: Cigarettes   Smokeless tobacco: Never   Tobacco comments:    2 or 3 a day   Vaping Use   Vaping Use: Never used  Substance and Sexual Activity   Alcohol use: Not Currently   Drug use: No   Sexual activity: Not on file

## 2022-12-03 ENCOUNTER — Ambulatory Visit (INDEPENDENT_AMBULATORY_CARE_PROVIDER_SITE_OTHER): Payer: 59 | Admitting: Orthopedic Surgery

## 2022-12-03 DIAGNOSIS — L97521 Non-pressure chronic ulcer of other part of left foot limited to breakdown of skin: Secondary | ICD-10-CM | POA: Diagnosis not present

## 2022-12-03 DIAGNOSIS — Z89511 Acquired absence of right leg below knee: Secondary | ICD-10-CM | POA: Diagnosis not present

## 2022-12-04 ENCOUNTER — Encounter: Payer: Self-pay | Admitting: Orthopedic Surgery

## 2022-12-04 NOTE — Progress Notes (Signed)
Office Visit Note   Patient: Curtis Bradford           Date of Birth: 21-Oct-1960           MRN: [id number] Visit Date: 12/03/2022              Requested by: Charlane Ferretti, DO [address] [place],  [place] [address] PCP: Charlane Ferretti, DO  Chief Complaint  Patient presents with   Left Foot - Follow-up      HPI: [name] a 62 year old gentleman who presents for 2 separate issues.  #1 he has persistent ulceration beneath the first and fifth metatarsal heads of the cavovarus foot on the left.  [name] developing and bearing ulceration in his right residual limb with a loose fitting prosthetic socket and tearing of the liner.  Assessment & Plan: Visit Diagnoses:  1. Non-pressure chronic ulcer of other part of left foot limited to breakdown of skin (HCC)   2. Acquired absence of right lower extremity below knee [facility])     Plan: [name] provided a prescription for a new K3 level prosthesis.  Patient will need an upgraded foot and ankle due to his current instability and will need a new socket and liner due to loss of residual volume.  Follow-Up Instructions: Return in about 4 weeks (around 12/31/2022).   Ortho Exam  [name] alert, oriented, no adenopathy, well-dressed, normal affect, normal respiratory effort. Examination of the left foot there is a Wagner grade 1 ulcer beneath the first and fifth metatarsal head secondary to the cavovarus pressure across the forefoot.  After informed consent a 10 blade knife was used to debride the skin and soft tissue back to healthy viable tissue.  The first metatarsal head ulcer is 3 cm in diameter 2 mm deep and the fifth metatarsal head ulcer is 2 cm in diameter and 1 mm deep.  Examination patient has fluid and callus over the right residual limb.  No cellulitis no open wounds.  The prosthetic foot and ankle is unstable.  [name] an existing right transtibial  amputee.  Patient's current comorbidities are not expected to impact the  ability to function with the prescribed prosthesis. Patient verbally communicates a strong desire to use a prosthesis. Patient currently requires mobility aids to ambulate without a prosthesis.  Expects not to use mobility aids with a new prosthesis.  [name] a K3 level ambulator that spends a lot of time walking around on uneven terrain over obstacles, up and down stairs, and ambulates with a variable cadence.     Imaging: No results found. No images are attached to the encounter.  Labs: Lab Results  Component Value Date   HGBA1C 6.9 (H) 07/24/2022   HGBA1C 7.5 (H) 09/17/2018   HGBA1C 8.3 (H) 07/23/2018   ESRSEDRATE 31 (H) 02/14/2017   CRP 12.3 (H) 02/14/2017   REPTSTATUS 03/13/2019 FINAL 03/11/2019   CULT  03/11/2019    NO GROWTH Performed at [facility], [address] [place], [place] [address]      Lab Results  Component Value Date   ALBUMIN 3.4 (L) 07/24/2022   ALBUMIN 4.3 07/23/2022   ALBUMIN 3.9 10/22/2018   PREALBUMIN 13.8 (L) 02/15/2017    Lab Results  Component Value Date   MG 2.0 07/25/2022   No results found for: "VD25OH"  Lab Results  Component Value Date   PREALBUMIN 13.8 (L) 02/15/2017      Latest Ref Rng & Units 07/25/2022  6:15 AM 07/24/2022    4:42 AM 07/23/2022    9:27 PM  CBC EXTENDED  WBC 4.0 - 10.5 K/uL 11.4  12.0  11.4   RBC 4.22 - 5.81 MIL/uL 4.54  4.81  5.46   Hemoglobin 13.0 - 17.0 g/dL 16.1  09.6  04.5   HCT 39.0 - 52.0 % 40.9  44.2  49.2   Platelets 150 - 400 K/uL 258  287  301   NEUT# 1.7 - 7.7 K/uL 6.8   7.2   Lymph# 0.7 - 4.0 K/uL 2.9   2.7      There is no height or weight on file to calculate BMI.  Orders:  No orders of the defined types were placed in this encounter.  No orders of the defined types were placed in this encounter.    Procedures: No procedures performed  Clinical Data: No additional findings.  ROS:  All other systems negative, except as noted in the HPI. Review of  Systems  Objective: Vital Signs: There were no vitals taken for this visit.  Specialty Comments:  No specialty comments available.  PMFS History: Patient Active Problem List   Diagnosis Date Noted   Acute ischemic stroke (HCC) 07/24/2022   CVA (cerebral vascular accident) (HCC) 07/24/2022   Alkaline phosphatase raised 11/15/2020   Atherosclerosis of coronary artery without angina pectoris 11/15/2020   Benign prostatic hyperplasia with lower urinary tract symptoms 11/15/2020   Hyperglycemia 11/15/2020   Late effects of cerebrovascular disease 11/15/2020   Long term (current) use of insulin (HCC) 11/15/2020   Melena 11/15/2020   Microalbuminuria 11/15/2020   Personal history of other specified conditions 11/15/2020   Right toe amputee (HCC) 11/15/2020   Ulcer of left foot, limited to breakdown of skin (HCC) 11/15/2020   Vitamin D deficiency 11/15/2020   S/P BKA (below knee amputation) unilateral, right (HCC) 10/23/2018   Intermittent pain    Phantom limb pain (HCC)    Stage 3a chronic kidney disease (CKD) (HCC)    Labile blood glucose    Other disorders of plasma-protein metabolism, not elsewhere classified 09/22/2018   Acute blood loss anemia    Hypoalbuminemia due to protein-calorie malnutrition (HCC)    Diabetes mellitus type 2 in nonobese [facility])    Postoperative pain    Neuropathic pain    Seizures (HCC)    Right below-knee amputee (HCC) 09/19/2018   Below knee amputation (HCC) 09/17/2018   Status post transmetatarsal amputation of foot, right (HCC) 07/23/2018   Osteomyelitis (HCC) 07/23/2018   TIA (transient ischemic attack) 10/30/2017   Medication monitoring encounter 03/28/2017   Status post amputation of toe of right foot (HCC) 03/05/2017   Subacute osteomyelitis, right ankle and foot (HCC)    Peripheral neuropathy 02/16/2017   Peripheral artery disease (HCC) 02/16/2017   Type 2 diabetes mellitus with diabetic foot ulcer (HCC) 02/14/2017   Diabetic foot infection  (HCC) 02/14/2017   Type II diabetes mellitus with renal manifestations (HCC)    HLD (hyperlipidemia)    Tobacco abuse    CKD stage 2 due to type 2 diabetes mellitus (HCC)    CAD (coronary artery disease)    Seizure disorder (HCC)    Past Medical History:  Diagnosis Date   CAD (coronary artery disease)    CKD (chronic kidney disease), stage III (HCC)    Diabetes mellitus without complication (HCC)    Type II   History of DVT (deep vein thrombosis)    History of kidney stones    passed  stones - no surgery   HLD (hyperlipidemia)    Myocardial infarction [facility]) 2009   Peripheral vascular disease (HCC)    right leg   Seizure (HCC)    last one 2015   Stroke [facility]) 2012, 10/2017   2012-speech was effected- has come back.10/2017- numbness of right side no taste right  side. 2012-   Tobacco abuse     Family History  Problem Relation Age of Onset   Diabetes Mellitus II Mother    Diabetes Mellitus II Father    Colon cancer Neg Hx    Esophageal cancer Neg Hx     Past Surgical History:  Procedure Laterality Date   AMPUTATION Right 02/23/2017   Procedure: RIGHT FOOT 5TH RAY AMPUTATION;  Surgeon: [name], MD;  Location: [facility] OR;  Service: Orthopedics;  Laterality: Right;   AMPUTATION Right 04/23/2018   Procedure: Right Great Toe Amputation;  Surgeon: [name], MD;  Location: [facility] OR;  Service: Orthopedics;  Laterality: Right;   AMPUTATION Right 07/23/2018   Procedure: RIGHT TRANSMETATARSAL AMPUTATION, APPLY WOUND VAC;  Surgeon: [name], MD;  Location: [facility] OR;  Service: Orthopedics;  Laterality: Right;   AMPUTATION Right 09/17/2018   Procedure: RIGHT AMPUTATION BELOW KNEE;  Surgeon: [name], MD;  Location: [facility] OR;  Service: Orthopedics;  Laterality: Right;   APPLICATION OF WOUND VAC Right 09/17/2018   Procedure: Application Of Wound Vac;  Surgeon: [name], MD;  Location: [facility] OR;  Service: Orthopedics;  Laterality: Right;   BELOW KNEE LEG AMPUTATION Right 09/17/2018    CORONARY ANGIOPLASTY  03/05/2008   stent   FOOT SURGERY     PERIPHERAL ARTERIAL STENT GRAFT Right 2014   leg 2 stents- clotted per pat   STENT PLACEMENT VASCULAR ([facility] HX)     Several in right leg   TONSILLECTOMY     TRANSMETATARSAL AMPUTATION Right 07/23/2018   Social History   Occupational History   Not on file  Tobacco Use   Smoking status: Light Smoker    Packs/day: 0.25    Years: 15.00    Additional pack years: 0.00    Total pack years: 3.75    Types: Cigarettes   Smokeless tobacco: Never   Tobacco comments:    2 or 3 a day   Vaping Use   Vaping Use: Never used  Substance and Sexual Activity   Alcohol use: Not Currently   Drug use: No   Sexual activity: Not on file

## 2022-12-31 ENCOUNTER — Ambulatory Visit (INDEPENDENT_AMBULATORY_CARE_PROVIDER_SITE_OTHER): Payer: 59 | Admitting: Orthopedic Surgery

## 2022-12-31 DIAGNOSIS — L97521 Non-pressure chronic ulcer of other part of left foot limited to breakdown of skin: Secondary | ICD-10-CM | POA: Diagnosis not present

## 2022-12-31 DIAGNOSIS — Z89511 Acquired absence of right leg below knee: Secondary | ICD-10-CM

## 2023-01-01 ENCOUNTER — Encounter: Payer: Self-pay | Admitting: Orthopedic Surgery

## 2023-01-01 NOTE — Progress Notes (Signed)
Office Visit Note   Patient: Curtis Bradford           Date of Birth: [birth year]           MRN: [id number] Visit Date: 12/31/2022              Requested by: Charlane Ferretti, DO [address] [place],  [place] [address] PCP: Charlane Ferretti, DO  No chief complaint on file.     HPI: [name] a 62 year old gentleman status post right below-knee amputation and ulceration left foot.  Patient currently has a new socket and parts for the right prosthesis.  Patient has persistent ulceration beneath the left first metatarsal head.  Assessment & Plan: Visit Diagnoses:  1. 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 Non-pressure chronic ulcer of other part of left foot limited to breakdown of skin (HCC)     Plan: Ulcer was debrided continue with protective shoe wear and dressing changes.  Follow-Up Instructions: Return in about 4 weeks (around 01/28/2023).   Ortho Exam  [name] alert, oriented, no adenopathy, well-dressed, normal affect, normal respiratory effort. Examination patient has a cavovarus foot on the left.  Plantarflexed first ray with an ulcer beneath the first metatarsal head.  After informed consent a 10 blade knife was used to debride the skin and soft tissue back to healthy viable granulation tissue.  There is no exposed bone or tendon.  The ulcer is 2 cm in diameter and 2 mm deep after debridement.  Imaging: No results found. No images are attached to the encounter.  Labs: Lab Results  Component Value Date   HGBA1C 6.9 (H) 07/24/2022   HGBA1C 7.5 (H) 09/17/2018   HGBA1C 8.3 (H) 07/23/2018   ESRSEDRATE 31 (H) 02/14/2017   CRP 12.3 (H) 02/14/2017   REPTSTATUS 03/13/2019 FINAL 03/11/2019   CULT  03/11/2019    NO GROWTH Performed at [facility], [address] [place], [place] [address]      Lab Results  Component Value Date   ALBUMIN 3.4 (L) 07/24/2022   ALBUMIN 4.3 07/23/2022   ALBUMIN 3.9 10/22/2018   PREALBUMIN 13.8 (L)  02/15/2017    Lab Results  Component Value Date   MG 2.0 07/25/2022   No results found for: "VD25OH"  Lab Results  Component Value Date   PREALBUMIN 13.8 (L) 02/15/2017      Latest Ref Rng & Units 07/25/2022    6:15 AM 07/24/2022    4:42 AM 07/23/2022    9:27 PM  CBC EXTENDED  WBC 4.0 - 10.5 K/uL 11.4  12.0  11.4   RBC 4.22 - 5.81 MIL/uL 4.54  4.81  5.46   Hemoglobin 13.0 - 17.0 g/dL 65.7  84.6  96.2   HCT 39.0 - 52.0 % 40.9  44.2  49.2   Platelets 150 - 400 K/uL 258  287  301   NEUT# 1.7 - 7.7 K/uL 6.8   7.2   Lymph# 0.7 - 4.0 K/uL 2.9   2.7      There is no height or weight on file to calculate BMI.  Orders:  No orders of the defined types were placed in this encounter.  No orders of the defined types were placed in this encounter.    Procedures: No procedures performed  Clinical Data: No additional findings.  ROS:  All other systems negative, except as noted in the HPI. Review of Systems  Objective: Vital Signs: There were no vitals taken for  this visit.  Specialty Comments:  No specialty comments available.  PMFS History: Patient Active Problem List   Diagnosis Date Noted   Acute ischemic stroke (HCC) 07/24/2022   CVA (cerebral vascular accident) (HCC) 07/24/2022   Alkaline phosphatase raised 11/15/2020   Atherosclerosis of coronary artery without angina pectoris 11/15/2020   Benign prostatic hyperplasia with lower urinary tract symptoms 11/15/2020   Hyperglycemia 11/15/2020   Late effects of cerebrovascular disease 11/15/2020   Long term (current) use of insulin (HCC) 11/15/2020   Melena 11/15/2020   Microalbuminuria 11/15/2020   Personal history of other specified conditions 11/15/2020   Right toe amputee (HCC) 11/15/2020   Ulcer of left foot, limited to breakdown of skin (HCC) 11/15/2020   Vitamin D deficiency 11/15/2020   S/P BKA (below knee amputation) unilateral, right (HCC) 10/23/2018   Intermittent pain    Phantom limb pain (HCC)     Stage 3a chronic kidney disease (CKD) (HCC)    Labile blood glucose    Other disorders of plasma-protein metabolism, not elsewhere classified 09/22/2018   Acute blood loss anemia    Hypoalbuminemia due to protein-calorie malnutrition (HCC)    Diabetes mellitus type 2 in nonobese [facility])    Postoperative pain    Neuropathic pain    Seizures (HCC)    Right below-knee amputee (HCC) 09/19/2018   Below knee amputation (HCC) 09/17/2018   Status post transmetatarsal amputation of foot, right (HCC) 07/23/2018   Osteomyelitis (HCC) 07/23/2018   TIA (transient ischemic attack) 10/30/2017   Medication monitoring encounter 03/28/2017   Status post amputation of toe of right foot (HCC) 03/05/2017   Subacute osteomyelitis, right ankle and foot (HCC)    Peripheral neuropathy 02/16/2017   Peripheral artery disease (HCC) 02/16/2017   Type 2 diabetes mellitus with diabetic foot ulcer (HCC) 02/14/2017   Diabetic foot infection (HCC) 02/14/2017   Type II diabetes mellitus with renal manifestations (HCC)    HLD (hyperlipidemia)    Tobacco abuse    CKD stage 2 due to type 2 diabetes mellitus (HCC)    CAD (coronary artery disease)    Seizure disorder (HCC)    Past Medical History:  Diagnosis Date   CAD (coronary artery disease)    CKD (chronic kidney disease), stage III (HCC)    Diabetes mellitus without complication (HCC)    Type II   History of DVT (deep vein thrombosis)    History of kidney stones    passed stones - no surgery   HLD (hyperlipidemia)    Myocardial infarction (HCC) 2009   Peripheral vascular disease (HCC)    right leg   Seizure (HCC)    last one 2015   Stroke (HCC) 2012, 10/2017   2012-speech was effected- has come back.10/2017- numbness of right side no taste right  side. 2012-   Tobacco abuse     Family History  Problem Relation Age of Onset   Diabetes Mellitus II Mother    Diabetes Mellitus II Father    Colon cancer Neg Hx    Esophageal cancer Neg Hx     Past Surgical  History:  Procedure Laterality Date   AMPUTATION Right 02/23/2017   Procedure: RIGHT FOOT 5TH RAY AMPUTATION;  Surgeon: [name], MD;  Location: [facility] OR;  Service: Orthopedics;  Laterality: Right;   AMPUTATION Right 04/23/2018   Procedure: Right Great Toe Amputation;  Surgeon: [name], MD;  Location: [facility] OR;  Service: Orthopedics;  Laterality: Right;   AMPUTATION Right 07/23/2018   Procedure:  RIGHT TRANSMETATARSAL AMPUTATION, APPLY WOUND VAC;  Surgeon: [name], MD;  Location: [facility] OR;  Service: Orthopedics;  Laterality: Right;   AMPUTATION Right 09/17/2018   Procedure: RIGHT AMPUTATION BELOW KNEE;  Surgeon: [name], MD;  Location: [facility] OR;  Service: Orthopedics;  Laterality: Right;   APPLICATION OF WOUND VAC Right 09/17/2018   Procedure: Application Of Wound Vac;  Surgeon: [name], MD;  Location: [facility] OR;  Service: Orthopedics;  Laterality: Right;   BELOW KNEE LEG AMPUTATION Right 09/17/2018   CORONARY ANGIOPLASTY  03/05/2008   stent   FOOT SURGERY     PERIPHERAL ARTERIAL STENT GRAFT Right 2014   leg 2 stents- clotted per pat   STENT PLACEMENT VASCULAR ([facility] HX)     Several in right leg   TONSILLECTOMY     TRANSMETATARSAL AMPUTATION Right 07/23/2018   Social History   Occupational History   Not on file  Tobacco Use   Smoking status: Light Smoker    Packs/day: 0.25    Years: 15.00    Additional pack years: 0.00    Total pack years: 3.75    Types: Cigarettes   Smokeless tobacco: Never   Tobacco comments:    2 or 3 a day   Vaping Use   Vaping Use: Never used  Substance and Sexual Activity   Alcohol use: Not Currently   Drug use: No   Sexual activity: Not on file

## 2023-01-28 ENCOUNTER — Ambulatory Visit (INDEPENDENT_AMBULATORY_CARE_PROVIDER_SITE_OTHER): Payer: 59 | Admitting: Orthopedic Surgery

## 2023-01-28 DIAGNOSIS — Z89511 Acquired absence of right leg below knee: Secondary | ICD-10-CM | POA: Diagnosis not present

## 2023-01-28 DIAGNOSIS — L97521 Non-pressure chronic ulcer of other part of left foot limited to breakdown of skin: Secondary | ICD-10-CM

## 2023-01-29 ENCOUNTER — Encounter: Payer: Self-pay | Admitting: Orthopedic Surgery

## 2023-01-29 NOTE — Progress Notes (Signed)
Office Visit Note   Patient: Curtis Bradford           Date of Birth: [birth year]           MRN: [id number] Visit Date: 01/28/2023              Requested by: Charlane Ferretti, DO [address] [place],  [place] [address] PCP: Charlane Ferretti, DO  Chief Complaint  Patient presents with   Left Foot - Follow-up      HPI: [name] a 62 year old gentleman presents for follow-up for [facility] grade 1 ulcer left foot first metatarsal head.  Patient has a right transtibial amputation and is in the process of obtaining a new socket on the right.  Assessment & Plan: Visit Diagnoses:  1. Non-pressure chronic ulcer of other part of left foot limited to breakdown of skin (HCC)   2. Acquired absence of right lower extremity below knee (HCC)     Plan: Ulcer was debrided back to healthy granulation tissue.  Patient does have a plantarflexed first ray.  May need to consider a dorsal wedge osteotomy to the first ray.  Follow-Up Instructions: Return in about 4 weeks (around 02/25/2023).   Ortho Exam  [name] alert, oriented, no adenopathy, well-dressed, normal affect, normal respiratory effort. Examination patient does have a cavovarus foot on the left with a plantarflexed first ray.  He has a Wagner grade 1 ulcer beneath the first metatarsal head.  After informed consent a 10 blade knife was used to debride the skin and soft tissue back to healthy viable tissue.  The wound area was 3 cm in diameter after debridement.  There is healthy granulation tissue this does not probe to bone no purulence no abscess.  Imaging: No results found. No images are attached to the encounter.  Labs: Lab Results  Component Value Date   HGBA1C 6.9 (H) 07/24/2022   HGBA1C 7.5 (H) 09/17/2018   HGBA1C 8.3 (H) 07/23/2018   ESRSEDRATE 31 (H) 02/14/2017   CRP 12.3 (H) 02/14/2017   REPTSTATUS 03/13/2019 FINAL 03/11/2019   CULT  03/11/2019    NO GROWTH Performed at [facility], [address], [place], [place]  [address]      Lab Results  Component Value Date   ALBUMIN 3.4 (L) 07/24/2022   ALBUMIN 4.3 07/23/2022   ALBUMIN 3.9 10/22/2018   PREALBUMIN 13.8 (L) 02/15/2017    Lab Results  Component Value Date   MG 2.0 07/25/2022   No results found for: "VD25OH"  Lab Results  Component Value Date   PREALBUMIN 13.8 (L) 02/15/2017      Latest Ref Rng & Units 07/25/2022    6:15 AM 07/24/2022    4:42 AM 07/23/2022    9:27 PM  CBC EXTENDED  WBC 4.0 - 10.5 K/uL 11.4  12.0  11.4   RBC 4.22 - 5.81 MIL/uL 4.54  4.81  5.46   Hemoglobin 13.0 - 17.0 g/dL 65.7  84.6  96.2   HCT 39.0 - 52.0 % 40.9  44.2  49.2   Platelets 150 - 400 K/uL 258  287  301   NEUT# 1.7 - 7.7 K/uL 6.8   7.2   Lymph# 0.7 - 4.0 K/uL 2.9   2.7      There is no height or weight on file to calculate BMI.  Orders:  No orders of the defined types were placed in this encounter.  No orders of the defined types were placed in this encounter.  Procedures: No procedures performed  Clinical Data: No additional findings.  ROS:  All other systems negative, except as noted in the HPI. Review of Systems  Objective: Vital Signs: There were no vitals taken for this visit.  Specialty Comments:  No specialty comments available.  PMFS History: Patient Active Problem List   Diagnosis Date Noted   Acute ischemic stroke (HCC) 07/24/2022   CVA (cerebral vascular accident) (HCC) 07/24/2022   Alkaline phosphatase raised 11/15/2020   Atherosclerosis of coronary artery without angina pectoris 11/15/2020   Benign prostatic hyperplasia with lower urinary tract symptoms 11/15/2020   Hyperglycemia 11/15/2020   Late effects of cerebrovascular disease 11/15/2020   Long term (current) use of insulin (HCC) 11/15/2020   Melena 11/15/2020   Microalbuminuria 11/15/2020   Personal history of other specified conditions 11/15/2020   Right toe amputee (HCC) 11/15/2020   Ulcer of left foot, limited to breakdown of skin (HCC) 11/15/2020    Vitamin D deficiency 11/15/2020   S/P BKA (below knee amputation) unilateral, right (HCC) 10/23/2018   Intermittent pain    Phantom limb pain (HCC)    Stage 3a chronic kidney disease (CKD) (HCC)    Labile blood glucose    Other disorders of plasma-protein metabolism, not elsewhere classified 09/22/2018   Acute blood loss anemia    Hypoalbuminemia due to protein-calorie malnutrition (HCC)    Diabetes mellitus type 2 in nonobese [facility])    Postoperative pain    Neuropathic pain    Seizures (HCC)    Right below-knee amputee (HCC) 09/19/2018   Below knee amputation (HCC) 09/17/2018   Status post transmetatarsal amputation of foot, right (HCC) 07/23/2018   Osteomyelitis (HCC) 07/23/2018   TIA (transient ischemic attack) 10/30/2017   Medication monitoring encounter 03/28/2017   Status post amputation of toe of right foot (HCC) 03/05/2017   Subacute osteomyelitis, right ankle and foot (HCC)    Peripheral neuropathy 02/16/2017   Peripheral artery disease (HCC) 02/16/2017   Type 2 diabetes mellitus with diabetic foot ulcer (HCC) 02/14/2017   Diabetic foot infection (HCC) 02/14/2017   Type II diabetes mellitus with renal manifestations (HCC)    HLD (hyperlipidemia)    Tobacco abuse    CKD stage 2 due to type 2 diabetes mellitus (HCC)    CAD (coronary artery disease)    Seizure disorder (HCC)    Past Medical History:  Diagnosis Date   CAD (coronary artery disease)    CKD (chronic kidney disease), stage III (HCC)    Diabetes mellitus without complication (HCC)    Type II   History of DVT (deep vein thrombosis)    History of kidney stones    passed stones - no surgery   HLD (hyperlipidemia)    Myocardial infarction (HCC) 2009   Peripheral vascular disease (HCC)    right leg   Seizure (HCC)    last one 2015   Stroke (HCC) 2012, 10/2017   2012-speech was effected- has come back.10/2017- numbness of right side no taste right  side. 2012-   Tobacco abuse     Family History  Problem  Relation Age of Onset   Diabetes Mellitus II Mother    Diabetes Mellitus II Father    Colon cancer Neg Hx    Esophageal cancer Neg Hx     Past Surgical History:  Procedure Laterality Date   AMPUTATION Right 02/23/2017   Procedure: RIGHT FOOT 5TH RAY AMPUTATION;  Surgeon: [name], MD;  Location: [facility] OR;  Service: Orthopedics;  Laterality: Right;  AMPUTATION Right 04/23/2018   Procedure: Right Great Toe Amputation;  Surgeon: [name], MD;  Location: [facility] OR;  Service: Orthopedics;  Laterality: Right;   AMPUTATION Right 07/23/2018   Procedure: RIGHT TRANSMETATARSAL AMPUTATION, APPLY WOUND VAC;  Surgeon: [name], MD;  Location: [facility] OR;  Service: Orthopedics;  Laterality: Right;   AMPUTATION Right 09/17/2018   Procedure: RIGHT AMPUTATION BELOW KNEE;  Surgeon: [name], MD;  Location: [facility] OR;  Service: Orthopedics;  Laterality: Right;   APPLICATION OF WOUND VAC Right 09/17/2018   Procedure: Application Of Wound Vac;  Surgeon: [name], MD;  Location[facility] OR;  Service: Orthopedics;  Laterality: Right;   BELOW KNEE LEG AMPUTATION Right 09/17/2018   CORONARY ANGIOPLASTY  03/05/2008   stent   FOOT SURGERY     PERIPHERAL ARTERIAL STENT GRAFT Right 2014   leg 2 stents- clotted per pat   STENT PLACEMENT VASCULAR ([facility] HX)     Several in right leg   TONSILLECTOMY     TRANSMETATARSAL AMPUTATION Right 07/23/2018   Social History   Occupational History   Not on file  Tobacco Use   Smoking status: Light Smoker    Current packs/day: 0.25    Average packs/day: 0.3 packs/day for 15.0 years (3.8 ttl pk-yrs)    Types: Cigarettes   Smokeless tobacco: Never   Tobacco comments:    2 or 3 a day   Vaping Use   Vaping status: Never Used  Substance and Sexual Activity   Alcohol use: Not Currently   Drug use: No   Sexual activity: Not on file

## 2023-03-04 ENCOUNTER — Encounter: Payer: Self-pay | Admitting: Orthopedic Surgery

## 2023-03-04 ENCOUNTER — Ambulatory Visit (INDEPENDENT_AMBULATORY_CARE_PROVIDER_SITE_OTHER): Payer: 59 | Admitting: Orthopedic Surgery

## 2023-03-04 DIAGNOSIS — Z89511 Acquired absence of right leg below knee: Secondary | ICD-10-CM

## 2023-03-04 DIAGNOSIS — L97521 Non-pressure chronic ulcer of other part of left foot limited to breakdown of skin: Secondary | ICD-10-CM | POA: Diagnosis not present

## 2023-03-04 NOTE — Progress Notes (Signed)
Office Visit Note   Patient: Curtis Bradford           Date of Birth: [birth year]           MRN: [id number] Visit Date: 03/04/2023              Requested by: Charlane Ferretti, DO [address] [place],  [place] [address] PCP: Charlane Ferretti, DO  Chief Complaint  Patient presents with   Left Foot - Wound Check      HPI: [name] a 62 year old gentleman status post right below-knee amputation who has had ulceration beneath the first and fifth metatarsal head left foot.  Patient has a plantarflexed first ray that is ulcerating the first metatarsal head and placing a lateral stress beneath the fifth metatarsal head.  Assessment & Plan: Visit Diagnoses:  1. Non-pressure chronic ulcer of other part of left foot limited to breakdown of skin (HCC)   2. 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 History of transmetatarsal amputation of left foot (HCC)     Plan: Ulcers were debrided reevaluate in 4 weeks.  Recommended proceeding with a dorsiflexion osteotomy of the first metatarsal to unload the first metatarsal and to unload pressure from the fifth metatarsal.  Follow-Up Instructions: Return in about 4 weeks (around 04/01/2023).   Ortho Exam  [name] alert, oriented, no adenopathy, well-dressed, normal affect, normal respiratory effort. Examination patient has a Wagner grade 1 ulcer beneath the first and fifth metatarsal head left foot.  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  After informed consent a 10 blade knife was used to debride the skin and soft tissue back to healthy viable tissue beneath the first and fifth metatarsal head.  The first metatarsal head ulcer is 3 cm in diameter after debridement the fifth metatarsal head ulcer is 2 cm in diameter after debridement.  Imaging: No results found. No images are attached to the encounter.  Labs: Lab Results   Component Value Date   HGBA1C 6.9 (H) 07/24/2022   HGBA1C 7.5 (H) 09/17/2018   HGBA1C 8.3 (H) 07/23/2018   ESRSEDRATE 31 (H) 02/14/2017   CRP 12.3 (H) 02/14/2017   REPTSTATUS 03/13/2019 FINAL 03/11/2019   CULT  03/11/2019    NO GROWTH Performed at [facility], [address] [place], [place] [address]      Lab Results  Component Value Date   ALBUMIN 3.4 (L) 07/24/2022   ALBUMIN 4.3 07/23/2022   ALBUMIN 3.9 10/22/2018   PREALBUMIN 13.8 (L) 02/15/2017    Lab Results  Component Value Date   MG 2.0 07/25/2022   No results found for: "VD25OH"  Lab Results  Component Value Date   PREALBUMIN 13.8 (L) 02/15/2017      Latest Ref Rng & Units 07/25/2022    6:15 AM 07/24/2022    4:42 AM 07/23/2022    9:27 PM  CBC EXTENDED  WBC 4.0 - 10.5 K/uL 11.4  12.0  11.4   RBC 4.22 - 5.81 MIL/uL 4.54  4.81  5.46   Hemoglobin 13.0 - 17.0 g/dL 41.3  24.4  01.0   HCT 39.0 - 52.0 % 40.9  44.2  49.2   Platelets 150 - 400 K/uL 258  287  301   NEUT# 1.7 - 7.7 K/uL 6.8   7.2   Lymph# 0.7 - 4.0 K/uL 2.9   2.7  There is no height or weight on file to calculate BMI.  Orders:  No orders of the defined types were placed in this encounter.  No orders of the defined types were placed in this encounter.    Procedures: No procedures performed  Clinical Data: No additional findings.  ROS:  All other systems negative, except as noted in the HPI. Review of Systems  Objective: Vital Signs: There were no vitals taken for this visit.  Specialty Comments:  No specialty comments available.  PMFS History: Patient Active Problem List   Diagnosis Date Noted   Acute ischemic stroke (HCC) 07/24/2022   CVA (cerebral vascular accident) (HCC) 07/24/2022   Alkaline phosphatase raised 11/15/2020   Atherosclerosis of coronary artery without angina pectoris 11/15/2020   Benign prostatic hyperplasia with lower urinary tract symptoms 11/15/2020   Hyperglycemia 11/15/2020   Late effects  of cerebrovascular disease 11/15/2020   Long term (current) use of insulin (HCC) 11/15/2020   Melena 11/15/2020   Microalbuminuria 11/15/2020   Personal history of other specified conditions 11/15/2020   Right toe amputee (HCC) 11/15/2020   Ulcer of left foot, limited to breakdown of skin (HCC) 11/15/2020   Vitamin D deficiency 11/15/2020   S/P BKA (below knee amputation) unilateral, right (HCC) 10/23/2018   Intermittent pain    Phantom limb pain (HCC)    Stage 3a chronic kidney disease (CKD) (HCC)    Labile blood glucose    Other disorders of plasma-protein metabolism, not elsewhere classified 09/22/2018   Acute blood loss anemia    Hypoalbuminemia due to protein-calorie malnutrition (HCC)    Diabetes mellitus type 2 in nonobese [facility])    Postoperative pain    Neuropathic pain    Seizures (HCC)    Right below-knee amputee (HCC) 09/19/2018   Below knee amputation (HCC) 09/17/2018   Status post transmetatarsal amputation of foot, right (HCC) 07/23/2018   Osteomyelitis (HCC) 07/23/2018   TIA (transient ischemic attack) 10/30/2017   Medication monitoring encounter 03/28/2017   Status post amputation of toe of right foot (HCC) 03/05/2017   Subacute osteomyelitis, right ankle and foot (HCC)    Peripheral neuropathy 02/16/2017   Peripheral artery disease (HCC) 02/16/2017   Type 2 diabetes mellitus with diabetic foot ulcer (HCC) 02/14/2017   Diabetic foot infection (HCC) 02/14/2017   Type II diabetes mellitus with renal manifestations (HCC)    HLD (hyperlipidemia)    Tobacco abuse    CKD stage 2 due to type 2 diabetes mellitus (HCC)    CAD (coronary artery disease)    Seizure disorder (HCC)    Past Medical History:  Diagnosis Date   CAD (coronary artery disease)    CKD (chronic kidney disease), stage III (HCC)    Diabetes mellitus without complication (HCC)    Type II   History of DVT (deep vein thrombosis)    History of kidney stones    passed stones - no surgery   HLD  (hyperlipidemia)    Myocardial infarction (HCC) 2009   Peripheral vascular disease (HCC)    right leg   Seizure (HCC)    last one 2015   Stroke (HCC) 2012, 10/2017   2012-speech was effected- has come back.10/2017- numbness of right side no taste right  side. 2012-   Tobacco abuse     Family History  Problem Relation Age of Onset   Diabetes Mellitus II Mother    Diabetes Mellitus II Father    Colon cancer Neg Hx    Esophageal cancer Neg Hx  Past Surgical History:  Procedure Laterality Date   AMPUTATION Right 02/23/2017   Procedure: RIGHT FOOT 5TH RAY AMPUTATION;  Surgeon: [name], MD;  Location[facility] OR;  Service: Orthopedics;  Laterality: Right;   AMPUTATION Right 04/23/2018   Procedure: Right Great Toe Amputation;  Surgeon: [name], MD;  Location: [facility] OR;  Service: Orthopedics;  Laterality: Right;   AMPUTATION Right 07/23/2018   Procedure: RIGHT TRANSMETATARSAL AMPUTATION, APPLY WOUND VAC;  Surgeon: [name], MD;  Location: [facility] OR;  Service: Orthopedics;  Laterality: Right;   AMPUTATION Right 09/17/2018   Procedure: RIGHT AMPUTATION BELOW KNEE;  Surgeon: [name], MD;  Location: [facility] OR;  Service: Orthopedics;  Laterality: Right;   APPLICATION OF WOUND VAC Right 09/17/2018   Procedure: Application Of Wound Vac;  Surgeon: [name], MD;  Location: [facility] OR;  Service: Orthopedics;  Laterality: Right;   BELOW KNEE LEG AMPUTATION Right 09/17/2018   CORONARY ANGIOPLASTY  03/05/2008   stent   FOOT SURGERY     PERIPHERAL ARTERIAL STENT GRAFT Right 2014   leg 2 stents- clotted per pat   STENT PLACEMENT VASCULAR ([facility] HX)     Several in right leg   TONSILLECTOMY     TRANSMETATARSAL AMPUTATION Right 07/23/2018   Social History   Occupational History   Not on file  Tobacco Use   Smoking status: Light Smoker    Current packs/day: 0.25    Average packs/day: 0.3 packs/day for 15.0 years (3.8 ttl pk-yrs)    Types: Cigarettes   Smokeless tobacco: Never   Tobacco  comments:    2 or 3 a day   Vaping Use   Vaping status: Never Used  Substance and Sexual Activity   Alcohol use: Not Currently   Drug use: No   Sexual activity: Not on file

## 2023-04-01 ENCOUNTER — Ambulatory Visit (INDEPENDENT_AMBULATORY_CARE_PROVIDER_SITE_OTHER): Payer: 59 | Admitting: Orthopedic Surgery

## 2023-04-01 DIAGNOSIS — L97521 Non-pressure chronic ulcer of other part of left foot limited to breakdown of skin: Secondary | ICD-10-CM | POA: Diagnosis not present

## 2023-04-02 ENCOUNTER — Encounter: Payer: Self-pay | Admitting: Orthopedic Surgery

## 2023-04-02 NOTE — Progress Notes (Signed)
Office Visit Note   Patient: Curtis Bradford           Date of Birth: 26-Aug-1960           MRN: [id number] Visit Date: 04/01/2023              Requested by: Charlane Ferretti, DO [address] [place],  [place] [address] PCP: Charlane Ferretti, DO  Chief Complaint  Patient presents with   Left Foot - Wound Check      HPI: [name] a 62 year old gentleman who is seen in follow-up for ulcerations Wagner grade 1 left foot.  Assessment & Plan: Visit Diagnoses:  1. Non-pressure chronic ulcer of other part of left foot limited to breakdown of skin (HCC)     Plan: The fifth metatarsal head ulcer and first metatarsal head ulcer were debrided on the left foot.  There is improved granulation tissue.  Continue wound care and pressure offloading.  Follow-Up Instructions: Return in about 4 weeks (around 04/29/2023).   Ortho Exam  [name] alert, oriented, no adenopathy, well-dressed, normal affect, normal respiratory effort. Examination there is hypertrophic callus beneath the fifth metatarsal head.  After informed consent a 10 blade knife was used to pare the callus.  After debridement the area is 1 cm diameter there is no open wound no abscess.  Examination the first metatarsal head there is a Wagner grade 1 ulcer.  After informed consent a 10 blade knife was used to debride the skin and soft tissue back to bleeding viable granulation tissue.  Silver nitrate was used for hemostasis the wound was 3 cm in diameter and 1 mm deep with healthy granulation tissue after debridement.  Imaging: No results found. No images are attached to the encounter.  Labs: Lab Results  Component Value Date   HGBA1C 6.9 (H) 07/24/2022   HGBA1C 7.5 (H) 09/17/2018   HGBA1C 8.3 (H) 07/23/2018   ESRSEDRATE 31 (H) 02/14/2017   CRP 12.3 (H) 02/14/2017   REPTSTATUS 03/13/2019 FINAL 03/11/2019   CULT  03/11/2019    NO GROWTH Performed at [facility], [address] [place], [place] [address]      Lab  Results  Component Value Date   ALBUMIN 3.4 (L) 07/24/2022   ALBUMIN 4.3 07/23/2022   ALBUMIN 3.9 10/22/2018   PREALBUMIN 13.8 (L) 02/15/2017    Lab Results  Component Value Date   MG 2.0 07/25/2022   No results found for: "VD25OH"  Lab Results  Component Value Date   PREALBUMIN 13.8 (L) 02/15/2017      Latest Ref Rng & Units 07/25/2022    6:15 AM 07/24/2022    4:42 AM 07/23/2022    9:27 PM  CBC EXTENDED  WBC 4.0 - 10.5 K/uL 11.4  12.0  11.4   RBC 4.22 - 5.81 MIL/uL 4.54  4.81  5.46   Hemoglobin 13.0 - 17.0 g/dL 53.6  64.4  03.4   HCT 39.0 - 52.0 % 40.9  44.2  49.2   Platelets 150 - 400 K/uL 258  287  301   NEUT# 1.7 - 7.7 K/uL 6.8   7.2   Lymph# 0.7 - 4.0 K/uL 2.9   2.7      There is no height or weight on file to calculate BMI.  Orders:  No orders of the defined types were placed in this encounter.  No orders of the defined types were placed in this encounter.    Procedures: No procedures performed  Clinical Data: No  additional findings.  ROS:  All other systems negative, except as noted in the HPI. Review of Systems  Objective: Vital Signs: There were no vitals taken for this visit.  Specialty Comments:  No specialty comments available.  PMFS History: Patient Active Problem List   Diagnosis Date Noted   Acute ischemic stroke (HCC) 07/24/2022   CVA (cerebral vascular accident) (HCC) 07/24/2022   Alkaline phosphatase raised 11/15/2020   Atherosclerosis of coronary artery without angina pectoris 11/15/2020   Benign prostatic hyperplasia with lower urinary tract symptoms 11/15/2020   Hyperglycemia 11/15/2020   Late effects of cerebrovascular disease 11/15/2020   Long term (current) use of insulin (HCC) 11/15/2020   Melena 11/15/2020   Microalbuminuria 11/15/2020   Personal history of other specified conditions 11/15/2020   Right toe amputee (HCC) 11/15/2020   Ulcer of left foot, limited to breakdown of skin (HCC) 11/15/2020   Vitamin D deficiency  11/15/2020   S/P BKA (below knee amputation) unilateral, right (HCC) 10/23/2018   Intermittent pain    Phantom limb pain (HCC)    Stage 3a chronic kidney disease (CKD) (HCC)    Labile blood glucose    Other disorders of plasma-protein metabolism, not elsewhere classified 09/22/2018   Acute blood loss anemia    Hypoalbuminemia due to protein-calorie malnutrition (HCC)    Diabetes mellitus type 2 in nonobese [facility])    Postoperative pain    Neuropathic pain    Seizures (HCC)    Right below-knee amputee (HCC) 09/19/2018   Below knee amputation (HCC) 09/17/2018   Status post transmetatarsal amputation of foot, right (HCC) 07/23/2018   Osteomyelitis (HCC) 07/23/2018   TIA (transient ischemic attack) 10/30/2017   Medication monitoring encounter 03/28/2017   Status post amputation of toe of right foot (HCC) 03/05/2017   Subacute osteomyelitis, right ankle and foot (HCC)    Peripheral neuropathy 02/16/2017   Peripheral artery disease (HCC) 02/16/2017   Type 2 diabetes mellitus with diabetic foot ulcer (HCC) 02/14/2017   Diabetic foot infection (HCC) 02/14/2017   Type II diabetes mellitus with renal manifestations (HCC)    HLD (hyperlipidemia)    Tobacco abuse    CKD stage 2 due to type 2 diabetes mellitus (HCC)    CAD (coronary artery disease)    Seizure disorder (HCC)    Past Medical History:  Diagnosis Date   CAD (coronary artery disease)    CKD (chronic kidney disease), stage III (HCC)    Diabetes mellitus without complication (HCC)    Type II   History of DVT (deep vein thrombosis)    History of kidney stones    passed stones - no surgery   HLD (hyperlipidemia)    Myocardial infarction (HCC) 2009   Peripheral vascular disease (HCC)    right leg   Seizure (HCC)    last one 2015   Stroke (HCC) 2012, 10/2017   2012-speech was effected- has come back.10/2017- numbness of right side no taste right  side. 2012-   Tobacco abuse     Family History  Problem Relation Age of Onset    Diabetes Mellitus II Mother    Diabetes Mellitus II Father    Colon cancer Neg Hx    Esophageal cancer Neg Hx     Past Surgical History:  Procedure Laterality Date   AMPUTATION Right 02/23/2017   Procedure: RIGHT FOOT 5TH RAY AMPUTATION;  Surgeon: [name], MD;  Location: [facility] OR;  Service: Orthopedics;  Laterality: Right;   AMPUTATION Right 04/23/2018   Procedure:  Right Great Toe Amputation;  Surgeon: [name], MD;  Location[facility] OR;  Service: Orthopedics;  Laterality: Right;   AMPUTATION Right 07/23/2018   Procedure: RIGHT TRANSMETATARSAL AMPUTATION, APPLY WOUND VAC;  Surgeon: [name], MD;  Location: [facility] OR;  Service: Orthopedics;  Laterality: Right;   AMPUTATION Right 09/17/2018   Procedure: RIGHT AMPUTATION BELOW KNEE;  Surgeon: [name], MD;  Location: [facility] OR;  Service: Orthopedics;  Laterality: Right;   APPLICATION OF WOUND VAC Right 09/17/2018   Procedure: Application Of Wound Vac;  Surgeon: [name], MD;  Location: [facility] OR;  Service: Orthopedics;  Laterality: Right;   BELOW KNEE LEG AMPUTATION Right 09/17/2018   CORONARY ANGIOPLASTY  03/05/2008   stent   FOOT SURGERY     PERIPHERAL ARTERIAL STENT GRAFT Right 2014   leg 2 stents- clotted per pat   STENT PLACEMENT VASCULAR ([facility] HX)     Several in right leg   TONSILLECTOMY     TRANSMETATARSAL AMPUTATION Right 07/23/2018   Social History   Occupational History   Not on file  Tobacco Use   Smoking status: Light Smoker    Current packs/day: 0.25    Average packs/day: 0.3 packs/day for 15.0 years (3.8 ttl pk-yrs)    Types: Cigarettes   Smokeless tobacco: Never   Tobacco comments:    2 or 3 a day   Vaping Use   Vaping status: Never Used  Substance and Sexual Activity   Alcohol use: Not Currently   Drug use: No   Sexual activity: Not on file

## 2023-04-29 ENCOUNTER — Ambulatory Visit: Payer: 59 | Admitting: Orthopedic Surgery

## 2023-05-07 ENCOUNTER — Ambulatory Visit (INDEPENDENT_AMBULATORY_CARE_PROVIDER_SITE_OTHER): Payer: 59 | Admitting: Orthopedic Surgery

## 2023-05-07 DIAGNOSIS — Z89511 Acquired absence of right leg below knee: Secondary | ICD-10-CM

## 2023-05-07 DIAGNOSIS — L97521 Non-pressure chronic ulcer of other part of left foot limited to breakdown of skin: Secondary | ICD-10-CM

## 2023-05-14 ENCOUNTER — Encounter: Payer: Self-pay | Admitting: Orthopedic Surgery

## 2023-05-14 NOTE — Progress Notes (Signed)
Office Visit Note   Patient: Curtis Bradford           Date of Birth: 11-20-1960           MRN: [id number] Visit Date: 05/07/2023              Requested by: Charlane Ferretti, DO [address] [place],  [place] [address] PCP: Charlane Ferretti, DO  Chief Complaint  Patient presents with   Left Foot - Follow-up      HPI: [name] a 61 year old gentleman who presents with chronic ulcer left foot, status post right below-knee amputation.  Patient states he feels like he is doing better.  Assessment & Plan: Visit Diagnoses:  1. Non-pressure chronic ulcer of other part of left foot limited to breakdown of skin (HCC)   2. 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  Have discussed the possibility of a dorsiflexion osteotomy of the first metatarsal to unload the lateral column.  Follow-Up Instructions: Return in about 4 weeks (around 06/04/2023).   Ortho Exam  [name] alert, oriented, no adenopathy, well-dressed, normal affect, normal respiratory effort. Examination patient is a stable right below-knee amputation.  Left foot he has a cavovarus deformity with a plantarflexed first ray this is pushing his hindfoot into varus he has an ulcer beneath the first metatarsal head and fifth metatarsal head.  After informed consent a 10 blade knife was used to debride the skin and soft tissue back to healthy viable granulation tissue.  The wound beneath the great toe after debridement is 2 cm diameter 1 mm deep and the ulcer beneath the fifth metatarsal head is 1 cm in diameter and 1 mm deep.  Imaging: No results found. No images are attached to the encounter.  Labs: Lab Results  Component Value Date   HGBA1C 6.9 (H) 07/24/2022   HGBA1C 7.5 (H) 09/17/2018   HGBA1C 8.3 (H) 07/23/2018   ESRSEDRATE 31 (H) 02/14/2017   CRP 12.3 (H) 02/14/2017   REPTSTATUS 03/13/2019 FINAL 03/11/2019   CULT  03/11/2019    NO GROWTH Performed  at [facility], [address], [place], [place] [address]      Lab Results  Component Value Date   ALBUMIN 3.4 (L) 07/24/2022   ALBUMIN 4.3 07/23/2022   ALBUMIN 3.9 10/22/2018   PREALBUMIN 13.8 (L) 02/15/2017    Lab Results  Component Value Date   MG 2.0 07/25/2022   No results found for: "VD25OH"  Lab Results  Component Value Date   PREALBUMIN 13.8 (L) 02/15/2017      Latest Ref Rng & Units 07/25/2022    6:15 AM 07/24/2022    4:42 AM 07/23/2022    9:27 PM  CBC EXTENDED  WBC 4.0 - 10.5 K/uL 11.4  12.0  11.4   RBC 4.22 - 5.81 MIL/uL 4.54  4.81  5.46   Hemoglobin 13.0 - 17.0 g/dL 47.4  25.9  56.3   HCT 39.0 - 52.0 % 40.9  44.2  49.2   Platelets 150 - 400 K/uL 258  287  301   NEUT# 1.7 - 7.7 K/uL 6.8   7.2   Lymph# 0.7 - 4.0 K/uL 2.9   2.7      There is no height or weight on file to calculate BMI.  Orders:  No orders of the defined types were placed in this encounter.  No orders of the defined types  were placed in this encounter.    Procedures: No procedures performed  Clinical Data: No additional findings.  ROS:  All other systems negative, except as noted in the HPI. Review of Systems  Objective: Vital Signs: There were no vitals taken for this visit.  Specialty Comments:  No specialty comments available.  PMFS History: Patient Active Problem List   Diagnosis Date Noted   Acute ischemic stroke (HCC) 07/24/2022   CVA (cerebral vascular accident) (HCC) 07/24/2022   Alkaline phosphatase raised 11/15/2020   Atherosclerosis of coronary artery without angina pectoris 11/15/2020   Benign prostatic hyperplasia with lower urinary tract symptoms 11/15/2020   Hyperglycemia 11/15/2020   Late effects of cerebrovascular disease 11/15/2020   Long term (current) use of insulin (HCC) 11/15/2020   Melena 11/15/2020   Microalbuminuria 11/15/2020   Personal history of other specified conditions 11/15/2020   Right toe amputee (HCC) 11/15/2020   Ulcer of  left foot, limited to breakdown of skin (HCC) 11/15/2020   Vitamin D deficiency 11/15/2020   S/P BKA (below knee amputation) unilateral, right (HCC) 10/23/2018   Intermittent pain    Phantom limb pain (HCC)    Stage 3a chronic kidney disease (CKD) (HCC)    Labile blood glucose    Other disorders of plasma-protein metabolism, not elsewhere classified 09/22/2018   Acute blood loss anemia    Hypoalbuminemia due to protein-calorie malnutrition (HCC)    Diabetes mellitus type 2 in nonobese [facility])    Postoperative pain    Neuropathic pain    Seizures (HCC)    Right below-knee amputee (HCC) 09/19/2018   Below knee amputation (HCC) 09/17/2018   Status post transmetatarsal amputation of foot, right (HCC) 07/23/2018   Osteomyelitis (HCC) 07/23/2018   TIA (transient ischemic attack) 10/30/2017   Medication monitoring encounter 03/28/2017   Status post amputation of toe of right foot (HCC) 03/05/2017   Subacute osteomyelitis, right ankle and foot (HCC)    Peripheral neuropathy 02/16/2017   Peripheral artery disease (HCC) 02/16/2017   Type 2 diabetes mellitus with diabetic foot ulcer (HCC) 02/14/2017   Diabetic foot infection (HCC) 02/14/2017   Type II diabetes mellitus with renal manifestations (HCC)    HLD (hyperlipidemia)    Tobacco abuse    CKD stage 2 due to type 2 diabetes mellitus (HCC)    CAD (coronary artery disease)    Seizure disorder (HCC)    Past Medical History:  Diagnosis Date   CAD (coronary artery disease)    CKD (chronic kidney disease), stage III (HCC)    Diabetes mellitus without complication (HCC)    Type II   History of DVT (deep vein thrombosis)    History of kidney stones    passed stones - no surgery   HLD (hyperlipidemia)    Myocardial infarction (HCC) 2009   Peripheral vascular disease (HCC)    right leg   Seizure (HCC)    last one 2015   Stroke (HCC) 2012, 10/2017   2012-speech was effected- has come back.10/2017- numbness of right side no taste right   side. 2012-   Tobacco abuse     Family History  Problem Relation Age of Onset   Diabetes Mellitus II Mother    Diabetes Mellitus II Father    Colon cancer Neg Hx    Esophageal cancer Neg Hx     Past Surgical History:  Procedure Laterality Date   AMPUTATION Right 02/23/2017   Procedure: RIGHT FOOT 5TH RAY AMPUTATION;  Surgeon: [name], MD;  Location:  [facility] OR;  Service: Orthopedics;  Laterality: Right;   AMPUTATION Right 04/23/2018   Procedure: Right Great Toe Amputation;  Surgeon: [name], MD;  Location: [facility] OR;  Service: Orthopedics;  Laterality: Right;   AMPUTATION Right 07/23/2018   Procedure: RIGHT TRANSMETATARSAL AMPUTATION, APPLY WOUND VAC;  Surgeon: [name], MD;  Location: [facility] OR;  Service: Orthopedics;  Laterality: Right;   AMPUTATION Right 09/17/2018   Procedure: RIGHT AMPUTATION BELOW KNEE;  Surgeon: [name], MD;  Location: [facility] OR;  Service: Orthopedics;  Laterality: Right;   APPLICATION OF WOUND VAC Right 09/17/2018   Procedure: Application Of Wound Vac;  Surgeon: [name], MD;  Location: [facility] OR;  Service: Orthopedics;  Laterality: Right;   BELOW KNEE LEG AMPUTATION Right 09/17/2018   CORONARY ANGIOPLASTY  03/05/2008   stent   FOOT SURGERY     PERIPHERAL ARTERIAL STENT GRAFT Right 2014   leg 2 stents- clotted per pat   STENT PLACEMENT VASCULAR ([facility] HX)     Several in right leg   TONSILLECTOMY     TRANSMETATARSAL AMPUTATION Right 07/23/2018   Social History   Occupational History   Not on file  Tobacco Use   Smoking status: Light Smoker    Current packs/day: 0.25    Average packs/day: 0.3 packs/day for 15.0 years (3.8 ttl pk-yrs)    Types: Cigarettes   Smokeless tobacco: Never   Tobacco comments:    2 or 3 a day   Vaping Use   Vaping status: Never Used  Substance and Sexual Activity   Alcohol use: Not Currently   Drug use: No   Sexual activity: Not on file

## 2023-06-03 ENCOUNTER — Ambulatory Visit (INDEPENDENT_AMBULATORY_CARE_PROVIDER_SITE_OTHER): Payer: 59 | Admitting: Orthopedic Surgery

## 2023-06-03 DIAGNOSIS — L97521 Non-pressure chronic ulcer of other part of left foot limited to breakdown of skin: Secondary | ICD-10-CM

## 2023-06-03 DIAGNOSIS — Z89511 Acquired absence of right leg below knee: Secondary | ICD-10-CM

## 2023-06-04 ENCOUNTER — Encounter: Payer: Self-pay | Admitting: Orthopedic Surgery

## 2023-06-04 NOTE — Progress Notes (Signed)
Office Visit Note   Patient: Curtis Bradford           Date of Birth: 1961-04-30           MRN: [id number] Visit Date: 06/03/2023              Requested by: Charlane Ferretti, DO [address] [place],  [place] [address] PCP: Charlane Ferretti, DO  Chief Complaint  Patient presents with   Left Foot - Wound Check      HPI: [name] a 62 year old gentleman who presents in follow-up for bilateral lower extremities.  Patient has a new socket for the right transtibial amputation and has a persistent ulcer on the left foot first metatarsal head.  Had discussed the possibility of a dorsiflexion osteotomy of the first metatarsal to further unload the first metatarsal head.  Assessment & Plan: Visit Diagnoses:  1. Non-pressure chronic ulcer of other part of left foot limited to breakdown of skin (HCC)   2. 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  Discussed with patient's right transtibial amputation and ulceration on the left foot.  He should consider application for full disability.  [name] going to be unable to work on his feet.  Follow-Up Instructions: Return in about 4 weeks (around 07/01/2023).   Ortho Exam  [name] alert, oriented, no adenopathy, well-dressed, normal affect, normal respiratory effort. Examination patient has a well-fitting prosthesis on the right.  Examination of the left foot he has a Wagner grade 1 ulcer beneath the first metatarsal head that is improving.  After informed consent a 10 blade knife was used to pare the callus and the ulcer is 2 mm in diameter.  This does not probe to bone or tendon.  Imaging: No results found. No images are attached to the encounter.  Labs: Lab Results  Component Value Date   HGBA1C 6.9 (H) 07/24/2022   HGBA1C 7.5 (H) 09/17/2018   HGBA1C 8.3 (H) 07/23/2018   ESRSEDRATE 31 (H) 02/14/2017   CRP 12.3 (H) 02/14/2017   REPTSTATUS 03/13/2019 FINAL 03/11/2019    CULT  03/11/2019    NO GROWTH Performed at [facility], [address], [place], [place] [address]      Lab Results  Component Value Date   ALBUMIN 3.4 (L) 07/24/2022   ALBUMIN 4.3 07/23/2022   ALBUMIN 3.9 10/22/2018   PREALBUMIN 13.8 (L) 02/15/2017    Lab Results  Component Value Date   MG 2.0 07/25/2022   No results found for: "VD25OH"  Lab Results  Component Value Date   PREALBUMIN 13.8 (L) 02/15/2017      Latest Ref Rng & Units 07/25/2022    6:15 AM 07/24/2022    4:42 AM 07/23/2022    9:27 PM  CBC EXTENDED  WBC 4.0 - 10.5 K/uL 11.4  12.0  11.4   RBC 4.22 - 5.81 MIL/uL 4.54  4.81  5.46   Hemoglobin 13.0 - 17.0 g/dL 61.6  07.3  71.0   HCT 39.0 - 52.0 % 40.9  44.2  49.2   Platelets 150 - 400 K/uL 258  287  301   NEUT# 1.7 - 7.7 K/uL 6.8   7.2   Lymph# 0.7 - 4.0 K/uL 2.9   2.7      There is no height or weight on file to calculate BMI.  Orders:  No orders of the defined types were placed in this encounter.  No orders of the defined types were placed in this encounter.    Procedures: No procedures performed  Clinical Data: No additional findings.  ROS:  All other systems negative, except as noted in the HPI. Review of Systems  Objective: Vital Signs: There were no vitals taken for this visit.  Specialty Comments:  No specialty comments available.  PMFS History: Patient Active Problem List   Diagnosis Date Noted   Acute ischemic stroke (HCC) 07/24/2022   CVA (cerebral vascular accident) (HCC) 07/24/2022   Alkaline phosphatase raised 11/15/2020   Atherosclerosis of coronary artery without angina pectoris 11/15/2020   Benign prostatic hyperplasia with lower urinary tract symptoms 11/15/2020   Hyperglycemia 11/15/2020   Late effects of cerebrovascular disease 11/15/2020   Long term (current) use of insulin (HCC) 11/15/2020   Melena 11/15/2020   Microalbuminuria 11/15/2020   Personal history of other specified conditions 11/15/2020   Right  toe amputee (HCC) 11/15/2020   Ulcer of left foot, limited to breakdown of skin (HCC) 11/15/2020   Vitamin D deficiency 11/15/2020   S/P BKA (below knee amputation) unilateral, right (HCC) 10/23/2018   Intermittent pain    Phantom limb pain (HCC)    Stage 3a chronic kidney disease (CKD) (HCC)    Labile blood glucose    Other disorders of plasma-protein metabolism, not elsewhere classified 09/22/2018   Acute blood loss anemia    Hypoalbuminemia due to protein-calorie malnutrition (HCC)    Diabetes mellitus type 2 in nonobese [facility])    Postoperative pain    Neuropathic pain    Seizures (HCC)    Right below-knee amputee (HCC) 09/19/2018   Below knee amputation (HCC) 09/17/2018   Status post transmetatarsal amputation of foot, right (HCC) 07/23/2018   Osteomyelitis (HCC) 07/23/2018   TIA (transient ischemic attack) 10/30/2017   Medication monitoring encounter 03/28/2017   Status post amputation of toe of right foot (HCC) 03/05/2017   Subacute osteomyelitis, right ankle and foot (HCC)    Peripheral neuropathy 02/16/2017   Peripheral artery disease (HCC) 02/16/2017   Type 2 diabetes mellitus with diabetic foot ulcer (HCC) 02/14/2017   Diabetic foot infection (HCC) 02/14/2017   Type II diabetes mellitus with renal manifestations (HCC)    HLD (hyperlipidemia)    Tobacco abuse    CKD stage 2 due to type 2 diabetes mellitus (HCC)    CAD (coronary artery disease)    Seizure disorder (HCC)    Past Medical History:  Diagnosis Date   CAD (coronary artery disease)    CKD (chronic kidney disease), stage III (HCC)    Diabetes mellitus without complication (HCC)    Type II   History of DVT (deep vein thrombosis)    History of kidney stones    passed stones - no surgery   HLD (hyperlipidemia)    Myocardial infarction (HCC) 2009   Peripheral vascular disease (HCC)    right leg   Seizure (HCC)    last one 2015   Stroke (HCC) 2012, 10/2017   2012-speech was effected- has come back.10/2017-  numbness of right side no taste right  side. 2012-   Tobacco abuse     Family History  Problem Relation Age of Onset   Diabetes Mellitus II Mother    Diabetes Mellitus II Father    Colon cancer Neg Hx    Esophageal cancer Neg Hx     Past Surgical History:  Procedure Laterality Date   AMPUTATION Right 02/23/2017   Procedure: RIGHT FOOT 5TH RAY AMPUTATION;  Surgeon:  [name], MD;  Location: [facility] OR;  Service: Orthopedics;  Laterality: Right;   AMPUTATION Right 04/23/2018   Procedure: Right Great Toe Amputation;  Surgeon: [name], MD;  Location: [facility] OR;  Service: Orthopedics;  Laterality: Right;   AMPUTATION Right 07/23/2018   Procedure: RIGHT TRANSMETATARSAL AMPUTATION, APPLY WOUND VAC;  Surgeon: [name], MD;  Location: [facility] OR;  Service: Orthopedics;  Laterality: Right;   AMPUTATION Right 09/17/2018   Procedure: RIGHT AMPUTATION BELOW KNEE;  Surgeon: [name], MD;  Location: [facility] OR;  Service: Orthopedics;  Laterality: Right;   APPLICATION OF WOUND VAC Right 09/17/2018   Procedure: Application Of Wound Vac;  Surgeon: [name], MD;  Location: [facility] OR;  Service: Orthopedics;  Laterality: Right;   BELOW KNEE LEG AMPUTATION Right 09/17/2018   CORONARY ANGIOPLASTY  03/05/2008   stent   FOOT SURGERY     PERIPHERAL ARTERIAL STENT GRAFT Right 2014   leg 2 stents- clotted per pat   STENT PLACEMENT VASCULAR ([facility] HX)     Several in right leg   TONSILLECTOMY     TRANSMETATARSAL AMPUTATION Right 07/23/2018   Social History   Occupational History   Not on file  Tobacco Use   Smoking status: Light Smoker    Current packs/day: 0.25    Average packs/day: 0.3 packs/day for 15.0 years (3.8 ttl pk-yrs)    Types: Cigarettes   Smokeless tobacco: Never   Tobacco comments:    2 or 3 a day   Vaping Use   Vaping status: Never Used  Substance and Sexual Activity   Alcohol use: Not Currently   Drug use: No   Sexual activity: Not on file

## 2023-07-01 ENCOUNTER — Ambulatory Visit: Payer: 59 | Admitting: Orthopedic Surgery

## 2023-07-21 ENCOUNTER — Encounter (HOSPITAL_BASED_OUTPATIENT_CLINIC_OR_DEPARTMENT_OTHER): Payer: Self-pay

## 2023-07-21 ENCOUNTER — Other Ambulatory Visit: Payer: Self-pay

## 2023-07-21 ENCOUNTER — Emergency Department (HOSPITAL_BASED_OUTPATIENT_CLINIC_OR_DEPARTMENT_OTHER): Payer: 59

## 2023-07-21 ENCOUNTER — Emergency Department (HOSPITAL_BASED_OUTPATIENT_CLINIC_OR_DEPARTMENT_OTHER)
Admission: EM | Admit: 2023-07-21 | Discharge: 2023-07-21 | Disposition: A | Discharge status: Home / Self Care | Payer: 59 | Attending: Emergency Medicine | Admitting: Emergency Medicine

## 2023-07-21 DIAGNOSIS — T8789 Other complications of amputation stump: Secondary | ICD-10-CM

## 2023-07-21 DIAGNOSIS — Z794 Long term (current) use of insulin: Secondary | ICD-10-CM | POA: Diagnosis not present

## 2023-07-21 DIAGNOSIS — Z7902 Long term (current) use of antithrombotics/antiplatelets: Secondary | ICD-10-CM | POA: Insufficient documentation

## 2023-07-21 DIAGNOSIS — G546 Phantom limb syndrome with pain: Secondary | ICD-10-CM | POA: Diagnosis present

## 2023-07-21 DIAGNOSIS — E119 Type 2 diabetes mellitus without complications: Secondary | ICD-10-CM | POA: Diagnosis not present

## 2023-07-21 DIAGNOSIS — Z7984 Long term (current) use of oral hypoglycemic drugs: Secondary | ICD-10-CM | POA: Insufficient documentation

## 2023-07-21 DIAGNOSIS — T879 Unspecified complications of amputation stump: Secondary | ICD-10-CM | POA: Insufficient documentation

## 2023-07-21 MED ORDER — IBUPROFEN 400 MG PO TABS
600.0000 mg | ORAL_TABLET | Freq: Once | ORAL | Status: AC
  Administered 2023-07-21: 600 mg via ORAL
  Filled 2023-07-21: qty 1

## 2023-07-21 MED ORDER — OXYCODONE-ACETAMINOPHEN 5-325 MG PO TABS: 1.0000 | ORAL_TABLET | Freq: Four times a day (QID) | ORAL | 0 refills | Status: AC | PRN

## 2023-07-21 MED ORDER — DOXYCYCLINE HYCLATE 100 MG PO CAPS: 100.0000 mg | ORAL_CAPSULE | Freq: Two times a day (BID) | ORAL | 0 refills | Status: AC

## 2023-07-21 MED ORDER — ACETAMINOPHEN 325 MG PO TABS
650.0000 mg | ORAL_TABLET | Freq: Once | ORAL | Status: AC
  Administered 2023-07-21: 650 mg via ORAL
  Filled 2023-07-21: qty 2

## 2023-07-21 NOTE — ED Triage Notes (Signed)
Intermittent pain in the bottom of R prosthetic foot since this morning. States pain has kept him awake. Denies previous phantom pains.   R arm stiffness since this morning. Full ROM, no known injury

## 2023-07-21 NOTE — Discharge Instructions (Addendum)
Your x-ray did not show signs of a bone infection.  Your DVT ultrasound did not show signs of a blood clot.  You may be having nerve pain in your leg, for which I would recommend follow-up with your neurologist on Tuesday.  Another possibility, although less likely, would be an infection of the skin or the stump site.  I did start you on an antibiotic for 7 days called doxycycline for this possibility.  If you have worsening, intractable pain, fevers, sudden swelling or discoloration of your leg, please return to the emergency department.

## 2023-07-21 NOTE — ED Provider Notes (Signed)
[place] [facility] AT [facility] Provider Note   CSN: [id number] Arrival date & time: 07/21/23  4098     History  Chief Complaint  Patient presents with   phantom pain    Curtis Bradford is a 63 y.o. male with a history of diabetes, right below the knee amputation, vascular stenting in the right lower extremity, presenting to the ED complaining of sharp pains that radiate down towards his right stump.  Patient reports onset of this pain very recently within the past day.  He says it tends to be better when he is wearing his prosthetic and putting weight on his stump and worse when he is sitting down.  The pain is sharp, stabbing, last about 3 seconds and shoots down into the stomach.  He says he has not had this pain before.  He denies any obvious swelling, no redness of the skin or skin breakdown.  He has an appointment with a neurologist coming up on Tuesday, in 2 days, for which she is in a trial currently.  HPI     Home Medications Prior to Admission medications   Medication Sig Start Date End Date Taking? Authorizing Provider  doxycycline (VIBRAMYCIN) 100 MG capsule Take 1 capsule (100 mg total) by mouth 2 (two) times daily for 7 days. 07/21/23 07/28/23 Yes Jeffree Cazeau, Kermit Balo, MD  oxyCODONE-acetaminophen (PERCOCET/ROXICET) 5-325 MG tablet Take 1 tablet by mouth every 6 (six) hours as needed for up to 12 doses for severe pain (pain score 7-10). 07/21/23  Yes Devontre Siedschlag, Kermit Balo, MD  acetaminophen (TYLENOL) 325 MG tablet Take 1-2 tablets (325-650 mg total) by mouth every 6 (six) hours as needed for mild pain (pain score 1-3 or temp > 100.5). 10/02/18   Angiulli, Mcarthur Rossetti, PA-C  atorvastatin (LIPITOR) 20 MG tablet TAKE 1 TABLET BY MOUTH EVERY DAY Patient taking differently: Take 20 mg by mouth daily. 03/31/20   Ihor Austin, NP  clopidogrel (PLAVIX) 75 MG tablet Take 1 tablet (75 mg total) by mouth at bedtime. 12/15/19   Ihor Austin, NP  Continuous Blood Gluc Sensor  (FREESTYLE LIBRE 14 DAY SENSOR) MISC Place 1 patch onto the skin every 14 (fourteen) days. 06/26/18   [name], MD  Continuous Blood Gluc Transmit (DEXCOM G6 TRANSMITTER) MISC See admin instructions. 08/11/20   [provider]  ezetimibe (ZETIA) 10 MG tablet TAKE 1 TABLET BY MOUTH EVERY DAY Patient taking differently: Take 10 mg by mouth daily. 03/31/20   Ihor Austin, NP  insulin glargine (LANTUS) 100 UNIT/ML Solostar Pen Inject 21-22 Units into the skin at bedtime as needed (blood sugar). 07/25/22   Glade Lloyd, MD  Semaglutide, 1 MG/DOSE, (OZEMPIC, 1 MG/DOSE,) 4 MG/3ML SOPN Inject 1 mg into the skin once a week. 01/10/22   [provider]  Study Thomasene Ripple - milvexian 25 mg or placebo tablet (PI-Sethi) Take 1 tablet by mouth 2 (two) times daily. 07/25/22   Glade Lloyd, MD  tamsulosin (FLOMAX) 0.4 MG CAPS capsule Take 0.4 mg by mouth daily. Patient not taking: Reported on 07/24/2022 07/21/20   [provider]      Allergies    Metformin    Review of Systems   Review of Systems  Physical Exam Updated Vital Signs BP (!) 158/92   Pulse 95   Temp 98 F (36.7 C)   Resp 16   Ht 6\' 3"  (1.905 m)   Wt 87.1 kg   SpO2 100%   BMI 24.00 kg/m  Physical  Exam Constitutional:      General: He is not in acute distress. HENT:     Head: Normocephalic and atraumatic.  Eyes:     Conjunctiva/sclera: Conjunctivae normal.     Pupils: Pupils are equal, round, and reactive to light.  Cardiovascular:     Rate and Rhythm: Normal rate and regular rhythm.  Pulmonary:     Effort: Pulmonary effort is normal. No respiratory distress.  Musculoskeletal:     Comments: Right below the knee amputation with no obvious skin breakdown or new erythema noted, there is some mild tenderness near the bony stump, no visible swelling of the right lower extremity  Skin:    General: Skin is warm and dry.  Neurological:     General: No focal deficit present.     Mental Status: He is  alert. Mental status is at baseline.  Psychiatric:        Mood and Affect: Mood normal.        Behavior: Behavior normal.     ED Results / Procedures / Treatments   Labs (all labs ordered are listed, but only abnormal results are displayed) Labs Reviewed - No data to display  EKG None  Radiology US Venous Img Lower Unilateral Right Result Date: 07/21/2023 CLINICAL DATA:  Right leg pain and swelling.  Prior right BKA. EXAM: RIGHT LOWER EXTREMITY VENOUS DOPPLER ULTRASOUND TECHNIQUE: Gray-scale sonography with compression, as well as color and duplex ultrasound, were performed to evaluate the deep venous system(s) from the level of the common femoral vein through the popliteal and proximal calf veins. COMPARISON:  None Available. FINDINGS: VENOUS Normal compressibility of the common femoral, superficial femoral, and popliteal veins, as well as the visualized proximal calf veins above the amputation level. Visualized portions of profunda femoral vein and great saphenous vein unremarkable. No filling defects to suggest DVT on grayscale or color Doppler imaging. Doppler waveforms show normal direction of venous flow, normal respiratory plasticity and response to augmentation. Limited views of the contralateral common femoral vein are unremarkable. OTHER Tiny hypoechoic fluid collection is seen in the distal right leg stump along its anterolateral aspect which measures 1.4 by 0.3 by 0.5 cm. Infected collection cannot be excluded. Limitations: none IMPRESSION: No evidence of right lower extremity DVT. 1.4 cm hypoechoic fluid collection in distal right leg stump soft tissues. Infected collection cannot be excluded. Electronically Signed   By: Danae Orleans M.D.   On: 07/21/2023 10:07   DG Knee 2 Views Right Result Date: 07/21/2023 CLINICAL DATA:  Status post BKA. Tenderness in the region of the stomach. EXAM: RIGHT KNEE - 1-2 VIEW COMPARISON:  06/30/2019 FINDINGS: No evidence for fracture. No bony  destruction to suggest osteomyelitis. Confluent attenuation in the subcutaneous soft tissues medial and distal to the tibial resection site persist but have decreased in the interval, potentially related to some residual fluid/edema. IMPRESSION: 1. No acute bony findings. 2. Confluent attenuation in the subcutaneous soft tissues medial and distal to the tibial resection site persist but have decreased in the interval, potentially related to some residual fluid/edema. Electronically Signed   By: Kennith Center M.D.   On: 07/21/2023 08:53    Procedures Procedures    Medications Ordered in ED Medications  ibuprofen (ADVIL) tablet 600 mg (600 mg Oral Given 07/21/23 0907)  acetaminophen (TYLENOL) tablet 650 mg (650 mg Oral Given 07/21/23 0907)    ED Course/ Medical Decision Making/ A&P  Medical Decision Making Amount and/or Complexity of Data Reviewed Radiology: ordered.  Risk OTC drugs. Prescription drug management.   Differential diagnosis could include DVT versus bone infection versus neuropathic pain versus phantom limb versus other  I personally viewed interpreted patient's vascular ultrasound and x-ray imaging, notable for no evidence of acute DVT or bone infection.  There is a fluid collection near the distal stump site.  Patient was offered Tylenol and Motrin for pain.  Deferred on narcotics at this time due to him needing to drive home, but we did discuss a short course of narcotic painkillers in the interim until he is able to see his neurologist.  I suspect this is most likely neuropathic pain, we cannot exclude the possibility of a potential early infection or stump site infection.  Therefore I think starting him on doxycycline for 7 days would also be reasonable.  The patient is in agreement with this plan.  Low suspicion for osteomyelitis, sepsis, necrotizing fasciitis.  No indication for CT imaging at this time.  The patient is stable for  discharge.        Final Clinical Impression(s) / ED Diagnoses Final diagnoses:  Amputation stump pain (HCC)    Rx / DC Orders ED Discharge Orders          Ordered    doxycycline (VIBRAMYCIN) 100 MG capsule  2 times daily        07/21/23 1017    oxyCODONE-acetaminophen (PERCOCET/ROXICET) 5-325 MG tablet  Every 6 hours PRN        07/21/23 1017              Terald Sleeper, MD 07/21/23 1018

## 2023-07-24 ENCOUNTER — Other Ambulatory Visit (HOSPITAL_COMMUNITY): Payer: Self-pay | Admitting: Neurology

## 2023-07-24 MED ORDER — STUDY - LIBREXIA-STROKE - MILVEXIAN 25 MG OR PLACEBO TABLET (PI-SETHI): 1.0000 | ORAL_TABLET | Freq: Two times a day (BID) | ORAL | 0 refills | Status: DC

## 2023-08-01 ENCOUNTER — Ambulatory Visit (INDEPENDENT_AMBULATORY_CARE_PROVIDER_SITE_OTHER): Payer: 59 | Admitting: Orthopedic Surgery

## 2023-08-01 DIAGNOSIS — M79661 Pain in right lower leg: Secondary | ICD-10-CM | POA: Diagnosis not present

## 2023-08-01 DIAGNOSIS — Z89511 Acquired absence of right leg below knee: Secondary | ICD-10-CM

## 2023-08-05 ENCOUNTER — Encounter: Payer: Self-pay | Admitting: Orthopedic Surgery

## 2023-08-05 NOTE — Progress Notes (Signed)
 Office Visit Note   Patient: Curtis Bradford           Date of Birth: [birth year]           MRN: [id number] Visit Date: 08/01/2023              Requested by: Valentin Skates, DO [address] [place],  [place] [address] PCP: Valentin Skates, DO  Chief Complaint  Patient presents with   Left Foot - Wound Check      HPI: [name] a 63 year old gentleman who presents with pain in the right transtibial amputation.  Patient states he went to the emergency room about a week and a half ago secondary to pain.  He was treated with doxycycline .  Patient has had radiographs and ultrasound was negative for DVT.  Patient states he has no pain when wearing the prosthesis but states he does have pain when it is removed.  Assessment & Plan: Visit Diagnoses:  1. 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 Pain in right lower leg     Plan: [name] provided a prescription for [employer] for new socket and liner.  [name] subsiding into his socket causing ulceration of the popliteal fossa as well as impingement pain over the peroneal nerve.  Follow-Up Instructions: No follow-ups on file.   Ortho Exam  [name] alert, oriented, no adenopathy, well-dressed, normal affect, normal respiratory effort. Examination patient has pain to palpation over the anterior compartment.  He has pain to palpation over the peroneal nerve.  He has a pressure ulcer in the popliteal fossa.  No cellulitis no open ulcers.  [name] an existing right transtibial  amputee.  Patient's current comorbidities are not expected to impact the ability to function with the prescribed prosthesis. Patient verbally communicates a strong desire to use a prosthesis. Patient currently requires mobility aids to ambulate without a prosthesis.  Expects not to use mobility aids with a new prosthesis.  [name] a K3 level ambulator that spends a lot of time walking around on uneven terrain over obstacles, up and down  stairs, and ambulates with a variable cadence.     Imaging: No results found. No images are attached to the encounter.  Labs: Lab Results  Component Value Date   HGBA1C 6.9 (H) 07/24/2022   HGBA1C 7.5 (H) 09/17/2018   HGBA1C 8.3 (H) 07/23/2018   ESRSEDRATE 31 (H) 02/14/2017   CRP 12.3 (H) 02/14/2017   REPTSTATUS 03/13/2019 FINAL 03/11/2019   CULT  03/11/2019    NO GROWTH Performed at [facility], [address], [place], [place] [address]      Lab Results  Component Value Date   ALBUMIN 3.4 (L) 07/24/2022   ALBUMIN 4.3 07/23/2022   ALBUMIN 3.9 10/22/2018   PREALBUMIN 13.8 (L) 02/15/2017    Lab Results  Component Value Date   MG 2.0 07/25/2022   No results found for: [facility]  Lab Results  Component Value Date   PREALBUMIN 13.8 (L) 02/15/2017      Latest Ref Rng & Units 07/25/2022    6:15 AM 07/24/2022    4:42 AM 07/23/2022    9:27 PM  CBC EXTENDED  WBC 4.0 - 10.5 K/uL 11.4  12.0  11.4   RBC 4.22 - 5.81 MIL/uL 4.54  4.81  5.46   Hemoglobin 13.0 - 17.0 g/dL 85.6  84.8  83.2   HCT 39.0 - 52.0 % 40.9  44.2  49.2   Platelets  150 - 400 K/uL 258  287  301   NEUT# 1.7 - 7.7 K/uL 6.8   7.2   Lymph# 0.7 - 4.0 K/uL 2.9   2.7      There is no height or weight on file to calculate BMI.  Orders:  No orders of the defined types were placed in this encounter.  No orders of the defined types were placed in this encounter.    Procedures: No procedures performed  Clinical Data: No additional findings.  ROS:  All other systems negative, except as noted in the HPI. Review of Systems  Objective: Vital Signs: There were no vitals taken for this visit.  Specialty Comments:  No specialty comments available.  PMFS History: Patient Active Problem List   Diagnosis Date Noted   Acute ischemic stroke (HCC) 07/24/2022   CVA (cerebral vascular accident) (HCC) 07/24/2022   Alkaline phosphatase raised 11/15/2020   Atherosclerosis of coronary artery without  angina pectoris 11/15/2020   Benign prostatic hyperplasia with lower urinary tract symptoms 11/15/2020   Hyperglycemia 11/15/2020   Late effects of cerebrovascular disease 11/15/2020   Long term (current) use of insulin  (HCC) 11/15/2020   Melena 11/15/2020   Microalbuminuria 11/15/2020   Personal history of other specified conditions 11/15/2020   Right toe amputee (HCC) 11/15/2020   Ulcer of left foot, limited to breakdown of skin (HCC) 11/15/2020   Vitamin D deficiency 11/15/2020   S/P BKA (below knee amputation) unilateral, right (HCC) 10/23/2018   Intermittent pain    Phantom limb pain (HCC)    Stage 3a chronic kidney disease (CKD) (HCC)    Labile blood glucose    Other disorders of plasma-protein metabolism, not elsewhere classified 09/22/2018   Acute blood loss anemia    Hypoalbuminemia due to protein-calorie malnutrition (HCC)    Diabetes mellitus type 2 in nonobese [facility])    Postoperative pain    Neuropathic pain    Seizures (HCC)    Right below-knee amputee (HCC) 09/19/2018   Below knee amputation (HCC) 09/17/2018   Status post transmetatarsal amputation of foot, right (HCC) 07/23/2018   Osteomyelitis (HCC) 07/23/2018   TIA (transient ischemic attack) 10/30/2017   Medication monitoring encounter 03/28/2017   Status post amputation of toe of right foot (HCC) 03/05/2017   Subacute osteomyelitis, right ankle and foot (HCC)    Peripheral neuropathy 02/16/2017   Peripheral artery disease (HCC) 02/16/2017   Type 2 diabetes mellitus with diabetic foot ulcer (HCC) 02/14/2017   Diabetic foot infection (HCC) 02/14/2017   Type II diabetes mellitus with renal manifestations (HCC)    HLD (hyperlipidemia)    Tobacco abuse    CKD stage 2 due to type 2 diabetes mellitus (HCC)    CAD (coronary artery disease)    Seizure disorder (HCC)    Past Medical History:  Diagnosis Date   CAD (coronary artery disease)    CKD (chronic kidney disease), stage III (HCC)    Diabetes mellitus  without complication (HCC)    Type II   History of DVT (deep vein thrombosis)    History of kidney stones    passed stones - no surgery   HLD (hyperlipidemia)    Myocardial infarction (HCC) 2009   Peripheral vascular disease (HCC)    right leg   Seizure (HCC)    last one 2015   Stroke (HCC) 2012, 10/2017   2012-speech was effected- has come back.10/2017- numbness of right side no taste right  side. 2012-   Tobacco abuse  Family History  Problem Relation Age of Onset   Diabetes Mellitus II Mother    Diabetes Mellitus II Father    Colon cancer Neg Hx    Esophageal cancer Neg Hx     Past Surgical History:  Procedure Laterality Date   AMPUTATION Right 02/23/2017   Procedure: RIGHT FOOT 5TH RAY AMPUTATION;  Surgeon: Harden Jerona GAILS, MD;  Location: [facility] OR;  Service: Orthopedics;  Laterality: Right;   AMPUTATION Right 04/23/2018   Procedure: Right Great Toe Amputation;  Surgeon: Harden Jerona GAILS, MD;  Location: [facility] OR;  Service: Orthopedics;  Laterality: Right;   AMPUTATION Right 07/23/2018   Procedure: RIGHT TRANSMETATARSAL AMPUTATION, APPLY WOUND VAC;  Surgeon: Harden Jerona GAILS, MD;  Location: [facility] OR;  Service: Orthopedics;  Laterality: Right;   AMPUTATION Right 09/17/2018   Procedure: RIGHT AMPUTATION BELOW KNEE;  Surgeon: Harden Jerona GAILS, MD;  Location: [facility] OR;  Service: Orthopedics;  Laterality: Right;   APPLICATION OF WOUND VAC Right 09/17/2018   Procedure: Application Of Wound Vac;  Surgeon: Harden Jerona GAILS, MD;  Location: [facility] OR;  Service: Orthopedics;  Laterality: Right;   BELOW KNEE LEG AMPUTATION Right 09/17/2018   CORONARY ANGIOPLASTY  03/05/2008   stent   FOOT SURGERY     PERIPHERAL ARTERIAL STENT GRAFT Right 2014   leg 2 stents- clotted per pat   STENT PLACEMENT VASCULAR ([facility] HX)     Several in right leg   TONSILLECTOMY     TRANSMETATARSAL AMPUTATION Right 07/23/2018   Social History   Occupational History   Not on file  Tobacco Use   Smoking status: Light Smoker    Current  packs/day: 0.25    Average packs/day: 0.3 packs/day for 15.0 years (3.8 ttl pk-yrs)    Types: Cigarettes   Smokeless tobacco: Never   Tobacco comments:    2 or 3 a day   Vaping Use   Vaping status: Never Used  Substance and Sexual Activity   Alcohol use: Not Currently   Drug use: No   Sexual activity: Not on file

## 2023-08-23 ENCOUNTER — Ambulatory Visit: Payer: 59 | Attending: Internal Medicine | Admitting: Internal Medicine

## 2023-08-23 ENCOUNTER — Encounter: Payer: Self-pay | Admitting: Internal Medicine

## 2023-08-23 VITALS — BP 120/72 | HR 83 | Ht 75.0 in | Wt 187.0 lb

## 2023-08-23 DIAGNOSIS — E785 Hyperlipidemia, unspecified: Secondary | ICD-10-CM

## 2023-08-23 DIAGNOSIS — I639 Cerebral infarction, unspecified: Secondary | ICD-10-CM

## 2023-08-23 DIAGNOSIS — I2510 Atherosclerotic heart disease of native coronary artery without angina pectoris: Secondary | ICD-10-CM | POA: Diagnosis not present

## 2023-08-23 DIAGNOSIS — I739 Peripheral vascular disease, unspecified: Secondary | ICD-10-CM

## 2023-08-23 DIAGNOSIS — Z8639 Personal history of other endocrine, nutritional and metabolic disease: Secondary | ICD-10-CM | POA: Diagnosis not present

## 2023-08-23 NOTE — Patient Instructions (Signed)
 Medication Instructions:  NO CHANGES   Testing/Procedures: [employer] Cardiac Event Monitor Instructions  Your physician has requested you wear your cardiac event monitor for 30 days [employer] may call or text to confirm a shipping address. The monitor will be sent to a land address via UPS. [employer] will not ship a monitor to a PO BOX. It typically takes 3-5 days to receive your monitor after it has been enrolled. [employer] will assist with USPS tracking if your package is delayed. The telephone number for [employer] is [phone]. Once you have received your monitor, please review the enclosed instructions. Instruction tutorials can also be viewed under help and settings on the enclosed cell phone. Your monitor has already been registered assigning a specific monitor serial # to you.  Billing and Self Pay Discount Information  [employer] has been provided the insurance information we had on file for you.  If your insurance has been updated, please call [employer] at [phone] to provide them with your updated insurance information.   [employer] offers a discounted Self Pay option for patients who have insurance that does not cover their cardiac event monitor or patients without insurance.  The discounted cost of a Self Pay Cardiac Event Monitor would be $225.00 , if the patient contacts [employer] at [phone] within 7 days of applying the monitor to make payment arrangements.  If the patient does not contact [employer] within 7 days of applying the monitor, the cost of the cardiac event monitor will be $350.00.  Applying the monitor  Remove cell phone from case and turn it on. The cell phone works as IT consultant and needs to be within [employer] of you at all times. The cell phone will need to be charged on a daily basis. We recommend you plug the cell phone into the enclosed charger at your bedside table every night.  Monitor batteries: You will receive two monitor  batteries labelled #1 and #2. These are your recorders. Plug battery #2 onto the second connection on the enclosed charger. Keep one battery on the charger at all times. This will keep the monitor battery deactivated. It will also keep it fully charged for when you need to switch your monitor batteries. A small light will be blinking on the battery emblem when it is charging. The light on the battery emblem will remain on when the battery is fully charged.  Open package of a Monitor strip. Insert battery #1 into black hood on strip and gently squeeze monitor battery onto connection as indicated in instruction booklet. Set aside while preparing skin.  Choose location for your strip, vertical or horizontal, as indicated in the instruction booklet. Shave to remove all hair from location. There cannot be any lotions, oils, powders, or colognes on skin where monitor is to be applied. Wipe skin clean with enclosed Saline wipe. Dry skin completely.  Peel paper labeled #1 off the back of the Monitor strip exposing the adhesive. Place the monitor on the chest in the vertical or horizontal position shown in the instruction booklet. One arrow on the monitor strip must be pointing upward. Carefully remove paper labeled #2, attaching remainder of strip to your skin. Try not to create any folds or wrinkles in the strip as you apply it.  Firmly press and release the circle in the center of the monitor battery. You will hear a small beep. This is turning the monitor battery on. The heart emblem on the monitor battery will light up every 5 seconds if the monitor  battery in turned on and connected to the patient securely. Do not push and hold the circle down as this turns the monitor battery off. The cell phone will locate the monitor battery. A screen will appear on the cell phone checking the connection of your monitor strip. This may read poor connection initially but change to good connection within the  next minute. Once your monitor accepts the connection you will hear a series of 3 beeps followed by a climbing crescendo of beeps. A screen will appear on the cell phone showing the two monitor strip placement options. Touch the picture that demonstrates where you applied the monitor strip.  Your monitor strip and battery are waterproof. You are able to shower, bathe, or swim with the monitor on. They just ask you do not submerge deeper than 3 feet underwater. We recommend removing the monitor if you are swimming in a lake, river, or ocean.  Your monitor battery will need to be switched to a fully charged monitor battery approximately once a week. The cell phone will alert you of an action which needs to be made.  On the cell phone, tap for details to reveal connection status, monitor battery status, and cell phone battery status. The green dots indicates your monitor is in good status. A red dot indicates there is something that needs your attention.  To record a symptom, click the circle on the monitor battery. In 30-60 seconds a list of symptoms will appear on the cell phone. Select your symptom and tap save. Your monitor will record a sustained or significant arrhythmia regardless of you clicking the button. Some patients do not feel the heart rhythm irregularities. [employer] will notify us of any serious or critical events.  Refer to instruction booklet for instructions on switching batteries, changing strips, the Do not disturb or Pause features, or any additional questions.  Call [employer] at [phone], to confirm your monitor is transmitting and record your baseline. They will answer any questions you may have regarding the monitor instructions at that time.  Returning the monitor to [employer]  Place all equipment back into blue box. Peel off strip of paper to expose adhesive and close box securely. There is a prepaid UPS shipping label on this box. Drop in a UPS drop  box, or at a UPS facility like [employer]. You may also contact [employer] to arrange UPS to pick up monitor package at your home.    Follow-Up: At [facility], you and your health needs are our priority.  As part of our continuing mission to provide you with exceptional heart care, we have created designated Provider Care Teams.  These Care Teams include your primary Cardiologist (physician) and Advanced Practice Providers (APPs -  Physician Assistants and Nurse Practitioners) who all work together to provide you with the care you need, when you need it.  We recommend signing up for the patient portal called "MyChart".  Sign up information is provided on this After Visit Summary.  MyChart is used to connect with patients for Virtual Visits (Telemedicine).  Patients are able to view lab/test results, encounter notes, upcoming appointments, etc.  Non-urgent messages can be sent to your provider as well.   To learn more about what you can do with MyChart, go to [email or web address].    Your next appointment:   6 months ** call in April for an August/September appointment     1st Floor: - Lobby - Registration  - Pharmacy  - Lab - Cafe  2nd Floor: - PV Lab - Diagnostic Testing (echo, CT, nuclear med)  3rd Floor: - Vacant  4th Floor: - TCTS (cardiothoracic surgery) - AFib Clinic - Structural Heart Clinic - Vascular Surgery  - Vascular Ultrasound  5th Floor: - [facility] Cardiology (general and EP) - Clinical Pharmacy for coumadin, hypertension, lipid, weight-loss medications, and med management appointments    Valet parking services will be available as well.

## 2023-08-23 NOTE — Progress Notes (Signed)
 OFFICE NOTE  Chief Complaint:  Establish cardiologist  Primary Care Physician: Charlane Ferretti, DO  HPI:  Curtis Bradford is a 63 y.o. male with a past medial history significant for coronary artery disease with prior MI in 2009 in the [facility] area.  He was seen at the [facility] [place] [facility].  Apparently on the day that he had his MI he became profusely diaphoretic and had some chest discomfort much like heartburn.  He went into the emergency department and was seen urgently and taken to the Cath Lab presumably for a STEMI.  He ultimately underwent stenting and has not had any recurrent issues.  He does have a history of TIA and recently had a stroke (06/2022) he was not seen by cardiology during this hospitalization however it was advised that we would set up a 30-day monitor however that was not arranged and now it has been a year since that episode.  Other medical problems include chronic kidney disease, stage III, type 2 diabetes, history of DVT, PAD and prior right AKA in 2020 by Dr. Lajoyce Corners.  He has a history of tobacco abuse in the past as well.  He is denied any coronary symptoms such as chest pain or worsening shortness of breath.  His most recent lipid testing showed total cholesterol 89, triglycerides 212, HDL 26 and LDL 21 (06/2022).  PMHx:  Past Medical History:  Diagnosis Date   CAD (coronary artery disease)    CKD (chronic kidney disease), stage III (HCC)    Diabetes mellitus without complication (HCC)    Type II   History of DVT (deep vein thrombosis)    History of kidney stones    passed stones - no surgery   HLD (hyperlipidemia)    Myocardial infarction [facility]) 2009   Peripheral vascular disease (HCC)    right leg   Seizure (HCC)    last one 2015   Stroke (HCC) 2012, 10/2017   2012-speech was effected- has come back.10/2017- numbness of right side no taste right  side. 2012-   Tobacco abuse     Past Surgical History:  Procedure Laterality Date    AMPUTATION Right 02/23/2017   Procedure: RIGHT FOOT 5TH RAY AMPUTATION;  Surgeon: [name], MD;  Location: [facility] OR;  Service: Orthopedics;  Laterality: Right;   AMPUTATION Right 04/23/2018   Procedure: Right Great Toe Amputation;  Surgeon: [name], MD;  Location: [facility] OR;  Service: Orthopedics;  Laterality: Right;   AMPUTATION Right 07/23/2018   Procedure: RIGHT TRANSMETATARSAL AMPUTATION, APPLY WOUND VAC;  Surgeon: [name], MD;  Location: [facility] OR;  Service: Orthopedics;  Laterality: Right;   AMPUTATION Right 09/17/2018   Procedure: RIGHT AMPUTATION BELOW KNEE;  Surgeon: [name], MD;  Location: [facility] OR;  Service: Orthopedics;  Laterality: Right;   APPLICATION OF WOUND VAC Right 09/17/2018   Procedure: Application Of Wound Vac;  Surgeon: [name], MD;  Location: [facility] OR;  Service: Orthopedics;  Laterality: Right;   BELOW KNEE LEG AMPUTATION Right 09/17/2018   CORONARY ANGIOPLASTY  03/05/2008   stent   FOOT SURGERY     PERIPHERAL ARTERIAL STENT GRAFT Right 2014   leg 2 stents- clotted per pat   STENT PLACEMENT VASCULAR ([facility] HX)     Several in right leg   TONSILLECTOMY     TRANSMETATARSAL AMPUTATION Right 07/23/2018    FAMHx:  Family History  Problem Relation Age of Onset   Diabetes Mellitus II Mother  Diabetes Mellitus II Father    Colon cancer Neg Hx    Esophageal cancer Neg Hx     SOCHx:   reports that he has been smoking cigarettes. He has a 3.8 pack-year smoking history. He has never used smokeless tobacco. He reports that he does not currently use alcohol. He reports that he does not use drugs.  ALLERGIES:  Allergies  Allergen Reactions   Metformin     Other reaction(s): seizure, ARF    ROS: Pertinent items noted in HPI and remainder of comprehensive ROS otherwise negative.  HOME MEDS: Current Outpatient Medications on File Prior to Visit  Medication Sig Dispense Refill   atorvastatin (LIPITOR) 20 MG tablet TAKE 1 TABLET BY MOUTH EVERY DAY  (Patient taking differently: Take 20 mg by mouth daily.) 30 tablet 2   clopidogrel (PLAVIX) 75 MG tablet Take 1 tablet (75 mg total) by mouth at bedtime. 90 tablet 0   Continuous Blood Gluc Sensor (FREESTYLE LIBRE 14 DAY SENSOR) MISC Place 1 patch onto the skin every 14 (fourteen) days. 6 each 2   Continuous Blood Gluc Transmit (DEXCOM G6 TRANSMITTER) MISC See admin instructions.     ezetimibe (ZETIA) 10 MG tablet TAKE 1 TABLET BY MOUTH EVERY DAY (Patient taking differently: Take 10 mg by mouth daily.) 30 tablet 2   insulin glargine (LANTUS) 100 UNIT/ML Solostar Pen Inject 21-22 Units into the skin at bedtime as needed (blood sugar).     oxyCODONE-acetaminophen (PERCOCET/ROXICET) 5-325 MG tablet Take 1 tablet by mouth every 6 (six) hours as needed for up to 12 doses for severe pain (pain score 7-10). 12 tablet 0   Semaglutide, 1 MG/DOSE, (OZEMPIC, 1 MG/DOSE,) 4 MG/3ML SOPN Inject 1 mg into the skin once a week.     Study - LIBREXIA-STROKE - milvexian 25 mg or placebo tablet (PI-Sethi) Take 1 tablet by mouth 2 (two) times daily. 420 tablet 0   tamsulosin (FLOMAX) 0.4 MG CAPS capsule Take 0.4 mg by mouth daily.     acetaminophen (TYLENOL) 325 MG tablet Take 1-2 tablets (325-650 mg total) by mouth every 6 (six) hours as needed for mild pain (pain score 1-3 or temp > 100.5). (Patient not taking: Reported on 08/23/2023)     No current facility-administered medications on file prior to visit.    LABS/IMAGING: No results found for this or any previous visit (from the past 48 hours). No results found.  LIPID PANEL:    Component Value Date/Time   CHOL 89 07/24/2022 0442   CHOL 93 (L) 06/10/2018 0921   TRIG 212 (H) 07/24/2022 0442   HDL 26 (L) 07/24/2022 0442   HDL 24 (L) 06/10/2018 0921   CHOLHDL 3.4 07/24/2022 0442   VLDL 42 (H) 07/24/2022 0442   LDLCALC 21 07/24/2022 0442   LDLCALC 2 06/10/2018 0921     WEIGHTS: Wt Readings from Last 3 Encounters:  08/23/23 187 lb (84.8 kg)  07/21/23 192  lb (87.1 kg)  07/24/22 182 lb 12.2 oz (82.9 kg)    VITALS: BP 120/72   Pulse 83   Ht 6\' 3"  (1.905 m)   Wt 187 lb (84.8 kg)   SpO2 97%   BMI 23.37 kg/m   EXAM: General appearance: alert and no distress Neck: no carotid bruit, no JVD, and thyroid not enlarged, symmetric, no tenderness/mass/nodules Lungs: clear to auscultation bilaterally Heart: regular rate and rhythm Abdomen: soft, non-tender; bowel sounds normal; no masses,  no organomegaly Extremities: Status post right AKA Pulses: 2+ and symmetric Skin:  Skin color, texture, turgor normal. No rashes or lesions Neurologic: Grossly normal Psych: Pleasant  EKG: EKG Interpretation Date/Time:  Friday August 23 2023 11:24:02 EST Ventricular Rate:  83 PR Interval:  196 QRS Duration:  116 QT Interval:  380 QTC Calculation: 446 R Axis:   -66  Text Interpretation: Normal sinus rhythm Incomplete right bundle branch block Left anterior fascicular block Minimal voltage criteria for LVH, may be normal variant ( R in aVL ) Anteroseptal infarct , age undetermined When compared with ECG of 23-Jul-2022 20:54, New anteroseptal Q waves are noted Confirmed by Zoila Shutter [phone]) on 08/24/2023 4:30:14 PM    ASSESSMENT: CAD status post PCI to an unknown vessel (presumably LAD) in 2009 in [place] LVEF 50-55% with anteroapical wall motion abnormality (EKG shows LAD territory infarct) PAD status post right AKA (2020) History of TIA and recent stroke (2024) Type 2 diabetes on insulin Hypertension Hyperlipidemia with goal LDL less than 55  PLAN: 1.   Mr. Klipfel has a history of coronary artery disease with prior acute MI and stent presumably to the LAD in 2009.  EKG shows old anterior infarct.  LVEF 50 to 55% with anteroapical wall motion abnormality which is unchanged from prior EKG several years prior to that.  He does have a history of PAD as well.  He is an insulin-dependent diabetic.  His target LDL is less than 55 and he was  well treated with an LDL of 21 about a year ago.  He never underwent any monitoring for possible causes of his stroke.  We therefore ordered a 30-day monitor to see if he has any evidence of any atrial fibrillation.  He is maintained on clopidogrel which was an upgrade from aspirin.  Otherwise he is asymptomatic from any anginal symptoms.  Follow-up with [place] in 6 months or sooner as necessary.  Chrystie Nose, MD, [facility], FACP  [place]  [facility]  Medical Director of the [facility]  Cardiovascular Risk Reduction Clinic Diplomate of the American Board of Clinical Lipidology Attending Cardiologist  Direct Dial: [phone]  Fax: [phone]  Website:  [email or web address] 08/24/2023, 4:30 PM

## 2023-10-18 ENCOUNTER — Inpatient Hospital Stay: Payer: Managed Care, Other (non HMO) | Admitting: Diagnostic Neuroimaging

## 2023-10-23 ENCOUNTER — Ambulatory Visit (INDEPENDENT_AMBULATORY_CARE_PROVIDER_SITE_OTHER): Admitting: Family

## 2023-10-23 ENCOUNTER — Encounter: Payer: Self-pay | Admitting: Family

## 2023-10-23 DIAGNOSIS — L97521 Non-pressure chronic ulcer of other part of left foot limited to breakdown of skin: Secondary | ICD-10-CM | POA: Diagnosis not present

## 2023-10-23 MED ORDER — DOXYCYCLINE HYCLATE 100 MG PO TABS: 100.0000 mg | ORAL_TABLET | Freq: Two times a day (BID) | ORAL | 0 refills | Status: AC

## 2023-10-23 NOTE — Progress Notes (Signed)
 Office Visit Note   Patient: Curtis Bradford           Date of Birth: [birth year]           MRN: [id number] Visit Date: 10/23/2023              Requested by: Windell Hasty, DO [address] [place],  [place] [address] PCP: Windell Hasty, DO  Chief Complaint  Patient presents with   Right Foot - Wound Check      HPI: The patient is a 63 year old gentleman who is seen today in follow-up.  He is concerned for worsening of his Wagner grade 1 ulcer to the left foot.  He also has had thickening of hyperkeratotic tissue beneath the fifth metatarsal head same foot he currently has custom orthotics and Dr. Julio Ohm has applied felt pressure relieving padding to the ulcerative areas as well  He has noticed an odor no fever no chills no redness  Assessment & Plan: Visit Diagnoses: No diagnosis found.  Plan: Continue daily dose of cleansing close monitoring of his foot antibacterial ointment dressing changes offload the medial column.  Discussed heel cord stretching.  Follow-Up Instructions: No follow-ups on file.   Ortho Exam  [name] alert, oriented, no adenopathy, well-dressed, normal affect, normal respiratory effort. On examination left lower extremity cavus deformity of the foot present with clawing of all 5 toes.  There is Wagner grade 1 ulcer beneath the first metatarsal head this is debrided of hyperkeratotic tissue back to viable tissue with a 10 blade knife after informed consent there is underlying ulcer which is 8 mm in diameter 5 mm deep there is no exposed bone or tendon.  The hyperkeratotic tissue beneath the fifth metatarsal head is also debrided with a 10 blade knife there is no underlying ulcer no open area no drainage  No erythema or warmth  Imaging: No results found. No images are attached to the encounter.  Labs: Lab Results  Component Value Date   HGBA1C 6.9 (H) 07/24/2022   HGBA1C 7.5 (H) 09/17/2018   HGBA1C 8.3 (H) 07/23/2018   ESRSEDRATE 31 (H) 02/14/2017   CRP  12.3 (H) 02/14/2017   REPTSTATUS 03/13/2019 FINAL 03/11/2019   CULT  03/11/2019    NO GROWTH Performed at [facility], [address], [place], [place] [address]      Lab Results  Component Value Date   ALBUMIN 3.4 (L) 07/24/2022   ALBUMIN 4.3 07/23/2022   ALBUMIN 3.9 10/22/2018   PREALBUMIN 13.8 (L) 02/15/2017    Lab Results  Component Value Date   MG 2.0 07/25/2022   No results found for: "VD25OH"  Lab Results  Component Value Date   PREALBUMIN 13.8 (L) 02/15/2017      Latest Ref Rng & Units 07/25/2022    6:15 AM 07/24/2022    4:42 AM 07/23/2022    9:27 PM  CBC EXTENDED  WBC 4.0 - 10.5 K/uL 11.4  12.0  11.4   RBC 4.22 - 5.81 MIL/uL 4.54  4.81  5.46   Hemoglobin 13.0 - 17.0 g/dL 84.1  32.4  40.1   HCT 39.0 - 52.0 % 40.9  44.2  49.2   Platelets 150 - 400 K/uL 258  287  301   NEUT# 1.7 - 7.7 K/uL 6.8   7.2   Lymph# 0.7 - 4.0 K/uL 2.9   2.7      There is no height or weight on file to calculate BMI.  Orders:  No orders  of the defined types were placed in this encounter.  No orders of the defined types were placed in this encounter.    Procedures: No procedures performed  Clinical Data: No additional findings.  ROS:  All other systems negative, except as noted in the HPI. Review of Systems  Objective: Vital Signs: There were no vitals taken for this visit.  Specialty Comments:  No specialty comments available.  PMFS History: Patient Active Problem List   Diagnosis Date Noted   Acute ischemic stroke (HCC) 07/24/2022   CVA (cerebral vascular accident) (HCC) 07/24/2022   Alkaline phosphatase raised 11/15/2020   Atherosclerosis of coronary artery without angina pectoris 11/15/2020   Benign prostatic hyperplasia with lower urinary tract symptoms 11/15/2020   Hyperglycemia 11/15/2020   Late effects of cerebrovascular disease 11/15/2020   Long term (current) use of insulin  (HCC) 11/15/2020   Melena 11/15/2020   Microalbuminuria 11/15/2020    Personal history of other specified conditions 11/15/2020   Right toe amputee (HCC) 11/15/2020   Ulcer of left foot, limited to breakdown of skin (HCC) 11/15/2020   Vitamin D deficiency 11/15/2020   S/P BKA (below knee amputation) unilateral, right (HCC) 10/23/2018   Intermittent pain    Phantom limb pain (HCC)    Stage 3a chronic kidney disease (CKD) (HCC)    Labile blood glucose    Other disorders of plasma-protein metabolism, not elsewhere classified 09/22/2018   Acute blood loss anemia    Hypoalbuminemia due to protein-calorie malnutrition (HCC)    Diabetes mellitus type 2 in nonobese [facility])    Postoperative pain    Neuropathic pain    Seizures (HCC)    Right below-knee amputee (HCC) 09/19/2018   Below knee amputation (HCC) 09/17/2018   Status post transmetatarsal amputation of foot, right (HCC) 07/23/2018   Osteomyelitis (HCC) 07/23/2018   TIA (transient ischemic attack) 10/30/2017   Medication monitoring encounter 03/28/2017   Status post amputation of toe of right foot (HCC) 03/05/2017   Subacute osteomyelitis, right ankle and foot (HCC)    Peripheral neuropathy 02/16/2017   Peripheral artery disease (HCC) 02/16/2017   Type 2 diabetes mellitus with diabetic foot ulcer (HCC) 02/14/2017   Diabetic foot infection (HCC) 02/14/2017   Type II diabetes mellitus with renal manifestations (HCC)    HLD (hyperlipidemia)    Tobacco abuse    CKD stage 2 due to type 2 diabetes mellitus (HCC)    CAD (coronary artery disease)    Seizure disorder (HCC)    Past Medical History:  Diagnosis Date   CAD (coronary artery disease)    CKD (chronic kidney disease), stage III (HCC)    Diabetes mellitus without complication (HCC)    Type II   History of DVT (deep vein thrombosis)    History of kidney stones    passed stones - no surgery   HLD (hyperlipidemia)    Myocardial infarction (HCC) 2009   Peripheral vascular disease (HCC)    right leg   Seizure (HCC)    last one 2015   Stroke  (HCC) 2012, 10/2017   2012-speech was effected- has come back.10/2017- numbness of right side no taste right  side. 2012-   Tobacco abuse     Family History  Problem Relation Age of Onset   Diabetes Mellitus II Mother    Diabetes Mellitus II Father    Colon cancer Neg Hx    Esophageal cancer Neg Hx     Past Surgical History:  Procedure Laterality Date   AMPUTATION Right 02/23/2017  Procedure: RIGHT FOOT 5TH RAY AMPUTATION;  Surgeon: Timothy Ford, MD;  Location: [facility] OR;  Service: Orthopedics;  Laterality: Right;   AMPUTATION Right 04/23/2018   Procedure: Right Great Toe Amputation;  Surgeon: Timothy Ford, MD;  Location: [facility] OR;  Service: Orthopedics;  Laterality: Right;   AMPUTATION Right 07/23/2018   Procedure: RIGHT TRANSMETATARSAL AMPUTATION, APPLY WOUND VAC;  Surgeon: Timothy Ford, MD;  Location: [facility] OR;  Service: Orthopedics;  Laterality: Right;   AMPUTATION Right 09/17/2018   Procedure: RIGHT AMPUTATION BELOW KNEE;  Surgeon: Timothy Ford, MD;  Location: [facility] OR;  Service: Orthopedics;  Laterality: Right;   APPLICATION OF WOUND VAC Right 09/17/2018   Procedure: Application Of Wound Vac;  Surgeon: Timothy Ford, MD;  Location: [facility] OR;  Service: Orthopedics;  Laterality: Right;   BELOW KNEE LEG AMPUTATION Right 09/17/2018   CORONARY ANGIOPLASTY  03/05/2008   stent   FOOT SURGERY     PERIPHERAL ARTERIAL STENT GRAFT Right 2014   leg 2 stents- clotted per pat   STENT PLACEMENT VASCULAR ([facility] HX)     Several in right leg   TONSILLECTOMY     TRANSMETATARSAL AMPUTATION Right 07/23/2018   Social History   Occupational History   Not on file  Tobacco Use   Smoking status: Light Smoker    Current packs/day: 0.25    Average packs/day: 0.3 packs/day for 15.0 years (3.8 ttl pk-yrs)    Types: Cigarettes   Smokeless tobacco: Never   Tobacco comments:    2 or 3 a day   Vaping Use   Vaping status: Never Used  Substance and Sexual Activity   Alcohol use: Not Currently   Drug use: No    Sexual activity: Not on file

## 2023-11-14 ENCOUNTER — Ambulatory Visit (INDEPENDENT_AMBULATORY_CARE_PROVIDER_SITE_OTHER): Admitting: Orthopedic Surgery

## 2023-11-14 DIAGNOSIS — Z89511 Acquired absence of right leg below knee: Secondary | ICD-10-CM | POA: Diagnosis not present

## 2023-11-14 DIAGNOSIS — L97521 Non-pressure chronic ulcer of other part of left foot limited to breakdown of skin: Secondary | ICD-10-CM

## 2023-11-19 ENCOUNTER — Encounter: Payer: Self-pay | Admitting: Orthopedic Surgery

## 2023-11-19 NOTE — Progress Notes (Signed)
 Office Visit Note   Patient: Curtis Bradford           Date of Birth: 1960/10/14           MRN: [id number] Visit Date: 11/14/2023              Requested by: Windell Hasty, DO [address] [place],  [place] [address] PCP: Windell Hasty, DO  Chief Complaint  Patient presents with   Left Foot - Wound Check      HPI: [name] a 63 year old gentleman who is seen in follow-up for left foot Wagner grade 1 ulcer.  [name] also status post right transtibial amputation.  Assessment & Plan: Visit Diagnoses:  1. 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 Non-pressure chronic ulcer of other part of left foot limited to breakdown of skin (HCC)     Plan: Ulcers debrided x 2 left foot.  Continue with protective shoe wear and custom orthotics.  Follow-Up Instructions: Return in about 4 weeks (around 12/12/2023).   Ortho Exam  [name] alert, oriented, no adenopathy, well-dressed, normal affect, normal respiratory effort. Examination patient has a stable right transtibial amputation.  Examination of the left foot has a Wagner grade 1 ulcer beneath the 1st and 5th metatarsal heads.  There is no ascending cellulitis no signs of infection.  After informed consent a 10 blade knife was used to debride the skin and soft tissue back to bleeding viable granulation tissue.  After debridement the first metatarsal head ulcer is 3 cm in diameter 1 mm deep and the fifth metatarsal head ulcer is 2 cm diameter 1 mm deep.  Imaging: No results found. No images are attached to the encounter.  Labs: Lab Results  Component Value Date   HGBA1C 6.9 (H) 07/24/2022   HGBA1C 7.5 (H) 09/17/2018   HGBA1C 8.3 (H) 07/23/2018   ESRSEDRATE 31 (H) 02/14/2017   CRP 12.3 (H) 02/14/2017   REPTSTATUS 03/13/2019 FINAL 03/11/2019   CULT  03/11/2019    NO GROWTH Performed at [facility], [address], [place], [place] [address]      Lab Results  Component Value Date   ALBUMIN 3.4  (L) 07/24/2022   ALBUMIN 4.3 07/23/2022   ALBUMIN 3.9 10/22/2018   PREALBUMIN 13.8 (L) 02/15/2017    Lab Results  Component Value Date   MG 2.0 07/25/2022   No results found for: "VD25OH"  Lab Results  Component Value Date   PREALBUMIN 13.8 (L) 02/15/2017      Latest Ref Rng & Units 07/25/2022    6:15 AM 07/24/2022    4:42 AM 07/23/2022    9:27 PM  CBC EXTENDED  WBC 4.0 - 10.5 K/uL 11.4  12.0  11.4   RBC 4.22 - 5.81 MIL/uL 4.54  4.81  5.46   Hemoglobin 13.0 - 17.0 g/dL 96.2  95.2  84.1   HCT 39.0 - 52.0 % 40.9  44.2  49.2   Platelets 150 - 400 K/uL 258  287  301   NEUT# 1.7 - 7.7 K/uL 6.8   7.2   Lymph# 0.7 - 4.0 K/uL 2.9   2.7      There is no height or weight on file to calculate BMI.  Orders:  No orders of the defined types were placed in this encounter.  No orders of the defined types were placed in this encounter.    Procedures: No procedures performed  Clinical Data: No additional findings.  ROS:  All other systems negative, except as noted in the HPI. Review of Systems  Objective: Vital Signs: There were no vitals taken for this visit.  Specialty Comments:  No specialty comments available.  PMFS History: Patient Active Problem List   Diagnosis Date Noted   Acute ischemic stroke (HCC) 07/24/2022   CVA (cerebral vascular accident) (HCC) 07/24/2022   Alkaline phosphatase raised 11/15/2020   Atherosclerosis of coronary artery without angina pectoris 11/15/2020   Benign prostatic hyperplasia with lower urinary tract symptoms 11/15/2020   Hyperglycemia 11/15/2020   Late effects of cerebrovascular disease 11/15/2020   Long term (current) use of insulin  (HCC) 11/15/2020   Melena 11/15/2020   Microalbuminuria 11/15/2020   Personal history of other specified conditions 11/15/2020   Right toe amputee (HCC) 11/15/2020   Ulcer of left foot, limited to breakdown of skin (HCC) 11/15/2020   Vitamin D deficiency 11/15/2020   S/P BKA (below knee amputation)  unilateral, right (HCC) 10/23/2018   Intermittent pain    Phantom limb pain (HCC)    Stage 3a chronic kidney disease (CKD) (HCC)    Labile blood glucose    Other disorders of plasma-protein metabolism, not elsewhere classified 09/22/2018   Acute blood loss anemia    Hypoalbuminemia due to protein-calorie malnutrition (HCC)    Diabetes mellitus type 2 in nonobese [facility])    Postoperative pain    Neuropathic pain    Seizures (HCC)    Right below-knee amputee (HCC) 09/19/2018   Below knee amputation (HCC) 09/17/2018   Status post transmetatarsal amputation of foot, right (HCC) 07/23/2018   Osteomyelitis (HCC) 07/23/2018   TIA (transient ischemic attack) 10/30/2017   Medication monitoring encounter 03/28/2017   Status post amputation of toe of right foot (HCC) 03/05/2017   Subacute osteomyelitis, right ankle and foot (HCC)    Peripheral neuropathy 02/16/2017   Peripheral artery disease (HCC) 02/16/2017   Type 2 diabetes mellitus with diabetic foot ulcer (HCC) 02/14/2017   Diabetic foot infection (HCC) 02/14/2017   Type II diabetes mellitus with renal manifestations (HCC)    HLD (hyperlipidemia)    Tobacco abuse    CKD stage 2 due to type 2 diabetes mellitus (HCC)    CAD (coronary artery disease)    Seizure disorder (HCC)    Past Medical History:  Diagnosis Date   CAD (coronary artery disease)    CKD (chronic kidney disease), stage III (HCC)    Diabetes mellitus without complication (HCC)    Type II   History of DVT (deep vein thrombosis)    History of kidney stones    passed stones - no surgery   HLD (hyperlipidemia)    Myocardial infarction (HCC) 2009   Peripheral vascular disease (HCC)    right leg   Seizure (HCC)    last one 2015   Stroke (HCC) 2012, 10/2017   2012-speech was effected- has come back.10/2017- numbness of right side no taste right  side. 2012-   Tobacco abuse     Family History  Problem Relation Age of Onset   Diabetes Mellitus II Mother    Diabetes  Mellitus II Father    Colon cancer Neg Hx    Esophageal cancer Neg Hx     Past Surgical History:  Procedure Laterality Date   AMPUTATION Right 02/23/2017   Procedure: RIGHT FOOT 5TH RAY AMPUTATION;  Surgeon: Timothy Ford, MD;  Location: [facility] OR;  Service: Orthopedics;  Laterality: Right;   AMPUTATION Right 04/23/2018   Procedure: Right Great Toe Amputation;  Surgeon: Timothy Ford, MD;  Location: [facility] OR;  Service: Orthopedics;  Laterality: Right;   AMPUTATION Right 07/23/2018   Procedure: RIGHT TRANSMETATARSAL AMPUTATION, APPLY WOUND VAC;  Surgeon: Timothy Ford, MD;  Location: [facility] OR;  Service: Orthopedics;  Laterality: Right;   AMPUTATION Right 09/17/2018   Procedure: RIGHT AMPUTATION BELOW KNEE;  Surgeon: Timothy Ford, MD;  Location: [facility] OR;  Service: Orthopedics;  Laterality: Right;   APPLICATION OF WOUND VAC Right 09/17/2018   Procedure: Application Of Wound Vac;  Surgeon: Timothy Ford, MD;  Location: [facility] OR;  Service: Orthopedics;  Laterality: Right;   BELOW KNEE LEG AMPUTATION Right 09/17/2018   CORONARY ANGIOPLASTY  03/05/2008   stent   FOOT SURGERY     PERIPHERAL ARTERIAL STENT GRAFT Right 2014   leg 2 stents- clotted per pat   STENT PLACEMENT VASCULAR ([facility] HX)     Several in right leg   TONSILLECTOMY     TRANSMETATARSAL AMPUTATION Right 07/23/2018   Social History   Occupational History   Not on file  Tobacco Use   Smoking status: Light Smoker    Current packs/day: 0.25    Average packs/day: 0.3 packs/day for 15.0 years (3.8 ttl pk-yrs)    Types: Cigarettes   Smokeless tobacco: Never   Tobacco comments:    2 or 3 a day   Vaping Use   Vaping status: Never Used  Substance and Sexual Activity   Alcohol use: Not Currently   Drug use: No   Sexual activity: Not on file

## 2023-12-12 ENCOUNTER — Encounter: Payer: Self-pay | Admitting: Physician Assistant

## 2023-12-12 ENCOUNTER — Ambulatory Visit: Admitting: Physician Assistant

## 2023-12-12 DIAGNOSIS — L97521 Non-pressure chronic ulcer of other part of left foot limited to breakdown of skin: Secondary | ICD-10-CM | POA: Diagnosis not present

## 2023-12-12 DIAGNOSIS — M25572 Pain in left ankle and joints of left foot: Secondary | ICD-10-CM

## 2023-12-12 NOTE — Progress Notes (Signed)
 Office Visit Note   Patient: Curtis Bradford           Date of Birth: 01-27-1961           MRN: [id number] Visit Date: 12/12/2023              Requested by: Windell Hasty, DO [address] [place],  [place] [address] PCP: Windell Hasty, DO  Chief Complaint  Patient presents with   Left Foot - Follow-up   Left Ankle - Pain      HPI: [name] a 63 year old gentleman who is seen in follow-up for left foot Wagner grade 1 ulcer. [name] also status post right transtibial amputation.  He reported that he twisted his left ankle about a week and a half ago.  He was placed in a cam boot.  He states the boot hurt more, so he is in a regular shoe.  He states the ankle feels better.    Assessment & Plan: Visit Diagnoses:  1. Non-pressure chronic ulcer of other part of left foot limited to breakdown of skin (HCC)   2. Pain in left ankle and joints of left foot     Plan: WBAT in his everyday shoe.  Elevation for edema of the left LE.  F/U for callus debridement in 4 weeks.  Follow-Up Instructions: Return in about 4 weeks (around 01/09/2024).   Ortho Exam  [name] alert, oriented, no adenopathy, well-dressed, normal affect, normal respiratory effort. 10 blade sharp debridement to medial first MTP head and lateral bunionette side of the plantar foot.  He has mild edema in the left ankle without open wounds or signs of infection.  ROM is intact.    Imaging: No results found.    Labs: Lab Results  Component Value Date   HGBA1C 6.9 (H) 07/24/2022   HGBA1C 7.5 (H) 09/17/2018   HGBA1C 8.3 (H) 07/23/2018   ESRSEDRATE 31 (H) 02/14/2017   CRP 12.3 (H) 02/14/2017   REPTSTATUS 03/13/2019 FINAL 03/11/2019   CULT  03/11/2019    NO GROWTH Performed at [facility], [address], [place], [place] [address]      Lab Results  Component Value Date   ALBUMIN 3.4 (L) 07/24/2022   ALBUMIN 4.3 07/23/2022   ALBUMIN 3.9 10/22/2018   PREALBUMIN 13.8 (L) 02/15/2017    Lab  Results  Component Value Date   MG 2.0 07/25/2022   No results found for: [facility]  Lab Results  Component Value Date   PREALBUMIN 13.8 (L) 02/15/2017      Latest Ref Rng & Units 07/25/2022    6:15 AM 07/24/2022    4:42 AM 07/23/2022    9:27 PM  CBC EXTENDED  WBC 4.0 - 10.5 K/uL 11.4  12.0  11.4   RBC 4.22 - 5.81 MIL/uL 4.54  4.81  5.46   Hemoglobin 13.0 - 17.0 g/dL 63.0  16.0  10.9   HCT 39.0 - 52.0 % 40.9  44.2  49.2   Platelets 150 - 400 K/uL 258  287  301   NEUT# 1.7 - 7.7 K/uL 6.8   7.2   Lymph# 0.7 - 4.0 K/uL 2.9   2.7      There is no height or weight on file to calculate BMI.  Orders:  No orders of the defined types were placed in this encounter.  No orders of the defined types were placed in this encounter.    Procedures: No procedures performed  Clinical Data: No additional findings.  ROS:  All other systems negative, except as noted in the HPI. Review of Systems  Objective: Vital Signs: There were no vitals taken for this visit.  Specialty Comments:  No specialty comments available.  PMFS History: Patient Active Problem List   Diagnosis Date Noted   Acute ischemic stroke (HCC) 07/24/2022   CVA (cerebral vascular accident) (HCC) 07/24/2022   Alkaline phosphatase raised 11/15/2020   Atherosclerosis of coronary artery without angina pectoris 11/15/2020   Benign prostatic hyperplasia with lower urinary tract symptoms 11/15/2020   Hyperglycemia 11/15/2020   Late effects of cerebrovascular disease 11/15/2020   Long term (current) use of insulin  (HCC) 11/15/2020   Melena 11/15/2020   Microalbuminuria 11/15/2020   Personal history of other specified conditions 11/15/2020   Right toe amputee (HCC) 11/15/2020   Ulcer of left foot, limited to breakdown of skin (HCC) 11/15/2020   Vitamin D deficiency 11/15/2020   S/P BKA (below knee amputation) unilateral, right (HCC) 10/23/2018   Intermittent pain    Phantom limb pain (HCC)    Stage 3a chronic  kidney disease (CKD) (HCC)    Labile blood glucose    Other disorders of plasma-protein metabolism, not elsewhere classified 09/22/2018   Acute blood loss anemia    Hypoalbuminemia due to protein-calorie malnutrition (HCC)    Diabetes mellitus type 2 in nonobese [facility])    Postoperative pain    Neuropathic pain    Seizures (HCC)    Right below-knee amputee (HCC) 09/19/2018   Below knee amputation (HCC) 09/17/2018   Status post transmetatarsal amputation of foot, right (HCC) 07/23/2018   Osteomyelitis (HCC) 07/23/2018   TIA (transient ischemic attack) 10/30/2017   Medication monitoring encounter 03/28/2017   Status post amputation of toe of right foot (HCC) 03/05/2017   Subacute osteomyelitis, right ankle and foot (HCC)    Peripheral neuropathy 02/16/2017   Peripheral artery disease (HCC) 02/16/2017   Type 2 diabetes mellitus with diabetic foot ulcer (HCC) 02/14/2017   Diabetic foot infection (HCC) 02/14/2017   Type II diabetes mellitus with renal manifestations (HCC)    HLD (hyperlipidemia)    Tobacco abuse    CKD stage 2 due to type 2 diabetes mellitus (HCC)    CAD (coronary artery disease)    Seizure disorder (HCC)    Past Medical History:  Diagnosis Date   CAD (coronary artery disease)    CKD (chronic kidney disease), stage III (HCC)    Diabetes mellitus without complication (HCC)    Type II   History of DVT (deep vein thrombosis)    History of kidney stones    passed stones - no surgery   HLD (hyperlipidemia)    Myocardial infarction (HCC) 2009   Peripheral vascular disease (HCC)    right leg   Seizure (HCC)    last one 2015   Stroke (HCC) 2012, 10/2017   2012-speech was effected- has come back.10/2017- numbness of right side no taste right  side. 2012-   Tobacco abuse     Family History  Problem Relation Age of Onset   Diabetes Mellitus II Mother    Diabetes Mellitus II Father    Colon cancer Neg Hx    Esophageal cancer Neg Hx     Past Surgical History:   Procedure Laterality Date   AMPUTATION Right 02/23/2017   Procedure: RIGHT FOOT 5TH RAY AMPUTATION;  Surgeon: Timothy Ford, MD;  Location: [facility] OR;  Service: Orthopedics;  Laterality: Right;   AMPUTATION Right 04/23/2018   Procedure: Right Great Toe  Amputation;  Surgeon: Timothy Ford, MD;  Location: [facility] OR;  Service: Orthopedics;  Laterality: Right;   AMPUTATION Right 07/23/2018   Procedure: RIGHT TRANSMETATARSAL AMPUTATION, APPLY WOUND VAC;  Surgeon: Timothy Ford, MD;  Location: [facility] OR;  Service: Orthopedics;  Laterality: Right;   AMPUTATION Right 09/17/2018   Procedure: RIGHT AMPUTATION BELOW KNEE;  Surgeon: Timothy Ford, MD;  Location: [facility] OR;  Service: Orthopedics;  Laterality: Right;   APPLICATION OF WOUND VAC Right 09/17/2018   Procedure: Application Of Wound Vac;  Surgeon: Timothy Ford, MD;  Location:  [facility] OR;  Service: Orthopedics;  Laterality: Right;   BELOW KNEE LEG AMPUTATION Right 09/17/2018   CORONARY ANGIOPLASTY  03/05/2008   stent   FOOT SURGERY     PERIPHERAL ARTERIAL STENT GRAFT Right 2014   leg 2 stents- clotted per pat   STENT PLACEMENT VASCULAR ([facility] HX)     Several in right leg   TONSILLECTOMY     TRANSMETATARSAL AMPUTATION Right 07/23/2018   Social History   Occupational History   Not on file  Tobacco Use   Smoking status: Light Smoker    Current packs/day: 0.25    Average packs/day: 0.3 packs/day for 15.0 years (3.8 ttl pk-yrs)    Types: Cigarettes   Smokeless tobacco: Never   Tobacco comments:    2 or 3 a day   Vaping Use   Vaping status: Never Used  Substance and Sexual Activity   Alcohol use: Not Currently   Drug use: No   Sexual activity: Not on file

## 2024-01-09 ENCOUNTER — Ambulatory Visit (INDEPENDENT_AMBULATORY_CARE_PROVIDER_SITE_OTHER): Admitting: Orthopedic Surgery

## 2024-01-09 DIAGNOSIS — L97521 Non-pressure chronic ulcer of other part of left foot limited to breakdown of skin: Secondary | ICD-10-CM

## 2024-01-12 ENCOUNTER — Encounter: Payer: Self-pay | Admitting: Orthopedic Surgery

## 2024-01-12 NOTE — Progress Notes (Signed)
 Office Visit Note   Patient: Curtis Bradford           Date of Birth: 1960/09/22           MRN: [id number] Visit Date: 01/09/2024              Requested by: Valentin Skates, DO [address] [place],  [place] [address] PCP: Valentin Skates, DO  Chief Complaint  Patient presents with   Left Foot - Follow-up   Left Ankle - Follow-up      HPI: [name] a 63 year old gentleman who presents in follow-up with a new Wagner grade 1 ulcer fifth metatarsal head left foot.  Patient has an existing Wagner grade 1 ulcer beneath the first metatarsal head.  Assessment & Plan: Visit Diagnoses:  1. Non-pressure chronic ulcer of other part of left foot limited to breakdown of skin (HCC)     Plan: Ulcers were debrided x 2.  Continue with protective orthotic and shoe wear.  Follow-Up Instructions: Return in about 4 weeks (around 02/06/2024).   Ortho Exam  [name] alert, oriented, no adenopathy, well-dressed, normal affect, normal respiratory effort. Examination patient has a new Wagner grade 1 ulcer beneath the fifth metatarsal head and the existing ulcer beneath the first metatarsal head.  Patient has a cavovarus foot.  After informed consent a 10 blade knife was used to debride both ulcers back to bleeding viable granulation tissue.  There is no exposed bone or tendon.  The left fifth metatarsal head ulcer is 3 cm in diameter after debridement the left first metatarsal head ulcer is 3 cm in diameter after debridement.    Imaging: No results found. No images are attached to the encounter.  Labs: Lab Results  Component Value Date   HGBA1C 6.9 (H) 07/24/2022   HGBA1C 7.5 (H) 09/17/2018   HGBA1C 8.3 (H) 07/23/2018   ESRSEDRATE 31 (H) 02/14/2017   CRP 12.3 (H) 02/14/2017   REPTSTATUS 03/13/2019 FINAL 03/11/2019   CULT  03/11/2019    NO GROWTH Performed at [facility], [address] [place], [place] [address]      Lab Results  Component Value Date   ALBUMIN 3.4 (L) 07/24/2022    ALBUMIN 4.3 07/23/2022   ALBUMIN 3.9 10/22/2018   PREALBUMIN 13.8 (L) 02/15/2017    Lab Results  Component Value Date   MG 2.0 07/25/2022   No results found for: [facility]  Lab Results  Component Value Date   PREALBUMIN 13.8 (L) 02/15/2017      Latest Ref Rng & Units 07/25/2022    6:15 AM 07/24/2022    4:42 AM 07/23/2022    9:27 PM  CBC EXTENDED  WBC 4.0 - 10.5 K/uL 11.4  12.0  11.4   RBC 4.22 - 5.81 MIL/uL 4.54  4.81  5.46   Hemoglobin 13.0 - 17.0 g/dL 85.6  84.8  83.2   HCT 39.0 - 52.0 % 40.9  44.2  49.2   Platelets 150 - 400 K/uL 258  287  301   NEUT# 1.7 - 7.7 K/uL 6.8   7.2   Lymph# 0.7 - 4.0 K/uL 2.9   2.7      There is no height or weight on file to calculate BMI.  Orders:  No orders of the defined types were placed in this encounter.  No orders of the defined types were placed in this encounter.    Procedures: No procedures performed  Clinical Data: No additional findings.  ROS:  All other  systems negative, except as noted in the HPI. Review of Systems  Objective: Vital Signs: There were no vitals taken for this visit.  Specialty Comments:  No specialty comments available.  PMFS History: Patient Active Problem List   Diagnosis Date Noted   Acute ischemic stroke (HCC) 07/24/2022   CVA (cerebral vascular accident) (HCC) 07/24/2022   Alkaline phosphatase raised 11/15/2020   Atherosclerosis of coronary artery without angina pectoris 11/15/2020   Benign prostatic hyperplasia with lower urinary tract symptoms 11/15/2020   Hyperglycemia 11/15/2020   Late effects of cerebrovascular disease 11/15/2020   Long term (current) use of insulin  (HCC) 11/15/2020   Melena 11/15/2020   Microalbuminuria 11/15/2020   Personal history of other specified conditions 11/15/2020   Right toe amputee (HCC) 11/15/2020   Ulcer of left foot, limited to breakdown of skin (HCC) 11/15/2020   Vitamin D deficiency 11/15/2020   S/P BKA (below knee amputation) unilateral,  right (HCC) 10/23/2018   Intermittent pain    Phantom limb pain (HCC)    Stage 3a chronic kidney disease (CKD) (HCC)    Labile blood glucose    Other disorders of plasma-protein metabolism, not elsewhere classified 09/22/2018   Acute blood loss anemia    Hypoalbuminemia due to protein-calorie malnutrition (HCC)    Diabetes mellitus type 2 in nonobese [facility])    Postoperative pain    Neuropathic pain    Seizures (HCC)    Right below-knee amputee (HCC) 09/19/2018   Below knee amputation (HCC) 09/17/2018   Status post transmetatarsal amputation of foot, right (HCC) 07/23/2018   Osteomyelitis (HCC) 07/23/2018   TIA (transient ischemic attack) 10/30/2017   Medication monitoring encounter 03/28/2017   Status post amputation of toe of right foot (HCC) 03/05/2017   Subacute osteomyelitis, right ankle and foot (HCC)    Peripheral neuropathy 02/16/2017   Peripheral artery disease (HCC) 02/16/2017   Type 2 diabetes mellitus with diabetic foot ulcer (HCC) 02/14/2017   Diabetic foot infection (HCC) 02/14/2017   Type II diabetes mellitus with renal manifestations (HCC)    HLD (hyperlipidemia)    Tobacco abuse    CKD stage 2 due to type 2 diabetes mellitus (HCC)    CAD (coronary artery disease)    Seizure disorder (HCC)    Past Medical History:  Diagnosis Date   CAD (coronary artery disease)    CKD (chronic kidney disease), stage III (HCC)    Diabetes mellitus without complication (HCC)    Type II   History of DVT (deep vein thrombosis)    History of kidney stones    passed stones - no surgery   HLD (hyperlipidemia)    Myocardial infarction (HCC) 2009   Peripheral vascular disease (HCC)    right leg   Seizure (HCC)    last one 2015   Stroke (HCC) 2012, 10/2017   2012-speech was effected- has come back.10/2017- numbness of right side no taste right  side. 2012-   Tobacco abuse     Family History  Problem Relation Age of Onset   Diabetes Mellitus II Mother    Diabetes Mellitus II  Father    Colon cancer Neg Hx    Esophageal cancer Neg Hx     Past Surgical History:  Procedure Laterality Date   AMPUTATION Right 02/23/2017   Procedure: RIGHT FOOT 5TH RAY AMPUTATION;  Surgeon: Harden Jerona GAILS, MD;  Location: [facility] OR;  Service: Orthopedics;  Laterality: Right;   AMPUTATION Right 04/23/2018   Procedure: Right Great Toe Amputation;  Surgeon: Harden,  Jerona GAILS, MD;  Location: [facility] OR;  Service: Orthopedics;  Laterality: Right;   AMPUTATION Right 07/23/2018   Procedure: RIGHT TRANSMETATARSAL AMPUTATION, APPLY WOUND VAC;  Surgeon: Harden Jerona GAILS, MD;  Location: [facility] OR;  Service: Orthopedics;  Laterality: Right;   AMPUTATION Right 09/17/2018   Procedure: RIGHT AMPUTATION BELOW KNEE;  Surgeon: Harden Jerona GAILS, MD;  Location: [facility] OR;  Service: Orthopedics;  Laterality: Right;   APPLICATION OF WOUND VAC Right 09/17/2018   Procedure: Application Of Wound Vac;  Surgeon: Harden Jerona GAILS, MD;  Location: [facility] OR;  Service: Orthopedics;  Laterality: Right;   BELOW KNEE LEG AMPUTATION Right 09/17/2018   CORONARY ANGIOPLASTY  03/05/2008   stent   FOOT SURGERY     PERIPHERAL ARTERIAL STENT GRAFT Right 2014   leg 2 stents- clotted per pat   STENT PLACEMENT VASCULAR ([facility] HX)     Several in right leg   TONSILLECTOMY     TRANSMETATARSAL AMPUTATION Right 07/23/2018   Social History   Occupational History   Not on file  Tobacco Use   Smoking status: Light Smoker    Current packs/day: 0.25    Average packs/day: 0.3 packs/day for 15.0 years (3.8 ttl pk-yrs)    Types: Cigarettes   Smokeless tobacco: Never   Tobacco comments:    2 or 3 a day   Vaping Use   Vaping status: Never Used  Substance and Sexual Activity   Alcohol use: Not Currently   Drug use: No   Sexual activity: Not on file

## 2024-01-23 ENCOUNTER — Other Ambulatory Visit (HOSPITAL_COMMUNITY): Payer: Self-pay | Admitting: Pharmacist

## 2024-01-23 MED ORDER — STUDY - LIBREXIA-STROKE - MILVEXIAN 25 MG OR PLACEBO TABLET (PI-SETHI): 1.0000 | ORAL_TABLET | Freq: Two times a day (BID) | ORAL | 0 refills | Status: DC

## 2024-02-13 ENCOUNTER — Ambulatory Visit (INDEPENDENT_AMBULATORY_CARE_PROVIDER_SITE_OTHER): Admitting: Orthopedic Surgery

## 2024-02-13 DIAGNOSIS — L97521 Non-pressure chronic ulcer of other part of left foot limited to breakdown of skin: Secondary | ICD-10-CM

## 2024-02-14 ENCOUNTER — Encounter: Payer: Self-pay | Admitting: Orthopedic Surgery

## 2024-02-14 NOTE — Progress Notes (Signed)
 Office Visit Note   Patient: Curtis Bradford           Date of Birth: 1961/02/15           MRN: [id number] Visit Date: 02/13/2024              Requested by: Valentin Skates, DO [address] [place],  [place] [address] PCP: Valentin Skates, DO  Chief Complaint  Patient presents with   Left Foot - Follow-up   Left Ankle - Follow-up      HPI: [name] a 63 year old gentleman who presents follow-up for [facility] grade 1 ulcer left foot 1st and 5th metatarsal heads.  Assessment & Plan: Visit Diagnoses:  1. Non-pressure chronic ulcer of other part of left foot limited to breakdown of skin (HCC)     Plan: Callus debrided x 1 and ulcer debrided x 1.  Follow-Up Instructions: Return in about 4 weeks (around 03/12/2024).   Ortho Exam  [name] alert, oriented, no adenopathy, well-dressed, normal affect, normal respiratory effort. Examination patient has a cavovarus foot with a prominent 1st and 5th metatarsal heads.  The left first metatarsal head is well-healed.  The callus was pared with a 10 blade knife.  Examination of the left fifth metatarsal head patient has a persistent Wagner grade 1 ulcer.  After informed consent a 10 blade knife was used to debride the ulcer back to healthy viable granulation tissue.  Hemostasis was obtained with silver  nitrate.  The ulcer was 3 cm in diameter after debridement.    Imaging: No results found. No images are attached to the encounter.  Labs: Lab Results  Component Value Date   HGBA1C 6.9 (H) 07/24/2022   HGBA1C 7.5 (H) 09/17/2018   HGBA1C 8.3 (H) 07/23/2018   ESRSEDRATE 31 (H) 02/14/2017   CRP 12.3 (H) 02/14/2017   REPTSTATUS 03/13/2019 FINAL 03/11/2019   CULT  03/11/2019    NO GROWTH Performed at [facility], [address] [place], [place] [address]      Lab Results  Component Value Date   ALBUMIN 3.4 (L) 07/24/2022   ALBUMIN 4.3 07/23/2022   ALBUMIN 3.9 10/22/2018   PREALBUMIN 13.8 (L) 02/15/2017    Lab Results   Component Value Date   MG 2.0 07/25/2022   No results found for: [facility]  Lab Results  Component Value Date   PREALBUMIN 13.8 (L) 02/15/2017      Latest Ref Rng & Units 07/25/2022    6:15 AM 07/24/2022    4:42 AM 07/23/2022    9:27 PM  CBC EXTENDED  WBC 4.0 - 10.5 K/uL 11.4  12.0  11.4   RBC 4.22 - 5.81 MIL/uL 4.54  4.81  5.46   Hemoglobin 13.0 - 17.0 g/dL 85.6  84.8  83.2   HCT 39.0 - 52.0 % 40.9  44.2  49.2   Platelets 150 - 400 K/uL 258  287  301   NEUT# 1.7 - 7.7 K/uL 6.8   7.2   Lymph# 0.7 - 4.0 K/uL 2.9   2.7      There is no height or weight on file to calculate BMI.  Orders:  No orders of the defined types were placed in this encounter.  No orders of the defined types were placed in this encounter.    Procedures: No procedures performed  Clinical Data: No additional findings.  ROS:  All other systems negative, except as noted in the HPI. Review of Systems  Objective: Vital Signs: There were no  vitals taken for this visit.  Specialty Comments:  No specialty comments available.  PMFS History: Patient Active Problem List   Diagnosis Date Noted   Acute ischemic stroke (HCC) 07/24/2022   CVA (cerebral vascular accident) (HCC) 07/24/2022   Alkaline phosphatase raised 11/15/2020   Atherosclerosis of coronary artery without angina pectoris 11/15/2020   Benign prostatic hyperplasia with lower urinary tract symptoms 11/15/2020   Hyperglycemia 11/15/2020   Late effects of cerebrovascular disease 11/15/2020   Long term (current) use of insulin  (HCC) 11/15/2020   Melena 11/15/2020   Microalbuminuria 11/15/2020   Personal history of other specified conditions 11/15/2020   Right toe amputee (HCC) 11/15/2020   Ulcer of left foot, limited to breakdown of skin (HCC) 11/15/2020   Vitamin D deficiency 11/15/2020   S/P BKA (below knee amputation) unilateral, right (HCC) 10/23/2018   Intermittent pain    Phantom limb pain (HCC)    Stage 3a chronic kidney  disease (CKD) (HCC)    Labile blood glucose    Other disorders of plasma-protein metabolism, not elsewhere classified 09/22/2018   Acute blood loss anemia    Hypoalbuminemia due to protein-calorie malnutrition (HCC)    Diabetes mellitus type 2 in nonobese [facility])    Postoperative pain    Neuropathic pain    Seizures (HCC)    Right below-knee amputee (HCC) 09/19/2018   Below knee amputation (HCC) 09/17/2018   Status post transmetatarsal amputation of foot, right (HCC) 07/23/2018   Osteomyelitis (HCC) 07/23/2018   TIA (transient ischemic attack) 10/30/2017   Medication monitoring encounter 03/28/2017   Status post amputation of toe of right foot (HCC) 03/05/2017   Subacute osteomyelitis, right ankle and foot (HCC)    Peripheral neuropathy 02/16/2017   Peripheral artery disease (HCC) 02/16/2017   Type 2 diabetes mellitus with diabetic foot ulcer (HCC) 02/14/2017   Diabetic foot infection (HCC) 02/14/2017   Type II diabetes mellitus with renal manifestations (HCC)    HLD (hyperlipidemia)    Tobacco abuse    CKD stage 2 due to type 2 diabetes mellitus (HCC)    CAD (coronary artery disease)    Seizure disorder (HCC)    Past Medical History:  Diagnosis Date   CAD (coronary artery disease)    CKD (chronic kidney disease), stage III (HCC)    Diabetes mellitus without complication (HCC)    Type II   History of DVT (deep vein thrombosis)    History of kidney stones    passed stones - no surgery   HLD (hyperlipidemia)    Myocardial infarction (HCC) 2009   Peripheral vascular disease (HCC)    right leg   Seizure (HCC)    last one 2015   Stroke (HCC) 2012, 10/2017   2012-speech was effected- has come back.10/2017- numbness of right side no taste right  side. 2012-   Tobacco abuse     Family History  Problem Relation Age of Onset   Diabetes Mellitus II Mother    Diabetes Mellitus II Father    Colon cancer Neg Hx    Esophageal cancer Neg Hx     Past Surgical History:  Procedure  Laterality Date   AMPUTATION Right 02/23/2017   Procedure: RIGHT FOOT 5TH RAY AMPUTATION;  Surgeon: Harden Jerona GAILS, MD;  Location: [facility] OR;  Service: Orthopedics;  Laterality: Right;   AMPUTATION Right 04/23/2018   Procedure: Right Great Toe Amputation;  Surgeon: Harden Jerona GAILS, MD;  Location: [facility] OR;  Service: Orthopedics;  Laterality: Right;   AMPUTATION Right 07/23/2018  Procedure: RIGHT TRANSMETATARSAL AMPUTATION, APPLY WOUND VAC;  Surgeon: Harden Jerona GAILS, MD;  Location: [facility] OR;  Service: Orthopedics;  Laterality: Right;   AMPUTATION Right 09/17/2018   Procedure: RIGHT AMPUTATION BELOW KNEE;  Surgeon: Harden Jerona GAILS, MD;  Location: [facility] OR;  Service: Orthopedics;  Laterality: Right;   APPLICATION OF WOUND VAC Right 09/17/2018   Procedure: Application Of Wound Vac;  Surgeon: Harden Jerona GAILS, MD;  Location: [facility] OR;  Service: Orthopedics;  Laterality: Right;   BELOW KNEE LEG AMPUTATION Right 09/17/2018   CORONARY ANGIOPLASTY  03/05/2008   stent   FOOT SURGERY     PERIPHERAL ARTERIAL STENT GRAFT Right 2014   leg 2 stents- clotted per pat   STENT PLACEMENT VASCULAR ([facility] HX)     Several in right leg   TONSILLECTOMY     TRANSMETATARSAL AMPUTATION Right 07/23/2018   Social History   Occupational History   Not on file  Tobacco Use   Smoking status: Light Smoker    Current packs/day: 0.25    Average packs/day: 0.3 packs/day for 15.0 years (3.8 ttl pk-yrs)    Types: Cigarettes   Smokeless tobacco: Never   Tobacco comments:    2 or 3 a day   Vaping Use   Vaping status: Never Used  Substance and Sexual Activity   Alcohol use: Not Currently   Drug use: No   Sexual activity: Not on file

## 2024-03-08 NOTE — Progress Notes (Signed)
 Subjective Patient ID: Curtis Bradford is a 63 y.o. male.  The following information was reviewed by members of the visit team:  Tobacco  Allergies  Meds  Med Hx  Surg Hx      Diarrhea    Curtis Bradford is a 63 y.o. male who presents to urgent care with complaint of persistent diarrhea.  Patient reports diarrhea for about 2 weeks on and off.  States [place] the end of [place].  While there developed severe diarrhea and abdominal cramping.  States it was so severe he can leave the room for whole day.  Once symptoms somewhat improved he was able to come home.  Ever since then he has had on and off diarrhea.  He has tried taking Pepto-Bismol which helped some.  He denies any significant abdominal pain but states  he has a lot of cramping right before he is about to have a bowel movement.  He denies any nausea or vomiting.  No blood in his stool or emesis.  No fever or chills.  He has been treating himself with Pepto-Bismol, no other medications tried.  In fact he states he has not had a bowel movement in 3 days but states he is worried is going to come back.  He states when it does come back it is usually pure liquid.  No recent antibiotics.  He is on Ozempic and states he has never had normal bowel movements since being on that but never anything this severe.  He did end up with C. difficile after hospitalization 5 years ago, and is worried about the same.  Objective  Physical Exam Constitutional:      Appearance: Normal appearance. He is normal weight.  HENT:     Head: Normocephalic and atraumatic.  Cardiovascular:     Rate and Rhythm: Normal rate.  Pulmonary:     Effort: Pulmonary effort is normal.  Abdominal:     General: Bowel sounds are normal. There is no distension.     Tenderness: There is no abdominal tenderness. There is no guarding or rebound.  Musculoskeletal:     Cervical back: Neck supple.  Skin:    General: Skin is warm and dry.   Neurological:     Mental Status: He is alert.  Psychiatric:        Mood and Affect: Mood normal.     Vitals:   03/08/24 1600 03/08/24 1612  BP: 154/80 143/75  Pulse: 90   Resp: 16   Temp: 97.7 F (36.5 C)   TempSrc: Temporal   SpO2: 97%   Weight: 84.8 kg (187 lb)   Height: 1.905 m (6' 3)      Assessment/Plan Diagnoses and all orders for this visit:  Diarrhea, unspecified type -     CBC with Differential -     Comprehensive Metabolic Panel -     Gastrointestinal Pathogen Profile, Stool, NAAT -     Magnesium  -     Clostridioides difficile Antigen and Toxin EIA   Patient with persistent on and off diarrhea for 2 weeks.  History of C. difficile several years ago.  Mildly hypertensive otherwise normal vital signs.  Abdomen completely nontender benign.  Will get labs today for further evaluation and would like to get a stool sample.  Patient will take a kit home and bring us  a stool sample to test for C. difficile and for pathogen panel send symptoms have been going on for several weeks  and with history of C. difficile in the past.  Otherwise appears well-hydrated, no distress, stable for discharge   Electronically signed: Tatyana Alexeyevna Kirichenko, PA-C 03/08/2024  4:23 PM

## 2024-03-12 ENCOUNTER — Ambulatory Visit (INDEPENDENT_AMBULATORY_CARE_PROVIDER_SITE_OTHER): Admitting: Orthopedic Surgery

## 2024-03-12 DIAGNOSIS — L97521 Non-pressure chronic ulcer of other part of left foot limited to breakdown of skin: Secondary | ICD-10-CM

## 2024-03-12 DIAGNOSIS — Z89511 Acquired absence of right leg below knee: Secondary | ICD-10-CM | POA: Diagnosis not present

## 2024-03-14 ENCOUNTER — Encounter: Payer: Self-pay | Admitting: Orthopedic Surgery

## 2024-03-14 NOTE — Progress Notes (Signed)
 Office Visit Note   Patient: Curtis Bradford           Date of Birth: 1961/03/21           MRN: [id number] Visit Date: 03/12/2024              Requested by: Valentin Skates, DO [address] [place],  [place] [address] PCP: Valentin Skates, DO  Chief Complaint  Patient presents with   Left Foot - Follow-up      HPI: Discussed the use of AI scribe software for clinical note transcription with the patient, who gave verbal consent to proceed.  History of Present Illness Esaiah Wanless is a 63 year old male with a right transtibial amputation who presents with concerns about his prosthetic foot and a left foot ulcer.  He reports a change in his prosthetic foot since his last visit a month ago. He describes a sensation of instability in the foot and ankle, noting that it feels weak and sometimes drags when walking. The prosthetic is approximately 62 years old, and he is concerned about the risk of falling. He is seeking evaluation for a new foot, ankle, socket, liner, and supplies, including a knee-high compression sock for his left lower extremity.  The ulcer has a thick callus with bleeding into it, which he noticed because it was 'getting really thick' and 'still getting wet.' He has been managing the ulcer with daily applications of Neosporin and a Band-Aid, which he believes helps keep it moist. He is concerned about the potential for infection and has been monitoring it closely.     Assessment & Plan: Visit Diagnoses:  1. Non-pressure chronic ulcer of other part of left foot limited to breakdown of skin (HCC)   2. 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 - Apply Neosporin and a Band-Aid daily. - Re-evaluate in four weeks.  Right transtibial amputation with prosthesis instability and liner  tear Prosthesis unstable with torn liner, increasing fall risk. Six years old, needs evaluation for replacement. - Refer to [facility] for evaluation of new foot, ankle, socket, and liner. - Assess for new prosthesis supplies.  Need for knee high compression sock, left lower extremity Requires knee high compression sock for left lower extremity. - Prescribe knee high compression sock for left lower extremity.      Follow-Up Instructions: Return in about 4 weeks (around 04/09/2024).   Ortho Exam  [name] alert, oriented, no adenopathy, well-dressed, normal affect, normal respiratory effort. Physical Exam EXTREMITIES: Right transtibular amputation with torn liner, unstable foot and ankle. Wagner grade 1 ulcer beneath left fifth metatarsal head, 2 cm diameter after debridement.  After informed consent a 10 blade knife is used to debride the skin and soft tissue back to healthy viable granulation tissue.  There is no exposed bone or tendon the healthy granulation tissue was touched with silver  nitrate.  [name] subsiding into his socket he has a torn liner.  Patient also has a broken foot and ankle.  Patient will need a new prosthesis.  [name] an existing right transtibial  amputee.  Patient's current comorbidities are not expected to impact the ability to function with the prescribed prosthesis. Patient verbally communicates a strong desire to use a prosthesis. Patient currently requires mobility aids to ambulate without a prosthesis.  Expects not to use mobility aids with a new prosthesis. [name] expected to resume or reach their K Level within 6 months. Patient was active before the amputation and independent with stairs, uneven terrain, varying cadence, and a community ambulator.  [name] a K3 level ambulator that spends a lot of time walking around on uneven terrain over obstacles, up and down stairs, and ambulates with a variable cadence.         Imaging: No  results found. No images are attached to the encounter.  Labs: Lab Results  Component Value Date   HGBA1C 6.9 (H) 07/24/2022   HGBA1C 7.5 (H) 09/17/2018   HGBA1C 8.3 (H) 07/23/2018   ESRSEDRATE 31 (H) 02/14/2017   CRP 12.3 (H) 02/14/2017   REPTSTATUS 03/13/2019 FINAL 03/11/2019   CULT  03/11/2019    NO GROWTH Performed at [facility], [address], [place], [place] [address]      Lab Results  Component Value Date   ALBUMIN 3.4 (L) 07/24/2022   ALBUMIN 4.3 07/23/2022   ALBUMIN 3.9 10/22/2018   PREALBUMIN 13.8 (L) 02/15/2017    Lab Results  Component Value Date   MG 2.0 07/25/2022   No results found for: [facility]  Lab Results  Component Value Date   PREALBUMIN 13.8 (L) 02/15/2017      Latest Ref Rng & Units 07/25/2022    6:15 AM 07/24/2022    4:42 AM 07/23/2022    9:27 PM  CBC EXTENDED  WBC 4.0 - 10.5 K/uL 11.4  12.0  11.4   RBC 4.22 - 5.81 MIL/uL 4.54  4.81  5.46   Hemoglobin 13.0 - 17.0 g/dL 85.6  84.8  83.2   HCT 39.0 - 52.0 % 40.9  44.2  49.2   Platelets 150 - 400 K/uL 258  287  301   NEUT# 1.7 - 7.7 K/uL 6.8   7.2   Lymph# 0.7 - 4.0 K/uL 2.9   2.7      There is no height or weight on file to calculate BMI.  Orders:  No orders of the defined types were placed in this encounter.  No orders of the defined types were placed in this encounter.    Procedures: No procedures performed  Clinical Data: No additional findings.  ROS:  All other systems negative, except as noted in the HPI. Review of Systems  Objective: Vital Signs: There were no vitals taken for this visit.  Specialty Comments:  No specialty comments available.  PMFS History: Patient Active Problem List   Diagnosis Date Noted   Acute ischemic stroke (HCC) 07/24/2022   CVA (cerebral vascular accident) (HCC) 07/24/2022   Alkaline phosphatase raised 11/15/2020   Atherosclerosis of coronary artery without angina pectoris 11/15/2020   Benign prostatic hyperplasia with lower  urinary tract symptoms 11/15/2020   Hyperglycemia 11/15/2020   Late effects of cerebrovascular disease 11/15/2020   Long term (current) use of insulin  (HCC) 11/15/2020   Melena 11/15/2020   Microalbuminuria 11/15/2020   Personal history of other specified conditions 11/15/2020   Right toe amputee (HCC) 11/15/2020   Ulcer of left foot, limited to breakdown of skin (HCC) 11/15/2020   Vitamin D deficiency 11/15/2020   S/P BKA (below knee amputation) unilateral, right (HCC) 10/23/2018   Intermittent pain    Phantom limb pain (HCC)    Stage 3a chronic kidney disease (CKD) (HCC)    Labile blood glucose    Other disorders of plasma-protein metabolism, not elsewhere classified 09/22/2018  Acute blood loss anemia    Hypoalbuminemia due to protein-calorie malnutrition (HCC)    Diabetes mellitus type 2 in nonobese [facility])    Postoperative pain    Neuropathic pain    Seizures (HCC)    Right below-knee amputee (HCC) 09/19/2018   Below knee amputation (HCC) 09/17/2018   Status post transmetatarsal amputation of foot, right (HCC) 07/23/2018   Osteomyelitis (HCC) 07/23/2018   TIA (transient ischemic attack) 10/30/2017   Medication monitoring encounter 03/28/2017   Status post amputation of toe of right foot (HCC) 03/05/2017   Subacute osteomyelitis, right ankle and foot (HCC)    Peripheral neuropathy 02/16/2017   Peripheral artery disease (HCC) 02/16/2017   Type 2 diabetes mellitus with diabetic foot ulcer (HCC) 02/14/2017   Diabetic foot infection (HCC) 02/14/2017   Type II diabetes mellitus with renal manifestations (HCC)    HLD (hyperlipidemia)    Tobacco abuse    CKD stage 2 due to type 2 diabetes mellitus (HCC)    CAD (coronary artery disease)    Seizure disorder (HCC)    Past Medical History:  Diagnosis Date   CAD (coronary artery disease)    CKD (chronic kidney disease), stage III (HCC)    Diabetes mellitus without complication (HCC)    Type II   History of DVT (deep vein  thrombosis)    History of kidney stones    passed stones - no surgery   HLD (hyperlipidemia)    Myocardial infarction (HCC) 2009   Peripheral vascular disease (HCC)    right leg   Seizure (HCC)    last one 2015   Stroke (HCC) 2012, 10/2017   2012-speech was effected- has come back.10/2017- numbness of right side no taste right  side. 2012-   Tobacco abuse     Family History  Problem Relation Age of Onset   Diabetes Mellitus II Mother    Diabetes Mellitus II Father    Colon cancer Neg Hx    Esophageal cancer Neg Hx     Past Surgical History:  Procedure Laterality Date   AMPUTATION Right 02/23/2017   Procedure: RIGHT FOOT 5TH RAY AMPUTATION;  Surgeon: Harden Jerona GAILS, MD;  Location: [facility] OR;  Service: Orthopedics;  Laterality: Right;   AMPUTATION Right 04/23/2018   Procedure: Right Great Toe Amputation;  Surgeon: Harden Jerona GAILS, MD;  Location: [facility] OR;  Service: Orthopedics;  Laterality: Right;   AMPUTATION Right 07/23/2018   Procedure: RIGHT TRANSMETATARSAL AMPUTATION, APPLY WOUND VAC;  Surgeon: Harden Jerona GAILS, MD;  Location: [facility] OR;  Service: Orthopedics;  Laterality: Right;   AMPUTATION Right 09/17/2018   Procedure: RIGHT AMPUTATION BELOW KNEE;  Surgeon: Harden Jerona GAILS, MD;  Location: [facility] OR;  Service: Orthopedics;  Laterality: Right;   APPLICATION OF WOUND VAC Right 09/17/2018   Procedure: Application Of Wound Vac;  Surgeon: Harden Jerona GAILS, MD;  Location: [facility] OR;  Service: Orthopedics;  Laterality: Right;   BELOW KNEE LEG AMPUTATION Right 09/17/2018   CORONARY ANGIOPLASTY  03/05/2008   stent   FOOT SURGERY     PERIPHERAL ARTERIAL STENT GRAFT Right 2014   leg 2 stents- clotted per pat   STENT PLACEMENT VASCULAR ([facility] HX)     Several in right leg   TONSILLECTOMY     TRANSMETATARSAL AMPUTATION Right 07/23/2018   Social History   Occupational History   Not on file  Tobacco Use   Smoking status: Light Smoker    Current packs/day: 0.25    Average packs/day: 0.3 packs/day for  15.0 years  (3.8 ttl pk-yrs)    Types: Cigarettes   Smokeless tobacco: Never   Tobacco comments:    2 or 3 a day   Vaping Use   Vaping status: Never Used  Substance and Sexual Activity   Alcohol use: Not Currently   Drug use: No   Sexual activity: Not on file

## 2024-04-08 ENCOUNTER — Encounter: Payer: Self-pay | Admitting: Family

## 2024-04-08 ENCOUNTER — Ambulatory Visit (INDEPENDENT_AMBULATORY_CARE_PROVIDER_SITE_OTHER): Admitting: Family

## 2024-04-08 DIAGNOSIS — L97521 Non-pressure chronic ulcer of other part of left foot limited to breakdown of skin: Secondary | ICD-10-CM

## 2024-04-08 NOTE — Progress Notes (Signed)
 Office Visit Note   Patient: Curtis Bradford           Date of Birth: Jan 07, 1961           MRN: [id number] Visit Date: 04/08/2024              Requested by: Valentin Skates, DO [address] [place],  [place] [address] PCP: Valentin Skates, DO  Chief Complaint  Patient presents with   Left Foot - Follow-up      HPI: The patient is a 63 year old gentleman who presents today in follow-up for Wagner grade 1 ulcer left foot with been debriding this monthly  Assessment & Plan: Visit Diagnoses: No diagnosis found.  Plan: Continue with current wound care and pressure offloading  Follow-Up Instructions: Return in about 29 days (around 05/07/2024).   Ortho Exam  [name] alert, oriented, no adenopathy, well-dressed, normal affect, normal respiratory effort. On examination left foot beneath the first metatarsal head there is Wagner grade 1 ulcer this is debrided with a 10 blade knife of nonviable tissue underlying open area is 3 mm in diameter with 1 mm of depth there is no active drainage or sign of infection    Imaging: No results found. No images are attached to the encounter.  Labs: Lab Results  Component Value Date   HGBA1C 6.9 (H) 07/24/2022   HGBA1C 7.5 (H) 09/17/2018   HGBA1C 8.3 (H) 07/23/2018   ESRSEDRATE 31 (H) 02/14/2017   CRP 12.3 (H) 02/14/2017   REPTSTATUS 03/13/2019 FINAL 03/11/2019   CULT  03/11/2019    NO GROWTH Performed at [facility], [address], [place], [place] [address]      Lab Results  Component Value Date   ALBUMIN 3.4 (L) 07/24/2022   ALBUMIN 4.3 07/23/2022   ALBUMIN 3.9 10/22/2018   PREALBUMIN 13.8 (L) 02/15/2017    Lab Results  Component Value Date   MG 2.0 07/25/2022   No results found for: [facility]  Lab Results  Component Value Date   PREALBUMIN 13.8 (L) 02/15/2017      Latest Ref Rng & Units 07/25/2022    6:15 AM 07/24/2022    4:42 AM 07/23/2022    9:27 PM  CBC EXTENDED  WBC 4.0 - 10.5 K/uL 11.4  12.0  11.4   RBC  4.22 - 5.81 MIL/uL 4.54  4.81  5.46   Hemoglobin 13.0 - 17.0 g/dL 85.6  84.8  83.2   HCT 39.0 - 52.0 % 40.9  44.2  49.2   Platelets 150 - 400 K/uL 258  287  301   NEUT# 1.7 - 7.7 K/uL 6.8   7.2   Lymph# 0.7 - 4.0 K/uL 2.9   2.7      There is no height or weight on file to calculate BMI.  Orders:  No orders of the defined types were placed in this encounter.  No orders of the defined types were placed in this encounter.    Procedures: No procedures performed  Clinical Data: No additional findings.  ROS:  All other systems negative, except as noted in the HPI. Review of Systems  Objective: Vital Signs: There were no vitals taken for this visit.  Specialty Comments:  No specialty comments available.  PMFS History: Patient Active Problem List   Diagnosis Date Noted   Acute ischemic stroke (HCC) 07/24/2022   CVA (cerebral vascular accident) (HCC) 07/24/2022   Alkaline phosphatase raised 11/15/2020   Atherosclerosis of coronary artery without angina pectoris 11/15/2020  Benign prostatic hyperplasia with lower urinary tract symptoms 11/15/2020   Hyperglycemia 11/15/2020   Late effects of cerebrovascular disease 11/15/2020   Long term (current) use of insulin  (HCC) 11/15/2020   Melena 11/15/2020   Microalbuminuria 11/15/2020   Personal history of other specified conditions 11/15/2020   Right toe amputee 11/15/2020   Ulcer of left foot, limited to breakdown of skin (HCC) 11/15/2020   Vitamin D deficiency 11/15/2020   S/P BKA (below knee amputation) unilateral, right (HCC) 10/23/2018   Intermittent pain    Phantom limb pain (HCC)    Stage 3a chronic kidney disease (CKD) (HCC)    Labile blood glucose    Other disorders of plasma-protein metabolism, not elsewhere classified 09/22/2018   Acute blood loss anemia    Hypoalbuminemia due to protein-calorie malnutrition    Diabetes mellitus type 2 in nonobese [facility])    Postoperative pain    Neuropathic pain    Seizures  (HCC)    Right below-knee amputee (HCC) 09/19/2018   Below knee amputation (HCC) 09/17/2018   Status post transmetatarsal amputation of foot, right (HCC) 07/23/2018   Osteomyelitis (HCC) 07/23/2018   TIA (transient ischemic attack) 10/30/2017   Medication monitoring encounter 03/28/2017   Status post amputation of toe of right foot 03/05/2017   Subacute osteomyelitis, right ankle and foot (HCC)    Peripheral neuropathy 02/16/2017   Peripheral artery disease 02/16/2017   Type 2 diabetes mellitus with diabetic foot ulcer (HCC) 02/14/2017   Diabetic foot infection (HCC) 02/14/2017   Type II diabetes mellitus with renal manifestations (HCC)    HLD (hyperlipidemia)    Tobacco abuse    CKD stage 2 due to type 2 diabetes mellitus (HCC)    CAD (coronary artery disease)    Seizure disorder (HCC)    Past Medical History:  Diagnosis Date   CAD (coronary artery disease)    CKD (chronic kidney disease), stage III (HCC)    Diabetes mellitus without complication (HCC)    Type II   History of DVT (deep vein thrombosis)    History of kidney stones    passed stones - no surgery   HLD (hyperlipidemia)    Myocardial infarction [facility]) 2009   Peripheral vascular disease    right leg   Seizure (HCC)    last one 2015   Stroke (HCC) 2012, 10/2017   2012-speech was effected- has come back.10/2017- numbness of right side no taste right  side. 2012-   Tobacco abuse     Family History  Problem Relation Age of Onset   Diabetes Mellitus II Mother    Diabetes Mellitus II Father    Colon cancer Neg Hx    Esophageal cancer Neg Hx     Past Surgical History:  Procedure Laterality Date   AMPUTATION Right 02/23/2017   Procedure: RIGHT FOOT 5TH RAY AMPUTATION;  Surgeon: Harden Jerona GAILS, MD;  Location[facility] OR;  Service: Orthopedics;  Laterality: Right;   AMPUTATION Right 04/23/2018   Procedure: Right Great Toe Amputation;  Surgeon: Harden Jerona GAILS, MD;  Location: [facility] OR;  Service: Orthopedics;  Laterality: Right;    AMPUTATION Right 07/23/2018   Procedure: RIGHT TRANSMETATARSAL AMPUTATION, APPLY WOUND VAC;  Surgeon: Harden Jerona GAILS, MD;  Location: [facility] OR;  Service: Orthopedics;  Laterality: Right;   AMPUTATION Right 09/17/2018   Procedure: RIGHT AMPUTATION BELOW KNEE;  Surgeon: Harden Jerona GAILS, MD;  Location: [facility] OR;  Service: Orthopedics;  Laterality: Right;   APPLICATION OF WOUND VAC Right 09/17/2018  Procedure: Application Of Wound Vac;  Surgeon: Harden Jerona GAILS, MD;  Location: [facility] OR;  Service: Orthopedics;  Laterality: Right;   BELOW KNEE LEG AMPUTATION Right 09/17/2018   CORONARY ANGIOPLASTY  03/05/2008   stent   FOOT SURGERY     PERIPHERAL ARTERIAL STENT GRAFT Right 2014   leg 2 stents- clotted per pat   STENT PLACEMENT VASCULAR ([facility] HX)     Several in right leg   TONSILLECTOMY     TRANSMETATARSAL AMPUTATION Right 07/23/2018   Social History   Occupational History   Not on file  Tobacco Use   Smoking status: Light Smoker    Current packs/day: 0.25    Average packs/day: 0.3 packs/day for 15.0 years (3.8 ttl pk-yrs)    Types: Cigarettes   Smokeless tobacco: Never   Tobacco comments:    2 or 3 a day   Vaping Use   Vaping status: Never Used  Substance and Sexual Activity   Alcohol use: Not Currently   Drug use: No   Sexual activity: Not on file

## 2024-04-09 ENCOUNTER — Ambulatory Visit: Admitting: Orthopedic Surgery

## 2024-04-27 ENCOUNTER — Encounter: Payer: Self-pay | Admitting: Radiology

## 2024-05-07 ENCOUNTER — Ambulatory Visit (INDEPENDENT_AMBULATORY_CARE_PROVIDER_SITE_OTHER): Admitting: Orthopedic Surgery

## 2024-05-07 DIAGNOSIS — Z89511 Acquired absence of right leg below knee: Secondary | ICD-10-CM | POA: Diagnosis not present

## 2024-05-07 DIAGNOSIS — L97521 Non-pressure chronic ulcer of other part of left foot limited to breakdown of skin: Secondary | ICD-10-CM

## 2024-05-11 ENCOUNTER — Encounter: Payer: Self-pay | Admitting: Orthopedic Surgery

## 2024-05-11 NOTE — Progress Notes (Signed)
 Office Visit Note   Patient: Curtis Bradford           Date of Birth: 1960/08/27           MRN: [id number] Visit Date: 05/07/2024              Requested by: Valentin Skates, DO [address] [place],  [place] [address] PCP: Valentin Skates, DO  Chief Complaint  Patient presents with   Left Foot - Wound Check      HPI: Discussed the use of AI scribe software for clinical note transcription with the patient, who gave verbal consent to proceed.  History of Present Illness [name] is a 63 year old male who presents with a Wagner grade one ulcer and prosthetic issues.  He has concerns regarding his right transtibial prosthesis, which is over 42 years old and has become seized and unrepairable. The patient reports that the foot and ankle component of his prosthesis is seized and not repairable. He is seeking a new foot and ankle for his prosthesis, as well as supplies.  He has a Wagner grade one ulcer on the left foot, specifically at the fifth metatarsal head. The ulcer has been present for some time, and he has been making progress over the last month or two. He experiences issues with the ulcer when wearing socks, as the talus sometimes pokes out, causing discomfort and the skin to peel back.  He currently pays $700 a month for insurance, which covers 80% of his medical expenses and prescriptions, with a $25 monthly cost for medications. He is concerned about potential changes in his insurance plan next year, which could increase his costs significantly.     Assessment & Plan: Visit Diagnoses:  1. 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 - Continue follow-up as needed.  Mechanical complication of right transtibial prosthesis (foot and ankle component) Prosthesis over  five years old, seized, non-repairable. Instability leads to near falls. New component needed. - Provided prescription for new foot and ankle component. - Documented need for new prosthesis for insurance.      Follow-Up Instructions: Return if symptoms worsen or fail to improve.   Ortho Exam  [name] alert, oriented, no adenopathy, well-dressed, normal affect, normal respiratory effort. Physical Exam MUSCULOSKELETAL: Ulcer on left foot measures 3 cm in diameter after debridement. Right transtibial prosthesis present.   [name] an existing right transtibial  amputee.  Patient's current comorbidities are not expected to impact the ability to function with the prescribed prosthesis. Patient verbally communicates a strong desire to use a prosthesis. Patient currently requires mobility aids to ambulate without a prosthesis.  Expects not to use mobility aids with a new prosthesis. [name] expected to resume or reach their K Level within 6 months. Patient was active before the amputation and independent with stairs, uneven terrain, varying cadence, and a community ambulator.  [name] a K3 level ambulator that spends a lot of time walking around on uneven terrain over obstacles, up and down stairs, and ambulates with a variable cadence.      Imaging: No results found. No images are attached to the encounter.  Labs: Lab Results  Component Value Date   HGBA1C 6.9 (H) 07/24/2022   HGBA1C 7.5 (H) 09/17/2018   HGBA1C 8.3 (H) 07/23/2018  ESRSEDRATE 31 (H) 02/14/2017   CRP 12.3 (H) 02/14/2017   REPTSTATUS 03/13/2019 FINAL 03/11/2019   CULT  03/11/2019    NO GROWTH Performed at [facility], [address] [place], [place] [address]      Lab Results  Component Value Date   ALBUMIN 3.4 (L) 07/24/2022   ALBUMIN 4.3 07/23/2022   ALBUMIN 3.9 10/22/2018   PREALBUMIN 13.8 (L) 02/15/2017    Lab Results  Component Value Date   MG 2.0 07/25/2022   No results  found for: [facility]  Lab Results  Component Value Date   PREALBUMIN 13.8 (L) 02/15/2017      Latest Ref Rng & Units 07/25/2022    6:15 AM 07/24/2022    4:42 AM 07/23/2022    9:27 PM  CBC EXTENDED  WBC 4.0 - 10.5 K/uL 11.4  12.0  11.4   RBC 4.22 - 5.81 MIL/uL 4.54  4.81  5.46   Hemoglobin 13.0 - 17.0 g/dL 85.6  84.8  83.2   HCT 39.0 - 52.0 % 40.9  44.2  49.2   Platelets 150 - 400 K/uL 258  287  301   NEUT# 1.7 - 7.7 K/uL 6.8   7.2   Lymph# 0.7 - 4.0 K/uL 2.9   2.7      There is no height or weight on file to calculate BMI.  Orders:  No orders of the defined types were placed in this encounter.  No orders of the defined types were placed in this encounter.    Procedures: No procedures performed  Clinical Data: No additional findings.  ROS:  All other systems negative, except as noted in the HPI. Review of Systems  Objective: Vital Signs: There were no vitals taken for this visit.  Specialty Comments:  No specialty comments available.  PMFS History: Patient Active Problem List   Diagnosis Date Noted   Acute ischemic stroke (HCC) 07/24/2022   CVA (cerebral vascular accident) (HCC) 07/24/2022   Alkaline phosphatase raised 11/15/2020   Atherosclerosis of coronary artery without angina pectoris 11/15/2020   Benign prostatic hyperplasia with lower urinary tract symptoms 11/15/2020   Hyperglycemia 11/15/2020   Late effects of cerebrovascular disease 11/15/2020   Long term (current) use of insulin  (HCC) 11/15/2020   Melena 11/15/2020   Microalbuminuria 11/15/2020   Personal history of other specified conditions 11/15/2020   Right toe amputee 11/15/2020   Ulcer of left foot, limited to breakdown of skin (HCC) 11/15/2020   Vitamin D deficiency 11/15/2020   S/P BKA (below knee amputation) unilateral, right (HCC) 10/23/2018   Intermittent pain    Phantom limb pain (HCC)    Stage 3a chronic kidney disease (CKD) (HCC)    Labile blood glucose    Other disorders of  plasma-protein metabolism, not elsewhere classified 09/22/2018   Acute blood loss anemia    Hypoalbuminemia due to protein-calorie malnutrition    Diabetes mellitus type 2 in nonobese [facility])    Postoperative pain    Neuropathic pain    Seizures (HCC)    Right below-knee amputee (HCC) 09/19/2018   Below knee amputation (HCC) 09/17/2018   Status post transmetatarsal amputation of foot, right (HCC) 07/23/2018   Osteomyelitis (HCC) 07/23/2018   TIA (transient ischemic attack) 10/30/2017   Medication monitoring encounter 03/28/2017   Status post amputation of toe of right foot 03/05/2017   Subacute osteomyelitis, right ankle and foot (HCC)    Peripheral neuropathy 02/16/2017   Peripheral artery disease 02/16/2017   Type 2 diabetes mellitus  with diabetic foot ulcer (HCC) 02/14/2017   Diabetic foot infection (HCC) 02/14/2017   Type II diabetes mellitus with renal manifestations (HCC)    HLD (hyperlipidemia)    Tobacco abuse    CKD stage 2 due to type 2 diabetes mellitus (HCC)    CAD (coronary artery disease)    Seizure disorder (HCC)    Past Medical History:  Diagnosis Date   CAD (coronary artery disease)    CKD (chronic kidney disease), stage III (HCC)    Diabetes mellitus without complication (HCC)    Type II   History of DVT (deep vein thrombosis)    History of kidney stones    passed stones - no surgery   HLD (hyperlipidemia)    Myocardial infarction [facility]) 2009   Peripheral vascular disease    right leg   Seizure (HCC)    last one 2015   Stroke (HCC) 2012, 10/2017   2012-speech was effected- has come back.10/2017- numbness of right side no taste right  side. 2012-   Tobacco abuse     Family History  Problem Relation Age of Onset   Diabetes Mellitus II Mother    Diabetes Mellitus II Father    Colon cancer Neg Hx    Esophageal cancer Neg Hx     Past Surgical History:  Procedure Laterality Date   AMPUTATION Right 02/23/2017   Procedure: RIGHT FOOT 5TH RAY AMPUTATION;   Surgeon: Harden Jerona GAILS, MD;  Location: [facility] OR;  Service: Orthopedics;  Laterality: Right;   AMPUTATION Right 04/23/2018   Procedure: Right Great Toe Amputation;  Surgeon: Harden Jerona GAILS, MD;  Location: [facility] OR;  Service: Orthopedics;  Laterality: Right;   AMPUTATION Right 07/23/2018   Procedure: RIGHT TRANSMETATARSAL AMPUTATION, APPLY WOUND VAC;  Surgeon: Harden Jerona GAILS, MD;  Location: [facility] OR;  Service: Orthopedics;  Laterality: Right;   AMPUTATION Right 09/17/2018   Procedure: RIGHT AMPUTATION BELOW KNEE;  Surgeon: Harden Jerona GAILS, MD;  Location: [facility] OR;  Service: Orthopedics;  Laterality: Right;   APPLICATION OF WOUND VAC Right 09/17/2018   Procedure: Application Of Wound Vac;  Surgeon: Harden Jerona GAILS, MD;  Location: [facility] OR;  Service: Orthopedics;  Laterality: Right;   BELOW KNEE LEG AMPUTATION Right 09/17/2018   CORONARY ANGIOPLASTY  03/05/2008   stent   FOOT SURGERY     PERIPHERAL ARTERIAL STENT GRAFT Right 2014   leg 2 stents- clotted per pat   STENT PLACEMENT VASCULAR ([facility] HX)     Several in right leg   TONSILLECTOMY     TRANSMETATARSAL AMPUTATION Right 07/23/2018   Social History   Occupational History   Not on file  Tobacco Use   Smoking status: Light Smoker    Current packs/day: 0.25    Average packs/day: 0.3 packs/day for 15.0 years (3.8 ttl pk-yrs)    Types: Cigarettes   Smokeless tobacco: Never   Tobacco comments:    2 or 3 a day   Vaping Use   Vaping status: Never Used  Substance and Sexual Activity   Alcohol use: Not Currently   Drug use: No   Sexual activity: Not on file

## 2024-06-04 ENCOUNTER — Ambulatory Visit: Admitting: Orthopedic Surgery

## 2024-06-04 DIAGNOSIS — Z89511 Acquired absence of right leg below knee: Secondary | ICD-10-CM | POA: Diagnosis not present

## 2024-06-04 DIAGNOSIS — L97521 Non-pressure chronic ulcer of other part of left foot limited to breakdown of skin: Secondary | ICD-10-CM

## 2024-06-08 ENCOUNTER — Encounter: Payer: Self-pay | Admitting: Orthopedic Surgery

## 2024-06-08 NOTE — Progress Notes (Signed)
 Office Visit Note   Patient: Curtis Bradford           Date of Birth: 26-Oct-1960           MRN: [id number] Visit Date: 06/04/2024              Requested by: Valentin Skates, DO [address] [place],  [place] [address] PCP: Valentin Skates, DO  Chief Complaint  Patient presents with   Left Foot - Follow-up      HPI: Discussed the use of AI scribe software for clinical note transcription with the patient, who gave verbal consent to proceed.  History of Present Illness He is a 63 year old male with a right transtibial amputation who presents for follow-up of a resolved ulcer and callus on the left foot.  He mentions a red line on the bottom of his big toe, resembling a varicose vein, which disappears when rubbed and then reappears.     Assessment & Plan: Visit Diagnoses:  1. 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 Non-pressure chronic ulcer of other part of left foot limited to breakdown of skin (HCC)     Plan: Assessment and Plan Assessment & Plan Callus of left foot, fifth metatarsal head Callus present over the fifth metatarsal head of the left foot. No open wounds. - Used a ten blade knife to pare the callus.  Right transtibial amputation, stable The right transtibial amputation is well-managed with no new issues. - Will re-evaluate in two months.      Follow-Up Instructions: Return in about 2 months (around 08/05/2024).   Ortho Exam  [name] alert, oriented, no adenopathy, well-dressed, normal affect, normal respiratory effort. Physical Exam EXTREMITIES: Ulcer and callus resolved at left first metatarsal head. Callus present over left fifth metatarsal head. Callus pared with no open wounds on left foot. Good epithelialization on left foot. Stable right transtibial amputation. Red line on bottom of left big toe, likely varicose vein.      Imaging: No results found. No images are attached to the encounter.  Labs: Lab Results   Component Value Date   HGBA1C 6.9 (H) 07/24/2022   HGBA1C 7.5 (H) 09/17/2018   HGBA1C 8.3 (H) 07/23/2018   ESRSEDRATE 31 (H) 02/14/2017   CRP 12.3 (H) 02/14/2017   REPTSTATUS 03/13/2019 FINAL 03/11/2019   CULT  03/11/2019    NO GROWTH Performed at [facility], [address] [place], [place] [address]      Lab Results  Component Value Date   ALBUMIN 3.4 (L) 07/24/2022   ALBUMIN 4.3 07/23/2022   ALBUMIN 3.9 10/22/2018   PREALBUMIN 13.8 (L) 02/15/2017    Lab Results  Component Value Date   MG 2.0 07/25/2022   No results found for: [facility]  Lab Results  Component Value Date   PREALBUMIN 13.8 (L) 02/15/2017      Latest Ref Rng & Units 07/25/2022    6:15 AM 07/24/2022    4:42 AM 07/23/2022    9:27 PM  CBC EXTENDED  WBC 4.0 - 10.5 K/uL 11.4  12.0  11.4   RBC 4.22 - 5.81 MIL/uL 4.54  4.81  5.46   Hemoglobin 13.0 - 17.0 g/dL 85.6  84.8  83.2   HCT 39.0 - 52.0 % 40.9  44.2  49.2   Platelets 150 - 400 K/uL 258  287  301   NEUT# 1.7 - 7.7 K/uL 6.8   7.2   Lymph# 0.7 - 4.0 K/uL  2.9   2.7      There is no height or weight on file to calculate BMI.  Orders:  No orders of the defined types were placed in this encounter.  No orders of the defined types were placed in this encounter.    Procedures: No procedures performed  Clinical Data: No additional findings.  ROS:  All other systems negative, except as noted in the HPI. Review of Systems  Objective: Vital Signs: There were no vitals taken for this visit.  Specialty Comments:  No specialty comments available.  PMFS History: Patient Active Problem List   Diagnosis Date Noted   Acute ischemic stroke (HCC) 07/24/2022   CVA (cerebral vascular accident) (HCC) 07/24/2022   Alkaline phosphatase raised 11/15/2020   Atherosclerosis of coronary artery without angina pectoris 11/15/2020   Benign prostatic hyperplasia with lower urinary tract symptoms 11/15/2020   Hyperglycemia 11/15/2020   Late effects  of cerebrovascular disease 11/15/2020   Long term (current) use of insulin  (HCC) 11/15/2020   Melena 11/15/2020   Microalbuminuria 11/15/2020   Personal history of other specified conditions 11/15/2020   Right toe amputee 11/15/2020   Ulcer of left foot, limited to breakdown of skin (HCC) 11/15/2020   Vitamin D deficiency 11/15/2020   S/P BKA (below knee amputation) unilateral, right (HCC) 10/23/2018   Intermittent pain    Phantom limb pain (HCC)    Stage 3a chronic kidney disease (CKD) (HCC)    Labile blood glucose    Other disorders of plasma-protein metabolism, not elsewhere classified 09/22/2018   Acute blood loss anemia    Hypoalbuminemia due to protein-calorie malnutrition    Diabetes mellitus type 2 in nonobese [facility])    Postoperative pain    Neuropathic pain    Seizures (HCC)    Right below-knee amputee (HCC) 09/19/2018   Below knee amputation (HCC) 09/17/2018   Status post transmetatarsal amputation of foot, right (HCC) 07/23/2018   Osteomyelitis (HCC) 07/23/2018   TIA (transient ischemic attack) 10/30/2017   Medication monitoring encounter 03/28/2017   Status post amputation of toe of right foot 03/05/2017   Subacute osteomyelitis, right ankle and foot (HCC)    Peripheral neuropathy 02/16/2017   Peripheral artery disease 02/16/2017   Type 2 diabetes mellitus with diabetic foot ulcer (HCC) 02/14/2017   Diabetic foot infection (HCC) 02/14/2017   Type II diabetes mellitus with renal manifestations (HCC)    HLD (hyperlipidemia)    Tobacco abuse    CKD stage 2 due to type 2 diabetes mellitus (HCC)    CAD (coronary artery disease)    Seizure disorder (HCC)    Past Medical History:  Diagnosis Date   CAD (coronary artery disease)    CKD (chronic kidney disease), stage III (HCC)    Diabetes mellitus without complication (HCC)    Type II   History of DVT (deep vein thrombosis)    History of kidney stones    passed stones - no surgery   HLD (hyperlipidemia)     Myocardial infarction [facility]) 2009   Peripheral vascular disease    right leg   Seizure (HCC)    last one 2015   Stroke (HCC) 2012, 10/2017   2012-speech was effected- has come back.10/2017- numbness of right side no taste right  side. 2012-   Tobacco abuse     Family History  Problem Relation Age of Onset   Diabetes Mellitus II Mother    Diabetes Mellitus II Father    Colon cancer Neg Hx  Esophageal cancer Neg Hx     Past Surgical History:  Procedure Laterality Date   AMPUTATION Right 02/23/2017   Procedure: RIGHT FOOT 5TH RAY AMPUTATION;  Surgeon: Harden Jerona GAILS, MD;  Location: [facility] OR;  Service: Orthopedics;  Laterality: Right;   AMPUTATION Right 04/23/2018   Procedure: Right Great Toe Amputation;  Surgeon: Harden Jerona GAILS, MD;  Location: [facility] OR;  Service: Orthopedics;  Laterality: Right;   AMPUTATION Right 07/23/2018   Procedure: RIGHT TRANSMETATARSAL AMPUTATION, APPLY WOUND VAC;  Surgeon: Harden Jerona GAILS, MD;  Location: [facility] OR;  Service: Orthopedics;  Laterality: Right;   AMPUTATION Right 09/17/2018   Procedure: RIGHT AMPUTATION BELOW KNEE;  Surgeon: Harden Jerona GAILS, MD;  Location: [facility] OR;  Service: Orthopedics;  Laterality: Right;   APPLICATION OF WOUND VAC Right 09/17/2018   Procedure: Application Of Wound Vac;  Surgeon: Harden Jerona GAILS, MD;  Location: [facility] OR;  Service: Orthopedics;  Laterality: Right;   BELOW KNEE LEG AMPUTATION Right 09/17/2018   CORONARY ANGIOPLASTY  03/05/2008   stent   FOOT SURGERY     PERIPHERAL ARTERIAL STENT GRAFT Right 2014   leg 2 stents- clotted per pat   STENT PLACEMENT VASCULAR ([facility] HX)     Several in right leg   TONSILLECTOMY     TRANSMETATARSAL AMPUTATION Right 07/23/2018   Social History   Occupational History   Not on file  Tobacco Use   Smoking status: Light Smoker    Current packs/day: 0.25    Average packs/day: 0.3 packs/day for 15.0 years (3.8 ttl pk-yrs)    Types: Cigarettes   Smokeless tobacco: Never   Tobacco comments:    2 or 3 a day    Vaping Use   Vaping status: Never Used  Substance and Sexual Activity   Alcohol use: Not Currently   Drug use: No   Sexual activity: Not on file

## 2024-07-15 ENCOUNTER — Other Ambulatory Visit (HOSPITAL_COMMUNITY): Payer: Self-pay | Admitting: Neurology

## 2024-07-15 MED ORDER — STUDY - LIBREXIA-STROKE - MILVEXIAN 25 MG OR PLACEBO TABLET (PI-SETHI): 1.0000 | ORAL_TABLET | Freq: Two times a day (BID) | ORAL | 0 refills | Status: AC

## 2024-08-06 ENCOUNTER — Ambulatory Visit: Admitting: Orthopedic Surgery
